# Patient Record
Sex: Female | Born: 1935
Health system: Southern US, Community
[De-identification: ages and names within clinical notes are randomized; demographics above are authoritative.]

## PROBLEM LIST (undated history)

## (undated) DIAGNOSIS — Z87442 Personal history of urinary calculi: Secondary | ICD-10-CM

## (undated) DIAGNOSIS — E785 Hyperlipidemia, unspecified: Secondary | ICD-10-CM

## (undated) DIAGNOSIS — J40 Bronchitis, not specified as acute or chronic: Secondary | ICD-10-CM

## (undated) DIAGNOSIS — I10 Essential (primary) hypertension: Secondary | ICD-10-CM

## (undated) DIAGNOSIS — H9190 Unspecified hearing loss, unspecified ear: Secondary | ICD-10-CM

## (undated) DIAGNOSIS — Z8673 Personal history of transient ischemic attack (TIA), and cerebral infarction without residual deficits: Secondary | ICD-10-CM

## (undated) DIAGNOSIS — T8859XA Other complications of anesthesia, initial encounter: Secondary | ICD-10-CM

## (undated) DIAGNOSIS — M1990 Unspecified osteoarthritis, unspecified site: Secondary | ICD-10-CM

## (undated) DIAGNOSIS — G4730 Sleep apnea, unspecified: Secondary | ICD-10-CM

## (undated) DIAGNOSIS — I82409 Acute embolism and thrombosis of unspecified deep veins of unspecified lower extremity: Secondary | ICD-10-CM

## (undated) DIAGNOSIS — E119 Type 2 diabetes mellitus without complications: Secondary | ICD-10-CM

## (undated) DIAGNOSIS — K219 Gastro-esophageal reflux disease without esophagitis: Secondary | ICD-10-CM

## (undated) HISTORY — PX: COLONOSCOPY: SHX174

## (undated) HISTORY — PX: CHOLECYSTECTOMY: SHX55

## (undated) HISTORY — DX: Bronchitis, not specified as acute or chronic: J40

## (undated) HISTORY — PX: APPENDECTOMY: SHX54

## (undated) HISTORY — PX: ABDOMINAL HYSTERECTOMY: SHX81

## (undated) HISTORY — PX: CATARACT EXTRACTION, BILATERAL: SHX1313

---

## 2018-12-07 ENCOUNTER — Encounter (HOSPITAL_COMMUNITY): Payer: Self-pay

## 2018-12-07 ENCOUNTER — Other Ambulatory Visit: Payer: Self-pay

## 2018-12-07 ENCOUNTER — Emergency Department (HOSPITAL_COMMUNITY): Payer: Medicare HMO

## 2018-12-07 ENCOUNTER — Inpatient Hospital Stay (HOSPITAL_COMMUNITY)
Admission: EM | Admit: 2018-12-07 | Discharge: 2018-12-10 | DRG: 683 | Disposition: A | Discharge status: Home / Self Care | Payer: Medicare HMO | Attending: Internal Medicine | Admitting: Internal Medicine

## 2018-12-07 DIAGNOSIS — E1169 Type 2 diabetes mellitus with other specified complication: Secondary | ICD-10-CM | POA: Diagnosis present

## 2018-12-07 DIAGNOSIS — T383X5A Adverse effect of insulin and oral hypoglycemic [antidiabetic] drugs, initial encounter: Secondary | ICD-10-CM | POA: Diagnosis present

## 2018-12-07 DIAGNOSIS — R112 Nausea with vomiting, unspecified: Secondary | ICD-10-CM

## 2018-12-07 DIAGNOSIS — I10 Essential (primary) hypertension: Secondary | ICD-10-CM | POA: Diagnosis not present

## 2018-12-07 DIAGNOSIS — K6389 Other specified diseases of intestine: Secondary | ICD-10-CM | POA: Diagnosis not present

## 2018-12-07 DIAGNOSIS — Z7982 Long term (current) use of aspirin: Secondary | ICD-10-CM

## 2018-12-07 DIAGNOSIS — K5730 Diverticulosis of large intestine without perforation or abscess without bleeding: Secondary | ICD-10-CM | POA: Diagnosis not present

## 2018-12-07 DIAGNOSIS — K5289 Other specified noninfective gastroenteritis and colitis: Secondary | ICD-10-CM | POA: Diagnosis not present

## 2018-12-07 DIAGNOSIS — K228 Other specified diseases of esophagus: Secondary | ICD-10-CM | POA: Diagnosis not present

## 2018-12-07 DIAGNOSIS — N179 Acute kidney failure, unspecified: Secondary | ICD-10-CM | POA: Diagnosis present

## 2018-12-07 DIAGNOSIS — R011 Cardiac murmur, unspecified: Secondary | ICD-10-CM | POA: Diagnosis not present

## 2018-12-07 DIAGNOSIS — Z7951 Long term (current) use of inhaled steroids: Secondary | ICD-10-CM | POA: Diagnosis not present

## 2018-12-07 DIAGNOSIS — K5641 Fecal impaction: Secondary | ICD-10-CM | POA: Diagnosis present

## 2018-12-07 DIAGNOSIS — I358 Other nonrheumatic aortic valve disorders: Secondary | ICD-10-CM | POA: Diagnosis present

## 2018-12-07 DIAGNOSIS — Z8673 Personal history of transient ischemic attack (TIA), and cerebral infarction without residual deficits: Secondary | ICD-10-CM | POA: Diagnosis not present

## 2018-12-07 DIAGNOSIS — R197 Diarrhea, unspecified: Secondary | ICD-10-CM | POA: Diagnosis present

## 2018-12-07 DIAGNOSIS — Z7984 Long term (current) use of oral hypoglycemic drugs: Secondary | ICD-10-CM | POA: Diagnosis not present

## 2018-12-07 DIAGNOSIS — Z8249 Family history of ischemic heart disease and other diseases of the circulatory system: Secondary | ICD-10-CM

## 2018-12-07 DIAGNOSIS — E872 Acidosis: Secondary | ICD-10-CM | POA: Diagnosis not present

## 2018-12-07 DIAGNOSIS — K529 Noninfective gastroenteritis and colitis, unspecified: Secondary | ICD-10-CM

## 2018-12-07 DIAGNOSIS — D649 Anemia, unspecified: Secondary | ICD-10-CM | POA: Diagnosis present

## 2018-12-07 DIAGNOSIS — K210 Gastro-esophageal reflux disease with esophagitis: Secondary | ICD-10-CM | POA: Diagnosis not present

## 2018-12-07 DIAGNOSIS — Z79899 Other long term (current) drug therapy: Secondary | ICD-10-CM | POA: Diagnosis not present

## 2018-12-07 DIAGNOSIS — Z87891 Personal history of nicotine dependence: Secondary | ICD-10-CM | POA: Diagnosis not present

## 2018-12-07 DIAGNOSIS — Z20828 Contact with and (suspected) exposure to other viral communicable diseases: Secondary | ICD-10-CM | POA: Diagnosis not present

## 2018-12-07 DIAGNOSIS — R1084 Generalized abdominal pain: Secondary | ICD-10-CM

## 2018-12-07 DIAGNOSIS — K449 Diaphragmatic hernia without obstruction or gangrene: Secondary | ICD-10-CM | POA: Diagnosis not present

## 2018-12-07 DIAGNOSIS — N200 Calculus of kidney: Secondary | ICD-10-CM | POA: Diagnosis not present

## 2018-12-07 DIAGNOSIS — E785 Hyperlipidemia, unspecified: Secondary | ICD-10-CM

## 2018-12-07 HISTORY — DX: Type 2 diabetes mellitus without complications: E11.9

## 2018-12-07 HISTORY — DX: Hyperlipidemia, unspecified: E78.5

## 2018-12-07 HISTORY — DX: Essential (primary) hypertension: I10

## 2018-12-07 LAB — COMPREHENSIVE METABOLIC PANEL
ALT: 18 U/L (ref 0–44)
AST: 26 U/L (ref 15–41)
Albumin: 3.5 g/dL (ref 3.5–5.0)
Alkaline Phosphatase: 60 U/L (ref 38–126)
Anion gap: 21 — ABNORMAL HIGH (ref 5–15)
BUN: 44 mg/dL — ABNORMAL HIGH (ref 8–23)
CO2: 21 mmol/L — ABNORMAL LOW (ref 22–32)
Calcium: 9.3 mg/dL (ref 8.9–10.3)
Chloride: 97 mmol/L — ABNORMAL LOW (ref 98–111)
Creatinine, Ser: 1.54 mg/dL — ABNORMAL HIGH (ref 0.44–1.00)
GFR calc Af Amer: 35 mL/min — ABNORMAL LOW (ref >60)
GFR calc non Af Amer: 30 mL/min — ABNORMAL LOW (ref >60)
Glucose, Bld: 102 mg/dL — ABNORMAL HIGH (ref 70–99)
Potassium: 4 mmol/L (ref 3.5–5.1)
Sodium: 139 mmol/L (ref 135–145)
Total Bilirubin: 0.9 mg/dL (ref 0.3–1.2)
Total Protein: 6.7 g/dL (ref 6.5–8.1)

## 2018-12-07 LAB — URINALYSIS, ROUTINE W REFLEX MICROSCOPIC
Bilirubin Urine: NEGATIVE
Glucose, UA: NEGATIVE mg/dL
Hgb urine dipstick: NEGATIVE
Ketones, ur: 20 mg/dL — AB
Leukocytes,Ua: NEGATIVE
Nitrite: NEGATIVE
Protein, ur: NEGATIVE mg/dL
Specific Gravity, Urine: 1.016 (ref 1.005–1.030)
pH: 5 (ref 5.0–8.0)

## 2018-12-07 LAB — GLUCOSE, CAPILLARY
Glucose-Capillary: 61 mg/dL — ABNORMAL LOW (ref 70–99)
Glucose-Capillary: 88 mg/dL (ref 70–99)

## 2018-12-07 LAB — CBC WITH DIFFERENTIAL/PLATELET
Abs Immature Granulocytes: 0.12 10*3/uL — ABNORMAL HIGH (ref 0.00–0.07)
Basophils Absolute: 0 10*3/uL (ref 0.0–0.1)
Basophils Relative: 0 %
Eosinophils Absolute: 0 10*3/uL (ref 0.0–0.5)
Eosinophils Relative: 0 %
HCT: 37.6 % (ref 36.0–46.0)
Hemoglobin: 11.8 g/dL — ABNORMAL LOW (ref 12.0–15.0)
Immature Granulocytes: 1 %
Lymphocytes Relative: 6 %
Lymphs Abs: 0.9 10*3/uL (ref 0.7–4.0)
MCH: 28.3 pg (ref 26.0–34.0)
MCHC: 31.4 g/dL (ref 30.0–36.0)
MCV: 90.2 fL (ref 80.0–100.0)
Monocytes Absolute: 0.8 10*3/uL (ref 0.1–1.0)
Monocytes Relative: 6 %
Neutro Abs: 11.9 10*3/uL — ABNORMAL HIGH (ref 1.7–7.7)
Neutrophils Relative %: 87 %
Platelets: 407 10*3/uL — ABNORMAL HIGH (ref 150–400)
RBC: 4.17 MIL/uL (ref 3.87–5.11)
RDW: 14.7 % (ref 11.5–15.5)
WBC: 13.7 10*3/uL — ABNORMAL HIGH (ref 4.0–10.5)
nRBC: 0 % (ref 0.0–0.2)

## 2018-12-07 LAB — SARS CORONAVIRUS 2 BY RT PCR (HOSPITAL ORDER, PERFORMED IN ~~LOC~~ HOSPITAL LAB): SARS Coronavirus 2: NEGATIVE

## 2018-12-07 LAB — CBG MONITORING, ED: Glucose-Capillary: 57 mg/dL — ABNORMAL LOW (ref 70–99)

## 2018-12-07 LAB — LACTIC ACID, PLASMA
Lactic Acid, Venous: 2.7 mmol/L (ref 0.5–1.9)
Lactic Acid, Venous: 1.4 mmol/L (ref 0.5–1.9)

## 2018-12-07 LAB — LIPASE, BLOOD: Lipase: 19 U/L (ref 11–51)

## 2018-12-07 LAB — TROPONIN I (HIGH SENSITIVITY): Troponin I (High Sensitivity): 10 ng/L (ref <18)

## 2018-12-07 MED ORDER — SODIUM CHLORIDE 0.9 % IV SOLN
INTRAVENOUS | Status: AC
  Administered 2018-12-07 (×2): via INTRAVENOUS

## 2018-12-07 MED ORDER — ONDANSETRON HCL 4 MG/2ML IJ SOLN
4.0000 mg | Freq: Once | INTRAMUSCULAR | Status: AC
  Administered 2018-12-07: 4 mg via INTRAVENOUS
  Filled 2018-12-07: qty 2

## 2018-12-07 MED ORDER — METRONIDAZOLE IN NACL 5-0.79 MG/ML-% IV SOLN
500.0000 mg | Freq: Once | INTRAVENOUS | Status: AC
  Administered 2018-12-07: 500 mg via INTRAVENOUS
  Filled 2018-12-07: qty 100

## 2018-12-07 MED ORDER — FAMOTIDINE 20 MG PO TABS
10.0000 mg | ORAL_TABLET | Freq: Two times a day (BID) | ORAL | Status: DC
  Administered 2018-12-07 – 2018-12-10 (×6): 10 mg via ORAL
  Filled 2018-12-07 (×5): qty 1

## 2018-12-07 MED ORDER — SODIUM CHLORIDE 0.9 % IV SOLN
2.0000 g | Freq: Once | INTRAVENOUS | Status: AC
  Administered 2018-12-07: 2 g via INTRAVENOUS
  Filled 2018-12-07: qty 20

## 2018-12-07 MED ORDER — SODIUM CHLORIDE 0.9 % IV BOLUS
1000.0000 mL | Freq: Once | INTRAVENOUS | Status: AC
  Administered 2018-12-07: 1000 mL via INTRAVENOUS

## 2018-12-07 MED ORDER — ENOXAPARIN SODIUM 40 MG/0.4ML ~~LOC~~ SOLN
40.0000 mg | Freq: Every day | SUBCUTANEOUS | Status: DC
  Administered 2018-12-07 – 2018-12-09 (×3): 40 mg via SUBCUTANEOUS
  Filled 2018-12-07 (×3): qty 0.4

## 2018-12-07 MED ORDER — IOHEXOL 300 MG/ML  SOLN
30.0000 mL | Freq: Once | INTRAMUSCULAR | Status: AC | PRN
  Administered 2018-12-07: 30 mL via ORAL

## 2018-12-07 MED ORDER — MINERAL OIL RE ENEM
1.0000 | ENEMA | Freq: Once | RECTAL | Status: AC
  Administered 2018-12-07: 23:00:00 1 via RECTAL
  Filled 2018-12-07: qty 1

## 2018-12-07 MED ORDER — ACETAMINOPHEN 325 MG PO TABS: 650.0000 mg | ORAL_TABLET | Freq: Four times a day (QID) | ORAL | Status: DC | PRN

## 2018-12-07 MED ORDER — IOHEXOL 300 MG/ML  SOLN: 30.0000 mL | Freq: Once | INTRAMUSCULAR | Status: DC | PRN

## 2018-12-07 MED ORDER — ONDANSETRON HCL 4 MG/2ML IJ SOLN
4.0000 mg | Freq: Four times a day (QID) | INTRAMUSCULAR | Status: DC | PRN
  Administered 2018-12-09: 4 mg via INTRAVENOUS
  Filled 2018-12-07: qty 2

## 2018-12-07 MED ORDER — ACETAMINOPHEN 650 MG RE SUPP: 650.0000 mg | Freq: Four times a day (QID) | RECTAL | Status: DC | PRN

## 2018-12-07 MED ORDER — INSULIN ASPART 100 UNIT/ML ~~LOC~~ SOLN
0.0000 [IU] | SUBCUTANEOUS | Status: DC
  Administered 2018-12-08: 2 [IU] via SUBCUTANEOUS
  Administered 2018-12-08: 5 [IU] via SUBCUTANEOUS
  Administered 2018-12-09 (×2): 1 [IU] via SUBCUTANEOUS
  Administered 2018-12-09: 2 [IU] via SUBCUTANEOUS
  Administered 2018-12-09 – 2018-12-10 (×2): 1 [IU] via SUBCUTANEOUS

## 2018-12-07 NOTE — ED Notes (Signed)
Report given to Shaw, [place]

## 2018-12-07 NOTE — ED Notes (Signed)
Pt daughter informed RN that pt had bowel blockage in Feb 2020, pt has hx of C. Diff x 2 years ago. Pt moved to the area 2 weeks ago from [place].

## 2018-12-07 NOTE — Progress Notes (Signed)
Patient arrived to unit @ 2126, blood glucose checked and glucose was 61. Patient asymptomatic, hypoglycemic protocol order placed and patient was given orange juice. Glucose rechecked and patient currently has CBG of 88. Patient is stable, scheduled CBG monitoring Q4. Patient will continue to be monitored. Dawson Bills, RN

## 2018-12-07 NOTE — ED Triage Notes (Signed)
Pt states she has had diarrhea since Saturday, 7/11. Pt states she has been vomiting since yesterday.

## 2018-12-07 NOTE — ED Notes (Signed)
Patient transported to CT 

## 2018-12-07 NOTE — ED Provider Notes (Signed)
[facility] DEPT Provider Note   CSN: [id number] Arrival date & time: 12/07/18  1144     History   Chief Complaint Chief Complaint  Patient presents with  . Emesis  . Diarrhea    HPI Stacey Armstrong is a 85 y.o. female.     The history is provided by the patient and medical records. No language interpreter was used.  Abdominal Pain Pain location:  Generalized Pain quality: aching, cramping and pressure   Pain radiates to:  Does not radiate Pain severity:  Severe Onset quality:  Gradual Duration:  4 days Timing:  Constant Progression:  Waxing and waning Chronicity:  New Context: previous surgery (appy years ago)   Relieved by:  Nothing Worsened by:  Nothing Ineffective treatments:  None tried Associated symptoms: chills, cough, diarrhea, fatigue, nausea and vomiting   Associated symptoms: no chest pain, no constipation, no dysuria, no fever and no shortness of breath   Risk factors: being elderly     Past Medical History:  Diagnosis Date  . Diabetes ([place])   . Hypertension     There are no active problems to display for this patient.   History reviewed. No pertinent surgical history.   OB History   No obstetric history on file.      Home Medications    Prior to Admission medications   Not on File    Family History History reviewed. No pertinent family history.  Social History Social History   Tobacco Use  . Smoking status: Not on file  Substance Use Topics  . Alcohol use: Not on file  . Drug use: Not on file     Allergies   Penicillins   Review of Systems Review of Systems  Constitutional: Positive for chills and fatigue. Negative for diaphoresis and fever.  HENT: Negative for congestion and rhinorrhea.   Respiratory: Positive for cough. Negative for chest tightness, shortness of breath, wheezing and stridor.   Cardiovascular: Negative for chest pain, palpitations and leg swelling.  Gastrointestinal:  Positive for abdominal pain, diarrhea, nausea and vomiting. Negative for constipation.  Genitourinary: Negative for dysuria, flank pain and frequency.  Musculoskeletal: Negative for back pain, neck pain and neck stiffness.  Skin: Negative for rash and wound.  Neurological: Negative for light-headedness and headaches.  Psychiatric/Behavioral: Negative for agitation.  All other systems reviewed and are negative.    Physical Exam Updated Vital Signs BP (!) 154/63 (BP Location: Right Arm)   Pulse 95   Temp 98.6 F (37 C) (Oral)   Resp 18   SpO2 98%   Physical Exam Vitals signs and nursing note reviewed.  Constitutional:      General: She is not in acute distress.    Appearance: She is well-developed. She is not ill-appearing, toxic-appearing or diaphoretic.  HENT:     Head: Normocephalic and atraumatic.     Nose: No congestion or rhinorrhea.  Eyes:     Conjunctiva/sclera: Conjunctivae normal.     Pupils: Pupils are equal, round, and reactive to light.  Neck:     Musculoskeletal: Neck supple. No muscular tenderness.  Cardiovascular:     Rate and Rhythm: Normal rate and regular rhythm.     Pulses: Normal pulses.     Heart sounds: No murmur.  Pulmonary:     Effort: Pulmonary effort is normal. No respiratory distress.     Breath sounds: Normal breath sounds. No wheezing, rhonchi or rales.  Chest:     Chest wall: No tenderness.  Abdominal:     General: Abdomen is flat.     Palpations: Abdomen is soft.     Tenderness: There is abdominal tenderness. There is no right CVA tenderness, left CVA tenderness, guarding or rebound.  Musculoskeletal:        General: No tenderness.     Right lower leg: No edema.     Left lower leg: No edema.  Skin:    General: Skin is warm and dry.     Capillary Refill: Capillary refill takes less than 2 seconds.  Neurological:     General: No focal deficit present.     Mental Status: She is alert.  Psychiatric:        Mood and Affect: Mood normal.       ED Treatments / Results  Labs (all labs ordered are listed, but only abnormal results are displayed) Labs Reviewed  LACTIC ACID, PLASMA - Abnormal; Notable for the following components:      Result Value   Lactic Acid, Venous 2.7 (*)    All other components within normal limits  CBC WITH DIFFERENTIAL/PLATELET - Abnormal; Notable for the following components:   WBC 13.7 (*)    Hemoglobin 11.8 (*)    Platelets 407 (*)    Neutro Abs 11.9 (*)    Abs Immature Granulocytes 0.12 (*)    All other components within normal limits  COMPREHENSIVE METABOLIC PANEL - Abnormal; Notable for the following components:   Chloride 97 (*)    CO2 21 (*)    Glucose, Bld 102 (*)    BUN 44 (*)    Creatinine, Ser 1.54 (*)    GFR calc non Af Amer 30 (*)    GFR calc Af Amer 35 (*)    Anion gap 21 (*)    All other components within normal limits  URINALYSIS, ROUTINE W REFLEX MICROSCOPIC - Abnormal; Notable for the following components:   Ketones, ur 20 (*)    All other components within normal limits  GLUCOSE, CAPILLARY - Abnormal; Notable for the following components:   Glucose-Capillary 61 (*)    All other components within normal limits  CBG MONITORING, ED - Abnormal; Notable for the following components:   Glucose-Capillary 57 (*)    All other components within normal limits  SARS CORONAVIRUS 2 (HOSPITAL ORDER, [facility])  URINE CULTURE  C DIFFICILE QUICK SCREEN W PCR REFLEX  GASTROINTESTINAL PANEL BY PCR, STOOL (REPLACES STOOL CULTURE)  LACTIC ACID, PLASMA  LIPASE, BLOOD  COMPREHENSIVE METABOLIC PANEL  CBC  LACTIC ACID, PLASMA  TROPONIN I (HIGH SENSITIVITY)    EKG None  Radiology Ct Abdomen Pelvis Wo Contrast  Result Date: 12/07/2018 CLINICAL DATA:  Nausea, vomiting, diarrhea for 4 days. History Clostridium difficile, prior intestinal blockage. EXAM: CT ABDOMEN AND PELVIS WITHOUT CONTRAST TECHNIQUE: Multidetector CT imaging of the abdomen and pelvis  was performed following the standard protocol without IV contrast. COMPARISON:  None. FINDINGS: Lower chest: Bandlike areas of basilar atelectasis and/or scarring. Cardiac size is enlarged. Extensive coronary calcifications are present. Mitral annular calcifications are present as well. Hepatobiliary: No concerning liver lesions. Post cholecystectomy. Prominence of biliary tree, likely related to senescent change and post cholecystectomy reservoir effect. Pancreas: Diffuse fatty replacement of the pancreas. No pancreatic ductal dilatation. No concerning pancreatic lesions. Spleen: Normal in size without focal abnormality. Adrenals/Urinary Tract: Bilateral cortical thinning, incompletely assessed on enhanced CT. Coarse 9 mm calcification in the upper pole right kidney. No obstructive urolithiasis. No hydronephrosis. Mild  symmetric bilateral perinephric stranding is nonspecific and may be age related. Stomach/Bowel: Mild distal esophageal thickening, nonspecific. Moderate hiatal hernia. Small bowel is unremarkable. Enteric contrast passes to the level of the proximal ileum. Pancolonic diverticulosis. Marked distension of the distal sigmoid and rectal vault with inspissated fecal material, this tool ball measures up to 15 cm in maximum transaxial dimension. Associated mural thickening of the distal sigmoid and rectum with faint adjacent fat stranding. Vascular/Lymphatic: Aortic atherosclerosis. No enlarged abdominal or pelvic lymph nodes. Reproductive: Uterus appears surgically absent. No concerning adnexal lesions. Other: No free fluid or free gas. No abdominal wall hernia. Minimal posterior body wall edema. Musculoskeletal: Multilevel discogenic and facet degenerative changes of the imaged thoracolumbar spine. No suspicious osseous lesions. IMPRESSION: Marked distension of the distal sigmoid and rectal vault with inspissated fecal material, consistent with fecal impaction. Associated mural thickening of the distal  sigmoid and rectum with faint adjacent fat stranding, suggestive of stercoral colitis. Colonic diverticulosis without evidence of active diverticulitis. Bilateral cortical thinning of the kidneys. Nonobstructing right nephrolithiasis. Moderate hiatal hernia. Mild nonspecific thickening of the distal esophagus, correlate with features of esophagitis or sequela from recent emesis. These results were called by telephone at the time of interpretation on 12/07/2018 at 7:08 pm to Dr. Marda Stalker , who verbally acknowledged these results. Electronically Signed   By: Lovena Le M.D.   On: 12/07/2018 19:21    Procedures Fecal disimpaction  Date/Time: 12/07/2018 10:48 PM Performed by: Courtney Paris, MD Authorized by: Courtney Paris, MD  Consent: Verbal consent obtained. Risks and benefits: risks, benefits and alternatives were discussed Consent given by: patient Imaging studies: imaging studies available Patient identity confirmed: verbally with patient and arm band Time out: Immediately prior to procedure a "time out" was called to verify the correct patient, procedure, equipment, support staff and site/side marked as required. Local anesthesia used: no  Anesthesia: Local anesthesia used: no  Sedation: Patient sedated: no  Patient tolerance: patient tolerated the procedure well with no immediate complications Comments: Unsuccessful disimpaction attempt.    (including critical care time)  Medications Ordered in ED Medications  iohexol (OMNIPAQUE) 300 MG/ML solution 30 mL ( Intravenous Canceled Entry 12/07/18 1810)  enoxaparin (LOVENOX) injection 40 mg (40 mg Subcutaneous Given 12/07/18 2235)  0.9 %  sodium chloride infusion ( Intravenous New Bag/Given 12/07/18 2144)  acetaminophen (TYLENOL) tablet 650 mg (has no administration in time range)    Or  acetaminophen (TYLENOL) suppository 650 mg (has no administration in time range)  insulin aspart (novoLOG) injection 0-9  Units (0 Units Subcutaneous Not Given 12/07/18 2234)  ondansetron (ZOFRAN) injection 4 mg (has no administration in time range)  famotidine (PEPCID) tablet 10 mg (10 mg Oral Given 12/07/18 2235)  sodium chloride 0.9 % bolus 1,000 mL (0 mLs Intravenous Stopped 12/07/18 1501)  ondansetron (ZOFRAN) injection 4 mg (4 mg Intravenous Given 12/07/18 1314)  iohexol (OMNIPAQUE) 300 MG/ML solution 30 mL (30 mLs Oral Contrast Given 12/07/18 1600)  cefTRIAXone (ROCEPHIN) 2 g in sodium chloride 0.9 % 100 mL IVPB (2 g Intravenous New Bag/Given 12/07/18 2007)    And  metroNIDAZOLE (FLAGYL) IVPB 500 mg (500 mg Intravenous New Bag/Given 12/07/18 2102)  mineral oil enema 1 enema (1 enema Rectal Given 12/07/18 2235)    Stacey Armstrong was evaluated in Emergency Department on 12/07/2018 for the symptoms described in the history of present illness. She was evaluated in the context of the global COVID-19 pandemic, which necessitated consideration that the patient might be  at risk for infection with the SARS-CoV-2 virus that causes COVID-19. Institutional protocols and algorithms that pertain to the evaluation of patients at risk for COVID-19 are in a state of rapid change based on information released by regulatory bodies including the CDC and federal and state organizations. These policies and algorithms were followed during the patient's care in the ED.  CRITICAL CARE Performed by: Gwenyth Allegra Tegeler Total critical care time: 35 minutes Critical care time was exclusive of separately billable procedures and treating other patients. Critical care was necessary to treat or prevent imminent or life-threatening deterioration. Critical care was time spent personally by me on the following activities: development of treatment plan with patient and/or surrogate as well as nursing, discussions with consultants, evaluation of patient's response to treatment, examination of patient, obtaining history from patient or surrogate, ordering  and performing treatments and interventions, ordering and review of laboratory studies, ordering and review of radiographic studies, pulse oximetry and re-evaluation of patient's condition.   Initial Impression / Assessment and Plan / ED Course  I have reviewed the triage vital signs and the nursing notes.  Pertinent labs & imaging results that were available during my care of the patient were reviewed by me and considered in my medical decision making (see chart for details).        Stacey Armstrong is a 85 y.o. female with a past medical history significant for hypertension diabetes who presents with nausea, vomiting, diarrhea, abdominal pain, and cough.  Patient reports that for the last several days she is been having symptoms.  She reports that 4 days of diarrhea is been numerous.  It is nonbloody.  She reports since yesterday she did have vomiting has not kept any fluids down.  She reports she is having abdominal pain is been worsening in the interim that she describes as severe.  She has never had this pain before.  She reports she has had fatigue and feeling drained but has had no documented fevers at home.  She does report she is had some chills.  She reports has had some nonproductive cough.  She denies chest pain or shortness of breath.  She lives alone and denies any known coronavirus contacts.  She denies other complaints at this time.  On exam, abdomen is tender to palpation.  Bowel sounds were appreciated.  No CVA or flank tenderness.  Lungs clear and chest was nontender.  Patient was nauseous as I was examining her.  Suspect viral gastroenteritis given the nausea vomiting, diarrhea, bowel discomfort however with her tenderness on exam, will obtain CT imaging to rule out other pathology at this time.  Will get urinalysis as well as chest x-ray and will check her for coronavirus given the ongoing current pandemic and a possible that she may need admission if she is intolerant of p.o.   Initially, her blood pressure was around 789 systolic which appears to be less than her baseline.  She will be given fluids and remain n.p.o. initially.  Anticipate reassessment.  7:43 PM CT scan shows concern for development of likely stercoral colitis in the setting of large stool impaction.  No evidence of perforation at this time.  Labs showed no UTI, lactic acid improving after fluids.  Coronavirus test negative.  Patient does have a leukocytosis and mild anemia.  Lipase normal.  Creatinine elevated 1.5.  Manual disimpaction was attempted and unfortunately could not get a large amount of stool out.  The large stool ball is farther proximally then could  easily be disimpacted.  Due to her continued impaction, the development of stercoral colitis, her leukocytosis, lactic acidosis, elevated creatinine, and continued abdominal discomfort, patient will be given antibiotics and admitted to the hospital.  Spoke with daughter who is the healthcare power attorney who agreed with this plan of care.  Patient reports she has taken Keflex in the past so after speaking with pharmacy composition made to give a cephalosporin and Flagyl for antibiotic treatment for the stercoral colitis.  Patient will be admitted for further management.   Final Clinical Impressions(s) / ED Diagnoses   Final diagnoses:  Generalized abdominal pain  Colitis  Diarrhea, unspecified type  Nausea and vomiting, intractability of vomiting not specified, unspecified vomiting type    ED Discharge Orders    None      Clinical Impression: 1. Generalized abdominal pain   2. Colitis   3. Diarrhea, unspecified type   4. Nausea and vomiting, intractability of vomiting not specified, unspecified vomiting type     Disposition: Admit  This note was prepared with assistance of Dragon voice recognition software. Occasional wrong-word or sound-a-like substitutions may have occurred due to the inherent limitations of voice  recognition software.     Tegeler, Gwenyth Allegra, MD 12/07/18 2249

## 2018-12-07 NOTE — H&P (Addendum)
TRH H&P    Patient Demographics:    Stacey Armstrong, is a 85 y.o. female  MRN: [id number]  DOB - 14-Jul-1927  Admit Date - 12/07/2018  Referring MD/NP/PA: Gerald Stabs Tegeler  Outpatient Primary MD for the patient is Patient, No Pcp Per No PCP, just moved from [facility]  Patient coming from:  home  Chief complaint-  diarrhea   HPI:    Stacey Armstrong  is a 85 y.o. female, w hypertension, hyperlipidemia, dm2, h/o TIA?,  C. Diff circa 2018/2019, apparently presents with diarrhea, loose stool since Saturday.  Pt having multiple episodes 3-4 per day.  Pt noted n/v, for the past 1-2 days.  No hematemesis. Slight lower abdominal cramping per pt.    Pt denies fever, chills, cough, cp, palp, sob, brbpr, dysuria, hematuria.  Pt states had colonoscopy in the remote past and was negative.    Pt presented due to diarrhea.   In ED T 98.6, P 102,  curerntly 86,  Bp 154/63  Pox 97% on RA  CT abd/ pelvis IMPRESSION: Marked distension of the distal sigmoid and rectal vault with inspissated fecal material, consistent with fecal impaction. Associated mural thickening of the distal sigmoid and rectum with faint adjacent fat stranding, suggestive of stercoral colitis.  Colonic diverticulosis without evidence of active diverticulitis.  Bilateral cortical thinning of the kidneys. Nonobstructing right nephrolithiasis.  Moderate hiatal hernia.  Mild nonspecific thickening of the distal esophagus, correlate with features of esophagitis or sequela from recent emesis.  Lactic acid 2.7   Wbc 13.7, hgb 11.8, Plt 407 Na 139, K 4.0  Bun 44, Creatinine 1.54 Hco3 21, AG 21 Ast 26, Alt 18 Lipase 19 Urinalysis negative  covid 19 negative  Pt received rocephin 2gm iv and flagyl $RemoveBe'500mg'lvxbUbKgv$  iv while in ED.   Pt will be admitted for n/v, diarrhea, and fecal impaction and mild ARF, and lactic acidosis secondary to metformin.    Review  of systems:    In addition to the HPI above,  No Fever-chills, No Headache, No changes with Vision or hearing, No problems swallowing food or Liquids, No Chest pain, Cough or Shortness of Breath,   No Blood in stool or Urine, No dysuria, No new skin rashes or bruises, No new joints pains-aches,  No new weakness, tingling, numbness in any extremity, No recent weight gain or loss, No polyuria, polydypsia or polyphagia, No significant Mental Stressors.  All other systems reviewed and are negative.    Past History of the following :    Past Medical History:  Diagnosis Date   Diabetes ([place])    Hyperlipidemia    Hypertension       Past Surgical History:  Procedure Laterality Date   APPENDECTOMY     CHOLECYSTECTOMY        Social History:      Social History   Tobacco Use   Smoking status: Former Smoker   Smokeless tobacco: Never Used  Substance Use Topics   Alcohol use: Not Currently       Family History :  Family History  Problem Relation Age of Onset   CAD Maternal Grandmother    Pneumonia Maternal Grandfather        Home Medications:   Prior to Admission medications   Medication Sig Start Date End Date Taking? Authorizing Provider  amitriptyline (ELAVIL) 25 MG tablet Take 25 mg by mouth at bedtime.   Yes [provider]  amLODipine (NORVASC) 5 MG tablet Take 5 mg by mouth daily.   Yes [provider]  aspirin EC 81 MG tablet Take 81 mg by mouth daily.   Yes [provider]  atorvastatin (LIPITOR) 20 MG tablet Take 20 mg by mouth daily.   Yes [provider]  bisoprolol-hydrochlorothiazide (ZIAC) 2.5-6.25 MG tablet Take 1 tablet by mouth daily.   Yes [provider]  fluticasone (FLONASE) 50 MCG/ACT nasal spray Place 1 spray into both nostrils 2 (two) times a day.   Yes [provider]  glipiZIDE (GLUCOTROL XL) 10 MG 24 hr tablet Take 10 mg by mouth daily with breakfast.   Yes  [provider]  ibuprofen (ADVIL) 200 MG tablet Take 200 mg by mouth every 6 (six) hours as needed for moderate pain.   Yes [provider]  losartan (COZAAR) 100 MG tablet Take 100 mg by mouth daily.   Yes [provider]  metFORMIN (GLUCOPHAGE) 1000 MG tablet Take 1,000 mg by mouth 2 (two) times daily with a meal.   Yes [provider]  Multiple Vitamins-Minerals (CENTRUM SILVER 50+WOMEN) TABS Take 1 tablet by mouth daily.   Yes [provider]  Omega-3 Fatty Acids (FISH OIL) 1200 MG CAPS Take 1,000 mg by mouth 2 (two) times a day.   Yes [provider]  OVER THE COUNTER MEDICATION Take 1 tablet by mouth daily. **Probiotic**   Yes [provider]  Polyethyl Glycol-Propyl Glycol (SYSTANE ULTRA OP) Place 1 drop into both eyes as needed (dry eyes).   Yes [provider]  polyethylene glycol (MIRALAX / GLYCOLAX) 17 g packet Take 17 g by mouth as needed for mild constipation.   Yes [provider]  VITAMIN D, CHOLECALCIFEROL, PO Take 2,000 Units by mouth daily.   Yes [provider]     Allergies:     Allergies  Allergen Reactions   Penicillins Other (See Comments)    Unknown to pt daughter      Physical Exam:   Vitals  Blood pressure (!) 142/43, pulse 86, temperature 98.6 F (37 C), temperature source Oral, resp. rate 17, SpO2 97 %.  1.  General: axoxo3  2. Psychiatric: euthymic  3. Neurologic: cn2-12 intact, reflexes 2+ symmetric, diffuse with no clonus, motor 5/5 in all 4 ext  4. HEENMT:  Anicteric, pupils 1.36mm symmetric, direct consensual, intact  5. Respiratory : CTAB  6. Cardiovascular : rrr s1, s2, 2/6 sem rusb with radiation to next, also slight murmer at apex  7. Gastrointestinal:  Abd: soft, obese, nt, nd, +bs  8. Skin:  Ext: no c/c/e,  No rash  9.Musculoskeletal:  Good ROM  No adenopathy    Data Review:    CBC Recent Labs  Lab 12/07/18 1232  WBC 13.7*    HGB 11.8*  HCT 37.6  PLT 407*  MCV 90.2  MCH 28.3  MCHC 31.4  RDW 14.7  LYMPHSABS 0.9  MONOABS 0.8  EOSABS 0.0  BASOSABS 0.0   ------------------------------------------------------------------------------------------------------------------  Results for orders placed or performed during the hospital encounter of 12/07/18 (from the past 48 hour(s))  Lactic acid, plasma     Status: Abnormal   Collection Time: 12/07/18 12:32 PM  Result Value Ref Range   Lactic Acid, Venous 2.7 (HH) 0.5 - 1.9 mmol/L    Comment: CRITICAL RESULT CALLED TO, READ BACK BY AND VERIFIED WITHVernon Prey RN AT 4098 12/07/18 MULLINS,T Performed at [facility], [place] [address], [place], [place] [address]   CBC with Differential     Status: Abnormal   Collection Time: 12/07/18 12:32 PM  Result Value Ref Range   WBC 13.7 (H) 4.0 - 10.5 K/uL   RBC 4.17 3.87 - 5.11 MIL/uL   Hemoglobin 11.8 (L) 12.0 - 15.0 g/dL   HCT 37.6 36.0 - 46.0 %   MCV 90.2 80.0 - 100.0 fL   MCH 28.3 26.0 - 34.0 pg   MCHC 31.4 30.0 - 36.0 g/dL   RDW 14.7 11.5 - 15.5 %   Platelets 407 (H) 150 - 400 K/uL   nRBC 0.0 0.0 - 0.2 %   Neutrophils Relative % 87 %   Neutro Abs 11.9 (H) 1.7 - 7.7 K/uL   Lymphocytes Relative 6 %   Lymphs Abs 0.9 0.7 - 4.0 K/uL   Monocytes Relative 6 %   Monocytes Absolute 0.8 0.1 - 1.0 K/uL   Eosinophils Relative 0 %   Eosinophils Absolute 0.0 0.0 - 0.5 K/uL   Basophils Relative 0 %   Basophils Absolute 0.0 0.0 - 0.1 K/uL   Immature Granulocytes 1 %   Abs Immature Granulocytes 0.12 (H) 0.00 - 0.07 K/uL    Comment: Performed at [facility], [place] [address] [place], [place] [address]  Comprehensive metabolic panel     Status: Abnormal   Collection Time: 12/07/18 12:32 PM  Result Value Ref Range   Sodium 139 135 - 145 mmol/L   Potassium 4.0 3.5 - 5.1 mmol/L   Chloride 97 (L) 98 - 111 mmol/L   CO2 21 (L) 22 - 32 mmol/L   Glucose, Bld 102 (H) 70 - 99 mg/dL   BUN 44  (H) 8 - 23 mg/dL   Creatinine, Ser 1.54 (H) 0.44 - 1.00 mg/dL   Calcium 9.3 8.9 - 10.3 mg/dL   Total Protein 6.7 6.5 - 8.1 g/dL   Albumin 3.5 3.5 - 5.0 g/dL   AST 26 15 - 41 U/L   ALT 18 0 - 44 U/L   Alkaline Phosphatase 60 38 - 126 U/L   Total Bilirubin 0.9 0.3 - 1.2 mg/dL   GFR calc non Af Amer 30 (L) >60 mL/min   GFR calc Af Amer 35 (L) >60 mL/min   Anion gap 21 (H) 5 - 15    Comment: REPEATED TO VERIFY Performed at [place] [address] [place], [place] [address]   Lipase, blood     Status: None   Collection Time: 12/07/18 12:32 PM  Result Value Ref Range   Lipase 19 11 - 51 U/L    Comment: Performed at [facility], [place] [address] [place], [place] [address]  SARS Coronavirus 2 (CEPHEID - Performed in [place] hospital lab), Hosp Order     Status: None   Collection Time: 12/07/18  1:11 PM   Specimen: Nasopharyngeal Swab  Result Value Ref Range   SARS Coronavirus 2 NEGATIVE NEGATIVE    Comment: (NOTE) If result is NEGATIVE SARS-CoV-2 target nucleic acids are NOT DETECTED. The SARS-CoV-2 RNA is generally detectable in upper and lower  respiratory specimens during the  acute phase of infection. The lowest  concentration of SARS-CoV-2 viral copies this assay can detect is 250  copies / mL. A negative result does not preclude SARS-CoV-2 infection  and should not be used as the sole basis for treatment or other  patient management decisions.  A negative result may occur with  improper specimen collection / handling, submission of specimen other  than nasopharyngeal swab, presence of viral mutation(s) within the  areas targeted by this assay, and inadequate number of viral copies  (<250 copies / mL). A negative result must be combined with clinical  observations, patient history, and epidemiological information. If result is POSITIVE SARS-CoV-2 target nucleic acids are DETECTED. The SARS-CoV-2 RNA is generally detectable in upper  and lower  respiratory specimens dur ing the acute phase of infection.  Positive  results are indicative of active infection with SARS-CoV-2.  Clinical  correlation with patient history and other diagnostic information is  necessary to determine patient infection status.  Positive results do  not rule out bacterial infection or co-infection with other viruses. If result is PRESUMPTIVE POSTIVE SARS-CoV-2 nucleic acids MAY BE PRESENT.   A presumptive positive result was obtained on the submitted specimen  and confirmed on repeat testing.  While 2019 novel coronavirus  (SARS-CoV-2) nucleic acids may be present in the submitted sample  additional confirmatory testing may be necessary for epidemiological  and / or clinical management purposes  to differentiate between  SARS-CoV-2 and other Sarbecovirus currently known to infect humans.  If clinically indicated additional testing with an alternate test  methodology [phone]) is advised. The SARS-CoV-2 RNA is generally  detectable in upper and lower respiratory sp ecimens during the acute  phase of infection. The expected result is Negative. Fact Sheet for Patients:  BoilerBrush.com.cy Fact Sheet for Healthcare Providers: https://pope.com/ This test is not yet approved or cleared by the [place] FDA and has been authorized for detection and/or diagnosis of SARS-CoV-2 by FDA under an Emergency Use Authorization (EUA).  This EUA will remain in effect (meaning this test can be used) for the duration of the COVID-19 declaration under Section 564(b)(1) of the Act, 21 U.S.C. section 360bbb-3(b)(1), unless the authorization is terminated or revoked sooner. Performed at [facility], [address], [place], [place] [address]   Lactic acid, plasma     Status: None   Collection Time: 12/07/18  2:55 PM  Result Value Ref Range   Lactic Acid, Venous 1.4 0.5 - 1.9 mmol/L     Comment: Performed at [facility], [address] [place], [place] [address]  Urinalysis, Routine w reflex microscopic     Status: Abnormal   Collection Time: 12/07/18  5:54 PM  Result Value Ref Range   Color, Urine YELLOW YELLOW   APPearance CLEAR CLEAR   Specific Gravity, Urine 1.016 1.005 - 1.030   pH 5.0 5.0 - 8.0   Glucose, UA NEGATIVE NEGATIVE mg/dL   Hgb urine dipstick NEGATIVE NEGATIVE   Bilirubin Urine NEGATIVE NEGATIVE   Ketones, ur 20 (A) NEGATIVE mg/dL   Protein, ur NEGATIVE NEGATIVE mg/dL   Nitrite NEGATIVE NEGATIVE   Leukocytes,Ua NEGATIVE NEGATIVE    Comment: Performed at [facility], [address], [place], [place] [address]  CBG monitoring, ED     Status: Abnormal   Collection Time: 12/07/18  8:44 PM  Result Value Ref Range   Glucose-Capillary 57 (L) 70 - 99 mg/dL    Chemistries  Recent Labs  Lab  12/07/18 1232  NA 139  K 4.0  CL 97*  CO2 21*  GLUCOSE 102*  BUN 44*  CREATININE 1.54*  CALCIUM 9.3  AST 26  ALT 18  ALKPHOS 60  BILITOT 0.9   ------------------------------------------------------------------------------------------------------------------  ------------------------------------------------------------------------------------------------------------------ GFR: CrCl cannot be calculated (Unknown ideal weight.). Liver Function Tests: Recent Labs  Lab 12/07/18 1232  AST 26  ALT 18  ALKPHOS 60  BILITOT 0.9  PROT 6.7  ALBUMIN 3.5   Recent Labs  Lab 12/07/18 1232  LIPASE 19   No results for input(s): AMMONIA in the last 168 hours. Coagulation Profile: No results for input(s): INR, PROTIME in the last 168 hours. Cardiac Enzymes: No results for input(s): CKTOTAL, CKMB, CKMBINDEX, TROPONINI in the last 168 hours. BNP (last 3 results) No results for input(s): PROBNP in the last 8760 hours. HbA1C: No results for input(s): HGBA1C in the last 72 hours. CBG: Recent Labs  Lab 12/07/18 2044    GLUCAP 57*   Lipid Profile: No results for input(s): CHOL, HDL, LDLCALC, TRIG, CHOLHDL, LDLDIRECT in the last 72 hours. Thyroid Function Tests: No results for input(s): TSH, T4TOTAL, FREET4, T3FREE, THYROIDAB in the last 72 hours. Anemia Panel: No results for input(s): VITAMINB12, FOLATE, FERRITIN, TIBC, IRON, RETICCTPCT in the last 72 hours.  --------------------------------------------------------------------------------------------------------------- Urine analysis:    Component Value Date/Time   COLORURINE YELLOW 12/07/2018 1754   APPEARANCEUR CLEAR 12/07/2018 1754   LABSPEC 1.016 12/07/2018 1754   PHURINE 5.0 12/07/2018 1754   GLUCOSEU NEGATIVE 12/07/2018 1754   HGBUR NEGATIVE 12/07/2018 1754   BILIRUBINUR NEGATIVE 12/07/2018 1754   KETONESUR 20 (A) 12/07/2018 1754   PROTEINUR NEGATIVE 12/07/2018 1754   NITRITE NEGATIVE 12/07/2018 1754   LEUKOCYTESUR NEGATIVE 12/07/2018 1754      Imaging Results:    Ct Abdomen Pelvis Wo Contrast  Result Date: 12/07/2018 CLINICAL DATA:  Nausea, vomiting, diarrhea for 4 days. History Clostridium difficile, prior intestinal blockage. EXAM: CT ABDOMEN AND PELVIS WITHOUT CONTRAST TECHNIQUE: Multidetector CT imaging of the abdomen and pelvis was performed following the standard protocol without IV contrast. COMPARISON:  None. FINDINGS: Lower chest: Bandlike areas of basilar atelectasis and/or scarring. Cardiac size is enlarged. Extensive coronary calcifications are present. Mitral annular calcifications are present as well. Hepatobiliary: No concerning liver lesions. Post cholecystectomy. Prominence of biliary tree, likely related to senescent change and post cholecystectomy reservoir effect. Pancreas: Diffuse fatty replacement of the pancreas. No pancreatic ductal dilatation. No concerning pancreatic lesions. Spleen: Normal in size without focal abnormality. Adrenals/Urinary Tract: Bilateral cortical thinning, incompletely assessed on enhanced CT.  Coarse 9 mm calcification in the upper pole right kidney. No obstructive urolithiasis. No hydronephrosis. Mild symmetric bilateral perinephric stranding is nonspecific and may be age related. Stomach/Bowel: Mild distal esophageal thickening, nonspecific. Moderate hiatal hernia. Small bowel is unremarkable. Enteric contrast passes to the level of the proximal ileum. Pancolonic diverticulosis. Marked distension of the distal sigmoid and rectal vault with inspissated fecal material, this tool ball measures up to 15 cm in maximum transaxial dimension. Associated mural thickening of the distal sigmoid and rectum with faint adjacent fat stranding. Vascular/Lymphatic: Aortic atherosclerosis. No enlarged abdominal or pelvic lymph nodes. Reproductive: Uterus appears surgically absent. No concerning adnexal lesions. Other: No free fluid or free gas. No abdominal wall hernia. Minimal posterior body wall edema. Musculoskeletal: Multilevel discogenic and facet degenerative changes of the imaged thoracolumbar spine. No suspicious osseous lesions. IMPRESSION: Marked distension of the distal sigmoid and rectal vault with inspissated fecal material, consistent with fecal impaction. Associated mural  thickening of the distal sigmoid and rectum with faint adjacent fat stranding, suggestive of stercoral colitis. Colonic diverticulosis without evidence of active diverticulitis. Bilateral cortical thinning of the kidneys. Nonobstructing right nephrolithiasis. Moderate hiatal hernia. Mild nonspecific thickening of the distal esophagus, correlate with features of esophagitis or sequela from recent emesis. These results were called by telephone at the time of interpretation on 12/07/2018 at 7:08 pm to Dr. Marda Stalker , who verbally acknowledged these results. Electronically Signed   By: Lovena Le M.D.   On: 12/07/2018 19:21        Assessment & Plan:    Active Problems:   Fecal impaction (HCC)   Type 2 diabetes mellitus  with hyperlipidemia (HCC)   Essential hypertension   Diarrhea   Nausea and vomiting   ARF (acute renal failure) (HCC)  Diarrhea,  Likely secondary to fecal impaction.   ddx C. Diff (pt has history of C. Diff) Check stool for C. Diff, GI pathogen panel Hold Metformin-> could contribute to diarrhea  Fecal impaction w stercoral colitis Mineral oil enema Cont Miralax  RN to try to disempact  N/v Check CXR r/o aspiration Check trop I, 12 lead ekg zofran $RemoveBe'4mg'JCrAzOGND$  iv q6h prn  ARF Hold Losartan STOP Ibuprofen Hydrate with ns at 26mL per hour x 12  Hours Check cmp in am  Dm2 STOP Metformin due to ARF, and lactic acidosis Hold Glipizide fsbs q4h, ISS  Hypertension Hold Losartan Cont Bisoprolol-hydrochlorothiazide Cont Amlodipine $RemoveBeforeD'5mg'XnZHFuCnMhRRkp$  po qday  Hyperlipidemia, h/o TIA Cont Lipitor $RemoveBefo'20mg'ihjOzDkScAa$  po qhs Cont Aspirin $RemoveBefo'81mg'ZhgmSiQcGHy$  po qday  ? Gerd/ eosphagitis on CT scan Start Pepcid $RemoveBefo'10mg'uvWmckyYVxy$  po bid  Constipation Cont Miralax 17gm po qday  Cardiac murmer Check cardiac echo   DVT Prophylaxis-   Lovenox - SCDs   AM Labs Ordered, also please review Full Orders  Family Communication: Admission, patients condition and plan of care including tests being ordered have been discussed with the patient  who indicate understanding and agree with the plan and Code Status.  Code Status:  FULL CODE,  Notified daughter Ms. Justice regarding patient admission to [facility]  Admission status: Observation : Based on patients clinical presentation and evaluation of above clinical data, I have made determination that patient meets observation criteria at this time.  Depending upon results of stool studies as well as how fast she improves clnically, may require change to inpatient admission.   Time spent in minutes : 70 minutes   Jani Gravel M.D on 12/07/2018 at 8:56 PM

## 2018-12-08 ENCOUNTER — Inpatient Hospital Stay (HOSPITAL_COMMUNITY): Payer: Medicare HMO

## 2018-12-08 DIAGNOSIS — K210 Gastro-esophageal reflux disease with esophagitis: Secondary | ICD-10-CM | POA: Diagnosis not present

## 2018-12-08 DIAGNOSIS — K5641 Fecal impaction: Secondary | ICD-10-CM | POA: Diagnosis not present

## 2018-12-08 DIAGNOSIS — D649 Anemia, unspecified: Secondary | ICD-10-CM | POA: Diagnosis not present

## 2018-12-08 DIAGNOSIS — Z7951 Long term (current) use of inhaled steroids: Secondary | ICD-10-CM | POA: Diagnosis not present

## 2018-12-08 DIAGNOSIS — E785 Hyperlipidemia, unspecified: Secondary | ICD-10-CM | POA: Diagnosis not present

## 2018-12-08 DIAGNOSIS — K5289 Other specified noninfective gastroenteritis and colitis: Secondary | ICD-10-CM | POA: Diagnosis not present

## 2018-12-08 DIAGNOSIS — N179 Acute kidney failure, unspecified: Secondary | ICD-10-CM | POA: Diagnosis not present

## 2018-12-08 DIAGNOSIS — T383X5A Adverse effect of insulin and oral hypoglycemic [antidiabetic] drugs, initial encounter: Secondary | ICD-10-CM | POA: Diagnosis present

## 2018-12-08 DIAGNOSIS — K529 Noninfective gastroenteritis and colitis, unspecified: Secondary | ICD-10-CM

## 2018-12-08 DIAGNOSIS — Z79899 Other long term (current) drug therapy: Secondary | ICD-10-CM | POA: Diagnosis not present

## 2018-12-08 DIAGNOSIS — I10 Essential (primary) hypertension: Secondary | ICD-10-CM | POA: Diagnosis not present

## 2018-12-08 DIAGNOSIS — Z7984 Long term (current) use of oral hypoglycemic drugs: Secondary | ICD-10-CM | POA: Diagnosis not present

## 2018-12-08 DIAGNOSIS — R1084 Generalized abdominal pain: Secondary | ICD-10-CM | POA: Diagnosis not present

## 2018-12-08 DIAGNOSIS — R011 Cardiac murmur, unspecified: Secondary | ICD-10-CM | POA: Diagnosis not present

## 2018-12-08 DIAGNOSIS — E1169 Type 2 diabetes mellitus with other specified complication: Secondary | ICD-10-CM | POA: Diagnosis not present

## 2018-12-08 DIAGNOSIS — Z8249 Family history of ischemic heart disease and other diseases of the circulatory system: Secondary | ICD-10-CM | POA: Diagnosis not present

## 2018-12-08 DIAGNOSIS — Z87891 Personal history of nicotine dependence: Secondary | ICD-10-CM | POA: Diagnosis not present

## 2018-12-08 DIAGNOSIS — Z20828 Contact with and (suspected) exposure to other viral communicable diseases: Secondary | ICD-10-CM | POA: Diagnosis not present

## 2018-12-08 DIAGNOSIS — Z8673 Personal history of transient ischemic attack (TIA), and cerebral infarction without residual deficits: Secondary | ICD-10-CM | POA: Diagnosis not present

## 2018-12-08 DIAGNOSIS — R197 Diarrhea, unspecified: Secondary | ICD-10-CM | POA: Diagnosis not present

## 2018-12-08 DIAGNOSIS — I358 Other nonrheumatic aortic valve disorders: Secondary | ICD-10-CM | POA: Diagnosis not present

## 2018-12-08 DIAGNOSIS — E872 Acidosis: Secondary | ICD-10-CM | POA: Diagnosis not present

## 2018-12-08 DIAGNOSIS — Z7982 Long term (current) use of aspirin: Secondary | ICD-10-CM | POA: Diagnosis not present

## 2018-12-08 LAB — COMPREHENSIVE METABOLIC PANEL
ALT: 16 U/L (ref 0–44)
AST: 20 U/L (ref 15–41)
Albumin: 2.7 g/dL — ABNORMAL LOW (ref 3.5–5.0)
Alkaline Phosphatase: 42 U/L (ref 38–126)
Anion gap: 13 (ref 5–15)
BUN: 32 mg/dL — ABNORMAL HIGH (ref 8–23)
CO2: 23 mmol/L (ref 22–32)
Calcium: 7.9 mg/dL — ABNORMAL LOW (ref 8.9–10.3)
Chloride: 103 mmol/L (ref 98–111)
Creatinine, Ser: 0.92 mg/dL (ref 0.44–1.00)
GFR calc Af Amer: >60 mL/min (ref >60)
GFR calc non Af Amer: 56 mL/min — ABNORMAL LOW (ref >60)
Glucose, Bld: 75 mg/dL (ref 70–99)
Potassium: 3 mmol/L — ABNORMAL LOW (ref 3.5–5.1)
Sodium: 139 mmol/L (ref 135–145)
Total Bilirubin: 0.7 mg/dL (ref 0.3–1.2)
Total Protein: 5.3 g/dL — ABNORMAL LOW (ref 6.5–8.1)

## 2018-12-08 LAB — CBC
HCT: 31.7 % — ABNORMAL LOW (ref 36.0–46.0)
Hemoglobin: 10.2 g/dL — ABNORMAL LOW (ref 12.0–15.0)
MCH: 28.8 pg (ref 26.0–34.0)
MCHC: 32.2 g/dL (ref 30.0–36.0)
MCV: 89.5 fL (ref 80.0–100.0)
Platelets: 314 10*3/uL (ref 150–400)
RBC: 3.54 MIL/uL — ABNORMAL LOW (ref 3.87–5.11)
RDW: 14.7 % (ref 11.5–15.5)
WBC: 12.1 10*3/uL — ABNORMAL HIGH (ref 4.0–10.5)
nRBC: 0 % (ref 0.0–0.2)

## 2018-12-08 LAB — GLUCOSE, CAPILLARY
Glucose-Capillary: 67 mg/dL — ABNORMAL LOW (ref 70–99)
Glucose-Capillary: 104 mg/dL — ABNORMAL HIGH (ref 70–99)
Glucose-Capillary: 116 mg/dL — ABNORMAL HIGH (ref 70–99)
Glucose-Capillary: 100 mg/dL — ABNORMAL HIGH (ref 70–99)
Glucose-Capillary: 152 mg/dL — ABNORMAL HIGH (ref 70–99)
Glucose-Capillary: 287 mg/dL — ABNORMAL HIGH (ref 70–99)

## 2018-12-08 LAB — URINE CULTURE: Culture: NO GROWTH

## 2018-12-08 LAB — ECHOCARDIOGRAM COMPLETE: Height: 64 in

## 2018-12-08 LAB — LACTIC ACID, PLASMA: Lactic Acid, Venous: 0.7 mmol/L (ref 0.5–1.9)

## 2018-12-08 MED ORDER — SODIUM CHLORIDE 0.9 % IV SOLN
INTRAVENOUS | Status: AC
  Administered 2018-12-08: 12:00:00 via INTRAVENOUS

## 2018-12-08 MED ORDER — WITCH HAZEL-GLYCERIN EX PADS
MEDICATED_PAD | CUTANEOUS | Status: DC | PRN
  Filled 2018-12-08: qty 100

## 2018-12-08 MED ORDER — SORBITOL 70 % SOLN
960.0000 mL | TOPICAL_OIL | Freq: Once | ORAL | Status: AC
  Administered 2018-12-08: 960 mL via RECTAL
  Filled 2018-12-08: qty 473

## 2018-12-08 MED ORDER — PEG 3350-KCL-NA BICARB-NACL 420 G PO SOLR
4000.0000 mL | Freq: Once | ORAL | Status: AC
  Administered 2018-12-08: 4000 mL via ORAL

## 2018-12-08 NOTE — Progress Notes (Signed)
  Echocardiogram 2D Echocardiogram has been performed.  [name] 12/08/2018, 3:16 PM

## 2018-12-08 NOTE — Progress Notes (Signed)
PROGRESS NOTE    Stacey Armstrong  WUJ:[id number] DOB: 1927-10-04 DOA: 12/07/2018 PCP: Patient, No Pcp Per    Brief Narrative:  85 y.o. female, w hypertension, hyperlipidemia, dm2, h/o TIA?,  C. Diff circa 2018/2019, apparently presents with diarrhea, loose stool since Saturday.  Pt having multiple episodes 3-4 per day.  Pt noted n/v, for the past 1-2 days.  No hematemesis. Slight lower abdominal cramping per pt.    Pt denies fever, chills, cough, cp, palp, sob, brbpr, dysuria, hematuria.  Pt states had colonoscopy in the remote past and was negative.    Pt presented due to diarrhea.   Assessment & Plan:   Active Problems:   Fecal impaction (HCC)   Type 2 diabetes mellitus with hyperlipidemia (HCC)   Essential hypertension   Diarrhea   Nausea and vomiting   ARF (acute renal failure) (HCC)  Diarrhea,  Likely secondary to fecal impaction.   ddx C. Diff (pt has history of C. Diff) Cfiff was ordered at time of presenation given prior hx of Cdiff Metformin on hold per below CT abd personally reviewed. Marked amount of stool distally filling the majority of the pelvic region Given trial of manual disimpaction, SMOG enema, and golytely without significant improvement If still no result by AM, would consider consulting GI at that time  Fecal impaction w stercoral colitis Per above, no improvement despite multiple cathartics Likely consult GI in AM if still no results by then  N/v Likely secondary to marked constipation Plan per above  ARF Hold Losartan STOPPED Ibuprofen Given IVF hydration Repeat bmet in AM  Dm2 STOP Metformin due to ARF, and lactic acidosis Holding Glipizide while in hospital fsbs q4h, ISS  Hypertension Hold Losartan Cont Bisoprolol-hydrochlorothiazide Cont Amlodipine $RemoveBeforeD'5mg'BHapxEqihIqDdc$  po qday as tolerated  Hyperlipidemia, h/o TIA Cont Lipitor $RemoveBefo'20mg'RNzhSOddUNc$  po qhs Cont Aspirin $RemoveBefo'81mg'VBcCnBPPfDX$  po qday as tolerated  ? Gerd/ eosphagitis on CT scan Start Pepcid $RemoveBefo'10mg'OXwXdfaySsW$  po bid   Cardiac murmer 2d echo performed and reviewed personally. Findings of mild thickening of the aortic valve and mild calcifications  DVT prophylaxis: lovenox subq Code Status: Full Family Communication: Pt in room, family not in room Disposition Plan: Uncertain at this time  Consultants:     Procedures:     Antimicrobials: Anti-infectives (From admission, onward)   Start     Dose/Rate Route Frequency Ordered Stop   12/07/18 1945  cefTRIAXone (ROCEPHIN) 2 g in sodium chloride 0.9 % 100 mL IVPB     2 g 200 mL/hr over 30 Minutes Intravenous  Once 12/07/18 1943 12/07/18 2037   12/07/18 1945  metroNIDAZOLE (FLAGYL) IVPB 500 mg     500 mg 100 mL/hr over 60 Minutes Intravenous  Once 12/07/18 1943 12/07/18 2202       Subjective: Denies acute abd discomfort  Objective: Vitals:   12/08/18 0546 12/08/18 1100 12/08/18 1355 12/08/18 1500  BP: (!) 150/61  (!) 147/66   Pulse: 81  76   Resp: 16  16   Temp: (!) 97.4 F (36.3 C)  98 F (36.7 C)   TempSrc: Oral  Oral   SpO2: 98%  99%   Weight:    69.8 kg  Height:  $Remove'5\' 4"'fWySujy$  (1.626 m)      Intake/Output Summary (Last 24 hours) at 12/08/2018 1845 Last data filed at 12/08/2018 1400 Gross per 24 hour  Intake 1291.94 ml  Output 0 ml  Net 1291.94 ml   Filed Weights   12/08/18 1500  Weight: 69.8 kg    Examination:  General exam: Appears calm and comfortable  Respiratory system: Clear to auscultation. Respiratory effort normal. Cardiovascular system: S1 & S2 heard, RRR.  Gastrointestinal system: Distended, decreased BS Central nervous system: Alert and oriented. No focal neurological deficits. Extremities: Symmetric 5 x 5 power. Skin: No rashes, lesions  Psychiatry: Judgement and insight appear normal. Mood & affect appropriate.   Data Reviewed: I have personally reviewed following labs and imaging studies  CBC: Recent Labs  Lab 12/07/18 1232 12/08/18 0346  WBC 13.7* 12.1*  NEUTROABS 11.9*  --   HGB 11.8* 10.2*  HCT  37.6 31.7*  MCV 90.2 89.5  PLT 407* 322   Basic Metabolic Panel: Recent Labs  Lab 12/07/18 1232 12/08/18 0346  NA 139 139  K 4.0 3.0*  CL 97* 103  CO2 21* 23  GLUCOSE 102* 75  BUN 44* 32*  CREATININE 1.54* 0.92  CALCIUM 9.3 7.9*   GFR: Estimated Creatinine Clearance: 40.5 mL/min (by C-G formula based on SCr of 0.92 mg/dL). Liver Function Tests: Recent Labs  Lab 12/07/18 1232 12/08/18 0346  AST 26 20  ALT 18 16  ALKPHOS 60 42  BILITOT 0.9 0.7  PROT 6.7 5.3*  ALBUMIN 3.5 2.7*   Recent Labs  Lab 12/07/18 1232  LIPASE 19   No results for input(s): AMMONIA in the last 168 hours. Coagulation Profile: No results for input(s): INR, PROTIME in the last 168 hours. Cardiac Enzymes: No results for input(s): CKTOTAL, CKMB, CKMBINDEX, TROPONINI in the last 168 hours. BNP (last 3 results) No results for input(s): PROBNP in the last 8760 hours. HbA1C: No results for input(s): HGBA1C in the last 72 hours. CBG: Recent Labs  Lab 12/08/18 0544 12/08/18 0650 12/08/18 0726 12/08/18 1132 12/08/18 1632  GLUCAP 67* 104* 116* 100* 152*   Lipid Profile: No results for input(s): CHOL, HDL, LDLCALC, TRIG, CHOLHDL, LDLDIRECT in the last 72 hours. Thyroid Function Tests: No results for input(s): TSH, T4TOTAL, FREET4, T3FREE, THYROIDAB in the last 72 hours. Anemia Panel: No results for input(s): VITAMINB12, FOLATE, FERRITIN, TIBC, IRON, RETICCTPCT in the last 72 hours. Sepsis Labs: Recent Labs  Lab 12/07/18 1232 12/07/18 1455 12/08/18 0346  LATICACIDVEN 2.7* 1.4 0.7    Recent Results (from the past 240 hour(s))  SARS Coronavirus 2 (CEPHEID - Performed in [facility] hospital lab), Hosp Order     Status: None   Collection Time: 12/07/18  1:11 PM   Specimen: Nasopharyngeal Swab  Result Value Ref Range Status   SARS Coronavirus 2 NEGATIVE NEGATIVE Final    Comment: (NOTE) If result is NEGATIVE SARS-CoV-2 target nucleic acids are NOT DETECTED. The SARS-CoV-2 RNA is  generally detectable in upper and lower  respiratory specimens during the acute phase of infection. The lowest  concentration of SARS-CoV-2 viral copies this assay can detect is 250  copies / mL. A negative result does not preclude SARS-CoV-2 infection  and should not be used as the sole basis for treatment or other  patient management decisions.  A negative result may occur with  improper specimen collection / handling, submission of specimen other  than nasopharyngeal swab, presence of viral mutation(s) within the  areas targeted by this assay, and inadequate number of viral copies  (<250 copies / mL). A negative result must be combined with clinical  observations, patient history, and epidemiological information. If result is POSITIVE SARS-CoV-2 target nucleic acids are DETECTED. The SARS-CoV-2 RNA is generally detectable in upper and lower  respiratory specimens dur ing the acute phase of infection.  Positive  results are indicative of active infection with SARS-CoV-2.  Clinical  correlation with patient history and other diagnostic information is  necessary to determine patient infection status.  Positive results do  not rule out bacterial infection or co-infection with other viruses. If result is PRESUMPTIVE POSTIVE SARS-CoV-2 nucleic acids MAY BE PRESENT.   A presumptive positive result was obtained on the submitted specimen  and confirmed on repeat testing.  While 2019 novel coronavirus  (SARS-CoV-2) nucleic acids may be present in the submitted sample  additional confirmatory testing may be necessary for epidemiological  and / or clinical management purposes  to differentiate between  SARS-CoV-2 and other Sarbecovirus currently known to infect humans.  If clinically indicated additional testing with an alternate test  methodology [phone]) is advised. The SARS-CoV-2 RNA is generally  detectable in upper and lower respiratory sp ecimens during the acute  phase of infection.  The expected result is Negative. Fact Sheet for Patients:  StrictlyIdeas.no Fact Sheet for Healthcare Providers: BankingDealers.co.za This test is not yet approved or cleared by the [place] FDA and has been authorized for detection and/or diagnosis of SARS-CoV-2 by FDA under an Emergency Use Authorization (EUA).  This EUA will remain in effect (meaning this test can be used) for the duration of the COVID-19 declaration under Section 564(b)(1) of the Act, 21 U.S.C. section 360bbb-3(b)(1), unless the authorization is terminated or revoked sooner. Performed at [facility], [place] [address] [place], [place] [address]      Radiology Studies: Ct Abdomen Pelvis Wo Contrast  Result Date: 12/07/2018 CLINICAL DATA:  Nausea, vomiting, diarrhea for 4 days. History Clostridium difficile, prior intestinal blockage. EXAM: CT ABDOMEN AND PELVIS WITHOUT CONTRAST TECHNIQUE: Multidetector CT imaging of the abdomen and pelvis was performed following the standard protocol without IV contrast. COMPARISON:  None. FINDINGS: Lower chest: Bandlike areas of basilar atelectasis and/or scarring. Cardiac size is enlarged. Extensive coronary calcifications are present. Mitral annular calcifications are present as well. Hepatobiliary: No concerning liver lesions. Post cholecystectomy. Prominence of biliary tree, likely related to senescent change and post cholecystectomy reservoir effect. Pancreas: Diffuse fatty replacement of the pancreas. No pancreatic ductal dilatation. No concerning pancreatic lesions. Spleen: Normal in size without focal abnormality. Adrenals/Urinary Tract: Bilateral cortical thinning, incompletely assessed on enhanced CT. Coarse 9 mm calcification in the upper pole right kidney. No obstructive urolithiasis. No hydronephrosis. Mild symmetric bilateral perinephric stranding is nonspecific and may be age related. Stomach/Bowel: Mild  distal esophageal thickening, nonspecific. Moderate hiatal hernia. Small bowel is unremarkable. Enteric contrast passes to the level of the proximal ileum. Pancolonic diverticulosis. Marked distension of the distal sigmoid and rectal vault with inspissated fecal material, this tool ball measures up to 15 cm in maximum transaxial dimension. Associated mural thickening of the distal sigmoid and rectum with faint adjacent fat stranding. Vascular/Lymphatic: Aortic atherosclerosis. No enlarged abdominal or pelvic lymph nodes. Reproductive: Uterus appears surgically absent. No concerning adnexal lesions. Other: No free fluid or free gas. No abdominal wall hernia. Minimal posterior body wall edema. Musculoskeletal: Multilevel discogenic and facet degenerative changes of the imaged thoracolumbar spine. No suspicious osseous lesions. IMPRESSION: Marked distension of the distal sigmoid and rectal vault with inspissated fecal material, consistent with fecal impaction. Associated mural thickening of the distal sigmoid and rectum with faint adjacent fat stranding, suggestive of stercoral colitis. Colonic diverticulosis without evidence of active diverticulitis. Bilateral cortical thinning of the kidneys. Nonobstructing right nephrolithiasis. Moderate hiatal hernia. Mild nonspecific thickening of the distal esophagus, correlate with  features of esophagitis or sequela from recent emesis. These results were called by telephone at the time of interpretation on 12/07/2018 at 7:08 pm to Dr. Marda Stalker , who verbally acknowledged these results. Electronically Signed   By: Lovena Le M.D.   On: 12/07/2018 19:21    Scheduled Meds: . enoxaparin (LOVENOX) injection  40 mg Subcutaneous QHS  . famotidine  10 mg Oral BID  . insulin aspart  0-9 Units Subcutaneous Q4H   Continuous Infusions: . sodium chloride 75 mL/hr at 12/08/18 1400     LOS: 0 days   Marylu Lund, MD [facility] Pager On Amion  If 7PM-7AM,  please contact night-coverage 12/08/2018, 6:45 PM

## 2018-12-09 LAB — GLUCOSE, CAPILLARY
Glucose-Capillary: 150 mg/dL — ABNORMAL HIGH (ref 70–99)
Glucose-Capillary: 116 mg/dL — ABNORMAL HIGH (ref 70–99)
Glucose-Capillary: 127 mg/dL — ABNORMAL HIGH (ref 70–99)
Glucose-Capillary: 171 mg/dL — ABNORMAL HIGH (ref 70–99)
Glucose-Capillary: 114 mg/dL — ABNORMAL HIGH (ref 70–99)
Glucose-Capillary: 130 mg/dL — ABNORMAL HIGH (ref 70–99)

## 2018-12-09 MED ORDER — POTASSIUM CHLORIDE CRYS ER 20 MEQ PO TBCR
40.0000 meq | EXTENDED_RELEASE_TABLET | Freq: Two times a day (BID) | ORAL | Status: AC
  Administered 2018-12-09 (×2): 40 meq via ORAL
  Filled 2018-12-09: qty 2

## 2018-12-09 NOTE — Progress Notes (Signed)
PROGRESS NOTE    Stacey Armstrong  PPI:[id number] DOB: 04-28-1928 DOA: 12/07/2018 PCP: Patient, No Pcp Per    Brief Narrative:  85 y.o. female, w hypertension, hyperlipidemia, dm2, h/o TIA?,  C. Diff circa 2018/2019, apparently presents with diarrhea, loose stool since Saturday.  Pt having multiple episodes 3-4 per day.  Pt noted n/v, for the past 1-2 days.  No hematemesis. Slight lower abdominal cramping per pt.    Pt denies fever, chills, cough, cp, palp, sob, brbpr, dysuria, hematuria.  Pt states had colonoscopy in the remote past and was negative.    Pt presented due to diarrhea.   Assessment & Plan:   Active Problems:   Fecal impaction (HCC)   Type 2 diabetes mellitus with hyperlipidemia (HCC)   Essential hypertension   Diarrhea   Nausea and vomiting   ARF (acute renal failure) (HCC)  Diarrhea,  Likely secondary to fecal impaction.   ddx C. Diff (pt has history of C. Diff) Cfiff was ordered at time of presenation given prior hx of Cdiff Metformin on hold per below CT abd personally reviewed. Marked amount of stool distally filling the majority of the pelvic region Given trial of manual disimpaction, SMOG enema, and golytely, ultimately with over 14 bowel movements reported overnight   Fecal impaction w stercoral colitis Multiple BM noted overnight Recommend scheduled cathartics and/or Linzess as outpatient  N/v Likely secondary to marked constipation Improved. Advanced to carb mod diet  ARF Hold Losartan STOPPED Ibuprofen Given IVF hydration Recheck bmet in AM  Dm2 STOP Metformin due to ARF, and lactic acidosis Holding Glipizide while in hospital Cont SSI coverage  Hypertension Hold Losartan Cont Bisoprolol-hydrochlorothiazide Cont Amlodipine $RemoveBeforeD'5mg'xtachiwTCmBatw$  po qday as tolerated  Hyperlipidemia, h/o TIA Cont Lipitor $RemoveBefo'20mg'CtVibNdKHup$  po qhs Cont Aspirin $RemoveBefo'81mg'mOTtOITkWJe$  po qday as tolerated Stable at present  ? Gerd/ eosphagitis on CT scan Continued on Pepcid $RemoveB'10mg'ndoqEDmY$  po bid  Cardiac  murmer 2d echo performed and reviewed personally. Findings of mild thickening of the aortic valve and mild calcifications  DVT prophylaxis: lovenox subq Code Status: Full Family Communication: Pt in room, family not in room Disposition Plan: Uncertain at this time  Consultants:     Procedures:     Antimicrobials: Anti-infectives (From admission, onward)   Start     Dose/Rate Route Frequency Ordered Stop   12/07/18 1945  cefTRIAXone (ROCEPHIN) 2 g in sodium chloride 0.9 % 100 mL IVPB     2 g 200 mL/hr over 30 Minutes Intravenous  Once 12/07/18 1943 12/07/18 2037   12/07/18 1945  metroNIDAZOLE (FLAGYL) IVPB 500 mg     500 mg 100 mL/hr over 60 Minutes Intravenous  Once 12/07/18 1943 12/07/18 2202      Subjective: Reports feeling better after multiple bowel movements overnight  Objective: Vitals:   12/08/18 2115 12/09/18 0501 12/09/18 0652 12/09/18 1448  BP: (!) 173/71 (!) 130/43 (!) 148/55 (!) 144/67  Pulse: 97 89 77 74  Resp: (!) $RemoveB'24 20  18  'hIComsOT$ Temp: 97.8 F (36.6 C) 98.2 F (36.8 C)  98 F (36.7 C)  TempSrc: Oral Oral  Oral  SpO2: 100% 95%  98%  Weight:      Height:        Intake/Output Summary (Last 24 hours) at 12/09/2018 1823 Last data filed at 12/09/2018 1449 Gross per 24 hour  Intake 360 ml  Output 0 ml  Net 360 ml   Filed Weights   12/08/18 1500  Weight: 69.8 kg    Examination: General exam: Awake,  laying in bed, in nad Respiratory system: Normal respiratory effort, no wheezing Cardiovascular system: regular rate, s1, s2 Gastrointestinal system: soft, pos BS Central nervous system: CN2-12 grossly intact, strength intact Extremities: Perfused, no clubbing Skin: Normal skin turgor, no notable skin lesions seen Psychiatry: Mood normal // no visual hallucinations   Data Reviewed: I have personally reviewed following labs and imaging studies  CBC: Recent Labs  Lab 12/07/18 1232 12/08/18 0346  WBC 13.7* 12.1*  NEUTROABS 11.9*  --   HGB 11.8*  10.2*  HCT 37.6 31.7*  MCV 90.2 89.5  PLT 407* 767   Basic Metabolic Panel: Recent Labs  Lab 12/07/18 1232 12/08/18 0346  NA 139 139  K 4.0 3.0*  CL 97* 103  CO2 21* 23  GLUCOSE 102* 75  BUN 44* 32*  CREATININE 1.54* 0.92  CALCIUM 9.3 7.9*   GFR: Estimated Creatinine Clearance: 40.5 mL/min (by C-G formula based on SCr of 0.92 mg/dL). Liver Function Tests: Recent Labs  Lab 12/07/18 1232 12/08/18 0346  AST 26 20  ALT 18 16  ALKPHOS 60 42  BILITOT 0.9 0.7  PROT 6.7 5.3*  ALBUMIN 3.5 2.7*   Recent Labs  Lab 12/07/18 1232  LIPASE 19   No results for input(s): AMMONIA in the last 168 hours. Coagulation Profile: No results for input(s): INR, PROTIME in the last 168 hours. Cardiac Enzymes: No results for input(s): CKTOTAL, CKMB, CKMBINDEX, TROPONINI in the last 168 hours. BNP (last 3 results) No results for input(s): PROBNP in the last 8760 hours. HbA1C: No results for input(s): HGBA1C in the last 72 hours. CBG: Recent Labs  Lab 12/09/18 0013 12/09/18 0456 12/09/18 0731 12/09/18 1201 12/09/18 1615  GLUCAP 150* 171* 116* 127* 114*   Lipid Profile: No results for input(s): CHOL, HDL, LDLCALC, TRIG, CHOLHDL, LDLDIRECT in the last 72 hours. Thyroid Function Tests: No results for input(s): TSH, T4TOTAL, FREET4, T3FREE, THYROIDAB in the last 72 hours. Anemia Panel: No results for input(s): VITAMINB12, FOLATE, FERRITIN, TIBC, IRON, RETICCTPCT in the last 72 hours. Sepsis Labs: Recent Labs  Lab 12/07/18 1232 12/07/18 1455 12/08/18 0346  LATICACIDVEN 2.7* 1.4 0.7    Recent Results (from the past 240 hour(s))  SARS Coronavirus 2 (CEPHEID - Performed in [facility] hospital lab), Hosp Order     Status: None   Collection Time: 12/07/18  1:11 PM   Specimen: Nasopharyngeal Swab  Result Value Ref Range Status   SARS Coronavirus 2 NEGATIVE NEGATIVE Final    Comment: (NOTE) If result is NEGATIVE SARS-CoV-2 target nucleic acids are NOT DETECTED. The SARS-CoV-2  RNA is generally detectable in upper and lower  respiratory specimens during the acute phase of infection. The lowest  concentration of SARS-CoV-2 viral copies this assay can detect is 250  copies / mL. A negative result does not preclude SARS-CoV-2 infection  and should not be used as the sole basis for treatment or other  patient management decisions.  A negative result may occur with  improper specimen collection / handling, submission of specimen other  than nasopharyngeal swab, presence of viral mutation(s) within the  areas targeted by this assay, and inadequate number of viral copies  (<250 copies / mL). A negative result must be combined with clinical  observations, patient history, and epidemiological information. If result is POSITIVE SARS-CoV-2 target nucleic acids are DETECTED. The SARS-CoV-2 RNA is generally detectable in upper and lower  respiratory specimens dur ing the acute phase of infection.  Positive  results are indicative of active  infection with SARS-CoV-2.  Clinical  correlation with patient history and other diagnostic information is  necessary to determine patient infection status.  Positive results do  not rule out bacterial infection or co-infection with other viruses. If result is PRESUMPTIVE POSTIVE SARS-CoV-2 nucleic acids MAY BE PRESENT.   A presumptive positive result was obtained on the submitted specimen  and confirmed on repeat testing.  While 2019 novel coronavirus  (SARS-CoV-2) nucleic acids may be present in the submitted sample  additional confirmatory testing may be necessary for epidemiological  and / or clinical management purposes  to differentiate between  SARS-CoV-2 and other Sarbecovirus currently known to infect humans.  If clinically indicated additional testing with an alternate test  methodology [phone]) is advised. The SARS-CoV-2 RNA is generally  detectable in upper and lower respiratory sp ecimens during the acute  phase of  infection. The expected result is Negative. Fact Sheet for Patients:  StrictlyIdeas.no Fact Sheet for Healthcare Providers: BankingDealers.co.za This test is not yet approved or cleared by the [place] FDA and has been authorized for detection and/or diagnosis of SARS-CoV-2 by FDA under an Emergency Use Authorization (EUA).  This EUA will remain in effect (meaning this test can be used) for the duration of the COVID-19 declaration under Section 564(b)(1) of the Act, 21 U.S.C. section 360bbb-3(b)(1), unless the authorization is terminated or revoked sooner. Performed at [facility], [place] [address] [place], [place] [address]   Urine culture     Status: None   Collection Time: 12/07/18  5:54 PM   Specimen: Urine, Random  Result Value Ref Range Status   Specimen Description   Final    URINE, RANDOM Performed at [place] [address], [place], [place] [address]    Special Requests   Final    NONE Performed at [facility], [place] [address] [place], [place] [address]    Culture   Final    NO GROWTH Performed at [facility], [place] [address], [place], [place] [address]    Report Status 12/08/2018 FINAL  Final     Radiology Studies: Ct Abdomen Pelvis Wo Contrast  Result Date: 12/07/2018 CLINICAL DATA:  Nausea, vomiting, diarrhea for 4 days. History Clostridium difficile, prior intestinal blockage. EXAM: CT ABDOMEN AND PELVIS WITHOUT CONTRAST TECHNIQUE: Multidetector CT imaging of the abdomen and pelvis was performed following the standard protocol without IV contrast. COMPARISON:  None. FINDINGS: Lower chest: Bandlike areas of basilar atelectasis and/or scarring. Cardiac size is enlarged. Extensive coronary calcifications are present. Mitral annular calcifications are present as well. Hepatobiliary: No concerning liver lesions. Post cholecystectomy. Prominence of  biliary tree, likely related to senescent change and post cholecystectomy reservoir effect. Pancreas: Diffuse fatty replacement of the pancreas. No pancreatic ductal dilatation. No concerning pancreatic lesions. Spleen: Normal in size without focal abnormality. Adrenals/Urinary Tract: Bilateral cortical thinning, incompletely assessed on enhanced CT. Coarse 9 mm calcification in the upper pole right kidney. No obstructive urolithiasis. No hydronephrosis. Mild symmetric bilateral perinephric stranding is nonspecific and may be age related. Stomach/Bowel: Mild distal esophageal thickening, nonspecific. Moderate hiatal hernia. Small bowel is unremarkable. Enteric contrast passes to the level of the proximal ileum. Pancolonic diverticulosis. Marked distension of the distal sigmoid and rectal vault with inspissated fecal material, this tool ball measures up to 15 cm in maximum transaxial dimension. Associated mural thickening of the distal sigmoid and rectum with faint adjacent fat stranding. Vascular/Lymphatic: Aortic atherosclerosis. No enlarged abdominal or pelvic lymph nodes.  Reproductive: Uterus appears surgically absent. No concerning adnexal lesions. Other: No free fluid or free gas. No abdominal wall hernia. Minimal posterior body wall edema. Musculoskeletal: Multilevel discogenic and facet degenerative changes of the imaged thoracolumbar spine. No suspicious osseous lesions. IMPRESSION: Marked distension of the distal sigmoid and rectal vault with inspissated fecal material, consistent with fecal impaction. Associated mural thickening of the distal sigmoid and rectum with faint adjacent fat stranding, suggestive of stercoral colitis. Colonic diverticulosis without evidence of active diverticulitis. Bilateral cortical thinning of the kidneys. Nonobstructing right nephrolithiasis. Moderate hiatal hernia. Mild nonspecific thickening of the distal esophagus, correlate with features of esophagitis or sequela from  recent emesis. These results were called by telephone at the time of interpretation on 12/07/2018 at 7:08 pm to Dr. Marda Stalker , who verbally acknowledged these results. Electronically Signed   By: Lovena Le M.D.   On: 12/07/2018 19:21    Scheduled Meds: . enoxaparin (LOVENOX) injection  40 mg Subcutaneous QHS  . famotidine  10 mg Oral BID  . insulin aspart  0-9 Units Subcutaneous Q4H  . potassium chloride  40 mEq Oral BID   Continuous Infusions:    LOS: 1 day   Marylu Lund, MD [facility] Pager On Amion  If 7PM-7AM, please contact night-coverage 12/09/2018, 6:23 PM

## 2018-12-10 DIAGNOSIS — R1084 Generalized abdominal pain: Secondary | ICD-10-CM

## 2018-12-10 LAB — GLUCOSE, CAPILLARY
Glucose-Capillary: 95 mg/dL (ref 70–99)
Glucose-Capillary: 82 mg/dL (ref 70–99)
Glucose-Capillary: 98 mg/dL (ref 70–99)
Glucose-Capillary: 123 mg/dL — ABNORMAL HIGH (ref 70–99)

## 2018-12-10 LAB — BASIC METABOLIC PANEL
Anion gap: 5 (ref 5–15)
BUN: 13 mg/dL (ref 8–23)
CO2: 29 mmol/L (ref 22–32)
Calcium: 7.9 mg/dL — ABNORMAL LOW (ref 8.9–10.3)
Chloride: 103 mmol/L (ref 98–111)
Creatinine, Ser: 0.72 mg/dL (ref 0.44–1.00)
GFR calc Af Amer: >60 mL/min (ref >60)
GFR calc non Af Amer: >60 mL/min (ref >60)
Glucose, Bld: 102 mg/dL — ABNORMAL HIGH (ref 70–99)
Potassium: 2.7 mmol/L — CL (ref 3.5–5.1)
Sodium: 137 mmol/L (ref 135–145)

## 2018-12-10 LAB — MAGNESIUM: Magnesium: 1.9 mg/dL (ref 1.7–2.4)

## 2018-12-10 MED ORDER — POTASSIUM CHLORIDE CRYS ER 20 MEQ PO TBCR
40.0000 meq | EXTENDED_RELEASE_TABLET | Freq: Once | ORAL | Status: AC
  Administered 2018-12-10: 40 meq via ORAL
  Filled 2018-12-10: qty 2

## 2018-12-10 MED ORDER — GLUCERNA SHAKE PO LIQD
237.0000 mL | Freq: Two times a day (BID) | ORAL | Status: DC
  Filled 2018-12-10: qty 237

## 2018-12-10 MED ORDER — POTASSIUM CHLORIDE CRYS ER 20 MEQ PO TBCR
40.0000 meq | EXTENDED_RELEASE_TABLET | Freq: Two times a day (BID) | ORAL | Status: DC
  Administered 2018-12-10: 40 meq via ORAL
  Filled 2018-12-10: qty 2

## 2018-12-10 MED ORDER — POTASSIUM CHLORIDE 10 MEQ/100ML IV SOLN
10.0000 meq | INTRAVENOUS | Status: AC
  Administered 2018-12-10 (×2): 10 meq via INTRAVENOUS
  Filled 2018-12-10 (×2): qty 100

## 2018-12-10 NOTE — Progress Notes (Signed)
Initial Nutrition Assessment  INTERVENTION:   -Provide Glucerna Shake po BID, each supplement provides 220 kcal and 10 grams of protein -Provided "Carbohydrate Counting" handout from [employer] in discharge instructions.  NUTRITION DIAGNOSIS:   Inadequate oral intake related to diarrhea, constipation, nausea, vomiting as evidenced by per patient/family report.  GOAL:   Patient will meet greater than or equal to 90% of their needs  MONITOR:   PO intake, Supplement acceptance, Labs, Weight trends, I & O's  REASON FOR ASSESSMENT:   Consult Diet education  ASSESSMENT:   85 y.o. female with a past medical history significant for hypertension, diabetes who presents with nausea, vomiting, diarrhea, abdominal pain, and cough.  Patient reports that for the last several days she is been having symptoms.  She reports that 4 days of diarrhea. It is nonbloody.  She reports since 7/13 she did have vomiting has not kept any fluids down.Admitted for diarrhea and fecal impaction.  **RD working [employer] advanced to CHO modified diet now. Was on clears liquids initially since admission given fecal impaction. Pt has now had multiple BMs. No longer having nausea or vomiting like PTA.  Pt currently consuming 25% of meals at this time. Given poor PO will order Glucerna shakes. Attempted to reach patient by phone to discuss diabetes diet, no answer. Will place handout in discharge instructions for patient to review at home.   No weight records prior to admission available in chart.   Medications: K-DUR tablet BID Labs reviewed: CBGs: 82-95 Low K Mg WNL   NUTRITION - FOCUSED PHYSICAL EXAM:  Unable to perform -working remotely.  Diet Order:   Diet Order            Diet Carb Modified Fluid consistency: Thin; Room service appropriate? Yes  Diet effective now              EDUCATION NEEDS:   Education needs have been addressed  Skin:  Skin  Assessment: Reviewed RN Assessment  Last BM:  7/16  Height:   Ht Readings from Last 1 Encounters:  12/08/18 $RemoveB'5\' 4"'ueZfYqCi$  (1.626 m)    Weight:   Wt Readings from Last 1 Encounters:  12/10/18 68.8 kg    Ideal Body Weight:  54.5 kg  BMI:  Body mass index is 26.04 kg/m.  Estimated Nutritional Needs:   Kcal:  1600-1800  Protein:  70-80g  Fluid:  1.8L/day  Clayton Bibles, MS, RD, LDN [place] Dietitian Pager: [phone] After Hours Pager: [phone]

## 2018-12-10 NOTE — Care Management Important Message (Signed)
Important Message  Patient Details IM Letter given to given to Rhea Pink SW to present to the Patient Name: Stacey Armstrong MRN: [id number] Date of Birth: 03-10-1928   Medicare Important Message Given:  Yes     Kerin Salen 12/10/2018, 10:14 AM

## 2018-12-10 NOTE — Discharge Summary (Signed)
Physician Discharge Summary  Stacey Armstrong ONG:[id number] DOB: 1927/07/09 DOA: 12/07/2018  PCP: Patient, No Pcp Per  Admit date: 12/07/2018 Discharge date: 12/10/2018  Admitted From: Home Disposition:  Home  Recommendations for Outpatient Follow-up:  1. Follow up with PCP in 1-2 weeks 2. Please obtain BMP/CBC in one week 3. Please follow up on the following pending results:   Discharge Condition:Improved CODE STATUS:Full Diet recommendation: Diabetic   Brief/Interim Summary: 85 y.o.female,w hypertension, hyperlipidemia, dm2, h/o TIA?, C. Diff circa 2018/2019, apparently presents with diarrhea, loose stool since Saturday. Pt having multiple episodes 3-4 per day. Pt noted n/v, for the past 1-2 days. No hematemesis. Slight lower abdominal cramping per pt. Pt denies fever, chills, cough, cp, palp, sob, brbpr, dysuria, hematuria. Pt states had colonoscopy in the remote past and was negative. Pt presented due to diarrhea.   Discharge Diagnoses:  Active Problems:   Fecal impaction (HCC)   Type 2 diabetes mellitus with hyperlipidemia (HCC)   Essential hypertension   Diarrhea   Nausea and vomiting   ARF (acute renal failure) (HCC)  Diarrhea, Likely secondary to fecal impaction.  cdiff ruled out Metformin on hold initially CT abd personally reviewed. Marked amount of stool distally filling the majority of the pelvic region Given trial of manual disimpaction, SMOG enema, and golytely, ultimately with over 18 bowel movements reported overnight   Fecal impaction w stercoral colitis Multiple BM noted overnight Recommend scheduled cathartics as tolerated  N/v Likely secondary to marked constipation Improved. Advanced to carb mod diet  ARF Hold Losartan Held Ibuprofen Given IVF hydration Recheck bmet in AM  Dm2 Held Metformin due to ARF, and lactic acidosis Holding Glipizide while in hospital Resume metformin  Hypertension Hold Losartan Cont  Bisoprolol-hydrochlorothiazide Cont Amlodipine $RemoveBeforeD'5mg'PsdWeRYCptrPsz$  po qday as tolerated  Hyperlipidemia, h/o TIA Cont Lipitor $RemoveBefo'20mg'EdTniVZnTeR$  po qhs Cont Aspirin $RemoveBefo'81mg'UrisgODyTKm$  po qday as tolerated  ? Gerd/ eosphagitis on CT scan Continued on Pepcid $RemoveB'10mg'ecswgsrZ$  po bid  Cardiac murmer 2d echo performed and reviewed personally. Findings of mild thickening of the aortic valve and mild calcifications  Discharge Instructions   Allergies as of 12/10/2018      Reactions   Penicillins Other (See Comments)   Unknown to pt daughter       Medication List    STOP taking these medications   amLODipine 5 MG tablet Commonly known as: NORVASC   bisoprolol-hydrochlorothiazide 2.5-6.25 MG tablet Commonly known as: ZIAC   glipiZIDE 10 MG 24 hr tablet Commonly known as: GLUCOTROL XL   losartan 100 MG tablet Commonly known as: COZAAR   polyethylene glycol 17 g packet Commonly known as: MIRALAX / GLYCOLAX     TAKE these medications   amitriptyline 25 MG tablet Commonly known as: ELAVIL Take 25 mg by mouth at bedtime.   aspirin EC 81 MG tablet Take 81 mg by mouth daily.   atorvastatin 20 MG tablet Commonly known as: LIPITOR Take 20 mg by mouth daily.   Centrum Silver 50+Women Tabs Take 1 tablet by mouth daily.   Fish Oil 1200 MG Caps Take 1,000 mg by mouth 2 (two) times a day.   fluticasone 50 MCG/ACT nasal spray Commonly known as: FLONASE Place 1 spray into both nostrils 2 (two) times a day.   ibuprofen 200 MG tablet Commonly known as: ADVIL Take 200 mg by mouth every 6 (six) hours as needed for moderate pain.   metFORMIN 1000 MG tablet Commonly known as: GLUCOPHAGE Take 1,000 mg by mouth 2 (two) times daily with a  meal.   OVER THE COUNTER MEDICATION Take 1 tablet by mouth daily. **Probiotic**   SYSTANE ULTRA OP Place 1 drop into both eyes as needed (dry eyes).   VITAMIN D (CHOLECALCIFEROL) PO Take 2,000 Units by mouth daily.       Allergies  Allergen Reactions  . Penicillins Other (See  Comments)    Unknown to pt daughter    Procedures/Studies: Ct Abdomen Pelvis Wo Contrast  Result Date: 12/07/2018 CLINICAL DATA:  Nausea, vomiting, diarrhea for 4 days. History Clostridium difficile, prior intestinal blockage. EXAM: CT ABDOMEN AND PELVIS WITHOUT CONTRAST TECHNIQUE: Multidetector CT imaging of the abdomen and pelvis was performed following the standard protocol without IV contrast. COMPARISON:  None. FINDINGS: Lower chest: Bandlike areas of basilar atelectasis and/or scarring. Cardiac size is enlarged. Extensive coronary calcifications are present. Mitral annular calcifications are present as well. Hepatobiliary: No concerning liver lesions. Post cholecystectomy. Prominence of biliary tree, likely related to senescent change and post cholecystectomy reservoir effect. Pancreas: Diffuse fatty replacement of the pancreas. No pancreatic ductal dilatation. No concerning pancreatic lesions. Spleen: Normal in size without focal abnormality. Adrenals/Urinary Tract: Bilateral cortical thinning, incompletely assessed on enhanced CT. Coarse 9 mm calcification in the upper pole right kidney. No obstructive urolithiasis. No hydronephrosis. Mild symmetric bilateral perinephric stranding is nonspecific and may be age related. Stomach/Bowel: Mild distal esophageal thickening, nonspecific. Moderate hiatal hernia. Small bowel is unremarkable. Enteric contrast passes to the level of the proximal ileum. Pancolonic diverticulosis. Marked distension of the distal sigmoid and rectal vault with inspissated fecal material, this tool ball measures up to 15 cm in maximum transaxial dimension. Associated mural thickening of the distal sigmoid and rectum with faint adjacent fat stranding. Vascular/Lymphatic: Aortic atherosclerosis. No enlarged abdominal or pelvic lymph nodes. Reproductive: Uterus appears surgically absent. No concerning adnexal lesions. Other: No free fluid or free gas. No abdominal wall hernia. Minimal  posterior body wall edema. Musculoskeletal: Multilevel discogenic and facet degenerative changes of the imaged thoracolumbar spine. No suspicious osseous lesions. IMPRESSION: Marked distension of the distal sigmoid and rectal vault with inspissated fecal material, consistent with fecal impaction. Associated mural thickening of the distal sigmoid and rectum with faint adjacent fat stranding, suggestive of stercoral colitis. Colonic diverticulosis without evidence of active diverticulitis. Bilateral cortical thinning of the kidneys. Nonobstructing right nephrolithiasis. Moderate hiatal hernia. Mild nonspecific thickening of the distal esophagus, correlate with features of esophagitis or sequela from recent emesis. These results were called by telephone at the time of interpretation on 12/07/2018 at 7:08 pm to Dr. Marda Stalker , who verbally acknowledged these results. Electronically Signed   By: Lovena Le M.D.   On: 12/07/2018 19:21     Subjective: Eager to go home  Discharge Exam: Vitals:   12/10/18 0655 12/10/18 1418  BP: 116/64 120/68  Pulse: 77 74  Resp:  18  Temp:  98.3 F (36.8 C)  SpO2: 96% 99%   Vitals:   12/09/18 2045 12/10/18 0629 12/10/18 0655 12/10/18 1418  BP: (!) 157/58  116/64 120/68  Pulse: 71 77 77 74  Resp: $Remo'18 18  18  'lsHro$ Temp: 98.5 F (36.9 C) 97.7 F (36.5 C)  98.3 F (36.8 C)  TempSrc: Oral Oral  Oral  SpO2: 98% 97% 96% 99%  Weight:  68.8 kg    Height:        General: Pt is alert, awake, not in acute distress Cardiovascular: RRR, S1/S2 +, no rubs, no gallops Respiratory: CTA bilaterally, no wheezing, no rhonchi Abdominal: Soft, NT, ND,  bowel sounds + Extremities: no edema, no cyanosis   The results of significant diagnostics from this hospitalization (including imaging, microbiology, ancillary and laboratory) are listed below for reference.     Microbiology: Recent Results (from the past 240 hour(s))  SARS Coronavirus 2 (CEPHEID - Performed in [facility] hospital lab), Hosp Order     Status: None   Collection Time: 12/07/18  1:11 PM   Specimen: Nasopharyngeal Swab  Result Value Ref Range Status   SARS Coronavirus 2 NEGATIVE NEGATIVE Final    Comment: (NOTE) If result is NEGATIVE SARS-CoV-2 target nucleic acids are NOT DETECTED. The SARS-CoV-2 RNA is generally detectable in upper and lower  respiratory specimens during the acute phase of infection. The lowest  concentration of SARS-CoV-2 viral copies this assay can detect is 250  copies / mL. A negative result does not preclude SARS-CoV-2 infection  and should not be used as the sole basis for treatment or other  patient management decisions.  A negative result may occur with  improper specimen collection / handling, submission of specimen other  than nasopharyngeal swab, presence of viral mutation(s) within the  areas targeted by this assay, and inadequate number of viral copies  (<250 copies / mL). A negative result must be combined with clinical  observations, patient history, and epidemiological information. If result is POSITIVE SARS-CoV-2 target nucleic acids are DETECTED. The SARS-CoV-2 RNA is generally detectable in upper and lower  respiratory specimens dur ing the acute phase of infection.  Positive  results are indicative of active infection with SARS-CoV-2.  Clinical  correlation with patient history and other diagnostic information is  necessary to determine patient infection status.  Positive results do  not rule out bacterial infection or co-infection with other viruses. If result is PRESUMPTIVE POSTIVE SARS-CoV-2 nucleic acids MAY BE PRESENT.   A presumptive positive result was obtained on the submitted specimen  and confirmed on repeat testing.  While 2019 novel coronavirus  (SARS-CoV-2) nucleic acids may be present in the submitted sample  additional confirmatory testing may be necessary for epidemiological  and / or clinical management purposes  to  differentiate between  SARS-CoV-2 and other Sarbecovirus currently known to infect humans.  If clinically indicated additional testing with an alternate test  methodology [phone]) is advised. The SARS-CoV-2 RNA is generally  detectable in upper and lower respiratory sp ecimens during the acute  phase of infection. The expected result is Negative. Fact Sheet for Patients:  StrictlyIdeas.no Fact Sheet for Healthcare Providers: BankingDealers.co.za This test is not yet approved or cleared by the [place] FDA and has been authorized for detection and/or diagnosis of SARS-CoV-2 by FDA under an Emergency Use Authorization (EUA).  This EUA will remain in effect (meaning this test can be used) for the duration of the COVID-19 declaration under Section 564(b)(1) of the Act, 21 U.S.C. section 360bbb-3(b)(1), unless the authorization is terminated or revoked sooner. Performed at [facility], [place] [address] [place], [place] [address]   Urine culture     Status: None   Collection Time: 12/07/18  5:54 PM   Specimen: Urine, Random  Result Value Ref Range Status   Specimen Description   Final    URINE, RANDOM Performed at [facility] [address] [place], [place] [address]    Special Requests   Final    NONE Performed at [facility], [place] [address], [place], [place] [address]    Culture   Final  NO GROWTH Performed at [facility], [address] [place], [place] [address]    Report Status 12/08/2018 FINAL  Final     Labs: BNP (last 3 results) No results for input(s): BNP in the last 8760 hours. Basic Metabolic Panel: Recent Labs  Lab 12/07/18 1232 12/08/18 0346 12/10/18 0319  NA 139 139 137  K 4.0 3.0* 2.7*  CL 97* 103 103  CO2 21* 23 29  GLUCOSE 102* 75 102*  BUN 44* 32* 13  CREATININE 1.54* 0.92 0.72  CALCIUM 9.3 7.9* 7.9*  MG  --   --  1.9    Liver Function Tests: Recent Labs  Lab 12/07/18 1232 12/08/18 0346  AST 26 20  ALT 18 16  ALKPHOS 60 42  BILITOT 0.9 0.7  PROT 6.7 5.3*  ALBUMIN 3.5 2.7*   Recent Labs  Lab 12/07/18 1232  LIPASE 19   No results for input(s): AMMONIA in the last 168 hours. CBC: Recent Labs  Lab 12/07/18 1232 12/08/18 0346  WBC 13.7* 12.1*  NEUTROABS 11.9*  --   HGB 11.8* 10.2*  HCT 37.6 31.7*  MCV 90.2 89.5  PLT 407* 314   Cardiac Enzymes: No results for input(s): CKTOTAL, CKMB, CKMBINDEX, TROPONINI in the last 168 hours. BNP: Invalid input(s): POCBNP CBG: Recent Labs  Lab 12/09/18 2039 12/09/18 2349 12/10/18 0303 12/10/18 0728 12/10/18 1121  GLUCAP 130* 98 95 82 123*   D-Dimer No results for input(s): DDIMER in the last 72 hours. Hgb A1c No results for input(s): HGBA1C in the last 72 hours. Lipid Profile No results for input(s): CHOL, HDL, LDLCALC, TRIG, CHOLHDL, LDLDIRECT in the last 72 hours. Thyroid function studies No results for input(s): TSH, T4TOTAL, T3FREE, THYROIDAB in the last 72 hours.  Invalid input(s): FREET3 Anemia work up No results for input(s): VITAMINB12, FOLATE, FERRITIN, TIBC, IRON, RETICCTPCT in the last 72 hours. Urinalysis    Component Value Date/Time   COLORURINE YELLOW 12/07/2018 1754   APPEARANCEUR CLEAR 12/07/2018 1754   LABSPEC 1.016 12/07/2018 1754   PHURINE 5.0 12/07/2018 1754   GLUCOSEU NEGATIVE 12/07/2018 1754   HGBUR NEGATIVE 12/07/2018 1754   BILIRUBINUR NEGATIVE 12/07/2018 1754   KETONESUR 20 (A) 12/07/2018 1754   PROTEINUR NEGATIVE 12/07/2018 1754   NITRITE NEGATIVE 12/07/2018 1754   LEUKOCYTESUR NEGATIVE 12/07/2018 1754   Sepsis Labs Invalid input(s): PROCALCITONIN,  WBC,  LACTICIDVEN Microbiology Recent Results (from the past 240 hour(s))  SARS Coronavirus 2 (CEPHEID - Performed in [facility] hospital lab), Hosp Order     Status: None   Collection Time: 12/07/18  1:11 PM   Specimen: Nasopharyngeal Swab  Result  Value Ref Range Status   SARS Coronavirus 2 NEGATIVE NEGATIVE Final    Comment: (NOTE) If result is NEGATIVE SARS-CoV-2 target nucleic acids are NOT DETECTED. The SARS-CoV-2 RNA is generally detectable in upper and lower  respiratory specimens during the acute phase of infection. The lowest  concentration of SARS-CoV-2 viral copies this assay can detect is 250  copies / mL. A negative result does not preclude SARS-CoV-2 infection  and should not be used as the sole basis for treatment or other  patient management decisions.  A negative result may occur with  improper specimen collection / handling, submission of specimen other  than nasopharyngeal swab, presence of viral mutation(s) within the  areas targeted by this assay, and inadequate number of viral copies  (<250 copies / mL). A negative result must be combined with clinical  observations, patient history,  and epidemiological information. If result is POSITIVE SARS-CoV-2 target nucleic acids are DETECTED. The SARS-CoV-2 RNA is generally detectable in upper and lower  respiratory specimens dur ing the acute phase of infection.  Positive  results are indicative of active infection with SARS-CoV-2.  Clinical  correlation with patient history and other diagnostic information is  necessary to determine patient infection status.  Positive results do  not rule out bacterial infection or co-infection with other viruses. If result is PRESUMPTIVE POSTIVE SARS-CoV-2 nucleic acids MAY BE PRESENT.   A presumptive positive result was obtained on the submitted specimen  and confirmed on repeat testing.  While 2019 novel coronavirus  (SARS-CoV-2) nucleic acids may be present in the submitted sample  additional confirmatory testing may be necessary for epidemiological  and / or clinical management purposes  to differentiate between  SARS-CoV-2 and other Sarbecovirus currently known to infect humans.  If clinically indicated additional testing  with an alternate test  methodology [phone]) is advised. The SARS-CoV-2 RNA is generally  detectable in upper and lower respiratory sp ecimens during the acute  phase of infection. The expected result is Negative. Fact Sheet for Patients:  StrictlyIdeas.no Fact Sheet for Healthcare Providers: BankingDealers.co.za This test is not yet approved or cleared by the [place] FDA and has been authorized for detection and/or diagnosis of SARS-CoV-2 by FDA under an Emergency Use Authorization (EUA).  This EUA will remain in effect (meaning this test can be used) for the duration of the COVID-19 declaration under Section 564(b)(1) of the Act, 21 U.S.C. section 360bbb-3(b)(1), unless the authorization is terminated or revoked sooner. Performed at [facility], [place] [address] [place], [place] [address]   Urine culture     Status: None   Collection Time: 12/07/18  5:54 PM   Specimen: Urine, Random  Result Value Ref Range Status   Specimen Description   Final    URINE, RANDOM Performed at [facility] [address] [place], [place] [address]    Special Requests   Final    NONE Performed at [facility], [place] [address], [place], [place] [address]    Culture   Final    NO GROWTH Performed at [facility], [place] [address], [place], [place] [address]    Report Status 12/08/2018 FINAL  Final   Time spent: 70min  SIGNED:   Marylu Lund, MD  [facility] 12/10/2018, 2:57 PM  If 7PM-7AM, please contact night-coverage

## 2018-12-10 NOTE — Discharge Instructions (Signed)
Carbohydrate Counting for People with Diabetes Why Is Carbohydrate Counting Important? Counting carbohydrate servings may help you control your blood glucose level so that you feel better. The balance between the carbohydrates you eat and insulin determines what your blood glucose level will be after eating. Carbohydrate counting can also help you plan your meals.  Which Foods Have Carbohydrates? Foods with carbohydrates include: Breads, crackers, and cereals Pasta, rice, and grains Starchy vegetables, such as potatoes, corn, and peas Beans and legumes Milk, soy milk, and yogurt Fruits and fruit juices Sweets, such as cakes, cookies, ice cream, jam, and jelly  Carbohydrate Servings In diabetes meal planning, 1 serving of a food with carbohydrate has about 15 grams of carbohydrate: Check serving sizes with measuring cups and spoons or a food scale. Read the Nutrition Facts on food labels to find out how many grams of carbohydrate are in foods you eat. The food lists in this handout show portions that have about 15 grams of carbohydrate.  Meal Planning Tips An Eating Plan tells you how many carbohydrate servings to eat at your meals and snacks. For many adults, eating 3 to 5 servings of carbohydrate foods at each meal and 1 or 2 carbohydrate servings for each snack works well. In a healthy daily Eating Plan, most carbohydrates come from: At least 6 servings of fruits and nonstarchy vegetables At least 6 servings of grains, beans, and starchy vegetables, with at least 3 servings from whole grains At least 2 servings of milk or milk products Check your blood glucose level regularly. It can tell you if you need to adjust when you eat carbohydrates. Eating foods that have fiber, such as whole grains, and having very few salty foods is good for your health. Eat 4 to 6 ounces of meat or other protein foods (such as soybean burgers) each day.  Choose low-fat sources of protein, such as lean  beef, lean pork, chicken, fish, low-fat cheese, or vegetarian foods such as soy. Eat some healthy fats, such as olive oil, canola oil, and nuts. Eat very little saturated fats. These unhealthy fats are found in butter, cream, and high-fat meats, such as bacon and sausage. Eat very little or no trans fats. These unhealthy fats are found in all foods that list partially hydrogenated oil as an Ingredient.  Label Reading Tips The Nutrition Facts panel on a label lists the grams of total carbohydrate in 1 standard serving.  The labels standard serving may be larger or smaller than 1 carbohydrate serving. To figure out how many carbohydrate servings are in the food: First, look at the labels standard serving size. Check the grams of total carbohydrate. This is the amount of carbohydrate in 1 standard serving. Divide the grams of total carbohydrate by 15. This number equals the number of carbohydrate servings in 1 standard serving.  Remember: 1 carbohydrate serving is 15 grams of carbohydrate. Note: You may ignore the grams of sugars on the Nutrition Facts panel because they are included in the grams of total carbohydrate.  Foods Recommended 1 serving = about 15 grams of carbohydrate  Starches 1 slice bread (1 ounce) 1 tortilla (6-inch size)  large bagel (1 ounce) 2 taco shells (5-inch size)  hamburger or hot dog bun ( ounce)  cup ready-to-eat unsweetened cereal  cup cooked cereal 1 cup broth-based soup 4 to 6 small crackers 1/3 cup pasta or rice (cooked)  cup beans, peas, corn, sweet potatoes, winter squash, or mashed or boiled potatoes (cooked)  large baked  potato (3 ounces)  ounce pretzels, potato chips, or tortilla chips 3 cups popcorn (popped)  Fruit 1 small fresh fruit ( to 1 cup)  cup canned or frozen fruit 2 tablespoons dried fruit (blueberries, cherries, cranberries, mixed fruit, raisins) 17 small grapes (3 ounces) 1 cup melon or berries  cup unsweetened  fruit juice  Milk 1 cup fat-free or reduced-fat milk 1 cup soy milk 2/3 cup (6 ounces) nonfat yogurt sweetened with sugar-free sweetener  Sweets and Desserts 2-inch square cake (unfrosted) 2 small cookies (2/3 ounce)  cup ice cream or frozen yogurt  cup sherbet or sorbet 1 tablespoon syrup, jam, jelly, table sugar, or honey 2 tablespoons light syrup  Other Foods Count 1 cup raw vegetables or  cup cooked nonstarchy vegetables as zero (0) carbohydrate servings or free foods. If you eat 3 or more servings at one meal, count them as 1 carbohydrate serving. Foods that have less than 20 calories in each serving also may be counted as zero carbohydrate servings or free foods. Count 1 cup of casserole or other mixed foods as 2 carbohydrate servings.  Carbohydrate Counting for People with Diabetes Sample 1-Day Menu Breakfast 1 extra-small banana (1 carbohydrate serving) 3/4 cup corn flakes (1 carbohydrate serving) 1 cup low-fat or fat-free milk (1 carbohydrate serving) 1 slice whole wheat bread (1 carbohydrate serving) 1 teaspoon margarine Lunch 2 ounces [place] slices 2 slices whole wheat bread (2 carbohydrate servings) 2 lettuce leaves 4 celery sticks 4 carrot sticks 1 medium apple (1 carbohydrate serving) 1 cup low-fat or fat-free milk (1 carbohydrate serving) Afternoon Snack 2 tablespoons raisins (1 carbohydrate serving) 3/4 ounce unsalted mini pretzels (1 carbohydrate serving) Evening Meal 3 ounces lean roast beef 1/2 large baked potato (2 carbohydrate servings) 1 tablespoon reduced-fat sour cream 1/2 cup green beans 1 cup vegetable salad 1 tablespoon light salad dressing 1 whole wheat dinner roll (1 carbohydrate serving) 1 teaspoon margarine 1 cup melon balls (1 carbohydrate serving) Evening Snack 6 ounces low-fat sugar-free fruit yogurt (1 carbohydrate serving) 2 tablespoons unsalted nuts  Diabetes Label Reading Tips Check the Nutrition Facts on food labels  for nutrient information for the food. The Nutrition Facts information is based on a standard serving size.  However, that serving size may not be the same as the serving size used in carbohydrate counting. Always start by checking the serving size on the label. Is this the serving size you will be eating?  How many servings are in the package? Next, look at the total carbohydrate. It is measured in grams (g).  To find the number of carbohydrate servings in 1 standard serving of a food, divide the total grams of carbohydrate by 15.  One (1) carbohydrate serving is the amount of food with 15 g carbohydrate. You dont need to count grams of sugars. They are included in the total carbohydrate. The label shows how many calories are in the standard serving. It also lists the amount of fat, cholesterol, sodium, protein, and some vitamins and minerals.  Look below the line listing total fat to find out how much of that fat is saturated fat or trans fat.  Choose foods that are low in these kinds of fats because they are not healthy for your heart.  In the foods that are healthiest for your heart, grams of saturated fat and trans fat are less than one-third of the total fat grams. If foods are very low in calories (less than 20 calories per serving) or carbohydrates (5 g  carbohydrate or less per serving), you may not need to count them when you count carbohydrates. Ask your registered dietitian nutritionist or diabetes educator about these free foods

## 2018-12-10 NOTE — Progress Notes (Signed)
Discharge instructions discussed with patient as well as daughter over the phone, verbalized agreement and understanding

## 2018-12-10 NOTE — Progress Notes (Signed)
Lab called with critical potassium level of 2.7 for patient,  MD on call paged and orders placed for three runs of IV potassium and 40 MEQ of PO potasium. Patient is stable and currently being monitored. Potassium will be administered. Dawson Bills, RN

## 2018-12-23 DIAGNOSIS — E119 Type 2 diabetes mellitus without complications: Secondary | ICD-10-CM | POA: Diagnosis not present

## 2018-12-23 DIAGNOSIS — K5289 Other specified noninfective gastroenteritis and colitis: Secondary | ICD-10-CM | POA: Diagnosis not present

## 2018-12-23 DIAGNOSIS — E782 Mixed hyperlipidemia: Secondary | ICD-10-CM | POA: Diagnosis not present

## 2018-12-23 DIAGNOSIS — I10 Essential (primary) hypertension: Secondary | ICD-10-CM | POA: Diagnosis not present

## 2018-12-23 DIAGNOSIS — I2510 Atherosclerotic heart disease of native coronary artery without angina pectoris: Secondary | ICD-10-CM | POA: Diagnosis not present

## 2018-12-23 DIAGNOSIS — Z8673 Personal history of transient ischemic attack (TIA), and cerebral infarction without residual deficits: Secondary | ICD-10-CM | POA: Diagnosis not present

## 2018-12-23 DIAGNOSIS — N3281 Overactive bladder: Secondary | ICD-10-CM | POA: Diagnosis not present

## 2018-12-28 DIAGNOSIS — R278 Other lack of coordination: Secondary | ICD-10-CM | POA: Diagnosis not present

## 2018-12-30 ENCOUNTER — Encounter: Payer: Self-pay | Admitting: Internal Medicine

## 2018-12-30 ENCOUNTER — Other Ambulatory Visit: Payer: Self-pay

## 2018-12-30 ENCOUNTER — Ambulatory Visit (INDEPENDENT_AMBULATORY_CARE_PROVIDER_SITE_OTHER): Payer: Medicare HMO | Admitting: Internal Medicine

## 2018-12-30 VITALS — BP 110/64 | HR 91 | Temp 98.0°F | Ht 64.0 in | Wt 146.9 lb

## 2018-12-30 DIAGNOSIS — K5641 Fecal impaction: Secondary | ICD-10-CM | POA: Diagnosis not present

## 2018-12-30 DIAGNOSIS — H6121 Impacted cerumen, right ear: Secondary | ICD-10-CM

## 2018-12-30 DIAGNOSIS — E1169 Type 2 diabetes mellitus with other specified complication: Secondary | ICD-10-CM

## 2018-12-30 DIAGNOSIS — N179 Acute kidney failure, unspecified: Secondary | ICD-10-CM

## 2018-12-30 DIAGNOSIS — I10 Essential (primary) hypertension: Secondary | ICD-10-CM | POA: Diagnosis not present

## 2018-12-30 DIAGNOSIS — E785 Hyperlipidemia, unspecified: Secondary | ICD-10-CM | POA: Diagnosis not present

## 2018-12-30 LAB — POCT GLYCOSYLATED HEMOGLOBIN (HGB A1C): Hemoglobin A1C: 5.8 % — AB (ref 4.0–5.6)

## 2018-12-30 MED ORDER — DOCUSATE SODIUM 100 MG PO CAPS: 100.0000 mg | ORAL_CAPSULE | Freq: Two times a day (BID) | ORAL | 11 refills | Status: DC

## 2018-12-30 MED ORDER — POLYETHYLENE GLYCOL 3350 17 GM/SCOOP PO POWD: 17.0000 g | Freq: Two times a day (BID) | ORAL | 1 refills | Status: DC | PRN

## 2018-12-30 MED ORDER — MAGNESIUM CITRATE PO SOLN: 1.0000 | Freq: Once | ORAL | 3 refills | Status: AC

## 2018-12-30 NOTE — Progress Notes (Signed)
New Patient Office Visit     CC/Reason for Visit: Establish care, hospital follow-up, discuss chronic conditions Previous PCP: In [place] Last Visit: Unknown  HPI: Stacey Armstrong is a 85 y.o. female who is coming in today for the above mentioned reasons.  She has moved from [place] to a retirement community called [place] to be closer to her daughter.  She was hospitalized from July 14 through July 17 due to fecal impaction and acute renal failure.  Fecal impaction ultimately resolved with colonoscopy prep.  She has a history of hypertension but was taken off all blood pressure medication while in the hospital and her blood pressure in office today is 110/64.  Since discharge from the hospital close to 3 weeks ago she has only had 3 bowel movements, she is taking MiraLAX daily.  She is extremely hard of hearing but has noticed a muffled sound in her right ear.  She also has a history of uncontrolled type 2 diabetes and hyperlipidemia on a statin.  I do not see any follow-up lab work since hospitalization in regards to her renal dysfunction.   Past Medical/Surgical History: Past Medical History:  Diagnosis Date  . Diabetes ([place])   . Hyperlipidemia   . Hypertension     Past Surgical History:  Procedure Laterality Date  . APPENDECTOMY    . CHOLECYSTECTOMY      Social History:  reports that she has quit smoking. She has never used smokeless tobacco. She reports previous alcohol use. No history on file for drug.  Allergies: Allergies  Allergen Reactions  . Penicillins Other (See Comments)    Unknown to pt daughter     Family History:  Family History  Problem Relation Age of Onset  . CAD Maternal Grandmother   . Pneumonia Maternal Grandfather      Current Outpatient Medications:  .  amitriptyline (ELAVIL) 25 MG tablet, Take 25 mg by mouth every other day., Disp: , Rfl:  .  aspirin EC 81 MG tablet, Take 81 mg by mouth daily., Disp: , Rfl:  .  atorvastatin  (LIPITOR) 20 MG tablet, Take 20 mg by mouth daily., Disp: , Rfl:  .  fluticasone (FLONASE) 50 MCG/ACT nasal spray, Place 1 spray into both nostrils 2 (two) times a day., Disp: , Rfl:  .  metFORMIN (GLUCOPHAGE) 1000 MG tablet, Take 1,000 mg by mouth 2 (two) times daily with a meal., Disp: , Rfl:  .  Multiple Vitamins-Minerals (CENTRUM SILVER 50+WOMEN) TABS, Take 1 tablet by mouth daily., Disp: , Rfl:  .  Omega-3 Fatty Acids (FISH OIL) 1200 MG CAPS, Take 1,000 mg by mouth 2 (two) times a day., Disp: , Rfl:  .  OVER THE COUNTER MEDICATION, Take 1 tablet by mouth daily. **Probiotic**, Disp: , Rfl:  .  Polyethyl Glycol-Propyl Glycol (SYSTANE ULTRA OP), Place 1 drop into both eyes as needed (dry eyes)., Disp: , Rfl:  .  VITAMIN D, CHOLECALCIFEROL, PO, Take 2,000 Units by mouth daily., Disp: , Rfl:  .  docusate sodium (COLACE) 100 MG capsule, Take 1 capsule (100 mg total) by mouth 2 (two) times daily., Disp: 60 capsule, Rfl: 11 .  magnesium citrate SOLN, Take 296 mLs (1 Bottle total) by mouth once for 1 dose. Take 1 bottle if no bowel movement in 3 days, Disp: 195 mL, Rfl: 3 .  polyethylene glycol powder (GLYCOLAX/MIRALAX) 17 GM/SCOOP powder, Take 17 g by mouth 2 (two) times daily as needed., Disp: 3350 g, Rfl: 1  Review of Systems:  Constitutional: Denies fever, chills, diaphoresis, appetite change and fatigue.  HEENT: Denies photophobia, eye pain, redness, hearing loss, ear pain, congestion, sore throat, rhinorrhea, sneezing, mouth sores, trouble swallowing, neck pain, neck stiffness and tinnitus.   Respiratory: Denies SOB, DOE, cough, chest tightness,  and wheezing.   Cardiovascular: Denies chest pain, palpitations and leg swelling.  Gastrointestinal: Denies nausea, vomiting, abdominal pain, diarrhea,  blood in stool and abdominal distention.  Genitourinary: Denies dysuria, urgency, frequency, hematuria, flank pain and difficulty urinating.  Endocrine: Denies: hot or cold intolerance, sweats, changes  in hair or nails, polyuria, polydipsia. Musculoskeletal: Denies myalgias, back pain, joint swelling, arthralgias and gait problem.  Skin: Denies pallor, rash and wound.  Neurological: Denies dizziness, seizures, syncope, weakness, light-headedness, numbness and headaches.  Hematological: Denies adenopathy. Easy bruising, personal or family bleeding history  Psychiatric/Behavioral: Denies suicidal ideation, mood changes, confusion, nervousness, sleep disturbance and agitation    Physical Exam: Vitals:   12/30/18 1429  BP: 110/64  Pulse: 91  Temp: 98 F (36.7 C)  TempSrc: Temporal  SpO2: 95%  Weight: 146 lb 14.4 oz (66.6 kg)  Height: $Remove'5\' 4"'rUowKMG$  (1.626 m)   Body mass index is 25.22 kg/m.  Constitutional: NAD, calm, comfortable Eyes: PERRL, lids and conjunctivae normal ENMT: Mucous membranes are moist.Tympanic membrane is pearly white, no erythema or bulging on the left, right is obstructed by cerumen. Neck: normal, supple, no masses, no thyromegaly Respiratory: clear to auscultation bilaterally, no wheezing, no crackles. Normal respiratory effort. No accessory muscle use.  Cardiovascular: Regular rate and rhythm, no murmurs / rubs / gallops. No extremity edema. 2+ pedal pulses. No carotid bruits.  Abdomen: no tenderness, no masses palpated. No hepatosplenomegaly. Bowel sounds positive.  Musculoskeletal: no clubbing / cyanosis. No joint deformity upper and lower extremities. Good ROM, no contractures. Normal muscle tone.  Skin: no rashes, lesions, ulcers. No induration Neurologic: CN 2-12 grossly intact. Sensation intact, DTR normal. Strength 5/5 in all 4.  Psychiatric: Normal judgment and insight. Alert and oriented x 3. Normal mood.    Impression and Plan:  Type 2 diabetes mellitus with hyperlipidemia (HCC) -A1c today is 5.8.  Continue metformin, she was taken off glipizide in the hospital, will keep off for now.  Essential hypertension -Well-controlled despite holding all blood  pressure medication.  Fecal impaction ([place])  -She was hospitalized for this for 3 days. -She has been taking MiraLAX daily, despite that has only had 3 bowel movements in about 20 days. -MiraLAX twice daily, Colace twice daily, will prescribe mag citrate for her to take a bottle if she has not had a bowel movement in 3 days.  Acute renal failure, unspecified acute renal failure type ([place])  -Check basic metabolic profile today to follow. -Creatinine on discharge was 0.72.  Right ear impacted cerumen -Cerumen Desimpaction  Warm water was applied and gentle ear lavage performed on right ear. There were no complications and following the desimpaction the tympanic membranes were visible. Tympanic membranes are intact following the procedure. Auditory canals are normal. The patient reported relief of symptoms after removal of cerumen.     Patient Instructions  -Nice meeting you today!!  -Lab work today; will notify you once results are available.  -Schedule follow up in 3 months.     Lelon Frohlich, MD [facility]

## 2018-12-30 NOTE — Patient Instructions (Addendum)
-  Nice meeting you today!!  -Lab work today; will notify you once results are available.  -Schedule follow up in 3 months.

## 2018-12-31 LAB — BASIC METABOLIC PANEL
BUN: 18 mg/dL (ref 6–23)
CO2: 28 mEq/L (ref 19–32)
Calcium: 9.6 mg/dL (ref 8.4–10.5)
Chloride: 98 mEq/L (ref 96–112)
Creatinine, Ser: 0.96 mg/dL (ref 0.40–1.20)
GFR: 54.74 mL/min — ABNORMAL LOW (ref >60.00)
Glucose, Bld: 89 mg/dL (ref 70–99)
Potassium: 4.1 mEq/L (ref 3.5–5.1)
Sodium: 137 mEq/L (ref 135–145)

## 2019-01-04 DIAGNOSIS — R278 Other lack of coordination: Secondary | ICD-10-CM | POA: Diagnosis not present

## 2019-01-06 DIAGNOSIS — R278 Other lack of coordination: Secondary | ICD-10-CM | POA: Diagnosis not present

## 2019-01-11 DIAGNOSIS — R278 Other lack of coordination: Secondary | ICD-10-CM | POA: Diagnosis not present

## 2019-01-13 DIAGNOSIS — R278 Other lack of coordination: Secondary | ICD-10-CM | POA: Diagnosis not present

## 2019-01-18 DIAGNOSIS — R278 Other lack of coordination: Secondary | ICD-10-CM | POA: Diagnosis not present

## 2019-01-20 DIAGNOSIS — R278 Other lack of coordination: Secondary | ICD-10-CM | POA: Diagnosis not present

## 2019-01-24 ENCOUNTER — Ambulatory Visit: Payer: Self-pay | Admitting: *Deleted

## 2019-01-24 ENCOUNTER — Emergency Department (HOSPITAL_COMMUNITY)
Admission: EM | Admit: 2019-01-24 | Discharge: 2019-01-24 | Disposition: A | Discharge status: Home / Self Care | Payer: Medicare HMO | Attending: Emergency Medicine | Admitting: Emergency Medicine

## 2019-01-24 ENCOUNTER — Emergency Department (HOSPITAL_COMMUNITY): Payer: Medicare HMO

## 2019-01-24 ENCOUNTER — Encounter (HOSPITAL_COMMUNITY): Payer: Self-pay

## 2019-01-24 ENCOUNTER — Other Ambulatory Visit: Payer: Self-pay

## 2019-01-24 DIAGNOSIS — Z87891 Personal history of nicotine dependence: Secondary | ICD-10-CM | POA: Diagnosis not present

## 2019-01-24 DIAGNOSIS — R51 Headache: Secondary | ICD-10-CM | POA: Insufficient documentation

## 2019-01-24 DIAGNOSIS — Z7984 Long term (current) use of oral hypoglycemic drugs: Secondary | ICD-10-CM | POA: Insufficient documentation

## 2019-01-24 DIAGNOSIS — I10 Essential (primary) hypertension: Secondary | ICD-10-CM | POA: Diagnosis not present

## 2019-01-24 DIAGNOSIS — R112 Nausea with vomiting, unspecified: Secondary | ICD-10-CM | POA: Insufficient documentation

## 2019-01-24 DIAGNOSIS — Z79899 Other long term (current) drug therapy: Secondary | ICD-10-CM | POA: Insufficient documentation

## 2019-01-24 DIAGNOSIS — Z7982 Long term (current) use of aspirin: Secondary | ICD-10-CM | POA: Diagnosis not present

## 2019-01-24 DIAGNOSIS — E119 Type 2 diabetes mellitus without complications: Secondary | ICD-10-CM | POA: Diagnosis not present

## 2019-01-24 DIAGNOSIS — R519 Headache, unspecified: Secondary | ICD-10-CM

## 2019-01-24 HISTORY — DX: Unspecified hearing loss, unspecified ear: H91.90

## 2019-01-24 LAB — CBC
HCT: 39 % (ref 36.0–46.0)
Hemoglobin: 12.2 g/dL (ref 12.0–15.0)
MCH: 28.2 pg (ref 26.0–34.0)
MCHC: 31.3 g/dL (ref 30.0–36.0)
MCV: 90.1 fL (ref 80.0–100.0)
Platelets: 311 10*3/uL (ref 150–400)
RBC: 4.33 MIL/uL (ref 3.87–5.11)
RDW: 14.8 % (ref 11.5–15.5)
WBC: 8.4 10*3/uL (ref 4.0–10.5)
nRBC: 0 % (ref 0.0–0.2)

## 2019-01-24 LAB — BASIC METABOLIC PANEL
Anion gap: 11 (ref 5–15)
BUN: 17 mg/dL (ref 8–23)
CO2: 28 mmol/L (ref 22–32)
Calcium: 9.4 mg/dL (ref 8.9–10.3)
Chloride: 101 mmol/L (ref 98–111)
Creatinine, Ser: 0.83 mg/dL (ref 0.44–1.00)
GFR calc Af Amer: >60 mL/min (ref >60)
GFR calc non Af Amer: >60 mL/min (ref >60)
Glucose, Bld: 103 mg/dL — ABNORMAL HIGH (ref 70–99)
Potassium: 3.8 mmol/L (ref 3.5–5.1)
Sodium: 140 mmol/L (ref 135–145)

## 2019-01-24 MED ORDER — BISOPROLOL-HYDROCHLOROTHIAZIDE 2.5-6.25 MG PO TABS
1.0000 | ORAL_TABLET | Freq: Every day | ORAL | Status: DC
  Administered 2019-01-24: 1 via ORAL
  Filled 2019-01-24: qty 1

## 2019-01-24 MED ORDER — AMLODIPINE BESYLATE 5 MG PO TABS
5.0000 mg | ORAL_TABLET | Freq: Once | ORAL | Status: AC
  Administered 2019-01-24: 5 mg via ORAL
  Filled 2019-01-24: qty 1

## 2019-01-24 MED ORDER — AMLODIPINE BESYLATE 5 MG PO TABS: 5.0000 mg | ORAL_TABLET | Freq: Every day | ORAL | 0 refills | Status: DC

## 2019-01-24 MED ORDER — ACETAMINOPHEN 325 MG PO TABS: 650.0000 mg | ORAL_TABLET | Freq: Once | ORAL | Status: DC

## 2019-01-24 MED ORDER — MORPHINE SULFATE (PF) 4 MG/ML IV SOLN
4.0000 mg | Freq: Once | INTRAVENOUS | Status: AC
  Administered 2019-01-24: 4 mg via INTRAVENOUS
  Filled 2019-01-24: qty 1

## 2019-01-24 MED ORDER — BISOPROLOL-HYDROCHLOROTHIAZIDE 2.5-6.25 MG PO TABS: 1.0000 | ORAL_TABLET | Freq: Every day | ORAL | 0 refills | Status: DC

## 2019-01-24 MED ORDER — HYDROCODONE-ACETAMINOPHEN 5-325 MG PO TABS
1.0000 | ORAL_TABLET | ORAL | Status: AC
  Administered 2019-01-24: 1 via ORAL
  Filled 2019-01-24: qty 1

## 2019-01-24 NOTE — ED Notes (Signed)
Patient transported to CT 

## 2019-01-24 NOTE — ED Provider Notes (Signed)
[facility] DEPT Provider Note   CSN: [id number] Arrival date & time: 01/24/19  1650     History   Chief Complaint Chief Complaint  Patient presents with  . Hypertension    HPI Stacey Armstrong is a 85 y.o. female.     HPI Patient presents to the ED for evaluation of high blood pressure and headache.  Patient had been living in [place].  She moved to [place] to be closer to her family.  Patient was admitted to the hospital back in July when she had an episode of nausea vomiting.  He was noted to be dehydrated and had acute kidney injury associated with colitis.  Patient was taken off all of her antihypertensive agents.  Patient had follow-up with her primary care doctor on August 6.  At that time the patient was still noted to have normal blood pressure despite not being on her medications.  She was not restarted on any other antihypertensives.  Patient's family has been taking her blood pressure at home.  They had noted that it had been increasing recently.  Today she had blood pressures up into at least the 190s  And even  above 628 systolic. They called the nurse hotline for her primary care doctor and she was instructed to come to the ED.  Patient denies any trouble with chest pain or shortness of breath but she has had a persistent headache.  She has had these in the past and usually she takes Excedrin Migraine.  She did not take 1 today.  She continues to have a bad headache.  She denies any trouble with numbness or weakness.  No difficulty with her speech or strength. Past Medical History:  Diagnosis Date  . Diabetes ([place])   . HOH (hard of hearing)   . Hyperlipidemia   . Hypertension     Patient Active Problem List   Diagnosis Date Noted  . Fecal impaction ([place]) 12/07/2018  . Type 2 diabetes mellitus with hyperlipidemia ([place]) 12/07/2018  . Essential hypertension 12/07/2018  . Diarrhea 12/07/2018  . Nausea and vomiting 12/07/2018  . ARF  (acute renal failure) ([place]) 12/07/2018    Past Surgical History:  Procedure Laterality Date  . APPENDECTOMY    . CHOLECYSTECTOMY       OB History   No obstetric history on file.      Home Medications    Prior to Admission medications   Medication Sig Start Date End Date Taking? Authorizing Provider  amitriptyline (ELAVIL) 25 MG tablet Take 25 mg by mouth every other day.   Yes [provider]  aspirin EC 81 MG tablet Take 81 mg by mouth daily.   Yes [provider]  atorvastatin (LIPITOR) 20 MG tablet Take 20 mg by mouth daily.   Yes [provider]  docusate sodium (COLACE) 100 MG capsule Take 1 capsule (100 mg total) by mouth 2 (two) times daily. 12/30/18  Yes [name], Rayford Halsted, MD  fluticasone [facility]) 50 MCG/ACT nasal spray Place 1 spray into both nostrils 2 (two) times a day.   Yes [provider]  metFORMIN (GLUCOPHAGE) 1000 MG tablet Take 1,000 mg by mouth 2 (two) times daily with a meal.   Yes [provider]  Multiple Vitamins-Minerals (CENTRUM SILVER 50+WOMEN) TABS Take 1 tablet by mouth daily.   Yes [provider]  Omega-3 Fatty Acids (FISH OIL) 1200 MG CAPS Take 1,000 mg by mouth 2 (two) times a day.   Yes  [provider]  OVER THE COUNTER MEDICATION Take 1 tablet by mouth daily. **Probiotic**   Yes [provider]  Polyethyl Glycol-Propyl Glycol (SYSTANE ULTRA OP) Place 1 drop into both eyes as needed (dry eyes).   Yes [provider]  polyethylene glycol powder (GLYCOLAX/MIRALAX) 17 GM/SCOOP powder Take 17 g by mouth 2 (two) times daily as needed. Patient taking differently: Take 17 g by mouth 2 (two) times daily as needed for mild constipation.  12/30/18  Yes [name], Rayford Halsted, MD  VITAMIN D, CHOLECALCIFEROL, PO Take 2,000 Units by mouth daily.   Yes [provider]  amLODipine (NORVASC) 5 MG tablet Take 1 tablet (5 mg total) by mouth daily. 01/24/19   Dorie Rank, MD   bisoprolol-hydrochlorothiazide [facility]) 2.5-6.25 MG tablet Take 1 tablet by mouth daily. 01/24/19   Dorie Rank, MD    Family History Family History  Problem Relation Age of Onset  . CAD Maternal Grandmother   . Pneumonia Maternal Grandfather     Social History Social History   Tobacco Use  . Smoking status: Former Research scientist (life sciences)  . Smokeless tobacco: Never Used  Substance Use Topics  . Alcohol use: Not Currently  . Drug use: Never     Allergies   Penicillins   Review of Systems Review of Systems  All other systems reviewed and are negative.    Physical Exam Updated Vital Signs BP (!) 187/90 (BP Location: Left Arm)   Pulse 88   Temp 98.1 F (36.7 C) (Oral)   Resp 20   Ht 1.626 m ($Remove'5\' 4"'ydykuQn$ )   Wt 69.9 kg   SpO2 97%   BMI 26.43 kg/m   Physical Exam Vitals signs and nursing note reviewed.  Constitutional:      General: She is not in acute distress.    Appearance: She is well-developed.  HENT:     Head: Normocephalic and atraumatic.     Right Ear: External ear normal.     Left Ear: External ear normal.  Eyes:     General: No scleral icterus.       Right eye: No discharge.        Left eye: No discharge.     Conjunctiva/sclera: Conjunctivae normal.  Neck:     Musculoskeletal: Neck supple.     Trachea: No tracheal deviation.  Cardiovascular:     Rate and Rhythm: Normal rate and regular rhythm.  Pulmonary:     Effort: Pulmonary effort is normal. No respiratory distress.     Breath sounds: Normal breath sounds. No stridor. No wheezing or rales.  Abdominal:     General: Bowel sounds are normal. There is no distension.     Palpations: Abdomen is soft.     Tenderness: There is no abdominal tenderness. There is no guarding or rebound.  Musculoskeletal:        General: No tenderness.  Skin:    General: Skin is warm and dry.     Findings: No rash.  Neurological:     Mental Status: She is alert and oriented to person, place, and time.     Cranial Nerves: No cranial  nerve deficit (No facial droop, extraocular movements intact, tongue midline ).     Sensory: No sensory deficit.     Motor: No abnormal muscle tone or seizure activity.     Coordination: Coordination normal.     Comments: Normal grip strength bilateral upper extremities, able to hold both legs off bed for 5 seconds, sensation intact in  all extremities, no visual field cuts, no left or right sided neglect,       ED Treatments / Results  Labs (all labs ordered are listed, but only abnormal results are displayed) Labs Reviewed  BASIC METABOLIC PANEL - Abnormal; Notable for the following components:      Result Value   Glucose, Bld 103 (*)    All other components within normal limits  CBC    EKG None  Radiology Ct Head Wo Contrast  Result Date: 01/24/2019 CLINICAL DATA:  Initial evaluation for acute severe headache. EXAM: CT HEAD WITHOUT CONTRAST TECHNIQUE: Contiguous axial images were obtained from the base of the skull through the vertex without intravenous contrast. COMPARISON:  None available. FINDINGS: Brain: Examination somewhat technically limited by lack of sagittal recon. Diffuse prominence of the CSF containing spaces compatible with generalized age-related cerebral atrophy. Patchy and confluent hypodensity within the periventricular and deep white matter both cerebral hemispheres most consistent with chronic small vessel ischemic disease, mild for age. Several scattered superimposed remote lacunar infarcts present within the right caudate and bilateral thalami. Punctate parenchymal calcification noted within the parasagittal right frontal lobe, of doubtful significance. No acute intracranial hemorrhage. No acute large vessel territory infarct. No mass lesion, midline shift or mass effect. No hydrocephalus. No extra-axial fluid collection. Vascular: No hyperdense vessel. Scattered vascular calcifications noted within the carotid siphons. Skull: Scalp soft tissues and calvarium within  normal limits. Sinuses/Orbits: Globes and orbital soft tissues within normal limits. Subcentimeter left ethmoidal sinus osteoma noted. Paranasal sinuses are largely clear. No mastoid effusion. Other: None. IMPRESSION: 1. No acute intracranial abnormality. 2. Age-related cerebral atrophy with chronic microvascular ischemic disease, with several scattered remote lacunar infarcts involving the right caudate and bilateral thalami. Electronically Signed   By: Jeannine Boga M.D.   On: 01/24/2019 23:20    Procedures Procedures (including critical care time)  Medications Ordered in ED Medications  bisoprolol-hydrochlorothiazide (ZIAC) 2.5-6.25 MG per tablet 1 tablet (has no administration in time range)  HYDROcodone-acetaminophen (NORCO/VICODIN) 5-325 MG per tablet 1 tablet (1 tablet Oral Given 01/24/19 2030)  morphine 4 MG/ML injection 4 mg (4 mg Intravenous Given 01/24/19 2224)  amLODipine (NORVASC) tablet 5 mg (5 mg Oral Given 01/24/19 2224)     Initial Impression / Assessment and Plan / ED Course  I have reviewed the triage vital signs and the nursing notes.  Pertinent labs & imaging results that were available during my care of the patient were reviewed by me and considered in my medical decision making (see chart for details).  Clinical Course as of Jan 24 2344  Mon Jan 24, 2019  2142 Still having a headache after the hydrocodone.   [JK]    Clinical Course User Index [JK] Dorie Rank, MD     ED work-up is reassuring.  Laboratory tests are unremarkable.  CT scan does not show any signs of acute hemorrhage.  Neurologic exam is normal without focal deficits.  Patient does have a history of some chronic headaches.  Possible that could be the culprit versus a component of her hypertension.  Patient has not been on any medication since she left the hospital.  I will start her back on her amlodipine and bisoprolol hydrochlorothiazide.  She may need to start back on her losartan if that does  not work but I will have her start those 2 medications and follow-up with her primary care doctor to review her blood pressure regimen.  Final Clinical Impressions(s) / ED Diagnoses  Final diagnoses:  Hypertension, unspecified type  Acute nonintractable headache, unspecified headache type    ED Discharge Orders         Ordered    amLODipine (NORVASC) 5 MG tablet  Daily     01/24/19 2329    bisoprolol-hydrochlorothiazide (ZIAC) 2.5-6.25 MG tablet  Daily     01/24/19 2329           Dorie Rank, MD 01/24/19 [phone]

## 2019-01-24 NOTE — Discharge Instructions (Signed)
Start taking the amlodipine and bisoprolol hydrochlorothiazide.  Follow-up with Dr. Jerilee Hoh to check on your blood pressure and whether or not to restart the losartan

## 2019-01-24 NOTE — Telephone Encounter (Signed)
Will monitor for ED arrival.  

## 2019-01-24 NOTE — ED Triage Notes (Signed)
Patient was in the hospital recently and was taken off of her BP meds. Patient has not been seen by a Home health nurse which was ordered. Last BP in triage 165/101. Patient's daughter called her PCP and was told to come to the ED. Patient does c/o headache.

## 2019-01-24 NOTE — ED Notes (Signed)
Pain decreased from 9 to 8. Patient requesting additional pain medication. EDP made aware. Will continue to monitor patient.

## 2019-01-24 NOTE — ED Notes (Signed)
Report given to [facility], RN. Care transferred at this time.

## 2019-01-24 NOTE — Telephone Encounter (Signed)
Pt's daughter called with her mom's b/p being elevated for the last few days. Since 08/21 she has had readings of 189/93, 230/103, 190/87 and today 199/98 and 193/109. The patient has c/o having a headache constantly. She has taken extra strength Excedrin for it and it may help a little but does not last.  Her daughter has tried several different b/p monitors to make sure these readings are correct. She has had readings as low as  100/85 but not today. She was taking off her b/p meds in the hospital per her daughter. So she is not on any meds for b/p now. Per protocol, she needs to be seen in the ED. Advised daughter and she will take her. Will route to the [facility] at [facility] for review.  Reason for Disposition . [6] Systolic BP  >= 962 OR Diastolic >= 952 AND [8] cardiac or neurologic symptoms (e.g., chest pain, difficulty breathing, unsteady gait, blurred vision)  Answer Assessment - Initial Assessment Questions 1. BLOOD PRESSURE: "What is the blood pressure?" "Did you take at least two measurements 5 minutes apart?"     199/98 and now 193/109 2. ONSET: "When did you take your blood pressure?"    This afternoon 3. HOW: "How did you obtain the blood pressure?" (e.g., visiting nurse, automatic home BP monitor)     Automatic home BP monitoir 4. HISTORY: "Do you have a history of high blood pressure?"     yes 5. MEDICATIONS: "Are you taking any medications for blood pressure?" "Have you missed any doses recently?"     Not on any medications now 6. OTHER SYMPTOMS: "Do you have any symptoms?" (e.g., headache, chest pain, blurred vision, difficulty breathing, weakness)     Headache,  7. PREGNANCY: "Is there any chance you are pregnant?" "When was your last menstrual period?"     n/a  Protocols used: HIGH BLOOD PRESSURE-A-AH

## 2019-01-25 DIAGNOSIS — R278 Other lack of coordination: Secondary | ICD-10-CM | POA: Diagnosis not present

## 2019-01-27 DIAGNOSIS — R278 Other lack of coordination: Secondary | ICD-10-CM | POA: Diagnosis not present

## 2019-01-28 ENCOUNTER — Telehealth: Payer: Self-pay | Admitting: Internal Medicine

## 2019-01-28 NOTE — Telephone Encounter (Signed)
Daughter states she has spoken to [facility], and Alvis Lemmings is willing to see the pt with dr orders  Please fax to: Fax  [phone]  2 x wk for bp checks and bowel checks  PT and OT evaluation  Pt needs orders asap, please.

## 2019-01-28 NOTE — Telephone Encounter (Signed)
Spoke with Jinny Blossom at [facility] and they do not have the staff to do the orders.

## 2019-01-28 NOTE — Telephone Encounter (Signed)
Okay to order?

## 2019-01-28 NOTE — Telephone Encounter (Signed)
Yes

## 2019-02-01 ENCOUNTER — Telehealth (INDEPENDENT_AMBULATORY_CARE_PROVIDER_SITE_OTHER): Payer: Medicare HMO | Admitting: Internal Medicine

## 2019-02-01 ENCOUNTER — Other Ambulatory Visit: Payer: Self-pay

## 2019-02-01 DIAGNOSIS — R278 Other lack of coordination: Secondary | ICD-10-CM | POA: Diagnosis not present

## 2019-02-01 DIAGNOSIS — R197 Diarrhea, unspecified: Secondary | ICD-10-CM

## 2019-02-01 DIAGNOSIS — I10 Essential (primary) hypertension: Secondary | ICD-10-CM

## 2019-02-01 MED ORDER — BISOPROLOL-HYDROCHLOROTHIAZIDE 2.5-6.25 MG PO TABS: 1.0000 | ORAL_TABLET | Freq: Every day | ORAL | 1 refills | Status: DC

## 2019-02-01 MED ORDER — AMLODIPINE BESYLATE 5 MG PO TABS: 5.0000 mg | ORAL_TABLET | Freq: Every day | ORAL | 1 refills | Status: DC

## 2019-02-01 NOTE — Progress Notes (Signed)
Virtual Visit via Video Note  I connected with Stacey Armstrong on 02/01/19 at  1:00 PM EDT by a video enabled telemedicine application and verified that I am speaking with the correct person using two identifiers.  Location patient: home Location provider: work office Persons participating in the virtual visit: patient, provider, daughter who assists with technology  I discussed the limitations of evaluation and management by telemedicine and the availability of in person appointments. The patient expressed understanding and agreed to proceed.   HPI: She has scheduled this visit as a follow-up to an ED visit.  She started to have headaches and elevated blood pressures above 200 prompting the daughter to bring her into the hospital.  She had recently been taken off of all blood pressure medications due to hypotension and acute renal failure.  When she followed up in the office following the initial hospitalization in July her blood pressure was 110/70 so we elected to observe her off of medication.  In the ED she was started on Norvasc 5 mg and Ziac 2.5/6.25 mg.  This was exactly 1 week ago.  Blood pressures have been hanging around the 875I to 433 systolic.  With a blood pressure today of 163/77.  She denies headache, shortness of breath, chest pains, blurry or double vision.  Of note she had also been having fecal impaction and I had elected to increase her MiraLAX and Colace to twice daily.  Daughter states she is now having 2-3 soft bowel movements a day and wonders what to do with stool softeners.  ROS: Constitutional: Denies fever, chills, diaphoresis, appetite change and fatigue.  HEENT: Denies photophobia, eye pain, redness, hearing loss, ear pain, congestion, sore throat, rhinorrhea, sneezing, mouth sores, trouble swallowing, neck pain, neck stiffness and tinnitus.   Respiratory: Denies SOB, DOE, cough, chest tightness,  and wheezing.   Cardiovascular: Denies chest pain, palpitations  and leg swelling.  Gastrointestinal: Denies nausea, vomiting, abdominal pain, constipation, blood in stool and abdominal distention.  Genitourinary: Denies dysuria, urgency, frequency, hematuria, flank pain and difficulty urinating.  Endocrine: Denies: hot or cold intolerance, sweats, changes in hair or nails, polyuria, polydipsia. Musculoskeletal: Denies myalgias, back pain, joint swelling, arthralgias and gait problem.  Skin: Denies pallor, rash and wound.  Neurological: Denies dizziness, seizures, syncope, weakness, light-headedness, numbness and headaches.  Hematological: Denies adenopathy. Easy bruising, personal or family bleeding history  Psychiatric/Behavioral: Denies suicidal ideation, mood changes, confusion, nervousness, sleep disturbance and agitation   Past Medical History:  Diagnosis Date  . Diabetes ([place])   . HOH (hard of hearing)   . Hyperlipidemia   . Hypertension     Past Surgical History:  Procedure Laterality Date  . APPENDECTOMY    . CHOLECYSTECTOMY      Family History  Problem Relation Age of Onset  . CAD Maternal Grandmother   . Pneumonia Maternal Grandfather     SOCIAL HX:   reports that she has quit smoking. She has never used smokeless tobacco. She reports previous alcohol use. She reports that she does not use drugs.   Current Outpatient Medications:  .  amitriptyline (ELAVIL) 25 MG tablet, Take 25 mg by mouth every other day., Disp: , Rfl:  .  amLODipine (NORVASC) 5 MG tablet, Take 1 tablet (5 mg total) by mouth daily., Disp: 90 tablet, Rfl: 1 .  aspirin EC 81 MG tablet, Take 81 mg by mouth daily., Disp: , Rfl:  .  atorvastatin (LIPITOR) 20 MG tablet, Take 20 mg by mouth  daily., Disp: , Rfl:  .  bisoprolol-hydrochlorothiazide (ZIAC) 2.5-6.25 MG tablet, Take 1 tablet by mouth daily., Disp: 90 tablet, Rfl: 1 .  docusate sodium (COLACE) 100 MG capsule, Take 1 capsule (100 mg total) by mouth 2 (two) times daily., Disp: 60 capsule, Rfl: 11 .   fluticasone (FLONASE) 50 MCG/ACT nasal spray, Place 1 spray into both nostrils 2 (two) times a day., Disp: , Rfl:  .  metFORMIN (GLUCOPHAGE) 1000 MG tablet, Take 1,000 mg by mouth 2 (two) times daily with a meal., Disp: , Rfl:  .  Multiple Vitamins-Minerals (CENTRUM SILVER 50+WOMEN) TABS, Take 1 tablet by mouth daily., Disp: , Rfl:  .  Omega-3 Fatty Acids (FISH OIL) 1200 MG CAPS, Take 1,000 mg by mouth 2 (two) times a day., Disp: , Rfl:  .  OVER THE COUNTER MEDICATION, Take 1 tablet by mouth daily. **Probiotic**, Disp: , Rfl:  .  Polyethyl Glycol-Propyl Glycol (SYSTANE ULTRA OP), Place 1 drop into both eyes as needed (dry eyes)., Disp: , Rfl:  .  polyethylene glycol powder (GLYCOLAX/MIRALAX) 17 GM/SCOOP powder, Take 17 g by mouth 2 (two) times daily as needed. (Patient taking differently: Take 17 g by mouth 2 (two) times daily as needed for mild constipation. ), Disp: 3350 g, Rfl: 1 .  VITAMIN D, CHOLECALCIFEROL, PO, Take 2,000 Units by mouth daily., Disp: , Rfl:   EXAM:   VITALS per patient if applicable: Blood pressure 163/77 today  GENERAL: alert, oriented, appears well and in no acute distress  HEENT: atraumatic, conjunttiva clear, no obvious abnormalities on inspection of external nose and ears, hard of hearing  NECK: normal movements of the head and neck  LUNGS: on inspection no signs of respiratory distress, breathing rate appears normal, no obvious gross increased work of breathing, gasping or wheezing  CV: no obvious cyanosis  MS: moves all visible extremities without noticeable abnormality  PSYCH/NEURO: pleasant and cooperative, no obvious depression or anxiety, speech and thought processing grossly intact  ASSESSMENT AND PLAN:   Essential hypertension  -No further medication changes today as these 3 medications were just added 1 week ago especially in light of her recent hospitalization for hypotension and acute renal failure. -Will arrange for a 4 to 6-week follow-up for  blood pressure. -Medication refills have been provided today.  Diarrhea, unspecified type -Back off to once daily MiraLAX and Colace to aim for 1 soft bowel movement at least every other day.     I discussed the assessment and treatment plan with the patient. The patient was provided an opportunity to ask questions and all were answered. The patient agreed with the plan and demonstrated an understanding of the instructions.   The patient was advised to call back or seek an in-person evaluation if the symptoms worsen or if the condition fails to improve as anticipated.    Lelon Frohlich, MD  [facility] at [facility]

## 2019-02-03 DIAGNOSIS — R278 Other lack of coordination: Secondary | ICD-10-CM | POA: Diagnosis not present

## 2019-02-08 DIAGNOSIS — R278 Other lack of coordination: Secondary | ICD-10-CM | POA: Diagnosis not present

## 2019-02-10 DIAGNOSIS — R278 Other lack of coordination: Secondary | ICD-10-CM | POA: Diagnosis not present

## 2019-02-14 ENCOUNTER — Telehealth: Payer: Self-pay | Admitting: Internal Medicine

## 2019-02-14 DIAGNOSIS — N179 Acute kidney failure, unspecified: Secondary | ICD-10-CM

## 2019-02-14 NOTE — Telephone Encounter (Signed)
Caller name: Museum/gallery conservator  Relation to pt: Transport planner with [employer] back number: [phone]    Reason for call:  Requesting orders for nursing, PT and OT fax # [phone] or [phone]  please note  Alternate fax # was given just in case [phone] did not go thru. Please fax orders, patient demographic, insurance and last OV,

## 2019-02-15 DIAGNOSIS — R69 Illness, unspecified: Secondary | ICD-10-CM | POA: Diagnosis not present

## 2019-02-15 DIAGNOSIS — R278 Other lack of coordination: Secondary | ICD-10-CM | POA: Diagnosis not present

## 2019-02-15 NOTE — Telephone Encounter (Signed)
Yes

## 2019-02-15 NOTE — Telephone Encounter (Signed)
Okay to give verbal orders?  ?

## 2019-02-15 NOTE — Telephone Encounter (Signed)
Spoke with [employer] they need a rx for nursing, OT and PT, along with demographics, insurance and last OV notes.

## 2019-02-16 NOTE — Telephone Encounter (Signed)
Orders placed in EPIC

## 2019-02-17 DIAGNOSIS — R278 Other lack of coordination: Secondary | ICD-10-CM | POA: Diagnosis not present

## 2019-02-17 NOTE — Telephone Encounter (Signed)
[facility] called in stating they do not use Epic for [place]  and are needing it faxed to [phone]. Please advise.

## 2019-02-22 DIAGNOSIS — R278 Other lack of coordination: Secondary | ICD-10-CM | POA: Diagnosis not present

## 2019-02-23 NOTE — Telephone Encounter (Signed)
Faxed and confirmed

## 2019-02-24 DIAGNOSIS — R278 Other lack of coordination: Secondary | ICD-10-CM | POA: Diagnosis not present

## 2019-02-28 ENCOUNTER — Other Ambulatory Visit: Payer: Self-pay

## 2019-03-01 ENCOUNTER — Ambulatory Visit: Payer: Medicare HMO | Admitting: Internal Medicine

## 2019-03-03 DIAGNOSIS — M25562 Pain in left knee: Secondary | ICD-10-CM | POA: Diagnosis not present

## 2019-03-03 DIAGNOSIS — M6281 Muscle weakness (generalized): Secondary | ICD-10-CM | POA: Diagnosis not present

## 2019-03-03 DIAGNOSIS — R278 Other lack of coordination: Secondary | ICD-10-CM | POA: Diagnosis not present

## 2019-03-04 ENCOUNTER — Encounter: Payer: Self-pay | Admitting: Internal Medicine

## 2019-03-04 ENCOUNTER — Ambulatory Visit (INDEPENDENT_AMBULATORY_CARE_PROVIDER_SITE_OTHER): Payer: Medicare HMO | Admitting: Internal Medicine

## 2019-03-04 ENCOUNTER — Other Ambulatory Visit: Payer: Self-pay

## 2019-03-04 VITALS — BP 130/60 | HR 70 | Temp 97.7°F | Wt 151.8 lb

## 2019-03-04 DIAGNOSIS — E1169 Type 2 diabetes mellitus with other specified complication: Secondary | ICD-10-CM | POA: Diagnosis not present

## 2019-03-04 DIAGNOSIS — I10 Essential (primary) hypertension: Secondary | ICD-10-CM

## 2019-03-04 DIAGNOSIS — H6121 Impacted cerumen, right ear: Secondary | ICD-10-CM | POA: Diagnosis not present

## 2019-03-04 DIAGNOSIS — E785 Hyperlipidemia, unspecified: Secondary | ICD-10-CM | POA: Diagnosis not present

## 2019-03-04 DIAGNOSIS — N179 Acute kidney failure, unspecified: Secondary | ICD-10-CM

## 2019-03-04 NOTE — Progress Notes (Signed)
Established Patient Office Visit     CC/Reason for Visit: "I need my ears cleaned" f/u chronic conditions  HPI: Stacey Armstrong is a 85 y.o. female who is coming in today for the above mentioned reasons. Past Medical History is significant for: Hypertension that is not well controlled for which we have been adjusting her medications.  Well-controlled type 2 diabetes, hyperlipidemia on a statin.  She went to see her audiologist because her hearing aids were not working well and was told she had significant cerumen impaction was sent here for evaluation.  Daughter continues to have concerns about blood pressure last few measurements have been: 166/83, 135/83, 154/117, 214/87, 195/87, 181/78, 144/81.  Have been having issues with her current home health agency and they would like to switch to a different one.   Past Medical/Surgical History: Past Medical History:  Diagnosis Date  . Diabetes ([place])   . HOH (hard of hearing)   . Hyperlipidemia   . Hypertension     Past Surgical History:  Procedure Laterality Date  . APPENDECTOMY    . CHOLECYSTECTOMY      Social History:  reports that she has quit smoking. She has never used smokeless tobacco. She reports previous alcohol use. She reports that she does not use drugs.  Allergies: Allergies  Allergen Reactions  . Penicillins Other (See Comments)    Unknown to pt daughter     Family History:  Family History  Problem Relation Age of Onset  . CAD Maternal Grandmother   . Pneumonia Maternal Grandfather      Current Outpatient Medications:  .  amitriptyline (ELAVIL) 25 MG tablet, Take 25 mg by mouth every other day., Disp: , Rfl:  .  amLODipine (NORVASC) 5 MG tablet, Take 1 tablet (5 mg total) by mouth daily., Disp: 90 tablet, Rfl: 1 .  aspirin EC 81 MG tablet, Take 81 mg by mouth daily., Disp: , Rfl:  .  atorvastatin (LIPITOR) 20 MG tablet, Take 20 mg by mouth daily., Disp: , Rfl:  .  bisoprolol-hydrochlorothiazide (ZIAC)  2.5-6.25 MG tablet, Take 1 tablet by mouth daily., Disp: 90 tablet, Rfl: 1 .  docusate sodium (COLACE) 100 MG capsule, Take 1 capsule (100 mg total) by mouth 2 (two) times daily., Disp: 60 capsule, Rfl: 11 .  fluticasone (FLONASE) 50 MCG/ACT nasal spray, Place 1 spray into both nostrils 2 (two) times a day., Disp: , Rfl:  .  metFORMIN (GLUCOPHAGE) 1000 MG tablet, Take 1,000 mg by mouth 2 (two) times daily with a meal., Disp: , Rfl:  .  Multiple Vitamins-Minerals (CENTRUM SILVER 50+WOMEN) TABS, Take 1 tablet by mouth daily., Disp: , Rfl:  .  Omega-3 Fatty Acids (FISH OIL) 1200 MG CAPS, Take 1,000 mg by mouth 2 (two) times a day., Disp: , Rfl:  .  OVER THE COUNTER MEDICATION, Take 1 tablet by mouth daily. **Probiotic**, Disp: , Rfl:  .  Polyethyl Glycol-Propyl Glycol (SYSTANE ULTRA OP), Place 1 drop into both eyes as needed (dry eyes)., Disp: , Rfl:  .  polyethylene glycol powder (GLYCOLAX/MIRALAX) 17 GM/SCOOP powder, Take 17 g by mouth 2 (two) times daily as needed. (Patient taking differently: Take 17 g by mouth 2 (two) times daily as needed for mild constipation. ), Disp: 3350 g, Rfl: 1 .  VITAMIN D, CHOLECALCIFEROL, PO, Take 2,000 Units by mouth daily., Disp: , Rfl:   Review of Systems:  Constitutional: Denies fever, chills, diaphoresis, appetite change and fatigue.  HEENT: Denies photophobia, eye pain,  redness,ear pain, congestion, sore throat, rhinorrhea, sneezing, mouth sores, trouble swallowing, neck pain, neck stiffness and tinnitus.   Respiratory: Denies SOB, DOE, cough, chest tightness,  and wheezing.   Cardiovascular: Denies chest pain, palpitations and leg swelling.  Gastrointestinal: Denies nausea, vomiting, abdominal pain, diarrhea, constipation, blood in stool and abdominal distention.  Genitourinary: Denies dysuria, urgency, frequency, hematuria, flank pain and difficulty urinating.  Endocrine: Denies: hot or cold intolerance, sweats, changes in hair or nails, polyuria, polydipsia.  Musculoskeletal: Denies myalgias, back pain, joint swelling, arthralgias and gait problem.  Skin: Denies pallor, rash and wound.  Neurological: Denies dizziness, seizures, syncope, weakness, light-headedness, numbness and headaches.  Hematological: Denies adenopathy. Easy bruising, personal or family bleeding history  Psychiatric/Behavioral: Denies suicidal ideation, mood changes, confusion, nervousness, sleep disturbance and agitation    Physical Exam: Vitals:   03/04/19 1406 03/04/19 1527 03/04/19 1528  BP: 128/70 120/62 130/60  Pulse: 70    Temp: 97.7 F (36.5 C)    TempSrc: Temporal    SpO2: 97%    Weight: 151 lb 12.8 oz (68.9 kg)      Body mass index is 26.06 kg/m.   Constitutional: NAD, calm, comfortable, very hard of hearing Eyes: PERRL, lids and conjunctivae normal, wears corrective lenses ENMT: Mucous membranes are moist.  Tympanic membrane is pearly white, no erythema or bulging on the left, right is obstructed by cerumen Neck: normal, supple, no masses, no thyromegaly Respiratory: clear to auscultation bilaterally, no wheezing, no crackles. Normal respiratory effort. No accessory muscle use.  Cardiovascular: Regular rate and rhythm, no murmurs / rubs / gallops. No extremity edema. 2+ pedal pulses. No carotid bruits.  Abdomen: no tenderness, no masses palpated. No hepatosplenomegaly. Bowel sounds positive.  Musculoskeletal: no clubbing / cyanosis. No joint deformity upper and lower extremities. Good ROM, no contractures. Normal muscle tone.  Skin: no rashes, lesions, ulcers. No induration Neurologic: CN 2-12 grossly intact. Sensation intact, DTR normal. Strength 5/5 in all 4.  Psychiatric: Normal judgment and insight. Alert and oriented x 3. Normal mood.    Impression and Plan:  Hearing loss of right ear due to cerumen impaction -Cerumen Desimpaction  Warm water was applied and gentle ear lavage performed on the right ear. There were no complications and following  the desimpaction the tympanic membranes were visible. Tympanic membranes are intact following the procedure. Auditory canals are normal. The patient reported relief of symptoms after removal of cerumen.   Type 2 diabetes mellitus with hyperlipidemia ([place]) -Well-controlled with a recent A1c of 5.8 in August.  Essential hypertension -Despite ambulatory measurements being elevated, 3 separate measurements in office have been 120/62, 128/70, 130/60. -Suspect malfunction of home blood pressure cuff. -Continue current regimen, follow-up in 3 months.    Patient Instructions  -Nice seeing you today!!  -See you back in 3 months.     Lelon Frohlich, MD [facility] at [facility]

## 2019-03-04 NOTE — Patient Instructions (Signed)
-  Nice seeing you today!!  -See you back in 3 months. 

## 2019-03-07 ENCOUNTER — Encounter: Payer: Self-pay | Admitting: Internal Medicine

## 2019-03-07 DIAGNOSIS — E785 Hyperlipidemia, unspecified: Secondary | ICD-10-CM | POA: Diagnosis not present

## 2019-03-07 DIAGNOSIS — E119 Type 2 diabetes mellitus without complications: Secondary | ICD-10-CM | POA: Diagnosis not present

## 2019-03-07 DIAGNOSIS — Z87891 Personal history of nicotine dependence: Secondary | ICD-10-CM | POA: Diagnosis not present

## 2019-03-07 DIAGNOSIS — Z7951 Long term (current) use of inhaled steroids: Secondary | ICD-10-CM | POA: Diagnosis not present

## 2019-03-07 DIAGNOSIS — Z7984 Long term (current) use of oral hypoglycemic drugs: Secondary | ICD-10-CM | POA: Diagnosis not present

## 2019-03-07 DIAGNOSIS — I10 Essential (primary) hypertension: Secondary | ICD-10-CM | POA: Diagnosis not present

## 2019-03-07 DIAGNOSIS — Z7982 Long term (current) use of aspirin: Secondary | ICD-10-CM | POA: Diagnosis not present

## 2019-03-07 DIAGNOSIS — Z9181 History of falling: Secondary | ICD-10-CM | POA: Diagnosis not present

## 2019-03-07 DIAGNOSIS — H6121 Impacted cerumen, right ear: Secondary | ICD-10-CM | POA: Diagnosis not present

## 2019-03-08 DIAGNOSIS — E785 Hyperlipidemia, unspecified: Secondary | ICD-10-CM | POA: Diagnosis not present

## 2019-03-08 DIAGNOSIS — I10 Essential (primary) hypertension: Secondary | ICD-10-CM | POA: Diagnosis not present

## 2019-03-08 DIAGNOSIS — Z7951 Long term (current) use of inhaled steroids: Secondary | ICD-10-CM | POA: Diagnosis not present

## 2019-03-08 DIAGNOSIS — E119 Type 2 diabetes mellitus without complications: Secondary | ICD-10-CM | POA: Diagnosis not present

## 2019-03-08 DIAGNOSIS — Z7984 Long term (current) use of oral hypoglycemic drugs: Secondary | ICD-10-CM | POA: Diagnosis not present

## 2019-03-08 DIAGNOSIS — Z87891 Personal history of nicotine dependence: Secondary | ICD-10-CM | POA: Diagnosis not present

## 2019-03-08 DIAGNOSIS — H6121 Impacted cerumen, right ear: Secondary | ICD-10-CM | POA: Diagnosis not present

## 2019-03-08 DIAGNOSIS — Z9181 History of falling: Secondary | ICD-10-CM | POA: Diagnosis not present

## 2019-03-08 DIAGNOSIS — Z7982 Long term (current) use of aspirin: Secondary | ICD-10-CM | POA: Diagnosis not present

## 2019-03-10 DIAGNOSIS — H6121 Impacted cerumen, right ear: Secondary | ICD-10-CM | POA: Diagnosis not present

## 2019-03-10 DIAGNOSIS — E785 Hyperlipidemia, unspecified: Secondary | ICD-10-CM | POA: Diagnosis not present

## 2019-03-10 DIAGNOSIS — Z87891 Personal history of nicotine dependence: Secondary | ICD-10-CM | POA: Diagnosis not present

## 2019-03-10 DIAGNOSIS — Z9181 History of falling: Secondary | ICD-10-CM | POA: Diagnosis not present

## 2019-03-10 DIAGNOSIS — I10 Essential (primary) hypertension: Secondary | ICD-10-CM | POA: Diagnosis not present

## 2019-03-10 DIAGNOSIS — E119 Type 2 diabetes mellitus without complications: Secondary | ICD-10-CM | POA: Diagnosis not present

## 2019-03-10 DIAGNOSIS — Z7984 Long term (current) use of oral hypoglycemic drugs: Secondary | ICD-10-CM | POA: Diagnosis not present

## 2019-03-10 DIAGNOSIS — Z7982 Long term (current) use of aspirin: Secondary | ICD-10-CM | POA: Diagnosis not present

## 2019-03-10 DIAGNOSIS — Z7951 Long term (current) use of inhaled steroids: Secondary | ICD-10-CM | POA: Diagnosis not present

## 2019-03-14 DIAGNOSIS — Z7982 Long term (current) use of aspirin: Secondary | ICD-10-CM | POA: Diagnosis not present

## 2019-03-14 DIAGNOSIS — H6121 Impacted cerumen, right ear: Secondary | ICD-10-CM | POA: Diagnosis not present

## 2019-03-14 DIAGNOSIS — E785 Hyperlipidemia, unspecified: Secondary | ICD-10-CM | POA: Diagnosis not present

## 2019-03-14 DIAGNOSIS — E119 Type 2 diabetes mellitus without complications: Secondary | ICD-10-CM | POA: Diagnosis not present

## 2019-03-14 DIAGNOSIS — Z7951 Long term (current) use of inhaled steroids: Secondary | ICD-10-CM | POA: Diagnosis not present

## 2019-03-14 DIAGNOSIS — I10 Essential (primary) hypertension: Secondary | ICD-10-CM | POA: Diagnosis not present

## 2019-03-14 DIAGNOSIS — Z7984 Long term (current) use of oral hypoglycemic drugs: Secondary | ICD-10-CM | POA: Diagnosis not present

## 2019-03-14 DIAGNOSIS — Z87891 Personal history of nicotine dependence: Secondary | ICD-10-CM | POA: Diagnosis not present

## 2019-03-14 DIAGNOSIS — Z9181 History of falling: Secondary | ICD-10-CM | POA: Diagnosis not present

## 2019-03-15 DIAGNOSIS — Z7982 Long term (current) use of aspirin: Secondary | ICD-10-CM | POA: Diagnosis not present

## 2019-03-15 DIAGNOSIS — Z9181 History of falling: Secondary | ICD-10-CM | POA: Diagnosis not present

## 2019-03-15 DIAGNOSIS — E785 Hyperlipidemia, unspecified: Secondary | ICD-10-CM | POA: Diagnosis not present

## 2019-03-15 DIAGNOSIS — Z87891 Personal history of nicotine dependence: Secondary | ICD-10-CM | POA: Diagnosis not present

## 2019-03-15 DIAGNOSIS — H6121 Impacted cerumen, right ear: Secondary | ICD-10-CM | POA: Diagnosis not present

## 2019-03-15 DIAGNOSIS — Z7984 Long term (current) use of oral hypoglycemic drugs: Secondary | ICD-10-CM | POA: Diagnosis not present

## 2019-03-15 DIAGNOSIS — Z7951 Long term (current) use of inhaled steroids: Secondary | ICD-10-CM | POA: Diagnosis not present

## 2019-03-15 DIAGNOSIS — I10 Essential (primary) hypertension: Secondary | ICD-10-CM | POA: Diagnosis not present

## 2019-03-15 DIAGNOSIS — E119 Type 2 diabetes mellitus without complications: Secondary | ICD-10-CM | POA: Diagnosis not present

## 2019-03-17 DIAGNOSIS — Z7982 Long term (current) use of aspirin: Secondary | ICD-10-CM | POA: Diagnosis not present

## 2019-03-17 DIAGNOSIS — E785 Hyperlipidemia, unspecified: Secondary | ICD-10-CM | POA: Diagnosis not present

## 2019-03-17 DIAGNOSIS — Z7951 Long term (current) use of inhaled steroids: Secondary | ICD-10-CM | POA: Diagnosis not present

## 2019-03-17 DIAGNOSIS — E119 Type 2 diabetes mellitus without complications: Secondary | ICD-10-CM | POA: Diagnosis not present

## 2019-03-17 DIAGNOSIS — Z7984 Long term (current) use of oral hypoglycemic drugs: Secondary | ICD-10-CM | POA: Diagnosis not present

## 2019-03-17 DIAGNOSIS — H6121 Impacted cerumen, right ear: Secondary | ICD-10-CM | POA: Diagnosis not present

## 2019-03-17 DIAGNOSIS — I10 Essential (primary) hypertension: Secondary | ICD-10-CM | POA: Diagnosis not present

## 2019-03-17 DIAGNOSIS — Z9181 History of falling: Secondary | ICD-10-CM | POA: Diagnosis not present

## 2019-03-17 DIAGNOSIS — Z87891 Personal history of nicotine dependence: Secondary | ICD-10-CM | POA: Diagnosis not present

## 2019-03-29 DIAGNOSIS — Z7982 Long term (current) use of aspirin: Secondary | ICD-10-CM | POA: Diagnosis not present

## 2019-03-29 DIAGNOSIS — I10 Essential (primary) hypertension: Secondary | ICD-10-CM | POA: Diagnosis not present

## 2019-03-29 DIAGNOSIS — Z87891 Personal history of nicotine dependence: Secondary | ICD-10-CM | POA: Diagnosis not present

## 2019-03-29 DIAGNOSIS — E785 Hyperlipidemia, unspecified: Secondary | ICD-10-CM | POA: Diagnosis not present

## 2019-03-29 DIAGNOSIS — Z7984 Long term (current) use of oral hypoglycemic drugs: Secondary | ICD-10-CM | POA: Diagnosis not present

## 2019-03-29 DIAGNOSIS — H6121 Impacted cerumen, right ear: Secondary | ICD-10-CM | POA: Diagnosis not present

## 2019-03-29 DIAGNOSIS — E119 Type 2 diabetes mellitus without complications: Secondary | ICD-10-CM | POA: Diagnosis not present

## 2019-03-29 DIAGNOSIS — Z7951 Long term (current) use of inhaled steroids: Secondary | ICD-10-CM | POA: Diagnosis not present

## 2019-03-29 DIAGNOSIS — Z9181 History of falling: Secondary | ICD-10-CM | POA: Diagnosis not present

## 2019-04-04 DIAGNOSIS — R69 Illness, unspecified: Secondary | ICD-10-CM | POA: Diagnosis not present

## 2019-04-05 DIAGNOSIS — Z7984 Long term (current) use of oral hypoglycemic drugs: Secondary | ICD-10-CM | POA: Diagnosis not present

## 2019-04-05 DIAGNOSIS — H6121 Impacted cerumen, right ear: Secondary | ICD-10-CM | POA: Diagnosis not present

## 2019-04-05 DIAGNOSIS — E119 Type 2 diabetes mellitus without complications: Secondary | ICD-10-CM | POA: Diagnosis not present

## 2019-04-05 DIAGNOSIS — Z87891 Personal history of nicotine dependence: Secondary | ICD-10-CM | POA: Diagnosis not present

## 2019-04-05 DIAGNOSIS — I10 Essential (primary) hypertension: Secondary | ICD-10-CM | POA: Diagnosis not present

## 2019-04-05 DIAGNOSIS — Z7951 Long term (current) use of inhaled steroids: Secondary | ICD-10-CM | POA: Diagnosis not present

## 2019-04-05 DIAGNOSIS — Z7982 Long term (current) use of aspirin: Secondary | ICD-10-CM | POA: Diagnosis not present

## 2019-04-05 DIAGNOSIS — Z9181 History of falling: Secondary | ICD-10-CM | POA: Diagnosis not present

## 2019-04-05 DIAGNOSIS — E785 Hyperlipidemia, unspecified: Secondary | ICD-10-CM | POA: Diagnosis not present

## 2019-04-12 DIAGNOSIS — H6121 Impacted cerumen, right ear: Secondary | ICD-10-CM | POA: Diagnosis not present

## 2019-04-12 DIAGNOSIS — Z7984 Long term (current) use of oral hypoglycemic drugs: Secondary | ICD-10-CM | POA: Diagnosis not present

## 2019-04-12 DIAGNOSIS — Z7951 Long term (current) use of inhaled steroids: Secondary | ICD-10-CM | POA: Diagnosis not present

## 2019-04-12 DIAGNOSIS — Z7982 Long term (current) use of aspirin: Secondary | ICD-10-CM | POA: Diagnosis not present

## 2019-04-12 DIAGNOSIS — E119 Type 2 diabetes mellitus without complications: Secondary | ICD-10-CM | POA: Diagnosis not present

## 2019-04-12 DIAGNOSIS — E785 Hyperlipidemia, unspecified: Secondary | ICD-10-CM | POA: Diagnosis not present

## 2019-04-12 DIAGNOSIS — Z9181 History of falling: Secondary | ICD-10-CM | POA: Diagnosis not present

## 2019-04-12 DIAGNOSIS — I10 Essential (primary) hypertension: Secondary | ICD-10-CM | POA: Diagnosis not present

## 2019-04-12 DIAGNOSIS — Z87891 Personal history of nicotine dependence: Secondary | ICD-10-CM | POA: Diagnosis not present

## 2019-04-19 DIAGNOSIS — Z9181 History of falling: Secondary | ICD-10-CM | POA: Diagnosis not present

## 2019-04-19 DIAGNOSIS — E119 Type 2 diabetes mellitus without complications: Secondary | ICD-10-CM | POA: Diagnosis not present

## 2019-04-19 DIAGNOSIS — Z7982 Long term (current) use of aspirin: Secondary | ICD-10-CM | POA: Diagnosis not present

## 2019-04-19 DIAGNOSIS — I10 Essential (primary) hypertension: Secondary | ICD-10-CM | POA: Diagnosis not present

## 2019-04-19 DIAGNOSIS — E785 Hyperlipidemia, unspecified: Secondary | ICD-10-CM | POA: Diagnosis not present

## 2019-04-19 DIAGNOSIS — Z7984 Long term (current) use of oral hypoglycemic drugs: Secondary | ICD-10-CM | POA: Diagnosis not present

## 2019-04-19 DIAGNOSIS — H6121 Impacted cerumen, right ear: Secondary | ICD-10-CM | POA: Diagnosis not present

## 2019-04-19 DIAGNOSIS — Z7951 Long term (current) use of inhaled steroids: Secondary | ICD-10-CM | POA: Diagnosis not present

## 2019-04-19 DIAGNOSIS — Z87891 Personal history of nicotine dependence: Secondary | ICD-10-CM | POA: Diagnosis not present

## 2019-05-18 DIAGNOSIS — R69 Illness, unspecified: Secondary | ICD-10-CM | POA: Diagnosis not present

## 2019-06-02 ENCOUNTER — Telehealth: Payer: Self-pay | Admitting: Internal Medicine

## 2019-06-02 DIAGNOSIS — I10 Essential (primary) hypertension: Secondary | ICD-10-CM

## 2019-06-02 DIAGNOSIS — Z20828 Contact with and (suspected) exposure to other viral communicable diseases: Secondary | ICD-10-CM | POA: Diagnosis not present

## 2019-06-02 DIAGNOSIS — Z1383 Encounter for screening for respiratory disorder NEC: Secondary | ICD-10-CM | POA: Diagnosis not present

## 2019-06-02 NOTE — Telephone Encounter (Signed)
Pt needs a new glucose meter and supplies as well as a refill on all her medications from Dr. Jerilee Hoh. Pt has new insurance with [employer] /   Send refills to  [facility] [place] Phone:  [phone]  Fax:  [phone]

## 2019-06-02 NOTE — Telephone Encounter (Signed)
Message Routed to PCP CMA 

## 2019-06-03 MED ORDER — AMLODIPINE BESYLATE 5 MG PO TABS: 5.0000 mg | ORAL_TABLET | Freq: Every day | ORAL | 1 refills | Status: DC

## 2019-06-03 MED ORDER — METFORMIN HCL 1000 MG PO TABS: 1000.0000 mg | ORAL_TABLET | Freq: Two times a day (BID) | ORAL | 1 refills | Status: DC

## 2019-06-03 MED ORDER — BISOPROLOL-HYDROCHLOROTHIAZIDE 2.5-6.25 MG PO TABS: 1.0000 | ORAL_TABLET | Freq: Every day | ORAL | 1 refills | Status: DC

## 2019-06-03 MED ORDER — ATORVASTATIN CALCIUM 20 MG PO TABS: 20.0000 mg | ORAL_TABLET | Freq: Every day | ORAL | 1 refills | Status: DC

## 2019-06-03 MED ORDER — AMITRIPTYLINE HCL 25 MG PO TABS: 25.0000 mg | ORAL_TABLET | ORAL | 1 refills | Status: DC

## 2019-06-03 NOTE — Telephone Encounter (Signed)
Left message on machine for patient to return our call.  Which glucometer would she like?  Refills sent  CRM

## 2019-06-06 ENCOUNTER — Telehealth: Payer: Self-pay | Admitting: *Deleted

## 2019-06-06 NOTE — Telephone Encounter (Signed)
Copied from [place] [phone]. Topic: Quick Communication - See Telephone Encounter >> Jun 03, 2019 11:40 AM Lamarr Lulas, CMA wrote: CRM for notification. See Telephone encounter for: 06/03/19. Okay for triage nurse to speak with patient  Which glucometer? >> Jun 06, 2019  3:32 PM Alanda Slim E wrote: The glucose meter is Accu-check guide Pt aslo needs lancits and test strips / please advise

## 2019-06-07 MED ORDER — ACCU-CHEK GUIDE ME W/DEVICE KIT: 1.0000 | PACK | Freq: Every day | 0 refills | Status: DC

## 2019-06-07 MED ORDER — ACCU-CHEK GUIDE VI STRP: 1.0000 | ORAL_STRIP | Freq: Every day | 12 refills | Status: DC

## 2019-06-07 MED ORDER — ACCU-CHEK FASTCLIX LANCETS MISC: 1.0000 | Freq: Every day | 3 refills | Status: DC

## 2019-06-07 NOTE — Telephone Encounter (Signed)
Rx sent 

## 2019-06-07 NOTE — Addendum Note (Signed)
Addended by: Westley Hummer B on: 06/07/2019 01:36 PM   Modules accepted: Orders

## 2019-06-22 DIAGNOSIS — Z23 Encounter for immunization: Secondary | ICD-10-CM | POA: Diagnosis not present

## 2019-06-29 DIAGNOSIS — H25813 Combined forms of age-related cataract, bilateral: Secondary | ICD-10-CM | POA: Diagnosis not present

## 2019-07-08 ENCOUNTER — Telehealth: Payer: Self-pay | Admitting: Internal Medicine

## 2019-07-08 ENCOUNTER — Encounter: Payer: Self-pay | Admitting: Internal Medicine

## 2019-07-08 ENCOUNTER — Ambulatory Visit (INDEPENDENT_AMBULATORY_CARE_PROVIDER_SITE_OTHER): Payer: Medicare HMO | Admitting: Internal Medicine

## 2019-07-08 ENCOUNTER — Other Ambulatory Visit: Payer: Self-pay

## 2019-07-08 VITALS — BP 128/60 | HR 66 | Temp 97.7°F | Wt 151.3 lb

## 2019-07-08 DIAGNOSIS — I10 Essential (primary) hypertension: Secondary | ICD-10-CM

## 2019-07-08 DIAGNOSIS — E1169 Type 2 diabetes mellitus with other specified complication: Secondary | ICD-10-CM | POA: Diagnosis not present

## 2019-07-08 DIAGNOSIS — E785 Hyperlipidemia, unspecified: Secondary | ICD-10-CM | POA: Diagnosis not present

## 2019-07-08 LAB — POCT GLYCOSYLATED HEMOGLOBIN (HGB A1C): Hemoglobin A1C: 6.9 % — AB (ref 4.0–5.6)

## 2019-07-08 NOTE — Telephone Encounter (Signed)
The patients daughter Nevin Bloodgood call because they were told that her A1C was 6.1 that is good and the AVS has 6.9 A1C is not good. She wants to speak to the doctor.  Please advise

## 2019-07-08 NOTE — Progress Notes (Signed)
Established Patient Office Visit     This visit occurred during the SARS-CoV-2 public health emergency.  Safety protocols were in place, including screening questions prior to the visit, additional usage of staff PPE, and extensive cleaning of exam room while observing appropriate contact time as indicated for disinfecting solutions.    CC/Reason for Visit: 70-monthfollow-up chronic medical conditions  HPI: Stacey Pinnockis a 86y.o. female who is coming in today for the above mentioned reasons. Past Medical History is significant for: Well-controlled hypertension, well-controlled type 2 diabetes, hyperlipidemia.  She is here with her daughter today.  Mainly for A1c check.  She has received both Covid vaccines since I last saw her.  She would like a handicap form filled out.  She would also like me to check her ears, not because she has any symptoms, but because her hearing aids usually cause wax buildup.  She has no acute complaints today.   Past Medical/Surgical History: Past Medical History:  Diagnosis [place]  . [place]   . HOH (hard of hearing)   . Hyperlipidemia   . Hypertension     Past Surgical History:  Procedure Laterality Date  . APPENDECTOMY    . CHOLECYSTECTOMY      Social History:  reports that she has quit smoking. She has never used smokeless tobacco. She reports previous alcohol use. She reports that she does not use drugs.  Allergies: Allergies  Allergen Reactions  . Penicillins Other (See Comments)    Unknown to pt daughter   . Other Hives    Berries     Family History:  Family History  Problem Relation Age of Onset  . CAD Maternal Grandmother   . Pneumonia Maternal Grandfather      Current Outpatient Medications:  .  Accu-Chek FastClix Lancets MISC, 1 each by Does not apply route daily. Dx E11.9, Disp: 100 each, Rfl: 3 .  amitriptyline (ELAVIL) 25 MG tablet, Take 1 tablet (25 mg total) by mouth every other day., Disp: 90 tablet, Rfl:  1 .  amLODipine (NORVASC) 5 MG tablet, Take 1 tablet (5 mg total) by mouth daily., Disp: 90 tablet, Rfl: 1 .  aspirin EC 81 MG tablet, Take 81 mg by mouth daily., Disp: , Rfl:  .  atorvastatin (LIPITOR) 20 MG tablet, Take 1 tablet (20 mg total) by mouth daily., Disp: 90 tablet, Rfl: 1 .  bisoprolol-hydrochlorothiazide (ZIAC) 2.5-6.25 MG tablet, Take 1 tablet by mouth daily., Disp: 90 tablet, Rfl: 1 .  Blood Glucose Monitoring Suppl (ACCU-CHEK GUIDE ME) w/Device KIT, 1 each by Does not apply route daily., Disp: 1 kit, Rfl: 0 .  docusate sodium (COLACE) 100 MG capsule, Take 1 capsule (100 mg total) by mouth 2 (two) times daily., Disp: 60 capsule, Rfl: 11 .  fluticasone (FLONASE) 50 MCG/ACT nasal spray, Place 1 spray into both nostrils 2 (two) times a day., Disp: , Rfl:  .  glucose blood (ACCU-CHEK GUIDE) test strip, 1 each by Other route daily. Dx E11.9, Disp: 100 each, Rfl: 12 .  metFORMIN (GLUCOPHAGE) 1000 MG tablet, Take 1 tablet (1,000 mg total) by mouth 2 (two) times daily with a meal., Disp: 180 tablet, Rfl: 1 .  Multiple Vitamins-Minerals (CENTRUM SILVER 50+WOMEN) TABS, Take 1 tablet by mouth daily., Disp: , Rfl:  .  Omega-3 Fatty Acids (FISH OIL) 1200 MG CAPS, Take 1,000 mg by mouth 2 (two) times a day., Disp: , Rfl:  .  OVER THE COUNTER MEDICATION, Take 1  tablet by mouth daily. **Probiotic**, Disp: , Rfl:  .  Polyethyl Glycol-Propyl Glycol (SYSTANE ULTRA OP), Place 1 drop into both eyes as needed (dry eyes)., Disp: , Rfl:  .  polyethylene glycol powder (GLYCOLAX/MIRALAX) 17 GM/SCOOP powder, Take 17 g by mouth 2 (two) times daily as needed. (Patient taking differently: Take 17 g by mouth 2 (two) times daily as needed for mild constipation. ), Disp: 3350 g, Rfl: 1 .  VITAMIN D, CHOLECALCIFEROL, PO, Take 2,000 Units by mouth daily., Disp: , Rfl:   Review of Systems:  Constitutional: Denies fever, chills, diaphoresis, appetite change and fatigue.  HEENT: Denies photophobia, eye pain, redness,  hearing loss, ear pain, congestion, sore throat, rhinorrhea, sneezing, mouth sores, trouble swallowing, neck pain, neck stiffness and tinnitus.   Respiratory: Denies SOB, DOE, cough, chest tightness,  and wheezing.   Cardiovascular: Denies chest pain, palpitations and leg swelling.  Gastrointestinal: Denies nausea, vomiting, abdominal pain, diarrhea, constipation, blood in stool and abdominal distention.  Genitourinary: Denies dysuria, urgency, frequency, hematuria, flank pain and difficulty urinating.  Endocrine: Denies: hot or cold intolerance, sweats, changes in hair or nails, polyuria, polydipsia. Musculoskeletal: Denies myalgias, back pain, joint swelling, arthralgias and gait problem.  Skin: Denies pallor, rash and wound.  Neurological: Denies dizziness, seizures, syncope, weakness, light-headedness, numbness and headaches.  Hematological: Denies adenopathy. Easy bruising, personal or family bleeding history  Psychiatric/Behavioral: Denies suicidal ideation, mood changes, confusion, nervousness, sleep disturbance and agitation    Physical Exam: Vitals:   07/08/19 1404  BP: 128/60  Pulse: 66  Temp: 97.7 F (36.5 C)  TempSrc: Temporal  SpO2: 98%  Weight: 151 lb 4.8 oz (68.6 kg)    Body mass index is 25.97 kg/m.   Constitutional: NAD, calm, comfortable, very hard of hearing Eyes: PERRL, lids and conjunctivae normal, wears corrective lenses ENMT: Mucous membranes are moist.  Tympanic membrane is pearly white, no erythema or bulging. Neck: normal, supple, no masses, no thyromegaly Respiratory: clear to auscultation bilaterally, no wheezing, no crackles. Normal respiratory effort. No accessory muscle use.  Cardiovascular: Regular rate and rhythm, no murmurs / rubs / gallops.  1+ pitting edema bilaterally Neurologic: Grossly intact and nonfocal Psychiatric: Normal judgment and insight. Alert and oriented x 3. Normal mood.    Impression and Plan:  Type 2 diabetes mellitus with  hyperlipidemia ([place])  -Well-controlled with an A1c of 6.1 today.  Essential hypertension -Well-controlled regimen.    Patient Instructions  -Nice seeing you today!!  -Schedule 3 month follow up.     Lelon Frohlich, MD [facility]

## 2019-07-08 NOTE — Telephone Encounter (Signed)
Pt daughter Nevin Bloodgood returned phone call and would like a call back.

## 2019-07-08 NOTE — Telephone Encounter (Signed)
Attempted to call the daughter, but had a bad connection. Left message on machine for daughter to return our call.

## 2019-07-08 NOTE — Patient Instructions (Signed)
-  Nice seeing you today!!  -Schedule 3 month follow up.

## 2019-07-09 ENCOUNTER — Encounter: Payer: Self-pay | Admitting: Internal Medicine

## 2019-07-29 DIAGNOSIS — Z23 Encounter for immunization: Secondary | ICD-10-CM | POA: Diagnosis not present

## 2019-08-08 ENCOUNTER — Other Ambulatory Visit: Payer: Self-pay | Admitting: Internal Medicine

## 2019-08-08 DIAGNOSIS — I10 Essential (primary) hypertension: Secondary | ICD-10-CM

## 2019-10-05 ENCOUNTER — Ambulatory Visit: Payer: Medicare HMO | Admitting: Internal Medicine

## 2019-10-06 ENCOUNTER — Other Ambulatory Visit: Payer: Self-pay

## 2019-10-07 ENCOUNTER — Encounter: Payer: Self-pay | Admitting: Internal Medicine

## 2019-10-07 ENCOUNTER — Ambulatory Visit (INDEPENDENT_AMBULATORY_CARE_PROVIDER_SITE_OTHER): Payer: Medicare HMO | Admitting: Internal Medicine

## 2019-10-07 VITALS — BP 130/80 | HR 67 | Temp 97.9°F | Ht 64.0 in | Wt 153.7 lb

## 2019-10-07 DIAGNOSIS — E785 Hyperlipidemia, unspecified: Secondary | ICD-10-CM

## 2019-10-07 DIAGNOSIS — I10 Essential (primary) hypertension: Secondary | ICD-10-CM

## 2019-10-07 DIAGNOSIS — E1169 Type 2 diabetes mellitus with other specified complication: Secondary | ICD-10-CM | POA: Diagnosis not present

## 2019-10-07 LAB — POCT GLYCOSYLATED HEMOGLOBIN (HGB A1C): Hemoglobin A1C: 6.5 % — AB (ref 4.0–5.6)

## 2019-10-07 MED ORDER — ATORVASTATIN CALCIUM 20 MG PO TABS: 20.0000 mg | ORAL_TABLET | Freq: Every day | ORAL | 1 refills | Status: DC

## 2019-10-07 MED ORDER — AMLODIPINE BESYLATE 5 MG PO TABS: 5.0000 mg | ORAL_TABLET | Freq: Every day | ORAL | 1 refills | Status: DC

## 2019-10-07 MED ORDER — BISOPROLOL-HYDROCHLOROTHIAZIDE 2.5-6.25 MG PO TABS: 1.0000 | ORAL_TABLET | Freq: Every day | ORAL | 1 refills | Status: DC

## 2019-10-07 NOTE — Patient Instructions (Signed)
-  Nice seeing you today!!  -Schedule follow up in 3 months for your physical. Please come in fasting that day. 

## 2019-10-07 NOTE — Progress Notes (Signed)
Established Patient Office Visit     This visit occurred during the SARS-CoV-2 public health emergency.  Safety protocols were in place, including screening questions prior to the visit, additional usage of staff PPE, and extensive cleaning of exam room while observing appropriate contact time as indicated for disinfecting solutions.    CC/Reason for Visit: 72-monthfollow-up chronic medical conditions  HPI: Stacey Armstrong a 86y.o. female who is coming in today for the above mentioned reasons. Past Medical History is significant for: Well-controlled hypertension, diabetes and hyperlipidemia.  She has been doing well since I last saw her and has no new complaints.  Her bowel movements have not been regular and she is everyday.   Past Medical/Surgical History: Past Medical History:  Diagnosis Date  . [place]   . HOH (hard of hearing)   . Hyperlipidemia   . Hypertension     Past Surgical History:  Procedure Laterality Date  . APPENDECTOMY    . CHOLECYSTECTOMY      Social History:  reports that she has quit smoking. She has never used smokeless tobacco. She reports previous alcohol use. She reports that she does not use drugs.  Allergies: Allergies  Allergen Reactions  . Penicillins Other (See Comments)    Unknown to pt daughter   . Other Hives    Berries     Family History:  Family History  Problem Relation Age of Onset  . CAD Maternal Grandmother   . Pneumonia Maternal Grandfather      Current Outpatient Medications:  .  Accu-Chek FastClix Lancets MISC, 1 each by Does not apply route daily. Dx E11.9, Disp: 100 each, Rfl: 3 .  amitriptyline (ELAVIL) 25 MG tablet, Take 1 tablet (25 mg total) by mouth every other day., Disp: 90 tablet, Rfl: 1 .  amLODipine (NORVASC) 5 MG tablet, Take 1 tablet (5 mg total) by mouth daily., Disp: 90 tablet, Rfl: 1 .  aspirin EC 81 MG tablet, Take 81 mg by mouth daily., Disp: , Rfl:  .  atorvastatin (LIPITOR) 20 MG  tablet, Take 1 tablet (20 mg total) by mouth daily., Disp: 90 tablet, Rfl: 1 .  bisoprolol-hydrochlorothiazide (ZIAC) 2.5-6.25 MG tablet, Take 1 tablet by mouth daily., Disp: 90 tablet, Rfl: 1 .  Blood Glucose Monitoring Suppl (ACCU-CHEK GUIDE ME) w/Device KIT, 1 each by Does not apply route daily., Disp: 1 kit, Rfl: 0 .  docusate sodium (COLACE) 100 MG capsule, Take 1 capsule (100 mg total) by mouth 2 (two) times daily., Disp: 60 capsule, Rfl: 11 .  fluticasone (FLONASE) 50 MCG/ACT nasal spray, Place 1 spray into both nostrils 2 (two) times a day., Disp: , Rfl:  .  glucose blood (ACCU-CHEK GUIDE) test strip, 1 each by Other route daily. Dx E11.9, Disp: 100 each, Rfl: 12 .  metFORMIN (GLUCOPHAGE) 1000 MG tablet, Take 1 tablet (1,000 mg total) by mouth 2 (two) times daily with a meal., Disp: 180 tablet, Rfl: 1 .  Multiple Vitamins-Minerals (CENTRUM SILVER 50+WOMEN) TABS, Take 1 tablet by mouth daily., Disp: , Rfl:  .  Omega-3 Fatty Acids (FISH OIL) 1200 MG CAPS, Take 1,000 mg by mouth 2 (two) times a day., Disp: , Rfl:  .  OVER THE COUNTER MEDICATION, Take 1 tablet by mouth daily. **Probiotic**, Disp: , Rfl:  .  Polyethyl Glycol-Propyl Glycol (SYSTANE ULTRA OP), Place 1 drop into both eyes as needed (dry eyes)., Disp: , Rfl:  .  polyethylene glycol powder (GLYCOLAX/MIRALAX) 17 GM/SCOOP powder,  Take 17 g by mouth 2 (two) times daily as needed. (Patient taking differently: Take 17 g by mouth 2 (two) times daily as needed for mild constipation. ), Disp: 3350 g, Rfl: 1 .  VITAMIN D, CHOLECALCIFEROL, PO, Take 2,000 Units by mouth daily., Disp: , Rfl:   Review of Systems:  Constitutional: Denies fever, chills, diaphoresis, appetite change and fatigue.  HEENT: Denies photophobia, eye pain, redness, hearing loss, ear pain, congestion, sore throat, rhinorrhea, sneezing, mouth sores, trouble swallowing, neck pain, neck stiffness and tinnitus.   Respiratory: Denies SOB, DOE, cough, chest tightness,  and  wheezing.   Cardiovascular: Denies chest pain, palpitations and leg swelling.  Gastrointestinal: Denies nausea, vomiting, abdominal pain, diarrhea, constipation, blood in stool and abdominal distention.  Genitourinary: Denies dysuria, urgency, frequency, hematuria, flank pain and difficulty urinating.  Endocrine: Denies: hot or cold intolerance, sweats, changes in hair or nails, polyuria, polydipsia. Musculoskeletal: Denies myalgias, back pain, joint swelling, arthralgias and gait problem.  Skin: Denies pallor, rash and wound.  Neurological: Denies dizziness, seizures, syncope, weakness, light-headedness, numbness and headaches.  Hematological: Denies adenopathy. Easy bruising, personal or family bleeding history  Psychiatric/Behavioral: Denies suicidal ideation, mood changes, confusion, nervousness, sleep disturbance and agitation    Physical Exam: Vitals:   10/07/19 1404  BP: 130/80  Pulse: 67  Temp: 97.9 F (36.6 C)  TempSrc: Temporal  SpO2: 97%  Weight: 153 lb 11.2 oz (69.7 kg)  Height: '5\' 4"'  (1.626 m)    Body mass index is 26.38 kg/m.   Constitutional: NAD, calm, comfortable, ambulates with a walker Eyes: PERRL, lids and conjunctivae normal, wears corrective lenses ENMT: Mucous membranes are moist. Respiratory: clear to auscultation bilaterally, no wheezing, no crackles. Normal respiratory effort. No accessory muscle use.  Cardiovascular: Regular rate and rhythm, no murmurs / rubs / gallops.  2+ pitting extremity edema bilaterally. Neurologic: Grossly intact and nonfocal Psychiatric: Normal judgment and insight. Alert and oriented x 3. Normal mood.    Impression and Plan:  Type 2 diabetes mellitus with hyperlipidemia ([facility])  -A1c 6.5 today, well-controlled, continue current regimen.  Essential hypertension  -Well-controlled, continue current regimen.  Hyperlipidemia associated with type 2 diabetes mellitus ([place])  -Plan: atorvastatin (LIPITOR) 20 MG tablet -Check  lipids when she returns for CPE.    Patient Instructions  -Nice seeing you today!!  -Schedule follow up in 3 months for your physical. Please come in fasting that day.     Lelon Frohlich, MD [facility] at [facility]

## 2019-10-19 ENCOUNTER — Other Ambulatory Visit: Payer: Self-pay | Admitting: *Deleted

## 2019-10-19 MED ORDER — METFORMIN HCL 1000 MG PO TABS: 1000.0000 mg | ORAL_TABLET | Freq: Two times a day (BID) | ORAL | 1 refills | Status: DC

## 2020-01-04 ENCOUNTER — Other Ambulatory Visit: Payer: Self-pay | Admitting: *Deleted

## 2020-01-04 MED ORDER — BD SWAB SINGLE USE REGULAR PADS: 1.0000 | MEDICATED_PAD | Freq: Every day | 0 refills | Status: DC

## 2020-01-04 MED ORDER — TRUE METRIX METER W/DEVICE KIT: 1.0000 | PACK | Freq: Every day | 1 refills | Status: DC

## 2020-01-04 MED ORDER — TRUEPLUS LANCETS 33G MISC: 1.0000 | Freq: Every day | 3 refills | Status: DC

## 2020-01-11 ENCOUNTER — Other Ambulatory Visit: Payer: Self-pay

## 2020-01-11 ENCOUNTER — Ambulatory Visit (INDEPENDENT_AMBULATORY_CARE_PROVIDER_SITE_OTHER): Payer: Medicare HMO | Admitting: Internal Medicine

## 2020-01-11 ENCOUNTER — Encounter: Payer: Self-pay | Admitting: Internal Medicine

## 2020-01-11 VITALS — BP 120/80 | HR 72 | Temp 98.4°F | Ht 63.0 in | Wt 149.0 lb

## 2020-01-11 DIAGNOSIS — Z0000 Encounter for general adult medical examination without abnormal findings: Secondary | ICD-10-CM | POA: Diagnosis not present

## 2020-01-11 DIAGNOSIS — I10 Essential (primary) hypertension: Secondary | ICD-10-CM | POA: Diagnosis not present

## 2020-01-11 DIAGNOSIS — E1169 Type 2 diabetes mellitus with other specified complication: Secondary | ICD-10-CM | POA: Diagnosis not present

## 2020-01-11 DIAGNOSIS — E785 Hyperlipidemia, unspecified: Secondary | ICD-10-CM

## 2020-01-11 DIAGNOSIS — E1140 Type 2 diabetes mellitus with diabetic neuropathy, unspecified: Secondary | ICD-10-CM

## 2020-01-11 DIAGNOSIS — Z23 Encounter for immunization: Secondary | ICD-10-CM | POA: Diagnosis not present

## 2020-01-11 DIAGNOSIS — E559 Vitamin D deficiency, unspecified: Secondary | ICD-10-CM | POA: Diagnosis not present

## 2020-01-11 NOTE — Progress Notes (Addendum)
Established Patient Office Visit     This visit occurred during the SARS-CoV-2 public health emergency.  Safety protocols were in place, including screening questions prior to the visit, additional usage of staff PPE, and extensive cleaning of exam room while observing appropriate contact time as indicated for disinfecting solutions.    CC/Reason for Visit: Annual preventive exam and subsequent Medicare wellness visit  HPI: Stacey Armstrong is a 86 y.o. female who is coming in today for the above mentioned reasons. Past Medical History is significant for:  Well-controlled hypertension, diabetes and hyperlipidemia.  She has been complaining of some tingling in her feet.  She has been seeing an ophthalmologist for left cataract that they are hesitant to operate on given her age.  She has significant hearing issues despite her hearing aids.  She has routine eye and dental care.  She has had both of her Covid vaccines.  She is due for Pneumovax and Tdap.  She elects to defer further cancer screening due to age.   Past Medical/Surgical History: Past Medical History:  Diagnosis Date  . Diabetes ([place])   . HOH (hard of hearing)   . Hyperlipidemia   . Hypertension     Past Surgical History:  Procedure Laterality Date  . APPENDECTOMY    . CHOLECYSTECTOMY      Social History:  reports that she has quit smoking. She has never used smokeless tobacco. She reports previous alcohol use. She reports that she does not use drugs.  Allergies: Allergies  Allergen Reactions  . Penicillins Other (See Comments)    Unknown to pt daughter   . Other Hives    Berries     Family History:  Family History  Problem Relation Age of Onset  . CAD Maternal Grandmother   . Pneumonia Maternal Grandfather      Current Outpatient Medications:  .  Alcohol Swabs (B-D SINGLE USE SWABS REGULAR) PADS, 1 each by Other route daily., Disp: 100 each, Rfl: 0 .  amitriptyline (ELAVIL) 25 MG tablet, Take 1 tablet  (25 mg total) by mouth every other day., Disp: 90 tablet, Rfl: 1 .  amLODipine (NORVASC) 5 MG tablet, Take 1 tablet (5 mg total) by mouth daily., Disp: 90 tablet, Rfl: 1 .  aspirin EC 81 MG tablet, Take 81 mg by mouth daily., Disp: , Rfl:  .  atorvastatin (LIPITOR) 20 MG tablet, Take 1 tablet (20 mg total) by mouth daily., Disp: 90 tablet, Rfl: 1 .  bisoprolol-hydrochlorothiazide (ZIAC) 2.5-6.25 MG tablet, Take 1 tablet by mouth daily., Disp: 90 tablet, Rfl: 1 .  Blood Glucose Monitoring Suppl (TRUE METRIX METER) w/Device KIT, 1 each by Does not apply route daily., Disp: 1 kit, Rfl: 1 .  docusate sodium (COLACE) 100 MG capsule, Take 1 capsule (100 mg total) by mouth 2 (two) times daily., Disp: 60 capsule, Rfl: 11 .  fluticasone (FLONASE) 50 MCG/ACT nasal spray, Place 1 spray into both nostrils 2 (two) times a day., Disp: , Rfl:  .  metFORMIN (GLUCOPHAGE) 1000 MG tablet, Take 1 tablet (1,000 mg total) by mouth 2 (two) times daily with a meal., Disp: 180 tablet, Rfl: 1 .  Multiple Vitamins-Minerals (CENTRUM SILVER 50+WOMEN) TABS, Take 1 tablet by mouth daily., Disp: , Rfl:  .  Omega-3 Fatty Acids (FISH OIL) 1200 MG CAPS, Take 1,000 mg by mouth 2 (two) times a day., Disp: , Rfl:  .  OVER THE COUNTER MEDICATION, Take 1 tablet by mouth daily. **Probiotic**, Disp: , Rfl:  .  Polyethyl Glycol-Propyl Glycol (SYSTANE ULTRA OP), Place 1 drop into both eyes as needed (dry eyes)., Disp: , Rfl:  .  polyethylene glycol powder (GLYCOLAX/MIRALAX) 17 GM/SCOOP powder, Take 17 g by mouth 2 (two) times daily as needed. (Patient taking differently: Take 17 g by mouth 2 (two) times daily as needed for mild constipation. ), Disp: 3350 g, Rfl: 1 .  TRUEplus Lancets 33G MISC, 1 each by Does not apply route daily., Disp: 100 each, Rfl: 3 .  VITAMIN D, CHOLECALCIFEROL, PO, Take 2,000 Units by mouth daily., Disp: , Rfl:   Review of Systems:  Constitutional: Denies fever, chills, diaphoresis, appetite change and fatigue.   HEENT: Denies photophobia, eye pain, redness, hearing loss, ear pain, congestion, sore throat, rhinorrhea, sneezing, mouth sores, trouble swallowing, neck pain, neck stiffness and tinnitus.   Respiratory: Denies SOB, DOE, cough, chest tightness,  and wheezing.   Cardiovascular: Denies chest pain, palpitations and leg swelling.  Gastrointestinal: Denies nausea, vomiting, abdominal pain, diarrhea, constipation, blood in stool and abdominal distention.  Genitourinary: Denies dysuria, urgency, frequency, hematuria, flank pain and difficulty urinating.  Endocrine: Denies: hot or cold intolerance, sweats, changes in hair or nails, polyuria, polydipsia. Musculoskeletal: Denies myalgias, back pain, joint swelling, arthralgias and gait problem.  Skin: Denies pallor, rash and wound.  Neurological: Denies dizziness, seizures, syncope, weakness, light-headedness and headaches.  Hematological: Denies adenopathy. Easy bruising, personal or family bleeding history  Psychiatric/Behavioral: Denies suicidal ideation, mood changes, confusion, nervousness, sleep disturbance and agitation    Physical Exam: Vitals:   01/11/20 0948  BP: 120/80  Pulse: 72  Temp: 98.4 F (36.9 C)  TempSrc: Oral  SpO2: 98%  Weight: 149 lb (67.6 kg)  Height: $Remove'5\' 3"'AwcHIwg$  (1.6 m)    Body mass index is 26.39 kg/m.   Constitutional: NAD, calm, comfortable Eyes: PERRL, lids and conjunctivae normal, wears corrective lenses ENMT: Mucous membranes are moist.  Tympanic membrane is pearly white, no erythema or bulging. Neck: normal, supple, no masses, no thyromegaly Respiratory: clear to auscultation bilaterally, no wheezing, no crackles. Normal respiratory effort. No accessory muscle use.  Cardiovascular: Regular rate and rhythm, no murmurs / rubs / gallops. No extremity edema. 2+ pedal pulses.  Abdomen: no tenderness, no masses palpated. No hepatosplenomegaly. Bowel sounds positive.  Musculoskeletal: no clubbing / cyanosis. No joint  deformity upper and lower extremities. Good ROM, no contractures. Normal muscle tone.  Skin: no rashes, lesions, ulcers. No induration Neurologic: CN 2-12 grossly intact. Sensation intact, DTR normal. Strength 5/5 in all 4.  Psychiatric: Normal judgment and insight. Alert and oriented x 3. Normal mood.    Subsequent Medicare wellness visit   1. Risk factors, based on past  M,S,F -cardiovascular disease risk factors include age, history of diabetes, history of hypertension, history of hyperlipidemia   2.  Physical activities: Very sedentary   3.  Depression/mood:  Stable, not depressed   4.  Hearing:  Significant issues despite hearing aids  5.  ADL's: Independent in most ADLs, she lives at a senior living facility   6.  Fall risk:  Low fall risk   7.  Home safety: No problems identified   8.  Height weight, and visual acuity: Height and weight as above, visual acuity is 20/40 on the right, 20/50 on the left and 20/40 with eyes together   9.  Counseling:  Advised Tdap update at pharmacy   10. Lab orders based on risk factors: Laboratory update will be reviewed   11. Referral :  None today  12. Care plan:  Follow-up with me in 3 to 4 months   13. Cognitive assessment:  No cognitive impairment   14. Screening: Patient provided with a written and personalized 5-10 year screening schedule in the AVS.   yes   15. Provider List Update:   PCP, ophthalmologist  16. Advance Directives: Full code     Office Visit from 01/11/2020 in [facility]  PHQ-9 Total Score 4      Fall Risk  01/11/2020 12/30/2018  Falls in the past year? 0 0  Number falls in past yr: 0 0  Injury with Fall? 0 0     Impression and Plan:  Encounter for preventive health examination -She has routine eye and dental care. -Due for Pneumovax and Tdap, she will get Pneumovax in office and Tdap at pharmacy. -Screening labs today. -Healthy lifestyle discussed in detail. -She has elected  to defer further cancer screening due to age, I agree.  Type 2 diabetes mellitus with diabetic neuropathy, without long-term current use of insulin (HCC)  Type 2 diabetes mellitus with hyperlipidemia (HCC) --Recheck A1c today was last 6.5 in May, check lipid panel. -Although she does have diabetic neuropathy, it is not significant enough to start treatment, observe for now.  Essential hypertension  -Well-controlled on current regimen.   Patient Instructions  -Nice seeing you today!!  -Lab work today; will notify you once results are available.  -Pneumonia vaccine today.  -Tetanus booster at your pharmacy.  -See you back in 3-4 months.   Preventive Care 9 Years and Older, Female Preventive care refers to lifestyle choices and visits with your health care provider that can promote health and wellness. This includes:  A yearly physical exam. This is also called an annual well check.  Regular dental and eye exams.  Immunizations.  Screening for certain conditions.  Healthy lifestyle choices, such as diet and exercise. What can I expect for my preventive care visit? Physical exam Your health care provider will check:  Height and weight. These may be used to calculate body mass index (BMI), which is a measurement that tells if you are at a healthy weight.  Heart rate and blood pressure.  Your skin for abnormal spots. Counseling Your health care provider may ask you questions about:  Alcohol, tobacco, and drug use.  Emotional well-being.  Home and relationship well-being.  Sexual activity.  Eating habits.  History of falls.  Memory and ability to understand (cognition).  Work and work Statistician.  Pregnancy and menstrual history. What immunizations do I need?  Influenza (flu) vaccine  This is recommended every year. Tetanus, diphtheria, and pertussis (Tdap) vaccine  You may need a Td booster every 10 years. Varicella (chickenpox) vaccine  You may  need this vaccine if you have not already been vaccinated. Zoster (shingles) vaccine  You may need this after age 11. Pneumococcal conjugate (PCV13) vaccine  One dose is recommended after age 69. Pneumococcal polysaccharide (PPSV23) vaccine  One dose is recommended after age 82. Measles, mumps, and rubella (MMR) vaccine  You may need at least one dose of MMR if you were born in 1957 or later. You may also need a second dose. Meningococcal conjugate (MenACWY) vaccine  You may need this if you have certain conditions. Hepatitis A vaccine  You may need this if you have certain conditions or if you travel or work in places where you may be exposed to hepatitis A. Hepatitis B vaccine  You may need this if you  have certain conditions or if you travel or work in places where you may be exposed to hepatitis B. Haemophilus influenzae type b (Hib) vaccine  You may need this if you have certain conditions. You may receive vaccines as individual doses or as more than one vaccine together in one shot (combination vaccines). Talk with your health care provider about the risks and benefits of combination vaccines. What tests do I need? Blood tests  Lipid and cholesterol levels. These may be checked every 5 years, or more frequently depending on your overall health.  Hepatitis C test.  Hepatitis B test. Screening  Lung cancer screening. You may have this screening every year starting at age 58 if you have a 30-pack-year history of smoking and currently smoke or have quit within the past 15 years.  Colorectal cancer screening. All adults should have this screening starting at age 54 and continuing until age 73. Your health care provider may recommend screening at age 56 if you are at increased risk. You will have tests every 1-10 years, depending on your results and the type of screening test.  Diabetes screening. This is done by checking your blood sugar (glucose) after you have not eaten for  a while (fasting). You may have this done every 1-3 years.  Mammogram. This may be done every 1-2 years. Talk with your health care provider about how often you should have regular mammograms.  BRCA-related cancer screening. This may be done if you have a family history of breast, ovarian, tubal, or peritoneal cancers. Other tests  Sexually transmitted disease (STD) testing.  Bone density scan. This is done to screen for osteoporosis. You may have this done starting at age 84. Follow these instructions at home: Eating and drinking  Eat a diet that includes fresh fruits and vegetables, whole grains, lean protein, and low-fat dairy products. Limit your intake of foods with high amounts of sugar, saturated fats, and salt.  Take vitamin and mineral supplements as recommended by your health care provider.  Do not drink alcohol if your health care provider tells you not to drink.  If you drink alcohol: ? Limit how much you have to 0-1 drink a day. ? Be aware of how much alcohol is in your drink. In the [place] one drink equals one 12 oz bottle of beer (355 mL), one 5 oz glass of wine (148 mL), or one 1 oz glass of hard liquor (44 mL). Lifestyle  Take daily care of your teeth and gums.  Stay active. Exercise for at least 30 minutes on 5 or more days each week.  Do not use any products that contain nicotine or tobacco, such as cigarettes, e-cigarettes, and chewing tobacco. If you need help quitting, ask your health care provider.  If you are sexually active, practice safe sex. Use a condom or other form of protection in order to prevent STIs (sexually transmitted infections).  Talk with your health care provider about taking a low-dose aspirin or statin. What's next?  Go to your health care provider once a year for a well check visit.  Ask your health care provider how often you should have your eyes and teeth checked.  Stay up to date on all vaccines. This information is not intended  to replace advice given to you by your health care provider. Make sure you discuss any questions you have with your health care provider. Document Revised: 05/06/2018 Document Reviewed: 05/06/2018 Elsevier Patient Education  2020 [employer].  Keimora Swartout Hernandez Acosta, MD [facility]   

## 2020-01-11 NOTE — Addendum Note (Signed)
Addended by: Westley Hummer B on: 01/11/2020 11:26 AM   Modules accepted: Orders

## 2020-01-11 NOTE — Patient Instructions (Signed)
-Nice seeing you today!!  -Lab work today; will notify you once results are available.  -Pneumonia vaccine today.  -Tetanus booster at your pharmacy.  -See you back in 3-4 months.   Preventive Care 84 Years and Older, Female Preventive care refers to lifestyle choices and visits with your health care provider that can promote health and wellness. This includes:  A yearly physical exam. This is also called an annual well check.  Regular dental and eye exams.  Immunizations.  Screening for certain conditions.  Healthy lifestyle choices, such as diet and exercise. What can I expect for my preventive care visit? Physical exam Your health care provider will check:  Height and weight. These may be used to calculate body mass index (BMI), which is a measurement that tells if you are at a healthy weight.  Heart rate and blood pressure.  Your skin for abnormal spots. Counseling Your health care provider may ask you questions about:  Alcohol, tobacco, and drug use.  Emotional well-being.  Home and relationship well-being.  Sexual activity.  Eating habits.  History of falls.  Memory and ability to understand (cognition).  Work and work Statistician.  Pregnancy and menstrual history. What immunizations do I need?  Influenza (flu) vaccine  This is recommended every year. Tetanus, diphtheria, and pertussis (Tdap) vaccine  You may need a Td booster every 10 years. Varicella (chickenpox) vaccine  You may need this vaccine if you have not already been vaccinated. Zoster (shingles) vaccine  You may need this after age 84. Pneumococcal conjugate (PCV13) vaccine  One dose is recommended after age 84. Pneumococcal polysaccharide (PPSV23) vaccine  One dose is recommended after age 84. Measles, mumps, and rubella (MMR) vaccine  You may need at least one dose of MMR if you were born in 1957 or later. You may also need a second dose. Meningococcal conjugate (MenACWY)  vaccine  You may need this if you have certain conditions. Hepatitis A vaccine  You may need this if you have certain conditions or if you travel or work in places where you may be exposed to hepatitis A. Hepatitis B vaccine  You may need this if you have certain conditions or if you travel or work in places where you may be exposed to hepatitis B. Haemophilus influenzae type b (Hib) vaccine  You may need this if you have certain conditions. You may receive vaccines as individual doses or as more than one vaccine together in one shot (combination vaccines). Talk with your health care provider about the risks and benefits of combination vaccines. What tests do I need? Blood tests  Lipid and cholesterol levels. These may be checked every 5 years, or more frequently depending on your overall health.  Hepatitis C test.  Hepatitis B test. Screening  Lung cancer screening. You may have this screening every year starting at age 84 if you have a 30-pack-year history of smoking and currently smoke or have quit within the past 15 years.  Colorectal cancer screening. All adults should have this screening starting at age 84 and continuing until age 84. Your health care provider may recommend screening at age 84 if you are at increased risk. You will have tests every 1-10 years, depending on your results and the type of screening test.  Diabetes screening. This is done by checking your blood sugar (glucose) after you have not eaten for a while (fasting). You may have this done every 1-3 years.  Mammogram. This may be done every 1-2 years. Talk  with your health care provider about how often you should have regular mammograms.  BRCA-related cancer screening. This may be done if you have a family history of breast, ovarian, tubal, or peritoneal cancers. Other tests  Sexually transmitted disease (STD) testing.  Bone density scan. This is done to screen for osteoporosis. You may have this done  starting at age 84. Follow these instructions at home: Eating and drinking  Eat a diet that includes fresh fruits and vegetables, whole grains, lean protein, and low-fat dairy products. Limit your intake of foods with high amounts of sugar, saturated fats, and salt.  Take vitamin and mineral supplements as recommended by your health care provider.  Do not drink alcohol if your health care provider tells you not to drink.  If you drink alcohol: ? Limit how much you have to 0-1 drink a day. ? Be aware of how much alcohol is in your drink. In the [place] one drink equals one 12 oz bottle of beer (355 mL), one 5 oz glass of wine (148 mL), or one 1 oz glass of hard liquor (44 mL). Lifestyle  Take daily care of your teeth and gums.  Stay active. Exercise for at least 30 minutes on 5 or more days each week.  Do not use any products that contain nicotine or tobacco, such as cigarettes, e-cigarettes, and chewing tobacco. If you need help quitting, ask your health care provider.  If you are sexually active, practice safe sex. Use a condom or other form of protection in order to prevent STIs (sexually transmitted infections).  Talk with your health care provider about taking a low-dose aspirin or statin. What's next?  Go to your health care provider once a year for a well check visit.  Ask your health care provider how often you should have your eyes and teeth checked.  Stay up to date on all vaccines. This information is not intended to replace advice given to you by your health care provider. Make sure you discuss any questions you have with your health care provider. Document Revised: 05/06/2018 Document Reviewed: 05/06/2018 Elsevier Patient Education  2020 [employer].

## 2020-01-12 LAB — CBC WITH DIFFERENTIAL/PLATELET
Absolute Monocytes: 800 cells/uL (ref 200–950)
Basophils Absolute: 38 cells/uL (ref 0–200)
Basophils Relative: 0.6 %
Eosinophils Absolute: 109 cells/uL (ref 15–500)
Eosinophils Relative: 1.7 %
HCT: 38.1 % (ref 35.0–45.0)
Hemoglobin: 12.4 g/dL (ref 11.7–15.5)
Lymphs Abs: 1389 cells/uL (ref 850–3900)
MCH: 29 pg (ref 27.0–33.0)
MCHC: 32.5 g/dL (ref 32.0–36.0)
MCV: 89.2 fL (ref 80.0–100.0)
MPV: 9.7 fL (ref 7.5–12.5)
Monocytes Relative: 12.5 %
Neutro Abs: 4064 cells/uL (ref 1500–7800)
Neutrophils Relative %: 63.5 %
Platelets: 279 10*3/uL (ref 140–400)
RBC: 4.27 10*6/uL (ref 3.80–5.10)
RDW: 13.3 % (ref 11.0–15.0)
Total Lymphocyte: 21.7 %
WBC: 6.4 10*3/uL (ref 3.8–10.8)

## 2020-01-12 LAB — LIPID PANEL
Cholesterol: 132 mg/dL (ref <200)
HDL: 54 mg/dL (ref >=50)
LDL Cholesterol (Calc): 55 mg/dL (calc)
Non-HDL Cholesterol (Calc): 78 mg/dL (calc) (ref <130)
Total CHOL/HDL Ratio: 2.4 (calc) (ref <5.0)
Triglycerides: 151 mg/dL — ABNORMAL HIGH (ref <150)

## 2020-01-12 LAB — TSH: TSH: 2.41 mIU/L (ref 0.40–4.50)

## 2020-01-12 LAB — VITAMIN D 25 HYDROXY (VIT D DEFICIENCY, FRACTURES): Vit D, 25-Hydroxy: 50 ng/mL (ref 30–100)

## 2020-01-12 LAB — COMPREHENSIVE METABOLIC PANEL
AG Ratio: 1.6 (calc) (ref 1.0–2.5)
ALT: 12 U/L (ref 6–29)
AST: 15 U/L (ref 10–35)
Albumin: 3.9 g/dL (ref 3.6–5.1)
Alkaline phosphatase (APISO): 73 U/L (ref 37–153)
BUN/Creatinine Ratio: 21 (calc) (ref 6–22)
BUN: 21 mg/dL (ref 7–25)
CO2: 30 mmol/L (ref 20–32)
Calcium: 9.9 mg/dL (ref 8.6–10.4)
Chloride: 99 mmol/L (ref 98–110)
Creat: 1.01 mg/dL — ABNORMAL HIGH (ref 0.60–0.88)
Globulin: 2.5 g/dL (calc) (ref 1.9–3.7)
Glucose, Bld: 136 mg/dL — ABNORMAL HIGH (ref 65–99)
Potassium: 3.9 mmol/L (ref 3.5–5.3)
Sodium: 139 mmol/L (ref 135–146)
Total Bilirubin: 0.7 mg/dL (ref 0.2–1.2)
Total Protein: 6.4 g/dL (ref 6.1–8.1)

## 2020-01-12 LAB — HEMOGLOBIN A1C
Hgb A1c MFr Bld: 6.3 % of total Hgb — ABNORMAL HIGH (ref <5.7)
Mean Plasma Glucose: 134 (calc)
eAG (mmol/L): 7.4 (calc)

## 2020-01-12 LAB — VITAMIN B12: Vitamin B-12: 273 pg/mL (ref 200–1100)

## 2020-02-02 ENCOUNTER — Other Ambulatory Visit: Payer: Self-pay | Admitting: Internal Medicine

## 2020-02-02 DIAGNOSIS — E1140 Type 2 diabetes mellitus with diabetic neuropathy, unspecified: Secondary | ICD-10-CM

## 2020-02-02 DIAGNOSIS — I10 Essential (primary) hypertension: Secondary | ICD-10-CM

## 2020-02-22 ENCOUNTER — Telehealth: Payer: Self-pay | Admitting: Internal Medicine

## 2020-02-22 ENCOUNTER — Other Ambulatory Visit: Payer: Self-pay

## 2020-02-22 ENCOUNTER — Other Ambulatory Visit: Payer: Medicare HMO

## 2020-02-22 DIAGNOSIS — E1140 Type 2 diabetes mellitus with diabetic neuropathy, unspecified: Secondary | ICD-10-CM | POA: Diagnosis not present

## 2020-02-22 DIAGNOSIS — I10 Essential (primary) hypertension: Secondary | ICD-10-CM | POA: Diagnosis not present

## 2020-02-22 NOTE — Telephone Encounter (Signed)
Pt and her daughter stopped by the front and asked if they could get COVID booster,tetanus and flu shot or should they do them separate or which combo would be good for the pt?

## 2020-02-22 NOTE — Telephone Encounter (Signed)
Stacey Armstrong that it is okay to have vaccines requested on the same day.

## 2020-02-23 LAB — BASIC METABOLIC PANEL
BUN/Creatinine Ratio: 20 (calc) (ref 6–22)
BUN: 19 mg/dL (ref 7–25)
CO2: 34 mmol/L — ABNORMAL HIGH (ref 20–32)
Calcium: 9.7 mg/dL (ref 8.6–10.4)
Chloride: 100 mmol/L (ref 98–110)
Creat: 0.96 mg/dL — ABNORMAL HIGH (ref 0.60–0.88)
Glucose, Bld: 134 mg/dL — ABNORMAL HIGH (ref 65–99)
Potassium: 4.1 mmol/L (ref 3.5–5.3)
Sodium: 140 mmol/L (ref 135–146)

## 2020-03-15 ENCOUNTER — Other Ambulatory Visit: Payer: Self-pay | Admitting: Internal Medicine

## 2020-03-15 DIAGNOSIS — E1169 Type 2 diabetes mellitus with other specified complication: Secondary | ICD-10-CM

## 2020-03-15 DIAGNOSIS — I10 Essential (primary) hypertension: Secondary | ICD-10-CM

## 2020-03-17 ENCOUNTER — Other Ambulatory Visit: Payer: Self-pay | Admitting: Internal Medicine

## 2020-04-10 ENCOUNTER — Ambulatory Visit (INDEPENDENT_AMBULATORY_CARE_PROVIDER_SITE_OTHER): Payer: Medicare HMO | Admitting: Internal Medicine

## 2020-04-10 ENCOUNTER — Encounter: Payer: Self-pay | Admitting: Internal Medicine

## 2020-04-10 ENCOUNTER — Other Ambulatory Visit: Payer: Self-pay

## 2020-04-10 VITALS — BP 120/70 | HR 63 | Temp 98.2°F | Wt 153.6 lb

## 2020-04-10 DIAGNOSIS — E1140 Type 2 diabetes mellitus with diabetic neuropathy, unspecified: Secondary | ICD-10-CM

## 2020-04-10 DIAGNOSIS — I10 Essential (primary) hypertension: Secondary | ICD-10-CM

## 2020-04-10 DIAGNOSIS — Z23 Encounter for immunization: Secondary | ICD-10-CM | POA: Diagnosis not present

## 2020-04-10 DIAGNOSIS — E1169 Type 2 diabetes mellitus with other specified complication: Secondary | ICD-10-CM | POA: Diagnosis not present

## 2020-04-10 DIAGNOSIS — E785 Hyperlipidemia, unspecified: Secondary | ICD-10-CM | POA: Diagnosis not present

## 2020-04-10 LAB — POCT GLYCOSYLATED HEMOGLOBIN (HGB A1C): Hemoglobin A1C: 6.3 % — AB (ref 4.0–5.6)

## 2020-04-10 MED ORDER — ACCU-CHEK FASTCLIX LANCETS MISC: 3 refills | Status: DC

## 2020-04-10 NOTE — Patient Instructions (Signed)
-  Nice seeing you today!!  -Flu vaccine today.  -Schedule follow up in 3 months.

## 2020-04-10 NOTE — Progress Notes (Signed)
Established Patient Office Visit     This visit occurred during the SARS-CoV-2 public health emergency.  Safety protocols were in place, including screening questions prior to the visit, additional usage of staff PPE, and extensive cleaning of exam room while observing appropriate contact time as indicated for disinfecting solutions.    CC/Reason for Visit: 102-monthfollow-up chronic medical conditions  HPI: LMyrah Strawdermanis a 86y.o. female who is coming in today for the above mentioned reasons. Past Medical History is significant for: Hypertension, hyperlipidemia, type 2 diabetes. She has been doing well since we last spoke. She is requesting her flu vaccine today. She is compliant with medications.   Past Medical/Surgical History: Past Medical History:  Diagnosis Date  . [place]   . HOH (hard of hearing)   . Hyperlipidemia   . Hypertension     Past Surgical History:  Procedure Laterality Date  . APPENDECTOMY    . CHOLECYSTECTOMY      Social History:  reports that she has quit smoking. She has never used smokeless tobacco. She reports previous alcohol use. She reports that she does not use drugs.  Allergies: Allergies  Allergen Reactions  . Penicillins Other (See Comments)    Unknown to pt daughter   . Other Hives    Berries     Family History:  Family History  Problem Relation Age of Onset  . CAD Maternal Grandmother   . Pneumonia Maternal Grandfather      Current Outpatient Medications:  .  Accu-Chek FastClix Lancets MISC, Test glucose once daily. DxE11.9, Disp: 100 each, Rfl: 3 .  Alcohol Swabs (B-D SINGLE USE SWABS REGULAR) PADS, 1 each by Other route daily., Disp: 100 each, Rfl: 0 .  amitriptyline (ELAVIL) 25 MG tablet, Take 1 tablet (25 mg total) by mouth every other day., Disp: 90 tablet, Rfl: 1 .  amLODipine (NORVASC) 5 MG tablet, TAKE 1 TABLET EVERY DAY, Disp: 90 tablet, Rfl: 1 .  aspirin EC 81 MG tablet, Take 81 mg by mouth daily., Disp:  , Rfl:  .  atorvastatin (LIPITOR) 20 MG tablet, TAKE 1 TABLET EVERY DAY, Disp: 90 tablet, Rfl: 1 .  bisoprolol-hydrochlorothiazide (ZIAC) 2.5-6.25 MG tablet, TAKE 1 TABLET EVERY DAY, Disp: 90 tablet, Rfl: 1 .  Blood Glucose Monitoring Suppl (TRUE METRIX METER) w/Device KIT, USE AS DIRECTED, Disp: 1 kit, Rfl: 1 .  docusate sodium (COLACE) 100 MG capsule, Take 1 capsule (100 mg total) by mouth 2 (two) times daily., Disp: 60 capsule, Rfl: 11 .  fluticasone (FLONASE) 50 MCG/ACT nasal spray, Place 1 spray into both nostrils 2 (two) times a day., Disp: , Rfl:  .  metFORMIN (GLUCOPHAGE) 1000 MG tablet, TAKE 1 TABLET TWICE DAILY WITH MEALS, Disp: 180 tablet, Rfl: 1 .  Multiple Vitamins-Minerals (CENTRUM SILVER 50+WOMEN) TABS, Take 1 tablet by mouth daily., Disp: , Rfl:  .  Omega-3 Fatty Acids (FISH OIL) 1200 MG CAPS, Take 1,000 mg by mouth 2 (two) times a day., Disp: , Rfl:  .  OVER THE COUNTER MEDICATION, Take 1 tablet by mouth daily. **Probiotic**, Disp: , Rfl:  .  Polyethyl Glycol-Propyl Glycol (SYSTANE ULTRA OP), Place 1 drop into both eyes as needed (dry eyes)., Disp: , Rfl:  .  polyethylene glycol powder (GLYCOLAX/MIRALAX) 17 GM/SCOOP powder, Take 17 g by mouth 2 (two) times daily as needed. (Patient taking differently: Take 17 g by mouth 2 (two) times daily as needed for mild constipation. ), Disp: 3350 g, Rfl: 1 .  VITAMIN D, CHOLECALCIFEROL, PO, Take 2,000 Units by mouth daily., Disp: , Rfl:   Review of Systems:  Constitutional: Denies fever, chills, diaphoresis, appetite change and fatigue.  HEENT: Denies photophobia, eye pain, redness, hearing loss, ear pain, congestion, sore throat, rhinorrhea, sneezing, mouth sores, trouble swallowing, neck pain, neck stiffness and tinnitus.   Respiratory: Denies SOB, DOE, cough, chest tightness,  and wheezing.   Cardiovascular: Denies chest pain, palpitations and leg swelling.  Gastrointestinal: Denies nausea, vomiting, abdominal pain, diarrhea,  constipation, blood in stool and abdominal distention.  Genitourinary: Denies dysuria, urgency, frequency, hematuria, flank pain and difficulty urinating.  Endocrine: Denies: hot or cold intolerance, sweats, changes in hair or nails, polyuria, polydipsia. Musculoskeletal: Denies myalgias, back pain, joint swelling, arthralgias and gait problem.  Skin: Denies pallor, rash and wound.  Neurological: Denies dizziness, seizures, syncope, weakness, light-headedness, numbness and headaches.  Hematological: Denies adenopathy. Easy bruising, personal or family bleeding history  Psychiatric/Behavioral: Denies suicidal ideation, mood changes, confusion, nervousness, sleep disturbance and agitation    Physical Exam: Vitals:   04/10/20 0858  BP: 120/70  Pulse: 63  Temp: 98.2 F (36.8 C)  TempSrc: Oral  SpO2: 93%  Weight: 153 lb 9.6 oz (69.7 kg)    Body mass index is 27.21 kg/m.   Constitutional: NAD, calm, comfortable, ambulates with a walker Eyes: PERRL, lids and conjunctivae normal, wears corrective lenses ENMT: Mucous membranes are moist.  Respiratory: clear to auscultation bilaterally, no wheezing, no crackles. Normal respiratory effort. No accessory muscle use.  Cardiovascular: Regular rate and rhythm, no murmurs / rubs / gallops. No extremity edema. Neurologic: Grossly intact and nonfocal Psychiatric: Normal judgment and insight. Alert and oriented x 3. Normal mood.    Impression and Plan:  Type 2 diabetes mellitus with diabetic neuropathy, without long-term current use of insulin ([place])  -Well-controlled with an A1c of 6.3 today.  Essential hypertension -Well-controlled on amlodipine, bisoprolol, hydrochlorothiazide.  Hyperlipidemia associated with type 2 diabetes mellitus ([place]) -LDL at goal at 70 in August 2021, she remains on atorvastatin.  Need for influenza vaccination -Flu vaccine administered today.   Patient Instructions  -Nice seeing you today!!  -Flu vaccine  today.  -Schedule follow up in 3 months.     Lelon Frohlich, MD [facility] at [facility]

## 2020-04-10 NOTE — Addendum Note (Signed)
Addended by: Westley Hummer B on: 04/10/2020 04:12 PM   Modules accepted: Orders

## 2020-04-12 ENCOUNTER — Ambulatory Visit: Payer: Medicare HMO | Admitting: Internal Medicine

## 2020-05-31 ENCOUNTER — Other Ambulatory Visit: Payer: Self-pay | Admitting: Internal Medicine

## 2020-06-14 ENCOUNTER — Other Ambulatory Visit: Payer: Self-pay | Admitting: Internal Medicine

## 2020-06-19 ENCOUNTER — Telehealth: Payer: Self-pay | Admitting: Internal Medicine

## 2020-06-19 MED ORDER — GLUCOSE BLOOD VI STRP: ORAL_STRIP | 12 refills | Status: DC

## 2020-06-19 NOTE — Telephone Encounter (Signed)
Rx sent 

## 2020-06-19 NOTE — Telephone Encounter (Signed)
Pt states the wrong lancets and test strips were called into her pharmacy. The patient needs Accu-chek Drum Lancets and test strips.  [facility] - [place], [address]  [place], [place] [address]  Phone:  [phone] Fax:  [phone]

## 2020-06-24 ENCOUNTER — Other Ambulatory Visit: Payer: Self-pay | Admitting: Internal Medicine

## 2020-06-25 ENCOUNTER — Telehealth: Payer: Self-pay | Admitting: Internal Medicine

## 2020-06-25 DIAGNOSIS — J329 Chronic sinusitis, unspecified: Secondary | ICD-10-CM

## 2020-06-25 DIAGNOSIS — J302 Other seasonal allergic rhinitis: Secondary | ICD-10-CM

## 2020-06-25 NOTE — Telephone Encounter (Signed)
Pt daughter Olin Hauser call and want a call back about a referral for pt to see a ENT.

## 2020-06-26 ENCOUNTER — Other Ambulatory Visit: Payer: Self-pay

## 2020-06-26 NOTE — Telephone Encounter (Signed)
Ok with me 

## 2020-06-26 NOTE — Telephone Encounter (Signed)
Referral placed.

## 2020-06-26 NOTE — Telephone Encounter (Signed)
Spoke with daughter and the patient would like to see and ENT due to allergies/sinus.  Okay to refer?

## 2020-06-26 NOTE — Addendum Note (Signed)
Addended by: Westley Hummer B on: 06/26/2020 03:22 PM   Modules accepted: Orders

## 2020-07-04 ENCOUNTER — Other Ambulatory Visit: Payer: Self-pay | Admitting: Internal Medicine

## 2020-07-09 ENCOUNTER — Telehealth: Payer: Self-pay | Admitting: Internal Medicine

## 2020-07-09 NOTE — Telephone Encounter (Signed)
Patient's daughter Nevin Bloodgood says that the wrong Accu Check is being sent to the pharmacy.  States insurance will cover it but she had to go purchase the correct one.  Please advise.

## 2020-07-10 DIAGNOSIS — J300 Vasomotor rhinitis: Secondary | ICD-10-CM | POA: Diagnosis not present

## 2020-07-10 DIAGNOSIS — H9113 Presbycusis, bilateral: Secondary | ICD-10-CM | POA: Insufficient documentation

## 2020-07-10 DIAGNOSIS — L259 Unspecified contact dermatitis, unspecified cause: Secondary | ICD-10-CM | POA: Insufficient documentation

## 2020-07-10 NOTE — Telephone Encounter (Signed)
Spoke with daughter and [place].

## 2020-08-08 IMAGING — CT CT ABDOMEN AND PELVIS WITHOUT CONTRAST
2 of 4 series · 16 of 46 positions shown, 18 images · non-contrast
Comparison: None.

CLINICAL DATA: Nausea, vomiting, diarrhea for 4 days. History
Clostridium difficile, prior intestinal blockage.

EXAM:
CT ABDOMEN AND PELVIS WITHOUT CONTRAST
TECHNIQUE: Multidetector CT imaging of the abdomen and pelvis was performed
following the standard protocol without IV contrast.

[Series 2: axial st · axial · 0.87mm/px · z∈[+634,+1029]mm · 13 of 89 slices shown, 15 images]
[im 5/89  soft-tissue]
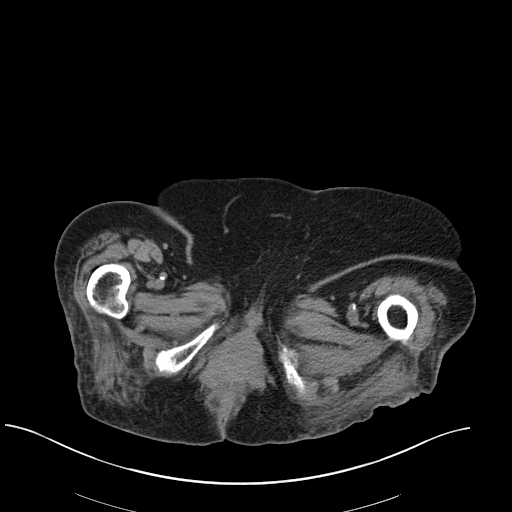
[im 5/89  bone]
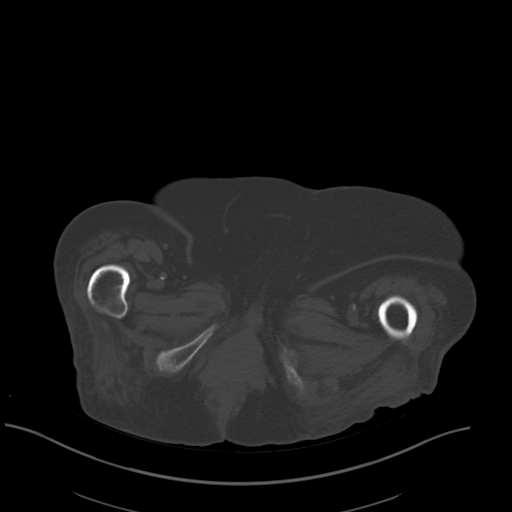
[im 13/89  soft-tissue]
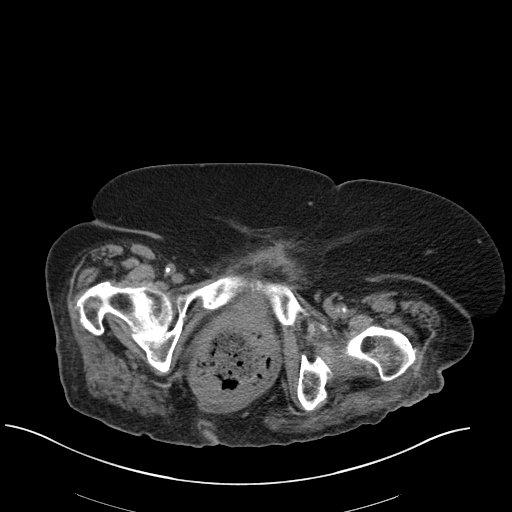
[im 17/89  soft-tissue]
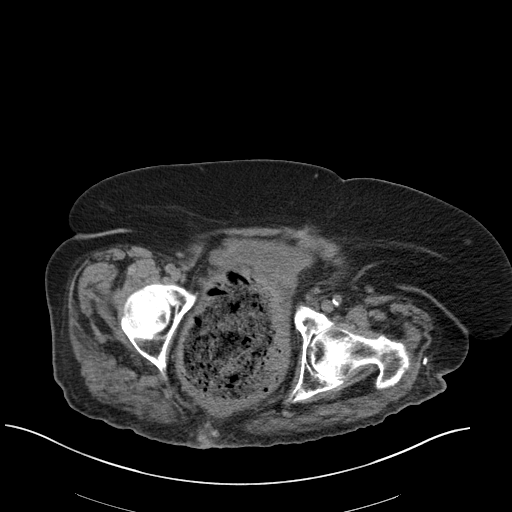
[im 26/89  soft-tissue]
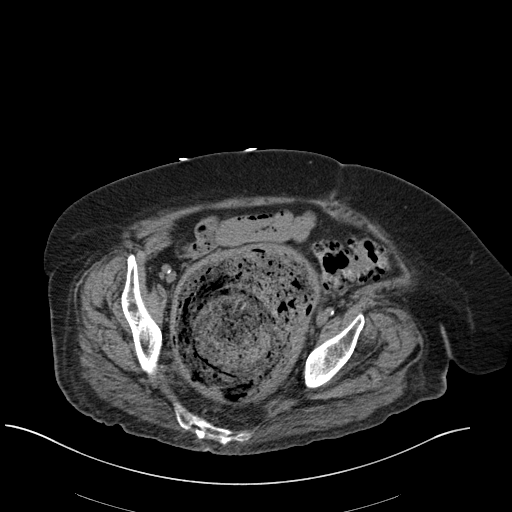
[im 30/89  soft-tissue]
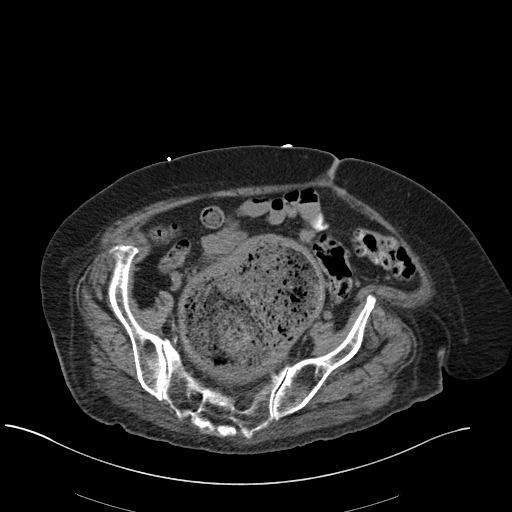
[im 38/89  soft-tissue]
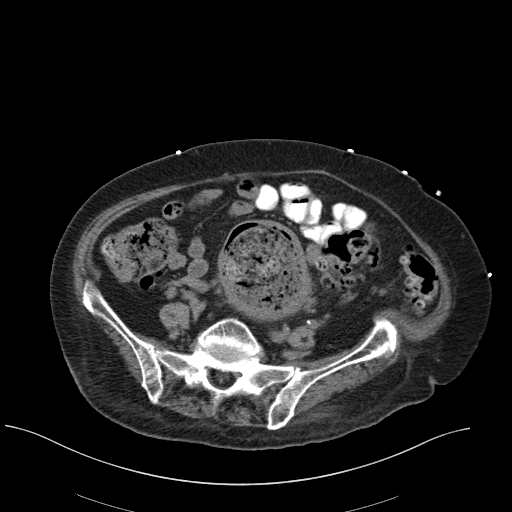
[im 47/89  soft-tissue]
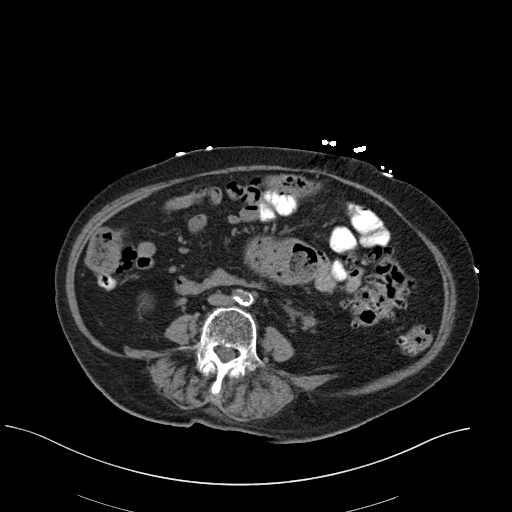
[im 51/89  soft-tissue]
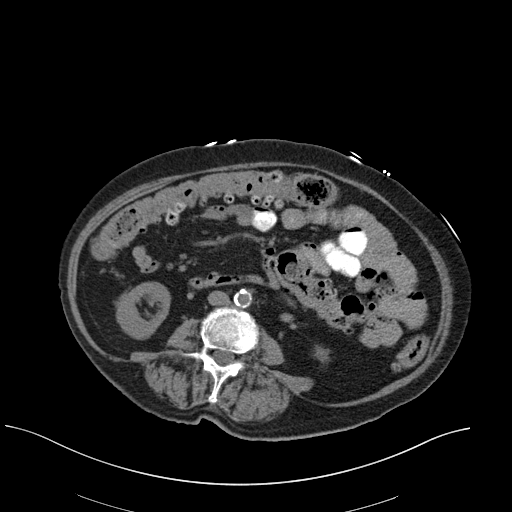
[im 59/89  soft-tissue]
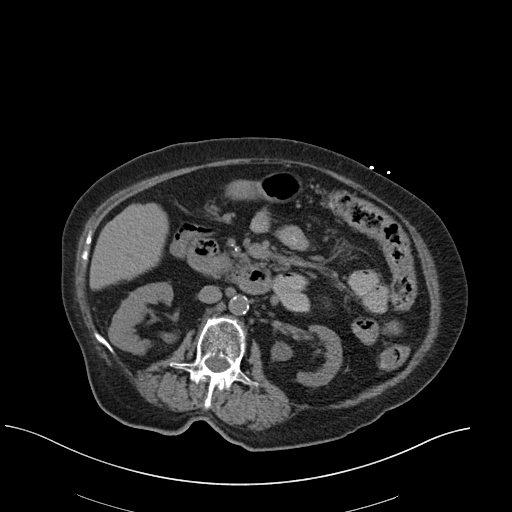
[im 59/89  bone]
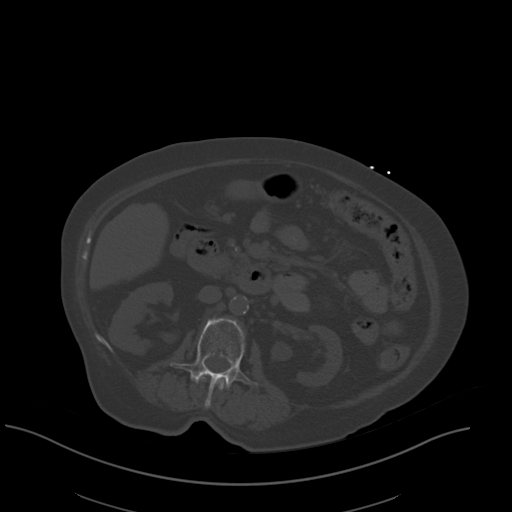
[im 63/89  soft-tissue]
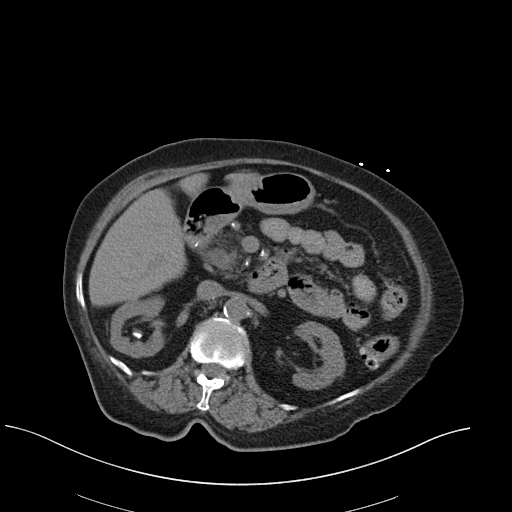
[im 72/89  soft-tissue]
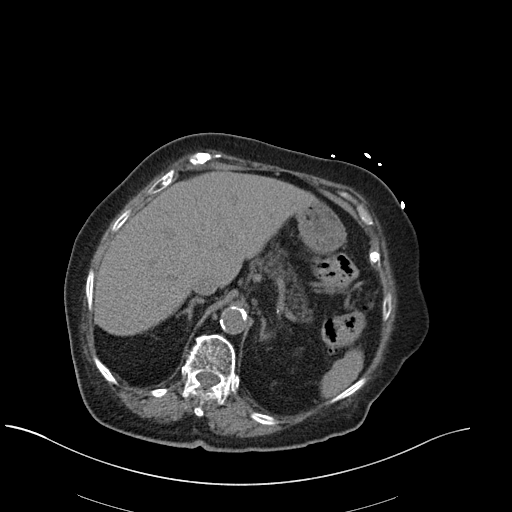
[im 76/89  soft-tissue]
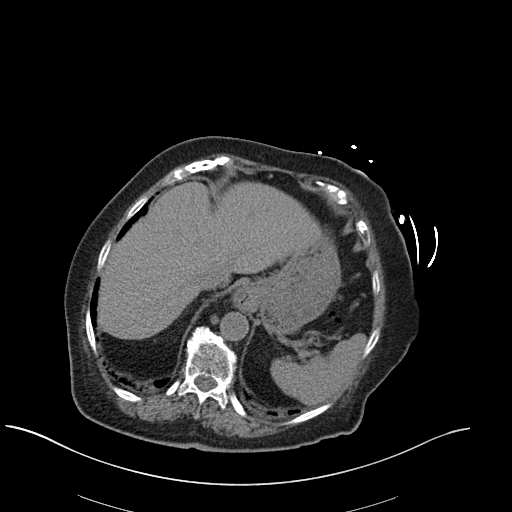
[im 84/89  soft-tissue]
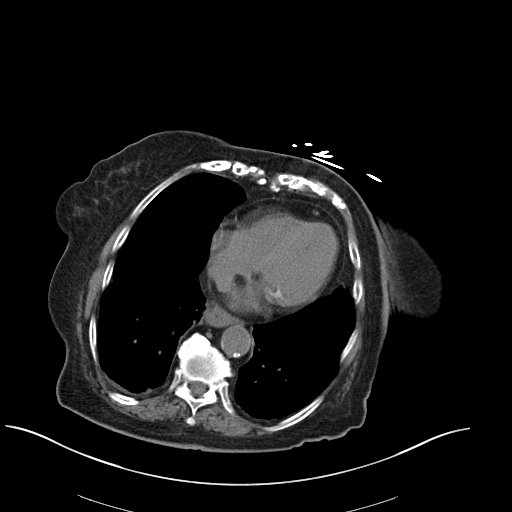

[Series 5: coronal st · coronal · 0.82mm/px · 3 of 142 slices shown]
[im 48/142  soft-tissue]
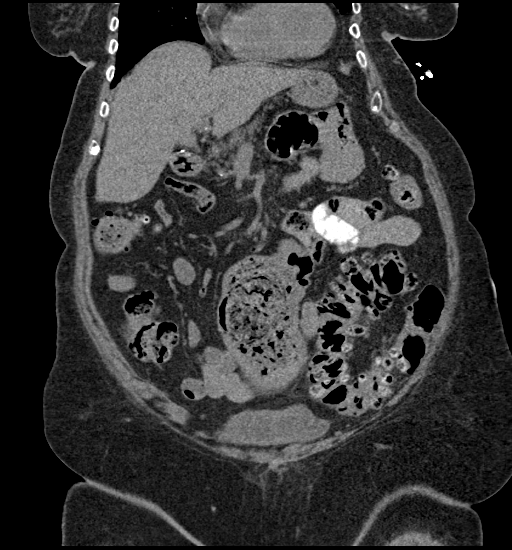
[im 63/142  soft-tissue]
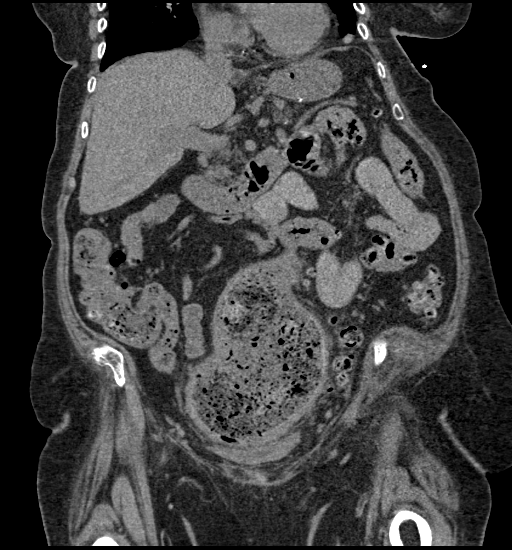
[im 79/142  soft-tissue]
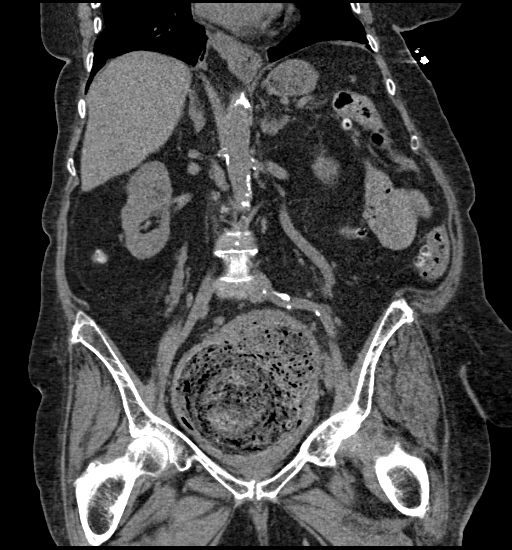

[16 of 46 positions shown; findings below may reference images not displayed]

FINDINGS: Lower chest: Bandlike areas of basilar atelectasis and/or scarring.
Cardiac size is enlarged. Extensive coronary calcifications are
present. Mitral annular calcifications are present as well.

Hepatobiliary: No concerning liver lesions. Post cholecystectomy.
Prominence of biliary tree, likely related to senescent change and
post cholecystectomy reservoir effect.

Pancreas: Diffuse fatty replacement of the pancreas. No pancreatic
ductal dilatation. No concerning pancreatic lesions.

Spleen: Normal in size without focal abnormality.

Adrenals/Urinary Tract: Bilateral cortical thinning, incompletely
assessed on enhanced CT. Coarse 9 mm calcification in the upper pole
right kidney. No obstructive urolithiasis. No hydronephrosis. Mild
symmetric bilateral perinephric stranding is nonspecific and may be
age related.

Stomach/Bowel: Mild distal esophageal thickening, nonspecific.
Moderate hiatal hernia. Small bowel is unremarkable. Enteric
contrast passes to the level of the proximal ileum. Pancolonic
diverticulosis. Marked distension of the distal sigmoid and rectal
vault with inspissated fecal material, this tool ball measures up to
15 cm in maximum transaxial dimension. Associated mural thickening
of the distal sigmoid and rectum with faint adjacent fat stranding.

Vascular/Lymphatic: Aortic atherosclerosis. No enlarged abdominal or
pelvic lymph nodes.

Reproductive: Uterus appears surgically absent. No concerning
adnexal lesions.

Other: No free fluid or free gas. No abdominal wall hernia. Minimal
posterior body wall edema.

Musculoskeletal: Multilevel discogenic and facet degenerative
changes of the imaged thoracolumbar spine. No suspicious osseous
lesions.
IMPRESSION: Marked distension of the distal sigmoid and rectal vault with
inspissated fecal material, consistent with fecal impaction.
Associated mural thickening of the distal sigmoid and rectum with
faint adjacent fat stranding, suggestive of stercoral colitis.

Colonic diverticulosis without evidence of active diverticulitis.

Bilateral cortical thinning of the kidneys. Nonobstructing right
nephrolithiasis.

Moderate hiatal hernia.

Mild nonspecific thickening of the distal esophagus, correlate with
features of esophagitis or sequela from recent emesis.

These results were called by telephone at the time of interpretation
on 12/07/2018 at [DATE] to Dr. AGNIESKA DOLOTOVA , who verbally
acknowledged these results.

## 2020-08-10 DIAGNOSIS — H903 Sensorineural hearing loss, bilateral: Secondary | ICD-10-CM | POA: Diagnosis not present

## 2020-08-27 DIAGNOSIS — H25811 Combined forms of age-related cataract, right eye: Secondary | ICD-10-CM | POA: Diagnosis not present

## 2020-08-30 ENCOUNTER — Other Ambulatory Visit: Payer: Self-pay | Admitting: Internal Medicine

## 2020-08-30 DIAGNOSIS — E785 Hyperlipidemia, unspecified: Secondary | ICD-10-CM

## 2020-08-30 DIAGNOSIS — E1169 Type 2 diabetes mellitus with other specified complication: Secondary | ICD-10-CM

## 2020-08-30 DIAGNOSIS — I10 Essential (primary) hypertension: Secondary | ICD-10-CM

## 2020-09-10 ENCOUNTER — Other Ambulatory Visit: Payer: Self-pay | Admitting: Internal Medicine

## 2020-09-11 ENCOUNTER — Other Ambulatory Visit: Payer: Self-pay | Admitting: Internal Medicine

## 2020-09-21 DIAGNOSIS — Z01818 Encounter for other preprocedural examination: Secondary | ICD-10-CM | POA: Diagnosis not present

## 2020-09-21 DIAGNOSIS — H2511 Age-related nuclear cataract, right eye: Secondary | ICD-10-CM | POA: Diagnosis not present

## 2020-09-21 DIAGNOSIS — H25811 Combined forms of age-related cataract, right eye: Secondary | ICD-10-CM | POA: Diagnosis not present

## 2020-09-27 ENCOUNTER — Telehealth: Payer: Self-pay | Admitting: Internal Medicine

## 2020-09-27 NOTE — Telephone Encounter (Signed)
Patient daughter is calling and stated that patient needs a new prescription for Tru Metrix Meter to be sent to   [facility]  [address] [place] [address]  Phone:  [phone] Fax:  [phone]  CB is [phone]

## 2020-09-28 MED ORDER — TRUE METRIX METER DEVI: 1.0000 | Freq: Every day | 1 refills | Status: DC

## 2020-09-28 NOTE — Telephone Encounter (Signed)
Rx sent 

## 2020-10-02 ENCOUNTER — Telehealth: Payer: Self-pay | Admitting: Internal Medicine

## 2020-10-02 MED ORDER — ACCU-CHEK GUIDE VI STRP: 1.0000 | ORAL_STRIP | Freq: Every day | 3 refills | Status: DC

## 2020-10-02 NOTE — Telephone Encounter (Signed)
rx sent

## 2020-10-02 NOTE — Telephone Encounter (Signed)
Pharmacy is calling in to get a prescription for Rx true Metrix test strips.  Pharm:  [facility].

## 2020-10-03 MED ORDER — TRUE METRIX BLOOD GLUCOSE TEST VI STRP: 1.0000 | ORAL_STRIP | Freq: Every day | 12 refills | Status: DC

## 2020-10-03 NOTE — Telephone Encounter (Signed)
Rx sent 

## 2020-10-03 NOTE — Telephone Encounter (Signed)
The patient stated that the wrong test strips were sent to [facility].   She no longer uses the Accu Chek meter she needs the test strips for TRUE METRIX.   [place], [place] Phone:  [phone]  Fax:  [phone]

## 2020-10-03 NOTE — Addendum Note (Signed)
Addended by: Westley Hummer B on: 10/03/2020 04:07 PM   Modules accepted: Orders

## 2020-10-09 NOTE — Telephone Encounter (Signed)
Pt is calling in to let us know that she will not receive her test strips for her True Metrix meter until November 08, 2020 and she does still her her old meter and supplies that go with it and wanted to know if this is okay for her to continue to use it until she gets the other supplies in.  Pt would like to have a call back.

## 2020-10-10 NOTE — Telephone Encounter (Signed)
Daughter will call back if anything further is needed.

## 2020-10-19 DIAGNOSIS — H2512 Age-related nuclear cataract, left eye: Secondary | ICD-10-CM | POA: Diagnosis not present

## 2020-10-19 DIAGNOSIS — H25811 Combined forms of age-related cataract, right eye: Secondary | ICD-10-CM | POA: Diagnosis not present

## 2020-12-28 DIAGNOSIS — H40013 Open angle with borderline findings, low risk, bilateral: Secondary | ICD-10-CM | POA: Diagnosis not present

## 2021-01-29 ENCOUNTER — Other Ambulatory Visit: Payer: Self-pay

## 2021-01-30 ENCOUNTER — Encounter: Payer: Self-pay | Admitting: Internal Medicine

## 2021-01-30 ENCOUNTER — Ambulatory Visit (INDEPENDENT_AMBULATORY_CARE_PROVIDER_SITE_OTHER)
Admission: RE | Admit: 2021-01-30 | Discharge: 2021-01-30 | Disposition: A | Discharge status: Home / Self Care | Payer: Medicare HMO | Source: Ambulatory Visit | Attending: Internal Medicine | Admitting: Internal Medicine

## 2021-01-30 ENCOUNTER — Ambulatory Visit (INDEPENDENT_AMBULATORY_CARE_PROVIDER_SITE_OTHER): Payer: Medicare HMO | Admitting: Internal Medicine

## 2021-01-30 ENCOUNTER — Other Ambulatory Visit: Payer: Self-pay | Admitting: Internal Medicine

## 2021-01-30 VITALS — BP 120/80 | HR 64 | Temp 98.1°F | Wt 154.0 lb

## 2021-01-30 DIAGNOSIS — K5641 Fecal impaction: Secondary | ICD-10-CM

## 2021-01-30 DIAGNOSIS — M19012 Primary osteoarthritis, left shoulder: Secondary | ICD-10-CM | POA: Diagnosis not present

## 2021-01-30 DIAGNOSIS — Z23 Encounter for immunization: Secondary | ICD-10-CM

## 2021-01-30 DIAGNOSIS — E1169 Type 2 diabetes mellitus with other specified complication: Secondary | ICD-10-CM | POA: Diagnosis not present

## 2021-01-30 DIAGNOSIS — M25512 Pain in left shoulder: Secondary | ICD-10-CM

## 2021-01-30 DIAGNOSIS — E785 Hyperlipidemia, unspecified: Secondary | ICD-10-CM | POA: Diagnosis not present

## 2021-01-30 DIAGNOSIS — I10 Essential (primary) hypertension: Secondary | ICD-10-CM

## 2021-01-30 LAB — POCT GLYCOSYLATED HEMOGLOBIN (HGB A1C): Hemoglobin A1C: 6.5 % — AB (ref 4.0–5.6)

## 2021-01-30 MED ORDER — MAGNESIUM CITRATE PO SOLN: 1.0000 | Freq: Once | ORAL | 1 refills | Status: DC

## 2021-01-30 NOTE — Progress Notes (Signed)
Established Patient Office Visit     This visit occurred during the SARS-CoV-2 public health emergency.  Safety protocols were in place, including screening questions prior to the visit, additional usage of staff PPE, and extensive cleaning of exam room while observing appropriate contact time as indicated for disinfecting solutions.    CC/Reason for Visit: Follow-up diabetes and discuss some acute concerns  HPI: Stacey Armstrong is a 87 y.o. female who is coming in today for the above mentioned reasons. Past Medical History is significant for: Hypertension, hyperlipidemia and type 2 diabetes.  She was hospitalized 2 years ago for fecal impaction.  She is concerned that she might be getting impacted again.  She has been having liquid brown stool without solid fecal material for the past few weeks.  She is consistent with her Colace and MiraLAX regimen.  She is requesting a flu vaccine today.  She has been having some left shoulder pain for about 3 weeks.  She sometimes feels a numbness around her biceps area.  Rarely does it extend down into her fingertips.  She does not have slurred speech, focal neurologic deficit, chest pain or dyspnea on exertion.   Past Medical/Surgical History: Past Medical History:  Diagnosis Date   Diabetes ([place])    HOH (hard of hearing)    Hyperlipidemia    Hypertension     Past Surgical History:  Procedure Laterality Date   APPENDECTOMY     CHOLECYSTECTOMY      Social History:  reports that she has quit smoking. She has never used smokeless tobacco. She reports that she does not currently use alcohol. She reports that she does not use drugs.  Allergies: Allergies  Allergen Reactions   Penicillins Other (See Comments)    Unknown to pt daughter    Other Hives    Berries     Family History:  Family History  Problem Relation Age of Onset   CAD Maternal Grandmother    Pneumonia Maternal Grandfather      Current Outpatient Medications:     Alcohol Swabs (B-D SINGLE USE SWABS REGULAR) PADS, USE AS DIRECTED EVERY DAY, Disp: 100 each, Rfl: 0   amitriptyline (ELAVIL) 25 MG tablet, TAKE 1 TABLET EVERY OTHER DAY, Disp: 45 tablet, Rfl: 0   amLODipine (NORVASC) 5 MG tablet, TAKE 1 TABLET EVERY DAY, Disp: 90 tablet, Rfl: 1   aspirin EC 81 MG tablet, Take 81 mg by mouth daily., Disp: , Rfl:    atorvastatin (LIPITOR) 20 MG tablet, TAKE 1 TABLET EVERY DAY, Disp: 90 tablet, Rfl: 1   bisoprolol-hydrochlorothiazide (ZIAC) 2.5-6.25 MG tablet, TAKE 1 TABLET EVERY DAY, Disp: 90 tablet, Rfl: 1   Blood Glucose Monitoring Suppl (TRUE METRIX METER) DEVI, 1 each by Does not apply route daily., Disp: 1 each, Rfl: 1   docusate sodium (COLACE) 100 MG capsule, Take 1 capsule (100 mg total) by mouth 2 (two) times daily., Disp: 60 capsule, Rfl: 11   fluticasone (FLONASE) 50 MCG/ACT nasal spray, Place 1 spray into both nostrils 2 (two) times a day., Disp: , Rfl:    glucose blood (TRUE METRIX BLOOD GLUCOSE TEST) test strip, 1 each by Other route daily. Dx E11.9, Disp: 100 each, Rfl: 12   magnesium citrate SOLN, Take 296 mLs (1 Bottle total) by mouth once for 1 dose., Disp: 195 mL, Rfl: 1   metFORMIN (GLUCOPHAGE) 1000 MG tablet, TAKE 1 TABLET TWICE DAILY WITH MEALS, Disp: 180 tablet, Rfl: 1   Multiple Vitamins-Minerals (CENTRUM SILVER  50+WOMEN) TABS, Take 1 tablet by mouth daily., Disp: , Rfl:    Omega-3 Fatty Acids (FISH OIL) 1200 MG CAPS, Take 1,000 mg by mouth 2 (two) times a day., Disp: , Rfl:    OVER THE COUNTER MEDICATION, Take 1 tablet by mouth daily. **Probiotic**, Disp: , Rfl:    Polyethyl Glycol-Propyl Glycol (SYSTANE ULTRA OP), Place 1 drop into both eyes as needed (dry eyes)., Disp: , Rfl:    polyethylene glycol powder (GLYCOLAX/MIRALAX) 17 GM/SCOOP powder, Take 17 g by mouth 2 (two) times daily as needed. (Patient taking differently: Take 17 g by mouth 2 (two) times daily as needed for mild constipation.), Disp: 3350 g, Rfl: 1   VITAMIN D,  CHOLECALCIFEROL, PO, Take 2,000 Units by mouth daily., Disp: , Rfl:   Review of Systems:  Constitutional: Denies fever, chills, diaphoresis, appetite change and fatigue.  HEENT: Denies photophobia, eye pain, redness, hearing loss, ear pain, congestion, sore throat, rhinorrhea, sneezing, mouth sores, trouble swallowing, neck pain, neck stiffness and tinnitus.   Respiratory: Denies SOB, DOE, cough, chest tightness,  and wheezing.   Cardiovascular: Denies chest pain, palpitations and leg swelling.  Gastrointestinal: Denies nausea, vomiting, abdominal pain, blood in stool and abdominal distention.  Genitourinary: Denies dysuria, urgency, frequency, hematuria, flank pain and difficulty urinating.  Endocrine: Denies: hot or cold intolerance, sweats, changes in hair or nails, polyuria, polydipsia. Musculoskeletal: Denies myalgias, back pain, joint swelling,  and gait problem.  Skin: Denies pallor, rash and wound.  Neurological: Denies dizziness, seizures, syncope, weakness, light-headedness,  and headaches.  Hematological: Denies adenopathy. Easy bruising, personal or family bleeding history  Psychiatric/Behavioral: Denies suicidal ideation, mood changes, confusion, nervousness, sleep disturbance and agitation    Physical Exam: Vitals:   01/30/21 1127  BP: 120/80  Pulse: 64  Temp: 98.1 F (36.7 C)  TempSrc: Oral  SpO2: 94%  Weight: 154 lb (69.9 kg)    Body mass index is 27.28 kg/m.   Constitutional: NAD, calm, comfortable Eyes: PERRL, lids and conjunctivae normal, wears corrective lenses ENMT: Mucous membranes are moist.  Respiratory: clear to auscultation bilaterally, no wheezing, no crackles. Normal respiratory effort. No accessory muscle use.  Cardiovascular: Regular rate and rhythm, no murmurs / rubs / gallops. No extremity edema.   Musculoskeletal: Decreased range of motion of her shoulder especially in extreme abduction and abduction.  No issues with external/internal rotation.   I was able to replicate the numbness in her biceps area with extreme abduction. Psychiatric: Normal judgment and insight. Alert and oriented x 3. Normal mood.    Impression and Plan:  Type 2 diabetes mellitus with hyperlipidemia ([place])  - Plan: POCT glycosylated hemoglobin (Hb A1C) -Well-controlled with an A1c of 6.5 today.  Fecal impaction ([place])  - Plan: magnesium citrate SOLN -She has a history of fecal impaction and suspect she is having escape of liquid stools surrounding her impaction. -I will prescribe mag citrate in addition to MiraLAX and Colace.  Essential hypertension -Well-controlled.  Acute pain of left shoulder  - Plan: DG Shoulder Left -By description, sounds like an impingement issue of the shoulder. -X-ray today, physical therapy requested, if persist can consider sports medicine/orthopedic referral.  Need for influenza vaccination -Flu vaccine administered today.    Patient Instructions  -Nice seeing you today!!  -Take 1 bottle of mag citrate today. May take second bottle in 2 days if no significant improvement.  -Shoulder xray today.  -Flu vaccine today.    Lelon Frohlich, MD [facility] at [facility]

## 2021-01-30 NOTE — Patient Instructions (Signed)
-  Nice seeing you today!!  -Take 1 bottle of mag citrate today. May take second bottle in 2 days if no significant improvement.  -Shoulder xray today.  -Flu vaccine today.

## 2021-01-30 NOTE — Addendum Note (Signed)
Addended by: Westley Hummer B on: 01/30/2021 05:00 PM   Modules accepted: Orders

## 2021-01-31 ENCOUNTER — Encounter: Payer: Self-pay | Admitting: Internal Medicine

## 2021-01-31 DIAGNOSIS — M25512 Pain in left shoulder: Secondary | ICD-10-CM

## 2021-02-06 ENCOUNTER — Other Ambulatory Visit: Payer: Self-pay | Admitting: Internal Medicine

## 2021-02-06 DIAGNOSIS — I10 Essential (primary) hypertension: Secondary | ICD-10-CM

## 2021-02-06 DIAGNOSIS — E1169 Type 2 diabetes mellitus with other specified complication: Secondary | ICD-10-CM

## 2021-02-10 ENCOUNTER — Other Ambulatory Visit: Payer: Self-pay | Admitting: Internal Medicine

## 2021-02-21 DIAGNOSIS — R2689 Other abnormalities of gait and mobility: Secondary | ICD-10-CM | POA: Diagnosis not present

## 2021-02-21 DIAGNOSIS — M19012 Primary osteoarthritis, left shoulder: Secondary | ICD-10-CM | POA: Diagnosis not present

## 2021-02-26 DIAGNOSIS — R2689 Other abnormalities of gait and mobility: Secondary | ICD-10-CM | POA: Diagnosis not present

## 2021-02-26 DIAGNOSIS — M19012 Primary osteoarthritis, left shoulder: Secondary | ICD-10-CM | POA: Diagnosis not present

## 2021-03-05 DIAGNOSIS — R2689 Other abnormalities of gait and mobility: Secondary | ICD-10-CM | POA: Diagnosis not present

## 2021-03-05 DIAGNOSIS — M19012 Primary osteoarthritis, left shoulder: Secondary | ICD-10-CM | POA: Diagnosis not present

## 2021-03-07 DIAGNOSIS — M19012 Primary osteoarthritis, left shoulder: Secondary | ICD-10-CM | POA: Diagnosis not present

## 2021-03-07 DIAGNOSIS — R2689 Other abnormalities of gait and mobility: Secondary | ICD-10-CM | POA: Diagnosis not present

## 2021-03-12 DIAGNOSIS — M19012 Primary osteoarthritis, left shoulder: Secondary | ICD-10-CM | POA: Diagnosis not present

## 2021-03-12 DIAGNOSIS — R2689 Other abnormalities of gait and mobility: Secondary | ICD-10-CM | POA: Diagnosis not present

## 2021-03-19 DIAGNOSIS — R2689 Other abnormalities of gait and mobility: Secondary | ICD-10-CM | POA: Diagnosis not present

## 2021-03-19 DIAGNOSIS — M19012 Primary osteoarthritis, left shoulder: Secondary | ICD-10-CM | POA: Diagnosis not present

## 2021-04-09 ENCOUNTER — Ambulatory Visit (INDEPENDENT_AMBULATORY_CARE_PROVIDER_SITE_OTHER): Payer: Medicare HMO | Admitting: Internal Medicine

## 2021-04-09 VITALS — BP 120/84 | Temp 97.8°F | Wt 154.2 lb

## 2021-04-09 DIAGNOSIS — R319 Hematuria, unspecified: Secondary | ICD-10-CM

## 2021-04-09 DIAGNOSIS — N3001 Acute cystitis with hematuria: Secondary | ICD-10-CM | POA: Diagnosis not present

## 2021-04-09 LAB — POCT URINALYSIS DIPSTICK
Bilirubin, UA: NEGATIVE
Blood, UA: POSITIVE
Glucose, UA: NEGATIVE
Ketones, UA: NEGATIVE
Nitrite, UA: NEGATIVE
Protein, UA: POSITIVE — AB
Spec Grav, UA: 1.025 (ref 1.010–1.025)
Urobilinogen, UA: 0.2 E.U./dL
pH, UA: 6 (ref 5.0–8.0)

## 2021-04-09 LAB — URINALYSIS, ROUTINE W REFLEX MICROSCOPIC
Ketones, ur: NEGATIVE
Nitrite: POSITIVE — AB
Specific Gravity, Urine: 1.025 (ref 1.000–1.030)
Total Protein, Urine: 100 — AB
Urine Glucose: NEGATIVE
Urobilinogen, UA: 1 (ref 0.0–1.0)
pH: 5.5 (ref 5.0–8.0)

## 2021-04-09 MED ORDER — ACCU-CHEK FASTCLIX LANCETS MISC: 1.0000 | Freq: Every day | 3 refills | Status: DC

## 2021-04-09 MED ORDER — SULFAMETHOXAZOLE-TRIMETHOPRIM 800-160 MG PO TABS
1.0000 | ORAL_TABLET | Freq: Two times a day (BID) | ORAL | 0 refills | Status: AC
Start: 2021-04-09 — End: 2021-04-16

## 2021-04-09 NOTE — Patient Instructions (Signed)
-  Nice seeing you today!!  -Start Bactrim DS 1 tablet twice daily for 7 days.

## 2021-04-09 NOTE — Progress Notes (Signed)
Established Patient Office Visit     This visit occurred during the SARS-CoV-2 public health emergency.  Safety protocols were in place, including screening questions prior to the visit, additional usage of staff PPE, and extensive cleaning of exam room while observing appropriate contact time as indicated for disinfecting solutions.    CC/Reason for Visit: Blood in urine  HPI: Stacey Armstrong is a 87 y.o. female who is coming in today for the above mentioned reasons.  For the past 4 days she has noticed hematuria.  No dysuria, there has been some increased urinary frequency.  No fever.  Past Medical/Surgical History: Past Medical History:  Diagnosis Date   Diabetes ([place])    HOH (hard of hearing)    Hyperlipidemia    Hypertension     Past Surgical History:  Procedure Laterality Date   APPENDECTOMY     CHOLECYSTECTOMY      Social History:  reports that she has quit smoking. She has never used smokeless tobacco. She reports that she does not currently use alcohol. She reports that she does not use drugs.  Allergies: Allergies  Allergen Reactions   Penicillins Other (See Comments)    Unknown to pt daughter    Other Hives    Berries     Family History:  Family History  Problem Relation Age of Onset   CAD Maternal Grandmother    Pneumonia Maternal Grandfather      Current Outpatient Medications:    Accu-Chek FastClix Lancets MISC, 1 each by Does not apply route daily., Disp: 102 each, Rfl: 3   Alcohol Swabs (B-D SINGLE USE SWABS REGULAR) PADS, USE AS DIRECTED EVERY DAY, Disp: 100 each, Rfl: 0   amitriptyline (ELAVIL) 25 MG tablet, TAKE 1 TABLET EVERY OTHER DAY, Disp: 45 tablet, Rfl: 0   amLODipine (NORVASC) 5 MG tablet, TAKE 1 TABLET EVERY DAY, Disp: 90 tablet, Rfl: 1   aspirin EC 81 MG tablet, Take 81 mg by mouth daily., Disp: , Rfl:    atorvastatin (LIPITOR) 20 MG tablet, TAKE 1 TABLET EVERY DAY, Disp: 90 tablet, Rfl: 1   bisoprolol-hydrochlorothiazide (ZIAC)  2.5-6.25 MG tablet, TAKE 1 TABLET EVERY DAY, Disp: 90 tablet, Rfl: 1   Blood Glucose Monitoring Suppl (TRUE METRIX METER) DEVI, 1 each by Does not apply route daily., Disp: 1 each, Rfl: 1   CVS MAGNESIUM CITRATE 1.745 GM/30ML SOLN, TAKE 296 MLS (1 BOTTLE TOTAL) BY MOUTH ONCE FOR 1 DOSE., Disp: 195 mL, Rfl: 1   docusate sodium (COLACE) 100 MG capsule, Take 1 capsule (100 mg total) by mouth 2 (two) times daily., Disp: 60 capsule, Rfl: 11   glucose blood (TRUE METRIX BLOOD GLUCOSE TEST) test strip, 1 each by Other route daily. Dx E11.9, Disp: 100 each, Rfl: 12   metFORMIN (GLUCOPHAGE) 1000 MG tablet, TAKE 1 TABLET TWICE DAILY WITH MEALS, Disp: 180 tablet, Rfl: 1   Multiple Vitamins-Minerals (CENTRUM SILVER 50+WOMEN) TABS, Take 1 tablet by mouth daily., Disp: , Rfl:    Omega-3 Fatty Acids (FISH OIL) 1200 MG CAPS, Take 1,000 mg by mouth 2 (two) times a day., Disp: , Rfl:    OVER THE COUNTER MEDICATION, Take 1 tablet by mouth daily. **Probiotic**, Disp: , Rfl:    Polyethyl Glycol-Propyl Glycol (SYSTANE ULTRA OP), Place 1 drop into both eyes as needed (dry eyes)., Disp: , Rfl:    polyethylene glycol powder (GLYCOLAX/MIRALAX) 17 GM/SCOOP powder, Take 17 g by mouth 2 (two) times daily as needed. (Patient taking differently: Take  17 g by mouth 2 (two) times daily as needed for mild constipation.), Disp: 3350 g, Rfl: 1   sulfamethoxazole-trimethoprim (BACTRIM DS) 800-160 MG tablet, Take 1 tablet by mouth 2 (two) times daily for 7 days., Disp: 14 tablet, Rfl: 0   VITAMIN D, CHOLECALCIFEROL, PO, Take 2,000 Units by mouth daily., Disp: , Rfl:   Review of Systems:  Constitutional: Denies fever, chills, diaphoresis, appetite change and fatigue.  HEENT: Denies photophobia, eye pain, redness, hearing loss, ear pain, congestion, sore throat, rhinorrhea, sneezing, mouth sores, trouble swallowing, neck pain, neck stiffness and tinnitus.   Respiratory: Denies SOB, DOE, cough, chest tightness,  and wheezing.    Cardiovascular: Denies chest pain, palpitations and leg swelling.  Gastrointestinal: Denies nausea, vomiting, abdominal pain, diarrhea, constipation, blood in stool and abdominal distention.  Genitourinary: Denies dysuria,  flank pain and difficulty urinating.  Positive for frequency, urgency, hematuria Endocrine: Denies: hot or cold intolerance, sweats, changes in hair or nails, polyuria, polydipsia. Musculoskeletal: Denies myalgias, back pain, joint swelling, arthralgias and gait problem.  Skin: Denies pallor, rash and wound.  Neurological: Denies dizziness, seizures, syncope, weakness, light-headedness, numbness and headaches.  Hematological: Denies adenopathy. Easy bruising, personal or family bleeding history  Psychiatric/Behavioral: Denies suicidal ideation, mood changes, confusion, nervousness, sleep disturbance and agitation    Physical Exam: Vitals:   04/09/21 1103  BP: 120/84  Temp: 97.8 F (36.6 C)  TempSrc: Oral  Weight: 154 lb 3.2 oz (69.9 kg)    Body mass index is 27.32 kg/m.   Constitutional: NAD, calm, comfortable Eyes: PERRL, lids and conjunctivae normal ENMT: Mucous membranes are moist.   Psychiatric: Normal judgment and insight. Alert and oriented x 3. Normal mood.    Impression and Plan:  Hematuria, unspecified type - Plan: POCT urinalysis dipstick, Urinalysis, Urine Culture, Urine Culture, Urinalysis  Acute cystitis with hematuria - Plan: sulfamethoxazole-trimethoprim (BACTRIM DS) 800-160 MG tablet  -In office urine dipstick positive for blood, leukocytes and protein. -She will be started on Bactrim DS twice daily for 7 days.  Sent for UA and culture.    Patient Instructions  -Nice seeing you today!!  -Start Bactrim DS 1 tablet twice daily for 7 days.     Lelon Frohlich, MD [facility] at [facility]

## 2021-04-10 LAB — URINE CULTURE
MICRO NUMBER:: 12639467
Result:: NO GROWTH
SPECIMEN QUALITY:: ADEQUATE

## 2021-04-19 ENCOUNTER — Encounter: Payer: Self-pay | Admitting: Internal Medicine

## 2021-04-22 ENCOUNTER — Telehealth: Payer: Self-pay | Admitting: Internal Medicine

## 2021-04-22 NOTE — Telephone Encounter (Signed)
Pt daughter call and stated she want Apolonio Schneiders to call her back about a appt dr.Hernandez didn't have one tomorrow .

## 2021-04-23 ENCOUNTER — Other Ambulatory Visit (INDEPENDENT_AMBULATORY_CARE_PROVIDER_SITE_OTHER): Payer: Medicare HMO | Admitting: Internal Medicine

## 2021-04-23 ENCOUNTER — Other Ambulatory Visit: Payer: Self-pay | Admitting: Internal Medicine

## 2021-04-23 DIAGNOSIS — R319 Hematuria, unspecified: Secondary | ICD-10-CM

## 2021-04-23 NOTE — Telephone Encounter (Signed)
Spoke with daughter.  She will come by and pick up a cup for an urine sample.  Dr Jerilee Hoh has also ordered imaging of the kidneys.

## 2021-04-23 NOTE — Telephone Encounter (Signed)
Pt daughter Nevin Bloodgood is calling and her mother is still having blood in her urine

## 2021-04-24 ENCOUNTER — Other Ambulatory Visit: Payer: Medicare HMO

## 2021-04-24 ENCOUNTER — Ambulatory Visit
Admission: RE | Admit: 2021-04-24 | Discharge: 2021-04-24 | Disposition: A | Discharge status: Home / Self Care | Payer: Medicare HMO | Source: Ambulatory Visit | Attending: Internal Medicine | Admitting: Internal Medicine

## 2021-04-24 DIAGNOSIS — S32502A Unspecified fracture of left pubis, initial encounter for closed fracture: Secondary | ICD-10-CM | POA: Diagnosis not present

## 2021-04-24 DIAGNOSIS — R319 Hematuria, unspecified: Secondary | ICD-10-CM | POA: Diagnosis not present

## 2021-04-24 DIAGNOSIS — K5730 Diverticulosis of large intestine without perforation or abscess without bleeding: Secondary | ICD-10-CM | POA: Diagnosis not present

## 2021-04-24 DIAGNOSIS — N200 Calculus of kidney: Secondary | ICD-10-CM | POA: Diagnosis not present

## 2021-04-24 LAB — POCT URINALYSIS DIPSTICK
Bilirubin, UA: NEGATIVE
Glucose, UA: NEGATIVE
Ketones, UA: NEGATIVE
Nitrite, UA: NEGATIVE
Protein, UA: POSITIVE — AB
Spec Grav, UA: 1.02 (ref 1.010–1.025)
Urobilinogen, UA: 0.2 E.U./dL
pH, UA: 6 (ref 5.0–8.0)

## 2021-04-24 NOTE — Addendum Note (Signed)
Addended by: Westley Hummer B on: 04/24/2021 11:20 AM   Modules accepted: Orders

## 2021-04-25 ENCOUNTER — Other Ambulatory Visit: Payer: Self-pay | Admitting: Internal Medicine

## 2021-04-25 DIAGNOSIS — N3001 Acute cystitis with hematuria: Secondary | ICD-10-CM

## 2021-04-25 DIAGNOSIS — N200 Calculus of kidney: Secondary | ICD-10-CM

## 2021-04-25 DIAGNOSIS — R319 Hematuria, unspecified: Secondary | ICD-10-CM

## 2021-05-07 DIAGNOSIS — N3941 Urge incontinence: Secondary | ICD-10-CM | POA: Diagnosis not present

## 2021-05-07 DIAGNOSIS — R310 Gross hematuria: Secondary | ICD-10-CM | POA: Diagnosis not present

## 2021-05-07 DIAGNOSIS — R350 Frequency of micturition: Secondary | ICD-10-CM | POA: Diagnosis not present

## 2021-05-26 DIAGNOSIS — Z8616 Personal history of COVID-19: Secondary | ICD-10-CM

## 2021-05-26 DIAGNOSIS — N200 Calculus of kidney: Secondary | ICD-10-CM

## 2021-05-26 HISTORY — DX: Calculus of kidney: N20.0

## 2021-05-26 HISTORY — DX: Personal history of COVID-19: Z86.16

## 2021-06-07 DIAGNOSIS — R310 Gross hematuria: Secondary | ICD-10-CM | POA: Diagnosis not present

## 2021-06-17 ENCOUNTER — Telehealth (INDEPENDENT_AMBULATORY_CARE_PROVIDER_SITE_OTHER): Payer: Medicare HMO | Admitting: Internal Medicine

## 2021-06-17 VITALS — BP 154/78

## 2021-06-17 DIAGNOSIS — U071 COVID-19: Secondary | ICD-10-CM

## 2021-06-17 MED ORDER — MOLNUPIRAVIR EUA 200MG CAPSULE: 4.0000 | ORAL_CAPSULE | Freq: Two times a day (BID) | ORAL | 0 refills | Status: AC

## 2021-06-17 NOTE — Progress Notes (Signed)
Virtual Visit via Telephone Note  I connected with Stacey Armstrong on 06/17/21 at  2:30 PM EST by telephone and verified that I am speaking with the correct person using two identifiers.   I discussed the limitations, risks, security and privacy concerns of performing an evaluation and management service by telephone and the availability of in person appointments. I also discussed with the patient that there may be a patient responsible charge related to this service. The patient expressed understanding and agreed to proceed.  Location patient: home Location provider: work office Participants present for the call: patient, provider Patient did not have a visit in the prior 7 days to address this/these issue(s).   History of Present Illness:  She has scheduled this visit to inform me that she has tested positive for COVID.  Her symptoms started 3 days ago with cough, sneezing, nasal congestion, rhinorrhea, headache, chills, body aches.  She finally took a COVID test yesterday and was positive.   Observations/Objective: Patient sounds congested and well on the phone. I do not appreciate any increased work of breathing. Speech and thought processing are grossly intact. Patient reported vitals: None reported   Current Outpatient Medications:    Accu-Chek FastClix Lancets MISC, 1 each by Does not apply route daily., Disp: 102 each, Rfl: 3   Alcohol Swabs (B-D SINGLE USE SWABS REGULAR) PADS, USE AS DIRECTED EVERY DAY, Disp: 100 each, Rfl: 0   amitriptyline (ELAVIL) 25 MG tablet, TAKE 1 TABLET EVERY OTHER DAY, Disp: 45 tablet, Rfl: 0   amLODipine (NORVASC) 5 MG tablet, TAKE 1 TABLET EVERY DAY, Disp: 90 tablet, Rfl: 1   aspirin EC 81 MG tablet, Take 81 mg by mouth daily., Disp: , Rfl:    atorvastatin (LIPITOR) 20 MG tablet, TAKE 1 TABLET EVERY DAY, Disp: 90 tablet, Rfl: 1   bisoprolol-hydrochlorothiazide (ZIAC) 2.5-6.25 MG tablet, TAKE 1 TABLET EVERY DAY, Disp: 90 tablet, Rfl: 1   Blood  Glucose Monitoring Suppl (TRUE METRIX METER) DEVI, 1 each by Does not apply route daily., Disp: 1 each, Rfl: 1   CVS MAGNESIUM CITRATE 1.745 GM/30ML SOLN, TAKE 296 MLS (1 BOTTLE TOTAL) BY MOUTH ONCE FOR 1 DOSE., Disp: 195 mL, Rfl: 1   docusate sodium (COLACE) 100 MG capsule, Take 1 capsule (100 mg total) by mouth 2 (two) times daily., Disp: 60 capsule, Rfl: 11   glucose blood (TRUE METRIX BLOOD GLUCOSE TEST) test strip, 1 each by Other route daily. Dx E11.9, Disp: 100 each, Rfl: 12   metFORMIN (GLUCOPHAGE) 1000 MG tablet, TAKE 1 TABLET TWICE DAILY WITH MEALS, Disp: 180 tablet, Rfl: 1   molnupiravir EUA (LAGEVRIO) 200 mg CAPS capsule, Take 4 capsules (800 mg total) by mouth 2 (two) times daily for 5 days., Disp: 40 capsule, Rfl: 0   Multiple Vitamins-Minerals (CENTRUM SILVER 50+WOMEN) TABS, Take 1 tablet by mouth daily., Disp: , Rfl:    Omega-3 Fatty Acids (FISH OIL) 1200 MG CAPS, Take 1,000 mg by mouth 2 (two) times a day., Disp: , Rfl:    OVER THE COUNTER MEDICATION, Take 1 tablet by mouth daily. **Probiotic**, Disp: , Rfl:    Polyethyl Glycol-Propyl Glycol (SYSTANE ULTRA OP), Place 1 drop into both eyes as needed (dry eyes)., Disp: , Rfl:    polyethylene glycol powder (GLYCOLAX/MIRALAX) 17 GM/SCOOP powder, Take 17 g by mouth 2 (two) times daily as needed. (Patient taking differently: Take 17 g by mouth 2 (two) times daily as needed for mild constipation.), Disp: 3350 g, Rfl: 1  VITAMIN D, CHOLECALCIFEROL, PO, Take 2,000 Units by mouth daily., Disp: , Rfl:    ipratropium (ATROVENT) 0.03 % nasal spray, Place into both nostrils., Disp: , Rfl:   Review of Systems:  Constitutional: Denies fever, chills, diaphoresis, appetite change. HEENT: Denies photophobia, eye pain, redness, , mouth sores, trouble swallowing, neck pain, neck stiffness and tinnitus.   Respiratory: Denies SOB, DOE,  chest tightness,  and wheezing.   Cardiovascular: Denies chest pain, palpitations and leg swelling.   Gastrointestinal: Denies nausea, vomiting, abdominal pain, diarrhea, constipation, blood in stool and abdominal distention.  Genitourinary: Denies dysuria, urgency, frequency, hematuria, flank pain and difficulty urinating.  Endocrine: Denies: hot or cold intolerance, sweats, changes in hair or nails, polyuria, polydipsia. Musculoskeletal: Denies  back pain, joint swelling, arthralgias and gait problem.  Skin: Denies pallor, rash and wound.  Neurological: Denies dizziness, seizures, syncope,  light-headedness, numbness and headaches.  Hematological: Denies adenopathy. Easy bruising, personal or family bleeding history  Psychiatric/Behavioral: Denies suicidal ideation, mood changes, confusion, nervousness, sleep disturbance and agitation   Assessment and Plan:  COVID-19  - Plan: molnupiravir EUA (LAGEVRIO) 200 mg CAPS capsule -She may also use OTC medications such as antihistamines,  pain relievers, guaifenesin. -We have reviewed quarantine period of 5 days. -We have discussed symptoms that would promote ED evaluation. -She knows to follow with [place] if symptoms fail to resolve.    I discussed the assessment and treatment plan with the patient. The patient was provided an opportunity to ask questions and all were answered. The patient agreed with the plan and demonstrated an understanding of the instructions.   The patient was advised to call back or seek an in-person evaluation if the symptoms worsen or if the condition fails to improve as anticipated.  I provided 14 minutes of non-face-to-face time during this encounter.   Lelon Frohlich, MD [facility] at [facility]

## 2021-06-18 ENCOUNTER — Encounter: Payer: Self-pay | Admitting: Internal Medicine

## 2021-06-28 DIAGNOSIS — N3289 Other specified disorders of bladder: Secondary | ICD-10-CM | POA: Diagnosis not present

## 2021-06-28 DIAGNOSIS — R310 Gross hematuria: Secondary | ICD-10-CM | POA: Diagnosis not present

## 2021-06-28 DIAGNOSIS — N2889 Other specified disorders of kidney and ureter: Secondary | ICD-10-CM | POA: Diagnosis not present

## 2021-06-28 DIAGNOSIS — K449 Diaphragmatic hernia without obstruction or gangrene: Secondary | ICD-10-CM | POA: Diagnosis not present

## 2021-06-28 DIAGNOSIS — K5730 Diverticulosis of large intestine without perforation or abscess without bleeding: Secondary | ICD-10-CM | POA: Diagnosis not present

## 2021-06-28 DIAGNOSIS — N200 Calculus of kidney: Secondary | ICD-10-CM | POA: Diagnosis not present

## 2021-07-03 ENCOUNTER — Other Ambulatory Visit: Payer: Self-pay | Admitting: Internal Medicine

## 2021-07-23 ENCOUNTER — Telehealth: Payer: Self-pay | Admitting: Internal Medicine

## 2021-07-23 DIAGNOSIS — N3941 Urge incontinence: Secondary | ICD-10-CM | POA: Diagnosis not present

## 2021-07-23 DIAGNOSIS — N200 Calculus of kidney: Secondary | ICD-10-CM | POA: Diagnosis not present

## 2021-07-23 DIAGNOSIS — R310 Gross hematuria: Secondary | ICD-10-CM | POA: Diagnosis not present

## 2021-07-23 DIAGNOSIS — K5900 Constipation, unspecified: Secondary | ICD-10-CM

## 2021-07-23 NOTE — Telephone Encounter (Signed)
Daughter call back and stated she want a [facility] referral.

## 2021-07-23 NOTE — Telephone Encounter (Signed)
Called daughter to see what type of referral is needed, no answer. Left message on vm informing we need to know what type of referral.

## 2021-07-23 NOTE — Telephone Encounter (Signed)
Pt daughter call and stated she want her mother to have a referral

## 2021-07-24 NOTE — Telephone Encounter (Signed)
Left detailed message on machine for daughter that the referral was placed and the patient should be taking miralax  twice daily. ?

## 2021-08-05 ENCOUNTER — Other Ambulatory Visit: Payer: Self-pay | Admitting: Internal Medicine

## 2021-08-13 ENCOUNTER — Other Ambulatory Visit: Payer: Self-pay | Admitting: Urology

## 2021-08-13 ENCOUNTER — Other Ambulatory Visit: Payer: Self-pay

## 2021-08-13 ENCOUNTER — Encounter (HOSPITAL_COMMUNITY): Payer: Self-pay | Admitting: Urology

## 2021-08-13 DIAGNOSIS — N202 Calculus of kidney with calculus of ureter: Secondary | ICD-10-CM | POA: Diagnosis not present

## 2021-08-13 NOTE — Progress Notes (Signed)
For Short Stay: ?[place] appointment date: N/A ?Date of COVID positive in last 28 days:N/A ? ?Bowel Prep reminder: N/A ? ? ?For Anesthesia: ?PCP - Erline Hau, MD last office visit 06/17/21 in epic ?Cardiologist - N/A ? ?Chest x-ray - N/A ?EKG - greater than 1 year in epic ?Stress Test - N/A ?ECHO - greater than 2 years in epic ?Cardiac Cath - N/A ?Pacemaker/ICD device last checked: N/A ?Pacemaker orders received: N/A ?Device Rep notified: N/A ? ?Spinal Cord Stimulator:N/A ? ?Sleep Study - Yes ?CPAP - N/A ? ?Fasting Blood Sugar - 140-160's ?Checks Blood Sugar __1___ times a day ?Date and result of last Hgb A1c- ? ?Blood Thinner Instructions: N/A ?Aspirin Instructions: Aspirin ?Last Dose: 08/12/21 ? ?Activity level:  activities of daily living without stopping and without chest pain and/or shortness of breath ?   ?Anesthesia review: Anesthesia complications, HTN, DM ? ?Patient denies shortness of breath, fever, cough and chest pain at PAT appointment ? ? ?Patient verbalized understanding of instructions that were given to them at the PAT appointment. Patient was also instructed that they will need to review over the PAT instructions again at home before surgery.  ?

## 2021-08-14 ENCOUNTER — Encounter (HOSPITAL_COMMUNITY)
Admission: RE | Disposition: A | Discharge status: Home / Self Care | Payer: Self-pay | Source: Home / Self Care | Attending: Urology

## 2021-08-14 ENCOUNTER — Ambulatory Visit (HOSPITAL_COMMUNITY): Payer: Medicare HMO

## 2021-08-14 ENCOUNTER — Ambulatory Visit (HOSPITAL_COMMUNITY)
Admission: RE | Admit: 2021-08-14 | Discharge: 2021-08-14 | Disposition: A | Discharge status: Home / Self Care | Payer: Medicare HMO | Attending: Urology | Admitting: Urology

## 2021-08-14 ENCOUNTER — Ambulatory Visit (HOSPITAL_COMMUNITY): Payer: Medicare HMO | Admitting: Physician Assistant

## 2021-08-14 ENCOUNTER — Ambulatory Visit (HOSPITAL_BASED_OUTPATIENT_CLINIC_OR_DEPARTMENT_OTHER): Payer: Medicare HMO | Admitting: Physician Assistant

## 2021-08-14 ENCOUNTER — Encounter (HOSPITAL_COMMUNITY): Payer: Self-pay | Admitting: Urology

## 2021-08-14 DIAGNOSIS — Z7984 Long term (current) use of oral hypoglycemic drugs: Secondary | ICD-10-CM | POA: Diagnosis not present

## 2021-08-14 DIAGNOSIS — I10 Essential (primary) hypertension: Secondary | ICD-10-CM | POA: Insufficient documentation

## 2021-08-14 DIAGNOSIS — Z87891 Personal history of nicotine dependence: Secondary | ICD-10-CM | POA: Diagnosis not present

## 2021-08-14 DIAGNOSIS — Z79899 Other long term (current) drug therapy: Secondary | ICD-10-CM | POA: Insufficient documentation

## 2021-08-14 DIAGNOSIS — E119 Type 2 diabetes mellitus without complications: Secondary | ICD-10-CM | POA: Insufficient documentation

## 2021-08-14 DIAGNOSIS — G4730 Sleep apnea, unspecified: Secondary | ICD-10-CM | POA: Diagnosis not present

## 2021-08-14 DIAGNOSIS — E1169 Type 2 diabetes mellitus with other specified complication: Secondary | ICD-10-CM

## 2021-08-14 DIAGNOSIS — N201 Calculus of ureter: Secondary | ICD-10-CM | POA: Diagnosis not present

## 2021-08-14 DIAGNOSIS — N132 Hydronephrosis with renal and ureteral calculous obstruction: Secondary | ICD-10-CM | POA: Diagnosis not present

## 2021-08-14 DIAGNOSIS — N135 Crossing vessel and stricture of ureter without hydronephrosis: Secondary | ICD-10-CM

## 2021-08-14 HISTORY — DX: Acute embolism and thrombosis of unspecified deep veins of unspecified lower extremity: I82.409

## 2021-08-14 HISTORY — DX: Gastro-esophageal reflux disease without esophagitis: K21.9

## 2021-08-14 HISTORY — DX: Personal history of urinary calculi: Z87.442

## 2021-08-14 HISTORY — PX: CYSTOSCOPY WITH STENT PLACEMENT: SHX5790

## 2021-08-14 HISTORY — DX: Unspecified osteoarthritis, unspecified site: M19.90

## 2021-08-14 HISTORY — DX: Sleep apnea, unspecified: G47.30

## 2021-08-14 HISTORY — DX: Personal history of transient ischemic attack (TIA), and cerebral infarction without residual deficits: Z86.73

## 2021-08-14 HISTORY — DX: Other complications of anesthesia, initial encounter: T88.59XA

## 2021-08-14 LAB — HEMOGLOBIN A1C
Hgb A1c MFr Bld: 6 % — ABNORMAL HIGH (ref 4.8–5.6)
Mean Plasma Glucose: 125.5 mg/dL

## 2021-08-14 LAB — BASIC METABOLIC PANEL
Anion gap: 10 (ref 5–15)
BUN: 29 mg/dL — ABNORMAL HIGH (ref 8–23)
CO2: 26 mmol/L (ref 22–32)
Calcium: 9.2 mg/dL (ref 8.9–10.3)
Chloride: 98 mmol/L (ref 98–111)
Creatinine, Ser: 1.91 mg/dL — ABNORMAL HIGH (ref 0.44–1.00)
GFR, Estimated: 24 mL/min — ABNORMAL LOW (ref >60)
Glucose, Bld: 157 mg/dL — ABNORMAL HIGH (ref 70–99)
Potassium: 3.6 mmol/L (ref 3.5–5.1)
Sodium: 134 mmol/L — ABNORMAL LOW (ref 135–145)

## 2021-08-14 LAB — CBC
HCT: 34.7 % — ABNORMAL LOW (ref 36.0–46.0)
Hemoglobin: 11.3 g/dL — ABNORMAL LOW (ref 12.0–15.0)
MCH: 29 pg (ref 26.0–34.0)
MCHC: 32.6 g/dL (ref 30.0–36.0)
MCV: 89.2 fL (ref 80.0–100.0)
Platelets: 254 10*3/uL (ref 150–400)
RBC: 3.89 MIL/uL (ref 3.87–5.11)
RDW: 14.5 % (ref 11.5–15.5)
WBC: 9.1 10*3/uL (ref 4.0–10.5)
nRBC: 0 % (ref 0.0–0.2)

## 2021-08-14 LAB — GLUCOSE, CAPILLARY: Glucose-Capillary: 151 mg/dL — ABNORMAL HIGH (ref 70–99)

## 2021-08-14 SURGERY — CYSTOSCOPY, WITH STENT INSERTION
Anesthesia: Monitor Anesthesia Care | Site: Renal | Laterality: Right

## 2021-08-14 MED ORDER — LIDOCAINE HCL (PF) 2 % IJ SOLN
INTRAMUSCULAR | Status: AC
  Filled 2021-08-14: qty 5

## 2021-08-14 MED ORDER — ONDANSETRON HCL 4 MG/2ML IJ SOLN
INTRAMUSCULAR | Status: DC | PRN
  Administered 2021-08-14: 4 mg via INTRAVENOUS

## 2021-08-14 MED ORDER — ONDANSETRON HCL 4 MG/2ML IJ SOLN
INTRAMUSCULAR | Status: AC
  Filled 2021-08-14: qty 2

## 2021-08-14 MED ORDER — PROPOFOL 10 MG/ML IV BOLUS
INTRAVENOUS | Status: DC | PRN
  Administered 2021-08-14 (×2): 20 mg via INTRAVENOUS

## 2021-08-14 MED ORDER — FENTANYL CITRATE (PF) 100 MCG/2ML IJ SOLN
INTRAMUSCULAR | Status: AC
  Filled 2021-08-14: qty 2

## 2021-08-14 MED ORDER — DEXAMETHASONE SODIUM PHOSPHATE 4 MG/ML IJ SOLN
INTRAMUSCULAR | Status: DC | PRN
  Administered 2021-08-14: 4 mg via INTRAVENOUS

## 2021-08-14 MED ORDER — FENTANYL CITRATE PF 50 MCG/ML IJ SOSY: 25.0000 ug | PREFILLED_SYRINGE | INTRAMUSCULAR | Status: DC | PRN

## 2021-08-14 MED ORDER — CHLORHEXIDINE GLUCONATE 0.12 % MT SOLN: 15.0000 mL | Freq: Once | OROMUCOSAL | Status: AC

## 2021-08-14 MED ORDER — PROPOFOL 500 MG/50ML IV EMUL
INTRAVENOUS | Status: AC
  Filled 2021-08-14: qty 50

## 2021-08-14 MED ORDER — FENTANYL CITRATE (PF) 100 MCG/2ML IJ SOLN
INTRAMUSCULAR | Status: DC | PRN
  Administered 2021-08-14 (×2): 25 ug via INTRAVENOUS

## 2021-08-14 MED ORDER — ACETAMINOPHEN 10 MG/ML IV SOLN: 1000.0000 mg | Freq: Once | INTRAVENOUS | Status: DC | PRN

## 2021-08-14 MED ORDER — PROPOFOL 500 MG/50ML IV EMUL
INTRAVENOUS | Status: DC | PRN
  Administered 2021-08-14: 100 ug/kg/min via INTRAVENOUS

## 2021-08-14 MED ORDER — CIPROFLOXACIN IN D5W 400 MG/200ML IV SOLN
400.0000 mg | INTRAVENOUS | Status: AC
  Administered 2021-08-14: 400 mg via INTRAVENOUS
  Filled 2021-08-14: qty 200

## 2021-08-14 MED ORDER — PROPOFOL 1000 MG/100ML IV EMUL
INTRAVENOUS | Status: AC
  Filled 2021-08-14: qty 100

## 2021-08-14 MED ORDER — ONDANSETRON HCL 4 MG/2ML IJ SOLN: 4.0000 mg | Freq: Once | INTRAMUSCULAR | Status: DC | PRN

## 2021-08-14 MED ORDER — AMISULPRIDE (ANTIEMETIC) 5 MG/2ML IV SOLN: 10.0000 mg | Freq: Once | INTRAVENOUS | Status: DC | PRN

## 2021-08-14 MED ORDER — DEXAMETHASONE SODIUM PHOSPHATE 10 MG/ML IJ SOLN
INTRAMUSCULAR | Status: AC
  Filled 2021-08-14: qty 1

## 2021-08-14 MED ORDER — LIDOCAINE 2% (20 MG/ML) 5 ML SYRINGE
INTRAMUSCULAR | Status: DC | PRN
Start: 2021-08-14 — End: 2021-08-14
  Administered 2021-08-14: 50 mg via INTRAVENOUS

## 2021-08-14 MED ORDER — LACTATED RINGERS IV SOLN: INTRAVENOUS | Status: DC

## 2021-08-14 MED ORDER — IOHEXOL 300 MG/ML  SOLN
INTRAMUSCULAR | Status: DC | PRN
  Administered 2021-08-14: 10 mL via URETHRAL

## 2021-08-14 MED ORDER — SODIUM CHLORIDE 0.9 % IR SOLN
Status: DC | PRN
  Administered 2021-08-14: 3000 mL via INTRAVESICAL

## 2021-08-14 MED ORDER — ORAL CARE MOUTH RINSE
15.0000 mL | Freq: Once | OROMUCOSAL | Status: AC
  Administered 2021-08-14: 15 mL via OROMUCOSAL

## 2021-08-14 SURGICAL SUPPLY — 14 items
BAG URO CATCHER STRL LF (MISCELLANEOUS) ×2 IMPLANT
CATH URETL OPEN 5X70 (CATHETERS) ×2 IMPLANT
CLOTH BEACON ORANGE TIMEOUT ST (SAFETY) ×2 IMPLANT
GLOVE SURG ENC TEXT LTX SZ7.5 (GLOVE) ×2 IMPLANT
GLOVE SURG POLYISO LF SZ7 (GLOVE) ×1 IMPLANT
GLOVE SURG UNDER POLY LF SZ7 (GLOVE) ×2 IMPLANT
GOWN STRL REUS W/ TWL XL LVL3 (GOWN DISPOSABLE) ×1 IMPLANT
GOWN STRL REUS W/TWL XL LVL3 (GOWN DISPOSABLE) ×2
GUIDEWIRE STR DUAL SENSOR (WIRE) ×2 IMPLANT
IV NS IRRIG 3000ML ARTHROMATIC (IV SOLUTION) ×1 IMPLANT
MANIFOLD NEPTUNE II (INSTRUMENTS) ×2 IMPLANT
PACK CYSTO (CUSTOM PROCEDURE TRAY) ×2 IMPLANT
STENT URET 6FRX24 CONTOUR (STENTS) ×1 IMPLANT
TUBING CONNECTING 10 (TUBING) ×1 IMPLANT

## 2021-08-14 NOTE — Transfer of Care (Signed)
Immediate Anesthesia Transfer of Care Note ? ?Patient: Stacey Armstrong ? ?Procedure(s) Performed: CYSTOSCOPY WITH STENT PLACEMENT (Right: Renal) ? ?Patient Location: PACU ? ?Anesthesia Type:MAC ? ?Level of Consciousness: awake and patient cooperative ? ?Airway & Oxygen Therapy: Patient Spontanous Breathing and Patient connected to face mask ? ?Post-op Assessment: Report given to RN and Post -op Vital signs reviewed and stable ? ?Post vital signs: Reviewed and stable ? ?Last Vitals:  ?Vitals Value Taken Time  ?BP    ?Temp    ?Pulse 59 08/14/21 0731  ?Resp 21 08/14/21 0731  ?SpO2 98 % 08/14/21 0731  ?Vitals shown include unvalidated device data. ? ?Last Pain:  ?Vitals:  ? 08/14/21 0546  ?TempSrc: Oral  ?PainSc:   ?   ? ?Patients Stated Pain Goal: 3 (08/13/21 1533) ? ?Complications: No notable events documented. ?

## 2021-08-14 NOTE — Anesthesia Postprocedure Evaluation (Signed)
Anesthesia Post Note ? ?Patient: Stacey Armstrong ? ?Procedure(s) Performed: CYSTOSCOPY WITH STENT PLACEMENT (Right: Renal) ? ?  ? ?Patient location during evaluation: PACU ?Anesthesia Type: MAC ?Level of consciousness: awake ?Pain management: pain level controlled ?Vital Signs Assessment: post-procedure vital signs reviewed and stable ?Respiratory status: spontaneous breathing, nonlabored ventilation, respiratory function stable and patient connected to nasal cannula oxygen ?Cardiovascular status: stable and blood pressure returned to baseline ?Postop Assessment: no apparent nausea or vomiting ?Anesthetic complications: no ? ? ?No notable events documented. ? ?Last Vitals:  ?Vitals:  ? 08/14/21 0745 08/14/21 0800  ?BP: 122/87 (!) 170/62  ?Pulse: (!) 57 (!) 57  ?Resp: 18 15  ?Temp:    ?SpO2: 100% 94%  ?  ?Last Pain:  ?Vitals:  ? 08/14/21 0800  ?TempSrc:   ?PainSc: 0-No pain  ? ? ?  ?  ?  ?  ?  ?  ? ?Kennis Buell P Nathanie Ottley ? ? ? ? ?

## 2021-08-14 NOTE — H&P (Signed)
88 year old female seen today on call as an urgent work in. The patient is complaining of right-sided flank pain. She describes her pain as 5 out of 5. She is having associated nausea and vomiting. She threw up once this morning. She was unable to sleep overnight because of the pain. She has not had as much gross hematuria. She denies any dysuria. She denies any fevers or chills.  ? ?The patient had a CT scan 6 weeks prior that demonstrated a large UPJ stone in the right kidney. There was some stranding associated renal pelvic caliectasis. The patient saw Dr. Bess Harvest for this and opted for conservative management. However, today the patient's symptoms have progressed and at this point she is no longer able to tolerate the pain.  ? ? ? ?  ?ALLERGIES: berry family ?Coconut ?penicillin ?  ? ?MEDICATIONS: Aspirin  ?Metformin Hcl 1,000 mg tablet  ?Amitriptyline Hcl 25 mg tablet  ?Amlodipine Besylate 5 mg tablet  ?Atorvastatin Calcium 20 mg tablet  ?Bisoprolol-Hydrochlorothiazide 2.5 mg-6.25 mg tablet  ?Centrum Silver Women  ?Colace  ?Ipratropium Bromide  ?Miralax  ?Mupirocin 2 % ointment  ?Omega-3 Fish Oil 1,000 mg (120 mg-180 mg) capsule  ?Probiotic  ?Vitamin D3  ?  ? ?GU PSH: Cystoscopy - 06/07/2021 ?Locm 300-399Mg /Ml Iodine,1Ml - 06/28/2021 ? ?  ? ?NON-GU PSH: Remove Gallbladder ? ?  ? ?GU PMH: Gross hematuria - 07/23/2021, - 06/28/2021, - 06/07/2021, - 05/07/2021 ?Renal calculus - 07/23/2021, - 06/28/2021 ?Urge incontinence - 07/23/2021, - 06/07/2021, - 05/07/2021 ?Urinary Frequency - 05/07/2021 ?  ? ?NON-GU PMH: Arthritis ?DVT, History ?Heartburn ?Hypercholesterolemia ?Hypertension ?Sleep Apnea ?Stroke/TIA ?  ? ?FAMILY HISTORY: 2 daughters - Daughter ?2 sons - Son  ? ?SOCIAL HISTORY: Marital Status: Widowed ?Current Smoking Status: Patient does not smoke anymore. Smoked for 40 years.  ? ?Tobacco Use Assessment Completed: Used Tobacco in last 30 days? ?Does not use smokeless tobacco. ?Has never drank.  ?Drinks 1 caffeinated drink  per day. ?Patient's occupation is/was Retired. ?  ? ?REVIEW OF SYSTEMS:    ?GU Review Female:   Patient denies frequent urination, hard to postpone urination, burning /pain with urination, get up at night to urinate, leakage of urine, stream starts and stops, trouble starting your stream, have to strain to urinate, and being pregnant.  ?Gastrointestinal (Upper):   Patient reports nausea. Patient denies indigestion/ heartburn and vomiting.  ?Gastrointestinal (Lower):   Patient denies diarrhea and constipation.  ?Constitutional:   Patient denies fever, night sweats, weight loss, and fatigue.  ?Skin:   Patient denies skin rash/ lesion and itching.  ?Eyes:   Patient denies blurred vision and double vision.  ?Ears/ Nose/ Throat:   Patient denies sore throat and sinus problems.  ?Hematologic/Lymphatic:   Patient denies swollen glands and easy bruising.  ?Cardiovascular:   Patient denies leg swelling and chest pains.  ?Respiratory:   Patient denies cough and shortness of breath.  ?Endocrine:   Patient denies excessive thirst.  ?Musculoskeletal:   Patient reports back pain. Patient denies joint pain.  ?Neurological:   Patient denies headaches and dizziness.  ?Psychologic:   Patient denies depression and anxiety.  ? ?Notes: Small amounts of urine output ?  ? ?VITAL SIGNS:    ?  08/13/2021 01:43 PM  ?BP 152/67 mmHg  ?Pulse 73 /min  ?Temperature 97.7 F / 36.5 C  ? ?MULTI-SYSTEM PHYSICAL EXAMINATION:    ?Constitutional: Well-nourished. No physical deformities. Normally developed. Good grooming.  ?Respiratory: Normal breath sounds. No labored breathing, no use of accessory  muscles.   ?Cardiovascular: Regular rate and rhythm. No murmur, no gallop. Normal temperature, normal extremity pulses, no swelling, no varicosities.   ? ?  ?Complexity of Data:  ?Source Of History:  Patient  ?Records Review:   Previous Doctor Records, Previous Patient Records, POC Tool  ?Urine Test Review:   Urinalysis  ?X-Ray Review: C.T. Abdomen/Pelvis:  Reviewed Films. Discussed With Patient.  ?  ? ?PROCEDURES:    ? ?     Urinalysis w/Scope - 81001 ?Dipstick Dipstick Cont'd Micro  ?Color: Straw Bilirubin: Neg WBC/hpf: 6 - 10/hpf  ?Appearance: Clear Ketones: Neg RBC/hpf: 10 - 20/hpf  ?Specific Gravity: 1.020 Blood: 3+ Bacteria: Rare (0-9/hpf)  ?pH: 7.0 Protein: 3+ Cystals: NS (Not Seen)  ?Glucose: Trace Urobilinogen: 0.2 Casts: NS (Not Seen)  ?  Nitrites: Neg Trichomonas: Not Present  ?  Leukocyte Esterase: 2+ Mucous: Not Present  ?    Epithelial Cells: 0 - 5/hpf  ?    Yeast: NS (Not Seen)  ?    Sperm: Not Present  ? ? ?Notes:  ?microscopic not concentrated ? ?  ? ? ?     Ketoralac $RemoveBe'30mg'[id number]$  - [id number], [id number] ?Qty: 30 Adm. By: Cristobal Goldmann  ?Unit: mg Lot No [id number]  ?Route: IM Exp. Date 03/26/2022  ?Freq: None Mfgr.:   ?Site: Left Buttock  ? ?ASSESSMENT:  ?    ICD-10 Details  ?1 GU:   Renal and ureteral calculus - N20.2   ? ?PLAN:    ? ? ?      Medications ?New Meds: Bactrim Ds 800 mg-160 mg tablet 1 tablet PO BID   #14  0 Refill(s)  ?Tramadol Hcl 50 mg tablet 1-2 tablet PO Q 6 H PRN   #20  0 Refill(s)  ?Pharmacy Name:  [id number]  ?Address:  [place].  ? [place], [place] [address]  ?Phone:  [phone]  ?Fax:  [phone]  ?  ? ? ?      Orders ?Labs Urine Culture  ? ? ?      Document ?Letter(s):  Created for Patient: Clinical Summary  ? ? ?     Notes:   The patient has right renal colic from an obstructing UPJ stone. She also has evidence of UTI.  ? ?I discussed the management strategies for her. She is planning to go to [place] in 2 weeks. I think the safest thing would be to put a stent in today and treat her for any infection. This will prevent her from having renal colic or obstructive symptoms in [place]. When she returns, she will have to decide whether she like to proceed with shockwave lithotripsy or ureteroscopy. The patient and her daughter are leaning toward shockwave lithotripsy because of the concern for general anesthesia given that she  was hypoxic after her gallbladder surgery and required ICU stay for 5 days.  ? ?I have sent the patient's urine for culture and started her on Bactrim twice daily. In addition, I am sending in some pain medication for her. She is being set up for urgent stent placement.  ? ?CC: Dr. Lelon Frohlich, MD  ?cc: Dr. Phebe Colla, MD  ? ?

## 2021-08-14 NOTE — Interval H&P Note (Signed)
History and Physical Interval Note: ? ?08/14/2021 ?7:00 AM ? ?Stacey Armstrong  has presented today for surgery, with the diagnosis of RIGHT OBSTRUCTIING STONE.  The various methods of treatment have been discussed with the patient and family. After consideration of risks, benefits and other options for treatment, the patient has consented to  Procedure(s) with comments: ?[place] (Right) - ONLY NEEDS 30 MIN as a surgical intervention.  The patient's history has been reviewed, patient examined, no change in status, stable for surgery.  I have reviewed the patient's chart and labs.  Questions were answered to the patient's satisfaction.   ? ? ?Ardis Hughs ? ? ?

## 2021-08-14 NOTE — Anesthesia Preprocedure Evaluation (Signed)
Anesthesia Evaluation  ?Patient identified by MRN, date of birth, ID band ?Patient awake ? ? ? ?Reviewed: ?Allergy & Precautions, NPO status , Patient's Chart, lab work & pertinent test results ? ?Airway ?Mallampati: III ? ?TM Distance: >3 FB ?Neck ROM: Full ? ? ? Dental ?no notable dental hx. ? ?  ?Pulmonary ?sleep apnea , former smoker,  ?  ?Pulmonary exam normal ?breath sounds clear to auscultation ? ? ? ? ? ? Cardiovascular ?hypertension, Pt. on medications and Pt. on home beta blockers ?Normal cardiovascular exam ?Rhythm:Regular Rate:Normal ? ? ?  ?Neuro/Psych ?negative neurological ROS ? negative psych ROS  ? GI/Hepatic ?negative GI ROS, Neg liver ROS,   ?Endo/Other  ?diabetes, Oral Hypoglycemic Agents ? Renal/GU ?Renal disease  ? ?  ?Musculoskeletal ? ?(+) Arthritis ,  ? Abdominal ?  ?Peds ? Hematology ?negative hematology ROS ?(+)   ?Anesthesia Other Findings ?RIGHT OBSTRUCTIING STONE ? Reproductive/Obstetrics ? ?  ? ? ? ? ? ? ? ? ? ? ? ? ? ?  ?  ? ? ? ? ? ? ? ? ?Anesthesia Physical ?Anesthesia Plan ? ?ASA: 3 ? ?Anesthesia Plan: MAC  ? ?Post-op Pain Management:   ? ?Induction: Intravenous ? ?PONV Risk Score and Plan: 2 and Ondansetron, Dexamethasone, Propofol infusion and Treatment may vary due to age or medical condition ? ?Airway Management Planned: Simple Face Mask ? ?Additional Equipment:  ? ?Intra-op Plan:  ? ?Post-operative Plan:  ? ?Informed Consent: I have reviewed the patients History and Physical, chart, labs and discussed the procedure including the risks, benefits and alternatives for the proposed anesthesia with the patient or authorized representative who has indicated his/her understanding and acceptance.  ? ? ? ?Dental advisory given and Consent reviewed with POA ? ?Plan Discussed with: CRNA ? ?Anesthesia Plan Comments: (Anesthetic plan discussed with patient and daughter)  ? ? ? ? ? ? ?Anesthesia Quick Evaluation ? ?

## 2021-08-14 NOTE — Discharge Instructions (Signed)
DISCHARGE INSTRUCTIONS FOR KIDNEY STONE/URETERAL STENT  ? ?MEDICATIONS:  ?1.  Resume all your other meds from home - except do not take any extra narcotic pain meds that you may have at home.  ?2. Tramadol is for moderate/severe pain, otherwise taking upto 1000 mg every 6 hours of plainTylenol will help treat your pain.   ? ? ?ACTIVITY:  ?1. No strenuous activity x 1week  ?2. No driving while on narcotic pain medications  ?3. Drink plenty of water  ?4. Continue to walk at home - you can still get blood clots when you are at home, so keep active, but don't over do it.  ?5. May return to work/school tomorrow or when you feel ready  ? ?BATHING:  ?1. You can shower and we recommend daily showers  ? ?SIGNS/SYMPTOMS TO CALL:  ?Please call us if you have a fever greater than 101.5, uncontrolled nausea/vomiting, uncontrolled pain, dizziness, unable to urinate, bloody urine, chest pain, shortness of breath, leg swelling, leg pain, redness around wound, drainage from wound, or any other concerns or questions.  ? ?You can reach [place] at [phone].  ? ?FOLLOW-UP:  ?1. You need to schedule f/u with Dr. Tresa Moore after returning from [facility] to decide how to treat the stone that is in your right kidney. ?

## 2021-08-14 NOTE — Op Note (Signed)
Preoperative diagnosis:  ?Right obstructing UPJ  ? ?Postoperative diagnosis:  ?same  ? ?Procedure: ? ?Cystoscopy ?right ureteral stent placement ?right retrograde pyelography with interpretation  ? ?Surgeon: Ardis Hughs, MD ? ?Anesthesia: General ? ?Complications: None ? ?Intraoperative findings:  Right retrograde pyelography demonstrated a filling defect within the right renal pelvis consistent with the patient?s known calculus without other abnormalities.  24cm x 80F right ureteral stent placed. ? ? ?EBL: Minimal ? ?Specimens: None ? ?Indication: Stacey Armstrong is a 88 y.o. patient with right flank pain, urine consistent with possible UTI and a large UPJ stone. After reviewing the management options for treatment, he elected to proceed with the above surgical procedure(s). We have discussed the potential benefits and risks of the procedure, side effects of the proposed treatment, the likelihood of the patient achieving the goals of the procedure, and any potential problems that might occur during the procedure or recuperation. Informed consent has been obtained. ? ?Description of procedure: ? ?The patient was taken to the operating room and general anesthesia was induced.  The patient was placed in the dorsal lithotomy position, prepped and draped in the usual sterile fashion, and preoperative antibiotics were administered. A preoperative time-out was performed.  ? ?Cystourethroscopy was performed.  The patient?s urethra was examined and was normal. The bladder was then systematically examined in its entirety. There was no evidence for any bladder tumors, stones, or other mucosal pathology.   ? ?Attention then turned to the rightureteral orifice and a ureteral catheter was used to intubate the ureteral orifice.  Omnipaque contrast was injected through the ureteral catheter and a retrograde pyelogram was performed with findings as dictated above. ? ?A 0.38 sensor guidewire was then advanced up the right  ureter into the renal pelvis under fluoroscopic guidance.  The wire was then backloaded through the cystoscope and a ureteral stent was advance over the wire using Seldinger technique.  The stent was positioned appropriately under fluoroscopic and cystoscopic guidance.  The wire was then removed with an adequate stent curl noted in the renal pelvis as well as in the bladder. ? ?The bladder was then emptied and the procedure ended.  The patient appeared to tolerate the procedure well and without complications.  The patient was able to be awakened and transferred to the recovery unit in satisfactory condition.  ? ? ?Ardis Hughs, M.D.  ?

## 2021-08-15 ENCOUNTER — Encounter (HOSPITAL_COMMUNITY): Payer: Self-pay | Admitting: Urology

## 2021-08-19 ENCOUNTER — Encounter (HOSPITAL_BASED_OUTPATIENT_CLINIC_OR_DEPARTMENT_OTHER): Payer: Self-pay

## 2021-08-19 ENCOUNTER — Other Ambulatory Visit: Payer: Self-pay

## 2021-08-19 ENCOUNTER — Telehealth: Payer: Self-pay | Admitting: Internal Medicine

## 2021-08-19 ENCOUNTER — Inpatient Hospital Stay (HOSPITAL_BASED_OUTPATIENT_CLINIC_OR_DEPARTMENT_OTHER)
Admission: EM | Admit: 2021-08-19 | Discharge: 2021-08-22 | DRG: 683 | Disposition: A | Discharge status: Home / Self Care | Payer: Medicare HMO | Attending: Family Medicine | Admitting: Family Medicine

## 2021-08-19 DIAGNOSIS — Z86718 Personal history of other venous thrombosis and embolism: Secondary | ICD-10-CM | POA: Diagnosis not present

## 2021-08-19 DIAGNOSIS — N179 Acute kidney failure, unspecified: Principal | ICD-10-CM | POA: Diagnosis present

## 2021-08-19 DIAGNOSIS — Z880 Allergy status to penicillin: Secondary | ICD-10-CM

## 2021-08-19 DIAGNOSIS — Z8673 Personal history of transient ischemic attack (TIA), and cerebral infarction without residual deficits: Secondary | ICD-10-CM | POA: Diagnosis not present

## 2021-08-19 DIAGNOSIS — G4730 Sleep apnea, unspecified: Secondary | ICD-10-CM | POA: Diagnosis present

## 2021-08-19 DIAGNOSIS — E1122 Type 2 diabetes mellitus with diabetic chronic kidney disease: Secondary | ICD-10-CM | POA: Diagnosis not present

## 2021-08-19 DIAGNOSIS — M1990 Unspecified osteoarthritis, unspecified site: Secondary | ICD-10-CM | POA: Diagnosis present

## 2021-08-19 DIAGNOSIS — E871 Hypo-osmolality and hyponatremia: Secondary | ICD-10-CM | POA: Diagnosis present

## 2021-08-19 DIAGNOSIS — H9190 Unspecified hearing loss, unspecified ear: Secondary | ICD-10-CM | POA: Diagnosis present

## 2021-08-19 DIAGNOSIS — Z91018 Allergy to other foods: Secondary | ICD-10-CM

## 2021-08-19 DIAGNOSIS — K5730 Diverticulosis of large intestine without perforation or abscess without bleeding: Secondary | ICD-10-CM | POA: Diagnosis not present

## 2021-08-19 DIAGNOSIS — Z8249 Family history of ischemic heart disease and other diseases of the circulatory system: Secondary | ICD-10-CM | POA: Diagnosis not present

## 2021-08-19 DIAGNOSIS — I700 Atherosclerosis of aorta: Secondary | ICD-10-CM | POA: Diagnosis not present

## 2021-08-19 DIAGNOSIS — Z7982 Long term (current) use of aspirin: Secondary | ICD-10-CM

## 2021-08-19 DIAGNOSIS — Z6827 Body mass index (BMI) 27.0-27.9, adult: Secondary | ICD-10-CM | POA: Diagnosis not present

## 2021-08-19 DIAGNOSIS — I10 Essential (primary) hypertension: Secondary | ICD-10-CM | POA: Diagnosis present

## 2021-08-19 DIAGNOSIS — N1832 Chronic kidney disease, stage 3b: Secondary | ICD-10-CM | POA: Diagnosis not present

## 2021-08-19 DIAGNOSIS — K219 Gastro-esophageal reflux disease without esophagitis: Secondary | ICD-10-CM | POA: Diagnosis present

## 2021-08-19 DIAGNOSIS — Z87891 Personal history of nicotine dependence: Secondary | ICD-10-CM

## 2021-08-19 DIAGNOSIS — E441 Mild protein-calorie malnutrition: Secondary | ICD-10-CM | POA: Diagnosis not present

## 2021-08-19 DIAGNOSIS — D631 Anemia in chronic kidney disease: Secondary | ICD-10-CM | POA: Diagnosis not present

## 2021-08-19 DIAGNOSIS — N189 Chronic kidney disease, unspecified: Secondary | ICD-10-CM | POA: Diagnosis present

## 2021-08-19 DIAGNOSIS — Z7984 Long term (current) use of oral hypoglycemic drugs: Secondary | ICD-10-CM

## 2021-08-19 DIAGNOSIS — Z20822 Contact with and (suspected) exposure to covid-19: Secondary | ICD-10-CM | POA: Diagnosis not present

## 2021-08-19 DIAGNOSIS — N209 Urinary calculus, unspecified: Secondary | ICD-10-CM | POA: Diagnosis present

## 2021-08-19 DIAGNOSIS — E1169 Type 2 diabetes mellitus with other specified complication: Secondary | ICD-10-CM | POA: Diagnosis present

## 2021-08-19 DIAGNOSIS — N184 Chronic kidney disease, stage 4 (severe): Secondary | ICD-10-CM | POA: Diagnosis present

## 2021-08-19 DIAGNOSIS — R112 Nausea with vomiting, unspecified: Secondary | ICD-10-CM | POA: Diagnosis not present

## 2021-08-19 DIAGNOSIS — K6389 Other specified diseases of intestine: Secondary | ICD-10-CM | POA: Diagnosis not present

## 2021-08-19 DIAGNOSIS — E785 Hyperlipidemia, unspecified: Secondary | ICD-10-CM | POA: Diagnosis present

## 2021-08-19 DIAGNOSIS — N390 Urinary tract infection, site not specified: Principal | ICD-10-CM

## 2021-08-19 DIAGNOSIS — Z8616 Personal history of COVID-19: Secondary | ICD-10-CM

## 2021-08-19 DIAGNOSIS — T368X5A Adverse effect of other systemic antibiotics, initial encounter: Secondary | ICD-10-CM | POA: Diagnosis present

## 2021-08-19 DIAGNOSIS — N200 Calculus of kidney: Secondary | ICD-10-CM | POA: Diagnosis present

## 2021-08-19 DIAGNOSIS — R110 Nausea: Secondary | ICD-10-CM | POA: Diagnosis present

## 2021-08-19 LAB — CBC
HCT: 33 % — ABNORMAL LOW (ref 36.0–46.0)
Hemoglobin: 10.6 g/dL — ABNORMAL LOW (ref 12.0–15.0)
MCH: 27.9 pg (ref 26.0–34.0)
MCHC: 32.1 g/dL (ref 30.0–36.0)
MCV: 86.8 fL (ref 80.0–100.0)
Platelets: 350 10*3/uL (ref 150–400)
RBC: 3.8 MIL/uL — ABNORMAL LOW (ref 3.87–5.11)
RDW: 14.3 % (ref 11.5–15.5)
WBC: 8.2 10*3/uL (ref 4.0–10.5)
nRBC: 0 % (ref 0.0–0.2)

## 2021-08-19 LAB — URINALYSIS, ROUTINE W REFLEX MICROSCOPIC
Bilirubin Urine: NEGATIVE
Glucose, UA: NEGATIVE mg/dL
Ketones, ur: NEGATIVE mg/dL
Nitrite: NEGATIVE
Protein, ur: 100 mg/dL — AB
RBC / HPF: >50 RBC/hpf — ABNORMAL HIGH (ref 0–5)
Specific Gravity, Urine: 1.017 (ref 1.005–1.030)
pH: 5.5 (ref 5.0–8.0)

## 2021-08-19 LAB — BASIC METABOLIC PANEL
Anion gap: 10 (ref 5–15)
BUN: 42 mg/dL — ABNORMAL HIGH (ref 8–23)
CO2: 23 mmol/L (ref 22–32)
Calcium: 9.6 mg/dL (ref 8.9–10.3)
Chloride: 98 mmol/L (ref 98–111)
Creatinine, Ser: 2.24 mg/dL — ABNORMAL HIGH (ref 0.44–1.00)
GFR, Estimated: 20 mL/min — ABNORMAL LOW (ref >60)
Glucose, Bld: 93 mg/dL (ref 70–99)
Potassium: 4.5 mmol/L (ref 3.5–5.1)
Sodium: 131 mmol/L — ABNORMAL LOW (ref 135–145)

## 2021-08-19 LAB — CBG MONITORING, ED: Glucose-Capillary: 87 mg/dL (ref 70–99)

## 2021-08-19 NOTE — ED Triage Notes (Addendum)
Pt arrives POV with her daughter. ? ?Pt has right ureteral stent placed on 08/14/21  due to kidney stone and was started on Bactrim BID. ? ?Daughter reports pt has had nausea, headaches and reflux since starting Bactrim. ? ?Daughter concerned pt is dehydrated as she has poor PO intake since starting antibiotic. ? ?Has not had bowel movement in 2 days, takes Miralax and Colace at home. ? ?Pt says she feels weak and lightheaded. ? ?Pt in wheelchair during triage in NAD. ? ? ?

## 2021-08-19 NOTE — Telephone Encounter (Signed)
Patient daughter called because patient is having weakness and fatigue, BP was 180/80. I let daughter know that I had no in office visits with PCP and offered another provider or virtual. Daughter declined and wanted callback instead ? ? ? ? ?Please advise  ?

## 2021-08-20 ENCOUNTER — Encounter (HOSPITAL_COMMUNITY): Payer: Self-pay | Admitting: Internal Medicine

## 2021-08-20 ENCOUNTER — Emergency Department (HOSPITAL_BASED_OUTPATIENT_CLINIC_OR_DEPARTMENT_OTHER): Payer: Medicare HMO

## 2021-08-20 DIAGNOSIS — N189 Chronic kidney disease, unspecified: Secondary | ICD-10-CM | POA: Diagnosis present

## 2021-08-20 DIAGNOSIS — Z20822 Contact with and (suspected) exposure to covid-19: Secondary | ICD-10-CM | POA: Diagnosis present

## 2021-08-20 DIAGNOSIS — K219 Gastro-esophageal reflux disease without esophagitis: Secondary | ICD-10-CM | POA: Diagnosis present

## 2021-08-20 DIAGNOSIS — M1990 Unspecified osteoarthritis, unspecified site: Secondary | ICD-10-CM | POA: Diagnosis present

## 2021-08-20 DIAGNOSIS — Z91018 Allergy to other foods: Secondary | ICD-10-CM | POA: Diagnosis not present

## 2021-08-20 DIAGNOSIS — H9190 Unspecified hearing loss, unspecified ear: Secondary | ICD-10-CM | POA: Diagnosis present

## 2021-08-20 DIAGNOSIS — E1122 Type 2 diabetes mellitus with diabetic chronic kidney disease: Secondary | ICD-10-CM | POA: Diagnosis present

## 2021-08-20 DIAGNOSIS — Z880 Allergy status to penicillin: Secondary | ICD-10-CM | POA: Diagnosis not present

## 2021-08-20 DIAGNOSIS — K5730 Diverticulosis of large intestine without perforation or abscess without bleeding: Secondary | ICD-10-CM | POA: Diagnosis present

## 2021-08-20 DIAGNOSIS — I700 Atherosclerosis of aorta: Secondary | ICD-10-CM | POA: Diagnosis present

## 2021-08-20 DIAGNOSIS — N200 Calculus of kidney: Secondary | ICD-10-CM | POA: Diagnosis present

## 2021-08-20 DIAGNOSIS — Z8673 Personal history of transient ischemic attack (TIA), and cerebral infarction without residual deficits: Secondary | ICD-10-CM | POA: Diagnosis not present

## 2021-08-20 DIAGNOSIS — T368X5A Adverse effect of other systemic antibiotics, initial encounter: Secondary | ICD-10-CM | POA: Diagnosis present

## 2021-08-20 DIAGNOSIS — N179 Acute kidney failure, unspecified: Secondary | ICD-10-CM | POA: Diagnosis present

## 2021-08-20 DIAGNOSIS — N184 Chronic kidney disease, stage 4 (severe): Secondary | ICD-10-CM | POA: Diagnosis present

## 2021-08-20 DIAGNOSIS — N209 Urinary calculus, unspecified: Secondary | ICD-10-CM | POA: Diagnosis present

## 2021-08-20 DIAGNOSIS — E871 Hypo-osmolality and hyponatremia: Secondary | ICD-10-CM | POA: Diagnosis present

## 2021-08-20 DIAGNOSIS — E1169 Type 2 diabetes mellitus with other specified complication: Secondary | ICD-10-CM | POA: Diagnosis not present

## 2021-08-20 DIAGNOSIS — E785 Hyperlipidemia, unspecified: Secondary | ICD-10-CM | POA: Diagnosis present

## 2021-08-20 DIAGNOSIS — Z8616 Personal history of COVID-19: Secondary | ICD-10-CM | POA: Diagnosis not present

## 2021-08-20 DIAGNOSIS — N390 Urinary tract infection, site not specified: Secondary | ICD-10-CM | POA: Diagnosis not present

## 2021-08-20 DIAGNOSIS — Z87891 Personal history of nicotine dependence: Secondary | ICD-10-CM | POA: Diagnosis not present

## 2021-08-20 DIAGNOSIS — Z86718 Personal history of other venous thrombosis and embolism: Secondary | ICD-10-CM | POA: Diagnosis not present

## 2021-08-20 DIAGNOSIS — E441 Mild protein-calorie malnutrition: Secondary | ICD-10-CM

## 2021-08-20 DIAGNOSIS — G4730 Sleep apnea, unspecified: Secondary | ICD-10-CM | POA: Diagnosis present

## 2021-08-20 DIAGNOSIS — N1832 Chronic kidney disease, stage 3b: Secondary | ICD-10-CM | POA: Diagnosis not present

## 2021-08-20 DIAGNOSIS — Z6827 Body mass index (BMI) 27.0-27.9, adult: Secondary | ICD-10-CM | POA: Diagnosis not present

## 2021-08-20 DIAGNOSIS — K6389 Other specified diseases of intestine: Secondary | ICD-10-CM | POA: Diagnosis not present

## 2021-08-20 DIAGNOSIS — Z8249 Family history of ischemic heart disease and other diseases of the circulatory system: Secondary | ICD-10-CM | POA: Diagnosis not present

## 2021-08-20 DIAGNOSIS — I10 Essential (primary) hypertension: Secondary | ICD-10-CM | POA: Diagnosis not present

## 2021-08-20 DIAGNOSIS — R110 Nausea: Secondary | ICD-10-CM | POA: Diagnosis present

## 2021-08-20 DIAGNOSIS — D631 Anemia in chronic kidney disease: Secondary | ICD-10-CM | POA: Diagnosis present

## 2021-08-20 LAB — GLUCOSE, CAPILLARY
Glucose-Capillary: 96 mg/dL (ref 70–99)
Glucose-Capillary: 93 mg/dL (ref 70–99)
Glucose-Capillary: 90 mg/dL (ref 70–99)
Glucose-Capillary: 117 mg/dL — ABNORMAL HIGH (ref 70–99)

## 2021-08-20 LAB — COMPREHENSIVE METABOLIC PANEL
ALT: 16 U/L (ref 0–44)
AST: 21 U/L (ref 15–41)
Albumin: 3 g/dL — ABNORMAL LOW (ref 3.5–5.0)
Alkaline Phosphatase: 53 U/L (ref 38–126)
Anion gap: 8 (ref 5–15)
BUN: 39 mg/dL — ABNORMAL HIGH (ref 8–23)
CO2: 23 mmol/L (ref 22–32)
Calcium: 8.5 mg/dL — ABNORMAL LOW (ref 8.9–10.3)
Chloride: 101 mmol/L (ref 98–111)
Creatinine, Ser: 1.76 mg/dL — ABNORMAL HIGH (ref 0.44–1.00)
GFR, Estimated: 27 mL/min — ABNORMAL LOW (ref >60)
Glucose, Bld: 96 mg/dL (ref 70–99)
Potassium: 4 mmol/L (ref 3.5–5.1)
Sodium: 132 mmol/L — ABNORMAL LOW (ref 135–145)
Total Bilirubin: 0.2 mg/dL — ABNORMAL LOW (ref 0.3–1.2)
Total Protein: 5.8 g/dL — ABNORMAL LOW (ref 6.5–8.1)

## 2021-08-20 LAB — CBC
HCT: 31.6 % — ABNORMAL LOW (ref 36.0–46.0)
Hemoglobin: 10.3 g/dL — ABNORMAL LOW (ref 12.0–15.0)
MCH: 28.7 pg (ref 26.0–34.0)
MCHC: 32.6 g/dL (ref 30.0–36.0)
MCV: 88 fL (ref 80.0–100.0)
Platelets: 330 10*3/uL (ref 150–400)
RBC: 3.59 MIL/uL — ABNORMAL LOW (ref 3.87–5.11)
RDW: 14.2 % (ref 11.5–15.5)
WBC: 8.3 10*3/uL (ref 4.0–10.5)
nRBC: 0 % (ref 0.0–0.2)

## 2021-08-20 LAB — PHOSPHORUS: Phosphorus: 3 mg/dL (ref 2.5–4.6)

## 2021-08-20 LAB — RESP PANEL BY RT-PCR (FLU A&B, COVID) ARPGX2
Influenza A by PCR: NEGATIVE
Influenza B by PCR: NEGATIVE
SARS Coronavirus 2 by RT PCR: NEGATIVE

## 2021-08-20 MED ORDER — ONDANSETRON HCL 4 MG PO TABS: 4.0000 mg | ORAL_TABLET | Freq: Four times a day (QID) | ORAL | Status: DC | PRN

## 2021-08-20 MED ORDER — BISOPROLOL FUMARATE 5 MG PO TABS
2.5000 mg | ORAL_TABLET | Freq: Every day | ORAL | Status: DC
Start: 2021-08-20 — End: 2021-08-20

## 2021-08-20 MED ORDER — ONDANSETRON HCL 4 MG/2ML IJ SOLN
4.0000 mg | Freq: Once | INTRAMUSCULAR | Status: AC
  Administered 2021-08-20: 4 mg via INTRAVENOUS
  Filled 2021-08-20: qty 2

## 2021-08-20 MED ORDER — ACETAMINOPHEN 325 MG PO TABS: 650.0000 mg | ORAL_TABLET | Freq: Four times a day (QID) | ORAL | Status: DC | PRN

## 2021-08-20 MED ORDER — SODIUM CHLORIDE 0.9 % IV BOLUS
1000.0000 mL | Freq: Once | INTRAVENOUS | Status: AC
  Administered 2021-08-20: 1000 mL via INTRAVENOUS

## 2021-08-20 MED ORDER — AMLODIPINE BESYLATE 5 MG PO TABS
5.0000 mg | ORAL_TABLET | Freq: Every day | ORAL | Status: DC
  Administered 2021-08-20 – 2021-08-22 (×3): 5 mg via ORAL
  Filled 2021-08-20 (×3): qty 1

## 2021-08-20 MED ORDER — BISOPROLOL FUMARATE 5 MG PO TABS
2.5000 mg | ORAL_TABLET | Freq: Every day | ORAL | Status: DC
  Administered 2021-08-20 – 2021-08-22 (×3): 2.5 mg via ORAL
  Filled 2021-08-20 (×3): qty 1

## 2021-08-20 MED ORDER — ACETAMINOPHEN 650 MG RE SUPP: 650.0000 mg | Freq: Four times a day (QID) | RECTAL | Status: DC | PRN

## 2021-08-20 MED ORDER — SODIUM CHLORIDE 0.9 % IV SOLN
1.0000 g | Freq: Once | INTRAVENOUS | Status: AC
  Administered 2021-08-20: 1 g via INTRAVENOUS
  Filled 2021-08-20: qty 10

## 2021-08-20 MED ORDER — POLYETHYL GLYCOL-PROPYL GLYCOL 0.4-0.3 % OP SOLN: 1.0000 [drp] | Freq: Three times a day (TID) | OPHTHALMIC | Status: DC | PRN

## 2021-08-20 MED ORDER — BISOPROLOL-HYDROCHLOROTHIAZIDE 2.5-6.25 MG PO TABS
1.0000 | ORAL_TABLET | Freq: Every day | ORAL | Status: DC
  Filled 2021-08-20: qty 1

## 2021-08-20 MED ORDER — DOCUSATE SODIUM 100 MG PO CAPS
100.0000 mg | ORAL_CAPSULE | Freq: Every day | ORAL | Status: DC
  Administered 2021-08-20 – 2021-08-22 (×3): 100 mg via ORAL
  Filled 2021-08-20 (×3): qty 1

## 2021-08-20 MED ORDER — POLYETHYLENE GLYCOL 3350 17 G PO PACK
17.0000 g | PACK | Freq: Every day | ORAL | Status: DC
  Administered 2021-08-20 – 2021-08-22 (×3): 17 g via ORAL
  Filled 2021-08-20 (×3): qty 1

## 2021-08-20 MED ORDER — POLYVINYL ALCOHOL 1.4 % OP SOLN: 1.0000 [drp] | Freq: Three times a day (TID) | OPHTHALMIC | Status: DC | PRN

## 2021-08-20 MED ORDER — IPRATROPIUM BROMIDE 0.03 % NA SOLN: 1.0000 | Freq: Three times a day (TID) | NASAL | Status: DC | PRN

## 2021-08-20 MED ORDER — SODIUM CHLORIDE 0.9 % IV SOLN
1.0000 g | INTRAVENOUS | Status: DC
  Administered 2021-08-20 – 2021-08-21 (×2): 1 g via INTRAVENOUS
  Filled 2021-08-20 (×2): qty 10

## 2021-08-20 MED ORDER — SODIUM CHLORIDE 0.9 % IV SOLN: INTRAVENOUS | Status: DC

## 2021-08-20 MED ORDER — AMITRIPTYLINE HCL 25 MG PO TABS
25.0000 mg | ORAL_TABLET | ORAL | Status: DC
  Administered 2021-08-20: 25 mg via ORAL
  Filled 2021-08-20: qty 1

## 2021-08-20 MED ORDER — ASPIRIN EC 81 MG PO TBEC
81.0000 mg | DELAYED_RELEASE_TABLET | Freq: Every day | ORAL | Status: DC
  Administered 2021-08-20 – 2021-08-22 (×3): 81 mg via ORAL
  Filled 2021-08-20 (×3): qty 1

## 2021-08-20 MED ORDER — ONDANSETRON HCL 4 MG/2ML IJ SOLN: 4.0000 mg | Freq: Four times a day (QID) | INTRAMUSCULAR | Status: DC | PRN

## 2021-08-20 MED ORDER — ATORVASTATIN CALCIUM 20 MG PO TABS
20.0000 mg | ORAL_TABLET | Freq: Every day | ORAL | Status: DC
  Administered 2021-08-20 – 2021-08-21 (×2): 20 mg via ORAL
  Filled 2021-08-20 (×2): qty 1

## 2021-08-20 NOTE — H&P (Signed)
?History and Physical  ? ? ?PatientMarland Kitchen Stacey Armstrong KNL:[id number] DOB: 06-16-1927 ?DOA: 08/19/2021 ?DOS: the patient was seen and examined on 08/20/2021 ?PCP: [name], Rayford Halsted, MD  ?Patient coming from: Home ? ?Chief Complaint:  ?Chief Complaint  ?Patient presents with  ? Nausea  ? ?HPI: Stacey Armstrong is a 89 y.o. female with medical history significant of osteoarthritis, type 2 diabetes, CKD stage 4, history of DVT after hysterectomy, GERD, history of COVID-19, history of TIA, moderate impaired hearing, hyperlipidemia, hypertension, sleep apnea, history of urolithiasis who underwent a cystoscopy on 08/14/2021 with right ureteral stent placement due to right obstructing UPJ urolith and discharged home on Bactrim.  However, patient's daughter brought her to the emergency department and reported that she has had headaches, weakness, nausea, vomiting, poor appetite and reflux since starting Bactrim.  She has also been constipated for the past 2 days.  No melena or hematochezia.  Positive hematuria, but no flank pain or dysuria at this time.  No fever, positive chills.  No dyspnea, chest pain, palpitations, orthopnea or recent pitting edema of the lower extremities.  No polyuria, polydipsia, polyphagia or blurred vision. ?  ?ED course: Initial vital signs were temperature 99 ?F, pulse 70, respiration 18, BP 148/60 mmHg O2 sat 100% on room air.  The patient received ceftriaxone 1 g IVPB, ondansetron 4 mg IVPB and 1000 mL of NS bolus. ? ?Lab work: Urinalysis was brown with a cloudy appearance large hemoglobinuria, proteinuria 100 mg/dL, moderate leukocyte esterase, more than 50 RBC and 6-10 WBC per hpf.  CBC with a white count of 8.2, hemoglobin 7.6 g/dL and platelets 350.  BMP showed a sodium of 131 mmol/L, BUN of 42 and creatinine of 2.24 mg/dL.  The rest of the electrolytes and glucose were normal.  Her creatinine 6 days ago was 1.91 mg/dL.  18 months ago was 0.96 mg/dL. ? ?Imaging: CT renal study done today  showed a 15 mm stone in the right renal pelvis with mild fullness of the right renal pelvis.  Right ureteral stent is in place.  There is no hydronephrosis.  There is colonic diverticulosis without diverticulitis.  No bowel obstruction.  There is mild haziness and stranding of the second portion of the duodenum which may be reactive or represent duodenitis.  Aortic atherosclerosis was also seen.  Please see images and full radiology report for further details. ? ?Review of Systems: As mentioned in the history of present illness. All other systems reviewed and are negative. ?Past Medical History:  ?Diagnosis Date  ? Arthritis   ? Complication of anesthesia   ? after gallbladder surgery with extubation had shallow respirations,  needed to continue intudation for additonal 10-12 days  ? Diabetes ([place])   ? DVT (deep venous thrombosis) ([place])   ? left leg after hysterectomy  ? GERD (gastroesophageal reflux disease)   ? History of COVID-19 05/2021  ? History of kidney stones   ? History of transient ischemic attack (TIA)   ? HOH (hard of hearing)   ? Hyperlipidemia   ? Hypertension   ? Sleep apnea   ? ?Past Surgical History:  ?Procedure Laterality Date  ? ABDOMINAL HYSTERECTOMY    ? APPENDECTOMY    ? CATARACT EXTRACTION, BILATERAL    ? CHOLECYSTECTOMY    ? COLONOSCOPY    ? CYSTOSCOPY WITH STENT PLACEMENT Right 08/14/2021  ? Procedure: CYSTOSCOPY WITH STENT PLACEMENT;  Surgeon: Ardis Hughs, MD;  Location: [facility];  Service: Urology;  Laterality: Right;  ONLY  NEEDS 30 MIN  ? ?Social History:  reports that she has quit smoking. She has never used smokeless tobacco. She reports that she does not currently use alcohol. She reports that she does not use drugs. ? ?Allergies  ?Allergen Reactions  ? Coconut (Cocos Nucifera) Allergy Skin Test Hives  ? Penicillins Other (See Comments)  ?  Unknown to pt daughter   ? Other Hives  ?  Berries   ? ? ?Family History  ?Problem Relation Age of Onset  ? CAD Maternal Grandmother   ?  Pneumonia Maternal Grandfather   ? ? ?Prior to Admission medications   ?Medication Sig Start Date End Date Taking? Authorizing Provider  ?amitriptyline (ELAVIL) 25 MG tablet TAKE 1 TABLET EVERY OTHER DAY ?Patient taking differently: Take 25 mg by mouth every other day. At bedtime 08/05/21  Yes [name], Rayford Halsted, MD  ?amLODipine (NORVASC) 5 MG tablet TAKE 1 TABLET EVERY DAY 02/06/21  Yes [name], Rayford Halsted, MD  ?aspirin EC 81 MG tablet Take 81 mg by mouth in the morning.   Yes [provider]  ?atorvastatin (LIPITOR) 20 MG tablet TAKE 1 TABLET EVERY DAY ?Patient taking differently: Take 20 mg by mouth at bedtime. 02/06/21  Yes [name], Rayford Halsted, MD  ?bisoprolol-hydrochlorothiazide [facility]) 2.5-6.25 MG tablet TAKE 1 TABLET EVERY DAY 02/06/21  Yes [name], Rayford Halsted, MD  ?Blood Glucose Monitoring Suppl (TRUE METRIX METER) DEVI 1 each by Does not apply route daily. 09/28/20  Yes [name], Rayford Halsted, MD  ?Cholecalciferol (VITAMIN D3) 50 MCG (2000 UT) TABS Take 2,000 Units by mouth in the morning.   Yes [provider]  ?[employer] MAGNESIUM CITRATE 1.745 GM/30ML SOLN TAKE 296 MLS (1 BOTTLE TOTAL) BY MOUTH ONCE FOR 1 DOSE. 01/30/21  Yes [name], Rayford Halsted, MD  ?docusate sodium (COLACE) 100 MG capsule Take 1 capsule (100 mg total) by mouth 2 (two) times daily. ?Patient taking differently: Take 100 mg by mouth in the morning. 12/30/18  Yes [name], Rayford Halsted, MD  ?glucose blood (TRUE METRIX BLOOD GLUCOSE TEST) test strip 1 each by Other route daily. Dx E11.9 10/03/20  Yes [name], Rayford Halsted, MD  ?ipratropium (ATROVENT) 0.03 % nasal spray Place 1 spray into both nostrils 3 (three) times daily as needed (congestion/allergies.). 01/22/21  Yes [provider]  ?metFORMIN (GLUCOPHAGE) 1000 MG tablet TAKE 1 TABLET TWICE DAILY WITH MEALS 02/06/21  Yes [name], Rayford Halsted, MD  ?Multiple Vitamins-Minerals (CENTRUM SILVER 50+WOMEN) TABS Take 1 tablet by  mouth in the morning.   Yes [provider]  ?Omega-3 Fatty Acids (FISH OIL) 1200 MG CAPS Take 1,000 mg by mouth 2 (two) times a day.   Yes [provider]  ?Polyethyl Glycol-Propyl Glycol (SYSTANE ULTRA) 0.4-0.3 % SOLN Place 1-2 drops into both eyes 3 (three) times daily as needed (dry/irritated eyes.).   Yes [provider]  ?polyethylene glycol powder (GLYCOLAX/MIRALAX) 17 GM/SCOOP powder Take 17 g by mouth 2 (two) times daily as needed. ?Patient taking differently: Take 17 g by mouth in the morning. 12/30/18  Yes [name], Rayford Halsted, MD  ?Probiotic Product (PROBIOTIC PO) Take 1 capsule by mouth in the morning.   Yes [provider]  ?sulfamethoxazole-trimethoprim (BACTRIM DS) 800-160 MG tablet Take 1 tablet by mouth 2 (two) times daily. 08/13/21  Yes [provider]  ?Alcohol Swabs (B-D SINGLE USE SWABS REGULAR) PADS USE AS DIRECTED EVERY DAY 09/11/20   [name], Rayford Halsted, MD  ?  mupirocin ointment (BACTROBAN) 2 % Apply 1 application. topically daily as needed (nose rawness.).    [provider]  ?TRUEplus Lancets 33G MISC TEST BLOOD SUGAR EVERY DAY 07/03/21   [name], Rayford Halsted, MD  ? ? ?Physical Exam: ?Vitals:  ? 08/20/21 0102 08/20/21 0300 08/20/21 0405 08/20/21 0503  ?BP: (!) 147/61 (!) 130/45 (!) 149/53 (!) 158/53  ?Pulse: 64 66 62 60  ?Resp: $Remov'18 18 18 18  'Rlefwz$ ?Temp:    98 ?F (36.7 ?C)  ?TempSrc:    Oral  ?SpO2: 100% 96% 96% 95%  ?Weight:      ?Height:      ? ?Physical Exam ?Vitals reviewed.  ?HENT:  ?   Head: Normocephalic.  ?   Mouth/Throat:  ?   Mouth: Mucous membranes are dry.  ?Eyes:  ?   Pupils: Pupils are equal, round, and reactive to light.  ?Neck:  ?   Vascular: No JVD.  ?Cardiovascular:  ?   Rate and Rhythm: Normal rate and regular rhythm.  ?   Heart sounds: S1 normal and S2 normal.  ?Pulmonary:  ?   Effort: Pulmonary effort is normal.  ?   Breath sounds: Normal breath sounds. No wheezing, rhonchi or rales.  ?Abdominal:  ?   General:  There is no distension.  ?   Palpations: Abdomen is soft.  ?   Tenderness: There is no abdominal tenderness. There is no guarding.  ?Musculoskeletal:  ?   Cervical back: Neck supple. Tenderness present.  ?   [place]

## 2021-08-20 NOTE — Progress Notes (Signed)
Bedside report given to RN.  Patient resting in bed, denies pain, N/V/D. ? ?Angie Fava, RN  ?

## 2021-08-20 NOTE — ED Notes (Signed)
[facility] arrived to transport pt. Pt stable at time of departure ?

## 2021-08-20 NOTE — Progress Notes (Signed)
End of shift  ? ?Pt A&Ox4.  Daughter in the room.  Pt ambulated in the room and in the hallway with staff as contact guard.  Pt has no complaints of pain. ? ?Anticipate pt discharging back to her independent living without any needs. ?

## 2021-08-20 NOTE — Telephone Encounter (Signed)
Fyi ? ?Spoke with Nevin Bloodgood and the patient is currently admitted to the hospital. ?

## 2021-08-21 DIAGNOSIS — E785 Hyperlipidemia, unspecified: Secondary | ICD-10-CM | POA: Diagnosis not present

## 2021-08-21 DIAGNOSIS — I10 Essential (primary) hypertension: Secondary | ICD-10-CM | POA: Diagnosis not present

## 2021-08-21 DIAGNOSIS — E1169 Type 2 diabetes mellitus with other specified complication: Secondary | ICD-10-CM

## 2021-08-21 DIAGNOSIS — E871 Hypo-osmolality and hyponatremia: Secondary | ICD-10-CM

## 2021-08-21 DIAGNOSIS — N179 Acute kidney failure, unspecified: Secondary | ICD-10-CM | POA: Diagnosis not present

## 2021-08-21 LAB — CBC
HCT: 30.2 % — ABNORMAL LOW (ref 36.0–46.0)
Hemoglobin: 9.9 g/dL — ABNORMAL LOW (ref 12.0–15.0)
MCH: 29.2 pg (ref 26.0–34.0)
MCHC: 32.8 g/dL (ref 30.0–36.0)
MCV: 89.1 fL (ref 80.0–100.0)
Platelets: 317 10*3/uL (ref 150–400)
RBC: 3.39 MIL/uL — ABNORMAL LOW (ref 3.87–5.11)
RDW: 14.3 % (ref 11.5–15.5)
WBC: 7.9 10*3/uL (ref 4.0–10.5)
nRBC: 0 % (ref 0.0–0.2)

## 2021-08-21 LAB — RENAL FUNCTION PANEL
Albumin: 2.7 g/dL — ABNORMAL LOW (ref 3.5–5.0)
Anion gap: 5 (ref 5–15)
BUN: 36 mg/dL — ABNORMAL HIGH (ref 8–23)
CO2: 23 mmol/L (ref 22–32)
Calcium: 8.5 mg/dL — ABNORMAL LOW (ref 8.9–10.3)
Chloride: 104 mmol/L (ref 98–111)
Creatinine, Ser: 1.88 mg/dL — ABNORMAL HIGH (ref 0.44–1.00)
GFR, Estimated: 25 mL/min — ABNORMAL LOW (ref >60)
Glucose, Bld: 99 mg/dL (ref 70–99)
Phosphorus: 3.1 mg/dL (ref 2.5–4.6)
Potassium: 4.1 mmol/L (ref 3.5–5.1)
Sodium: 132 mmol/L — ABNORMAL LOW (ref 135–145)

## 2021-08-21 LAB — GLUCOSE, CAPILLARY
Glucose-Capillary: 102 mg/dL — ABNORMAL HIGH (ref 70–99)
Glucose-Capillary: 104 mg/dL — ABNORMAL HIGH (ref 70–99)
Glucose-Capillary: 96 mg/dL (ref 70–99)
Glucose-Capillary: 107 mg/dL — ABNORMAL HIGH (ref 70–99)

## 2021-08-21 MED ORDER — AMLODIPINE BESYLATE 5 MG PO TABS
5.0000 mg | ORAL_TABLET | Freq: Once | ORAL | Status: AC
  Administered 2021-08-21: 5 mg via ORAL
  Filled 2021-08-21: qty 1

## 2021-08-21 MED ORDER — HYDRALAZINE HCL 25 MG PO TABS
25.0000 mg | ORAL_TABLET | Freq: Four times a day (QID) | ORAL | Status: DC | PRN
  Administered 2021-08-21 – 2021-08-22 (×2): 25 mg via ORAL
  Filled 2021-08-21 (×2): qty 1

## 2021-08-21 NOTE — Progress Notes (Signed)
Notified on call provider about the patient's elevated Blood Pressures: 153/48, 184/54, 177/55. On call provider did not give any new orders. ?

## 2021-08-21 NOTE — ED Provider Notes (Signed)
?[place] DEPT ?[facility] Emergency Department ?Provider Note ?MRN:  [id number]  ?Arrival date & time: 08/21/21    ? ?Chief Complaint   ?Nausea ?  ?History of Present Illness   ?Stacey Armstrong is a 88 y.o. year-old female with a history of hypertension, kidney stones presenting to the ED with chief complaint of nausea. ? ?Nausea, poor p.o. intake, chills for the past several days.  Recent stent placed for kidney stone. ? ?Review of Systems  ?A thorough review of systems was obtained and all systems are negative except as noted in the HPI and PMH.  ? ?Patient's Health History   ? ?Past Medical History:  ?Diagnosis Date  ? Arthritis   ? Complication of anesthesia   ? after gallbladder surgery with extubation had shallow respirations,  needed to continue intudation for additonal 10-12 days  ? Diabetes ([place])   ? DVT (deep venous thrombosis) ([place])   ? left leg after hysterectomy  ? GERD (gastroesophageal reflux disease)   ? History of COVID-19 05/2021  ? History of kidney stones   ? History of transient ischemic attack (TIA)   ? HOH (hard of hearing)   ? Hyperlipidemia   ? Hypertension   ? Sleep apnea   ?  ?Past Surgical History:  ?Procedure Laterality Date  ? ABDOMINAL HYSTERECTOMY    ? APPENDECTOMY    ? CATARACT EXTRACTION, BILATERAL    ? CHOLECYSTECTOMY    ? COLONOSCOPY    ? CYSTOSCOPY WITH STENT PLACEMENT Right 08/14/2021  ? Procedure: CYSTOSCOPY WITH STENT PLACEMENT;  Surgeon: Ardis Hughs, MD;  Location: [facility];  Service: Urology;  Laterality: Right;  ONLY NEEDS 30 MIN  ?  ?Family History  ?Problem Relation Age of Onset  ? CAD Maternal Grandmother   ? Pneumonia Maternal Grandfather   ?  ?Social History  ? ?Socioeconomic History  ? Marital status: Single  ?  Spouse name: Not on file  ? Number of children: Not on file  ? Years of education: Not on file  ? Highest education level: Not on file  ?Occupational History  ? Not on file  ?Tobacco Use  ? Smoking status: Former  ? Smokeless tobacco: Never   ?Vaping Use  ? Vaping Use: Never used  ?Substance and Sexual Activity  ? Alcohol use: Not Currently  ? Drug use: Never  ? Sexual activity: Not on file  ?Other Topics Concern  ? Not on file  ?Social History Narrative  ? Not on file  ? ?Social Determinants of Health  ? ?Financial Resource Strain: Not on file  ?Food Insecurity: Not on file  ?Transportation Needs: Not on file  ?Physical Activity: Not on file  ?Stress: Not on file  ?Social Connections: Not on file  ?Intimate Partner Violence: Not on file  ?  ? ?Physical Exam  ? ?Vitals:  ? 08/21/21 0435 08/21/21 0449  ?BP: (!) 184/54 (!) 177/55  ?Pulse: 61   ?Resp: 18   ?Temp: (!) 97.4 ?F (36.3 ?C)   ?SpO2: 98%   ?  ?CONSTITUTIONAL: Well-appearing, NAD ?NEURO/PSYCH:  Alert and oriented x 3, no focal deficits ?EYES:  eyes equal and reactive ?ENT/NECK:  no LAD, no JVD ?CARDIO: Regular rate, well-perfused, normal S1 and S2 ?PULM:  CTAB no wheezing or rhonchi ?GI/GU:  non-distended, non-tender ?MSK/SPINE:  No gross deformities, no edema ?SKIN:  no rash, atraumatic ? ? ?*Additional and/or pertinent findings included in MDM below ? ?Diagnostic and Interventional Summary  ? ? EKG Interpretation ? ?Date/Time:    ?  Ventricular Rate:    ?PR Interval:    ?QRS Duration:   ?QT Interval:    ?QTC Calculation:   ?R Axis:     ?Text Interpretation:   ?  ? ?  ? ?Labs Reviewed  ?URINALYSIS, ROUTINE W REFLEX MICROSCOPIC - Abnormal; Notable for the following components:  ?    Result Value  ? Color, Urine BROWN (*)   ? APPearance CLOUDY (*)   ? Hgb urine dipstick LARGE (*)   ? Protein, ur 100 (*)   ? Leukocytes,Ua MODERATE (*)   ? RBC / HPF >50 (*)   ? All other components within normal limits  ?CBC - Abnormal; Notable for the following components:  ? RBC 3.80 (*)   ? Hemoglobin 10.6 (*)   ? HCT 33.0 (*)   ? All other components within normal limits  ?BASIC METABOLIC PANEL - Abnormal; Notable for the following components:  ? Sodium 131 (*)   ? BUN 42 (*)   ? Creatinine, Ser 2.24 (*)   ? GFR,  Estimated 20 (*)   ? All other components within normal limits  ?COMPREHENSIVE METABOLIC PANEL - Abnormal; Notable for the following components:  ? Sodium 132 (*)   ? BUN 39 (*)   ? Creatinine, Ser 1.76 (*)   ? Calcium 8.5 (*)   ? Total Protein 5.8 (*)   ? Albumin 3.0 (*)   ? Total Bilirubin 0.2 (*)   ? GFR, Estimated 27 (*)   ? All other components within normal limits  ?CBC - Abnormal; Notable for the following components:  ? RBC 3.59 (*)   ? Hemoglobin 10.3 (*)   ? HCT 31.6 (*)   ? All other components within normal limits  ?CBC - Abnormal; Notable for the following components:  ? RBC 3.39 (*)   ? Hemoglobin 9.9 (*)   ? HCT 30.2 (*)   ? All other components within normal limits  ?RENAL FUNCTION PANEL - Abnormal; Notable for the following components:  ? Sodium 132 (*)   ? BUN 36 (*)   ? Creatinine, Ser 1.88 (*)   ? Calcium 8.5 (*)   ? Albumin 2.7 (*)   ? GFR, Estimated 25 (*)   ? All other components within normal limits  ?GLUCOSE, CAPILLARY - Abnormal; Notable for the following components:  ? Glucose-Capillary 117 (*)   ? All other components within normal limits  ?RESP PANEL BY RT-PCR (FLU A&B, COVID) ARPGX2  ?PHOSPHORUS  ?GLUCOSE, CAPILLARY  ?GLUCOSE, CAPILLARY  ?GLUCOSE, CAPILLARY  ?CBG MONITORING, ED  ?  ?CT Renal Stone Study  ?Final Result  ?  ?  ?Medications  ?cefTRIAXone (ROCEPHIN) 1 g in sodium chloride 0.9 % 100 mL IVPB (1 g Intravenous New Bag/Given 08/20/21 2108)  ?acetaminophen (TYLENOL) tablet 650 mg (has no administration in time range)  ?  Or  ?acetaminophen (TYLENOL) suppository 650 mg (has no administration in time range)  ?ondansetron (ZOFRAN) tablet 4 mg (has no administration in time range)  ?  Or  ?ondansetron (ZOFRAN) injection 4 mg (has no administration in time range)  ?0.9 %  sodium chloride infusion ( Intravenous New Bag/Given 08/21/21 0240)  ?polyethylene glycol (MIRALAX / GLYCOLAX) packet 17 g (17 g Oral Given 08/20/21 1419)  ?ipratropium (ATROVENT) 0.03 % nasal spray 1 spray (has no  administration in time range)  ?docusate sodium (COLACE) capsule 100 mg (100 mg Oral Given 08/20/21 1422)  ?atorvastatin (LIPITOR) tablet 20 mg (20 mg Oral Given 08/20/21 2105)  ?  aspirin EC tablet 81 mg (81 mg Oral Given 08/20/21 1421)  ?amLODipine (NORVASC) tablet 5 mg (5 mg Oral Given 08/20/21 1421)  ?amitriptyline (ELAVIL) tablet 25 mg (25 mg Oral Given 08/20/21 2104)  ?polyvinyl alcohol (LIQUIFILM TEARS) 1.4 % ophthalmic solution 1-2 drop (has no administration in time range)  ?bisoprolol (ZEBETA) tablet 2.5 mg (2.5 mg Oral Given 08/20/21 1421)  ?sodium chloride 0.9 % bolus 1,000 mL (0 mLs Intravenous Stopped 08/20/21 0322)  ?cefTRIAXone (ROCEPHIN) 1 g in sodium chloride 0.9 % 100 mL IVPB (0 g Intravenous Stopped 08/20/21 0158)  ?ondansetron [facility]) injection 4 mg (4 mg Intravenous Given 08/20/21 0058)  ?  ? ?Procedures  /  Critical Care ?Procedures ? ?ED Course and Medical Decision Making  ?Initial Impression and Ddx ?Differential diagnosis includes urinary sepsis, electrolyte disturbance, dehydration ? ?Past medical/surgical history that increases complexity of ED encounter: Kidney stone status post stent placement ? ?Interpretation of Diagnostics ?Labs reveal acute kidney injury, urinalysis consistent with infection. ? ?Patient Reassessment and Ultimate Disposition/Management ?Admission to hospital service.  Case discussed with urology who will follow. ? ?Patient management required discussion with the following services or consulting groups:  Hospitalist Service and Urology ? ?Complexity of Problems Addressed ?Acute illness or injury that poses threat of life of bodily function ? ?Additional Data Reviewed and Analyzed ?Further history obtained from: ?Further history from spouse/family member ? ?Additional Factors Impacting ED Encounter Risk ?Consideration of hospitalization ? ?Barth Kirks. Sedonia Small, MD ?[facility] Emergency Medicine ?[email or web address] ? ?Final Clinical Impressions(s) / ED  Diagnoses  ? ?  ICD-10-CM   ?1. Lower urinary tract infectious disease  N39.0   ?  ?2. AKI (acute kidney injury) ([place])  N17.9   ?  ?  ?ED Discharge Orders   ? ? None  ? ?  ?  ? ?Discharge Instructions [place]

## 2021-08-21 NOTE — Plan of Care (Signed)

## 2021-08-21 NOTE — Progress Notes (Signed)
[facility] ? ?PROGRESS NOTE ? ?Ara PZWCHE NID:[id number] DOB: [birth year] DOA: 08/19/2021 ?PCP: [name], Rayford Halsted, MD ? ? ?Brief HPI:   ?88 year old female with a history of osteoarthritis, diabetes mellitus type 2, CKD stage IV, history of DVT after hysterectomy, GERD, history of COVID-19, history of TIA, moderately impaired hearing, hyperlipidemia, hypertension, sleep apnea, history of urolithiasis who underwent cystoscopy on 08/14/2021 with right ureteral stent placement due to right obstructing UPJ urolith and was discharged home on Bactrim.  Patient's daughter who brought her back to the ED with reports of headache, weakness, nausea, vomiting, poor appetite since starting Bactrim.  Also patient has been constipated for past 2 days.  Denies melena or hematochezia.  Positive hematuria but no flank pain or dysuria. ?Urinalysis was brown with a cloudy appearance large hemoglobinuria, proteinuria 100 mg/dL, moderate leukocyte esterase, more than 50 RBC and 6-10 WBC per hpf ?CT renal study showed 15 mm stone in the right renal pelvis with mild fullness of the right renal pelvis.  Right ureteral stent is in place.  No hydronephrosis.  There is colonic diverticulosis without diverticulitis.  No bowel obstruction. ? ? ?Subjective  ? ?Patient seen and examined, denies abdominal pain. ? ? Assessment/Plan:  ? ? ?Acute kidney injury ?-Patient's baseline creatinine 1.91 as of 08/14/2021 ?-Presented with creatinine of 2.24, improved to 1.88 after IV fluids ?-Follow BMP in am ?- HCTZ on hold ? ?Hyponatremia ?-Mild, likely from GI losses ?-HCTZ on hold ? ?Urolithiasis ?-Bactrim discontinued ?-Started on ceftriaxone 1 g IV daily ?-Follow-up with urology as scheduled ? ?Diabetes mellitus type 2 ?-Metformin on hold due to acute kidney injury ?-Continue sliding scale insulin NovoLog ? ?Hypertension ?-HCTZ on hold due to hyponatremia ?-Continue bisoprolol, amlodipine ? ?Anemia of chronic disease ?-Hemoglobin  stable ? ?Mild protein malnutrition ?-Protein supplementation ? ? ? ? ? ?Medications ? ?  ? amitriptyline  25 mg Oral Q48H  ? amLODipine  5 mg Oral Daily  ? aspirin EC  81 mg Oral Daily  ? atorvastatin  20 mg Oral QHS  ? bisoprolol  2.5 mg Oral Daily  ? docusate sodium  100 mg Oral Daily  ? polyethylene glycol  17 g Oral Daily  ? ? ? Data Reviewed:  ? ?CBG: ? ?Recent Labs  ?Lab 08/20/21 ?1734 08/20/21 ?2045 08/21/21 ?1443 08/21/21 ?1210 08/21/21 ?1634  ?GLUCAP 90 117* 102* 104* 96  ? ? ?SpO2: 99 %  ? ? ?Vitals:  ? 08/21/21 1214 08/21/21 1333 08/21/21 1336 08/21/21 1419  ?BP: (!) 197/51 (!) 189/57 (!) 190/57 (!) 136/46  ?Pulse: (!) 57 66 64 (!) 59  ?Resp: 16     ?Temp: 97.9 ?F (36.6 ?C)     ?TempSrc: Oral     ?SpO2: 99%     ?Weight:      ?Height:      ? ? ? ? ?Data Reviewed: ? ?Basic Metabolic Panel: ?Recent Labs  ?Lab 08/19/21 ?2024 08/20/21 ?0755 08/21/21 ?0351  ?NA 131* 132* 132*  ?K 4.5 4.0 4.1  ?CL 98 101 104  ?CO2 $Rem'23 23 23  'vRry$ ?GLUCOSE 93 96 99  ?BUN 42* 39* 36*  ?CREATININE 2.24* 1.76* 1.88*  ?CALCIUM 9.6 8.5* 8.5*  ?PHOS  --  3.0 3.1  ? ? ?CBC: ?Recent Labs  ?Lab 08/19/21 ?2024 08/20/21 ?0755 08/21/21 ?0351  ?WBC 8.2 8.3 7.9  ?HGB 10.6* 10.3* 9.9*  ?HCT 33.0* 31.6* 30.2*  ?MCV 86.8 88.0 89.1  ?PLT 350 330 317  ? ? ?LFT ?Recent Labs  ?  Lab 08/20/21 ?7366 08/21/21 ?0351  ?AST 21  --   ?ALT 16  --   ?ALKPHOS 53  --   ?BILITOT 0.2*  --   ?PROT 5.8*  --   ?ALBUMIN 3.0* 2.7*  ? ?  ?Antibiotics: ?Anti-infectives (From admission, onward)  ? ? Start     Dose/Rate Route Frequency Ordered Stop  ? 08/20/21 2200  cefTRIAXone (ROCEPHIN) 1 g in sodium chloride 0.9 % 100 mL IVPB       ? 1 g ?200 mL/hr over 30 Minutes Intravenous Every 24 hours 08/20/21 0731    ? 08/20/21 0045  cefTRIAXone (ROCEPHIN) 1 g in sodium chloride 0.9 % 100 mL IVPB       ? 1 g ?200 mL/hr over 30 Minutes Intravenous  Once 08/20/21 0037 08/20/21 0158  ? ?  ? ? ? ?DVT prophylaxis: SCDs ? ?Code Status: Full code ? ?Family Communication: No family at  bedside ? ? ?CONSULTS  ? ? ?Objective  ? ? ?Physical Examination: ? ? ?General-appears in no acute distress ?Heart-S1-S2, regular, no murmur auscultated ?Lungs-clear to auscultation bilaterally, no wheezing or crackles auscultated ?Abdomen-soft, nontender, no organomegaly ?Extremities-no edema in the lower extremities ?Neuro-alert, oriented x3, no focal deficit noted ? ?Status is: Inpatient:   ? ? ? ?  ? ? ? ? ? ?Oswald Hillock ?  ?[facility] ?If 7PM-7AM, please contact night-coverage at [email or web address], ?Office  [phone] ? ? ?08/21/2021, 6:05 PM  LOS: 1 day  ? ? ? ? ? ? ? ? ? ? ?  ?

## 2021-08-22 ENCOUNTER — Telehealth: Payer: Self-pay | Admitting: Internal Medicine

## 2021-08-22 DIAGNOSIS — N390 Urinary tract infection, site not specified: Secondary | ICD-10-CM

## 2021-08-22 DIAGNOSIS — I10 Essential (primary) hypertension: Secondary | ICD-10-CM | POA: Diagnosis not present

## 2021-08-22 DIAGNOSIS — N179 Acute kidney failure, unspecified: Secondary | ICD-10-CM | POA: Diagnosis not present

## 2021-08-22 DIAGNOSIS — N1832 Chronic kidney disease, stage 3b: Secondary | ICD-10-CM | POA: Diagnosis not present

## 2021-08-22 DIAGNOSIS — D631 Anemia in chronic kidney disease: Secondary | ICD-10-CM

## 2021-08-22 LAB — RENAL FUNCTION PANEL
Albumin: 2.6 g/dL — ABNORMAL LOW (ref 3.5–5.0)
Anion gap: 8 (ref 5–15)
BUN: 32 mg/dL — ABNORMAL HIGH (ref 8–23)
CO2: 20 mmol/L — ABNORMAL LOW (ref 22–32)
Calcium: 8.2 mg/dL — ABNORMAL LOW (ref 8.9–10.3)
Chloride: 107 mmol/L (ref 98–111)
Creatinine, Ser: 1.66 mg/dL — ABNORMAL HIGH (ref 0.44–1.00)
GFR, Estimated: 29 mL/min — ABNORMAL LOW (ref >60)
Glucose, Bld: 95 mg/dL (ref 70–99)
Phosphorus: 3 mg/dL (ref 2.5–4.6)
Potassium: 3.6 mmol/L (ref 3.5–5.1)
Sodium: 135 mmol/L (ref 135–145)

## 2021-08-22 LAB — GLUCOSE, CAPILLARY
Glucose-Capillary: 89 mg/dL (ref 70–99)
Glucose-Capillary: 124 mg/dL — ABNORMAL HIGH (ref 70–99)

## 2021-08-22 MED ORDER — BISOPROLOL FUMARATE 5 MG PO TABS
2.5000 mg | ORAL_TABLET | Freq: Every day | ORAL | 2 refills | Status: DC
Start: 2021-08-22 — End: 2021-08-26

## 2021-08-22 MED ORDER — AMLODIPINE BESYLATE 10 MG PO TABS: 10.0000 mg | ORAL_TABLET | Freq: Every day | ORAL | 3 refills | Status: DC

## 2021-08-22 MED ORDER — AMLODIPINE BESYLATE 5 MG PO TABS
5.0000 mg | ORAL_TABLET | Freq: Once | ORAL | Status: AC
  Administered 2021-08-22: 5 mg via ORAL
  Filled 2021-08-22: qty 1

## 2021-08-22 NOTE — Plan of Care (Signed)
Dc instructions reviewed with daughter and patient, questions answered, verbalized understanding.  Patient transported to main entrance via wheelchair to be taken home by daughter.   ?

## 2021-08-22 NOTE — Telephone Encounter (Signed)
Appointment scheduled.

## 2021-08-22 NOTE — Discharge Summary (Signed)
?Physician Discharge Summary ?  ?Patient: Stacey Armstrong MRN: [id number] DOB: June 19, 1927  ?Admit date:     08/19/2021  ?Discharge date: 08/22/21  ?Discharge Physician: Oswald Hillock  ? ?PCP: [name], Rayford Halsted, MD  ? ?Recommendations at discharge:  ? ?Follow-up PCP in 1 week ?Check BMP in 1 week ? ?Discharge Diagnoses: ?Principal Problem: ?  AKI (acute kidney injury) ([place]) ?Active Problems: ?  Type 2 diabetes mellitus with hyperlipidemia ([place]) ?  Essential hypertension ?  Hyperlipidemia ?  Hyponatremia ?  Anemia in chronic renal disease ?  Urolithiasis ?  Mild protein malnutrition ([place]) ? ?Resolved Problems: ?  * No resolved hospital problems. * ? ?Hospital Course: ?88 year old female with a history of osteoarthritis, diabetes mellitus type 2, CKD stage IV, history of DVT after hysterectomy, GERD, history of COVID-19, history of TIA, moderately impaired hearing, hyperlipidemia, hypertension, sleep apnea, history of urolithiasis who underwent cystoscopy on 08/14/2021 with right ureteral stent placement due to right obstructing UPJ urolith and was discharged home on Bactrim.  Patient's daughter who brought her back to the ED with reports of headache, weakness, nausea, vomiting, poor appetite since starting Bactrim.  Also patient has been constipated for past 2 days.  Denies melena or hematochezia.  Positive hematuria but no flank pain or dysuria. ?Urinalysis was brown with a cloudy appearance large hemoglobinuria, proteinuria 100 mg/dL, moderate leukocyte esterase, more than 50 RBC and 6-10 WBC per hpf ?CT renal study showed 15 mm stone in the right renal pelvis with mild fullness of the right renal pelvis.  Right ureteral stent is in place.  No hydronephrosis.  There is colonic diverticulosis without diverticulitis.  No bowel obstruction. ? ?Assessment and Plan: ? ?Acute kidney injury ?-Patient's baseline creatinine 1.91 as of 08/14/2021 ?-Presented with creatinine of 2.24, improved to 1.66 after IV  fluids ?-Patient takes Ziac, which has 6.25 mg of HCTZ.  We will discontinue hydrochlorothiazide and prescribe bisoprolol 2.5 mg daily ?-Check BMP in 1 week ? ?Nausea and vomiting ?-Likely due to Bactrim ?-Resolved after stopping Bactrim ?-Patient has completed antibiotics in the hospital with IV ceftriaxone ?-Started on regular diet ? ?  ?Hyponatremia ?-Mild, likely from GI losses ?-HCTZ has been discontinued as above ?  ?Urolithiasis ?-Bactrim discontinued ?-Started on ceftriaxone 1 g IV daily ?-She has completed IV antibiotic therapy in the hospital ?-Follow-up with urology as scheduled ?  ?Diabetes mellitus type 2 ?-Metformin on hold due to acute kidney injury ?-Last hemoglobin A1c was around 6 ?-Can restart metformin once patient's renal function improves ?  ?Hypertension ?-Dose of amlodipine changed to 10 mg daily, HCTZ has been discontinued as above ?-Continue bisoprolol, amlodipine ?  ?Anemia of chronic disease ?-Hemoglobin stable ?  ?Mild protein malnutrition ?-Protein supplementation ? ?  ? ? ?Consultants:  ?Procedures performed:  ?Disposition: Home ?Diet recommendation:  ?Discharge Diet Orders (From admission, onward)  ? ?  Start     Ordered  ? 08/22/21 0000  Diet - low sodium heart healthy       ? 08/22/21 1107  ? ?  ?  ? ?  ? ?Regular diet ?DISCHARGE MEDICATION: ?Allergies as of 08/22/2021   ? ?   Reactions  ? Coconut (cocos Nucifera) Allergy Skin Test Hives  ? Penicillins Other (See Comments)  ? Unknown to pt daughter   ? Bactrim [sulfamethoxazole-trimethoprim] Nausea And Vomiting  ? Other Hives  ? Berries   ? ?  ? ?  ?Medication List  ?  ? ?STOP taking these medications   ? ?  bisoprolol-hydrochlorothiazide 2.5-6.25 MG tablet ?Commonly known as: ZIAC ?  ?metFORMIN 1000 MG tablet ?Commonly known as: GLUCOPHAGE ?  ?sulfamethoxazole-trimethoprim 800-160 MG tablet ?Commonly known as: BACTRIM DS ?  ? ?  ? ?TAKE these medications   ? ?amitriptyline 25 MG tablet ?Commonly known as: ELAVIL ?TAKE 1 TABLET EVERY  OTHER DAY ?What changed:  ?when to take this ?additional instructions ?  ?amLODipine 10 MG tablet ?Commonly known as: NORVASC ?Take 1 tablet (10 mg total) by mouth daily. ?What changed:  ?medication strength ?how much to take ?  ?aspirin EC 81 MG tablet ?Take 81 mg by mouth in the morning. ?  ?atorvastatin 20 MG tablet ?Commonly known as: LIPITOR ?TAKE 1 TABLET EVERY DAY ?What changed: when to take this ?  ?B-D SINGLE USE SWABS REGULAR Pads ?USE AS DIRECTED EVERY DAY ?  ?bisoprolol 5 MG tablet ?Commonly known as: ZEBETA ?Take 0.5 tablets (2.5 mg total) by mouth daily. ?  ?docusate sodium 100 MG capsule ?Commonly known as: Colace ?Take 1 capsule (100 mg total) by mouth 2 (two) times daily. ?What changed: when to take this ?  ?Fish Oil 1000 MG Caps ?Take 1,000 mg by mouth 2 (two) times daily with a meal. ?  ?ipratropium 0.03 % nasal spray ?Commonly known as: ATROVENT ?Place 1 spray into both nostrils 3 (three) times daily as needed (congestion/allergies.). ?  ?multivitamin with minerals Tabs tablet ?Take 1 tablet by mouth daily. Centrum Silver ?  ?mupirocin ointment 2 % ?Commonly known as: BACTROBAN ?Apply 1 application. topically daily as needed (nose rawness.). ?  ?polyethylene glycol powder 17 GM/SCOOP powder ?Commonly known as: GLYCOLAX/MIRALAX ?Take 17 g by mouth 2 (two) times daily as needed. ?What changed: when to take this ?  ?PROBIOTIC PO ?Take 1 capsule by mouth in the morning. ?  ?Systane Ultra 0.4-0.3 % Soln ?Generic drug: Polyethyl Glycol-Propyl Glycol ?Place 1-2 drops into both eyes 3 (three) times daily as needed (dry/irritated eyes.). ?  ?traMADol 50 MG tablet ?Commonly known as: ULTRAM ?Take 50-100 mg by mouth every 6 (six) hours as needed. ?  ?True Metrix Blood Glucose Test test strip ?Generic drug: glucose blood ?1 each by Other route daily. Dx E11.9 ?  ?True Metrix Meter Devi ?1 each by Does not apply route daily. ?  ?TRUEplus Lancets 33G Misc ?TEST BLOOD SUGAR EVERY DAY ?  ?Vitamin D3 50 MCG (2000  UT) Tabs ?Take 2,000 Units by mouth in the morning. ?  ? ?  ? ? Follow-up Information   ? ? [name], Rayford Halsted, MD Follow up in 1 week(s).   ?Specialty: Internal Medicine ?Why: Check BMP in 1 week ?Contact information: ?[address] ?[place] [address] ?[phone] ? ? ?  ?  ? ?  ?  ? ?  ? ?Discharge Exam: ?Danley Danker Weights  ? 08/19/21 2015  ?Weight: 72.6 kg  ? ?General-appears in no acute distress ?Heart-S1-S2, regular, no murmur auscultated ?Lungs-clear to auscultation bilaterally, no wheezing or crackles auscultated ?Abdomen-soft, nontender, no organomegaly ?Extremities-no edema in the lower extremities ?Neuro-alert, oriented x3, no focal deficit noted ? ?Condition at discharge: good ? ?The results of significant diagnostics from this hospitalization (including imaging, microbiology, ancillary and laboratory) are listed below for reference.  ? ?Imaging Studies: ?DG C-Arm 1-60 Min-No Report ? ?Result Date: 08/14/2021 ?Fluoroscopy was utilized by the requesting physician.  No radiographic interpretation.  ? ?CT Renal Stone Study ? ?Result Date: 08/20/2021 ?CLINICAL DATA:  Kidney stone. EXAM: CT ABDOMEN AND PELVIS WITHOUT CONTRAST TECHNIQUE:  Multidetector CT imaging of the abdomen and pelvis was performed following the standard protocol without IV contrast. RADIATION DOSE REDUCTION: This exam was performed according to the departmental dose-optimization program which includes automated exposure control, adjustment of the mA and/or kV according to patient size and/or use of iterative reconstruction technique. COMPARISON:  CT abdomen pelvis dated 06/28/2021. FINDINGS: Evaluation of this exam is limited in the absence of intravenous contrast. Lower chest: The visualized lung bases are clear. There is coronary vascular calcification. No intra-abdominal free air or free fluid. Hepatobiliary: The liver is unremarkable. No intrahepatic biliary dilatation. Cholecystectomy. No retained calcified stone noted  in the central CBD. Pancreas: Unremarkable. No pancreatic ductal dilatation or surrounding inflammatory changes. Spleen: Normal in size without focal abnormality. Adrenals/Urinary Tract: The right adrenal gland is un

## 2021-08-22 NOTE — Telephone Encounter (Signed)
Patient daughter called because she was instructed to contact office by Apolonio Schneiders after patient was discharged follow up/labs. I stated that I could get patient scheduled for hospital follow up, but I would not have any appointments until Monday afternoon and they were unsure if patient had to fast. Attempted to contact Apolonio Schneiders but she was not available. Daughter declined appointment at this time and requested a callback because she would like to see if the lab order could just be put in instead of having to do a full appointment with Dr.Hernandez, her and patient could just stop but lab. ? ? ? ?Please advise  ?

## 2021-08-26 ENCOUNTER — Ambulatory Visit (INDEPENDENT_AMBULATORY_CARE_PROVIDER_SITE_OTHER): Payer: Medicare HMO | Admitting: Internal Medicine

## 2021-08-26 ENCOUNTER — Telehealth: Payer: Self-pay

## 2021-08-26 ENCOUNTER — Telehealth: Payer: Self-pay | Admitting: *Deleted

## 2021-08-26 VITALS — BP 175/83 | HR 73 | Temp 97.4°F | Ht 63.0 in | Wt 156.6 lb

## 2021-08-26 DIAGNOSIS — E1169 Type 2 diabetes mellitus with other specified complication: Secondary | ICD-10-CM

## 2021-08-26 DIAGNOSIS — I10 Essential (primary) hypertension: Secondary | ICD-10-CM

## 2021-08-26 DIAGNOSIS — N179 Acute kidney failure, unspecified: Secondary | ICD-10-CM | POA: Diagnosis not present

## 2021-08-26 DIAGNOSIS — E871 Hypo-osmolality and hyponatremia: Secondary | ICD-10-CM

## 2021-08-26 DIAGNOSIS — E782 Mixed hyperlipidemia: Secondary | ICD-10-CM | POA: Diagnosis not present

## 2021-08-26 DIAGNOSIS — Z09 Encounter for follow-up examination after completed treatment for conditions other than malignant neoplasm: Secondary | ICD-10-CM

## 2021-08-26 LAB — BASIC METABOLIC PANEL
BUN: 23 mg/dL (ref 6–23)
CO2: 27 mEq/L (ref 19–32)
Calcium: 9.4 mg/dL (ref 8.4–10.5)
Chloride: 105 mEq/L (ref 96–112)
Creatinine, Ser: 1.37 mg/dL — ABNORMAL HIGH (ref 0.40–1.20)
GFR: 33.75 mL/min — ABNORMAL LOW (ref >60.00)
Glucose, Bld: 129 mg/dL — ABNORMAL HIGH (ref 70–99)
Potassium: 4.4 mEq/L (ref 3.5–5.1)
Sodium: 138 mEq/L (ref 135–145)

## 2021-08-26 LAB — LIPID PANEL
Cholesterol: 132 mg/dL (ref 0–200)
HDL: 51.4 mg/dL (ref >39.00)
LDL Cholesterol: 48 mg/dL (ref 0–99)
NonHDL: 80.15
Total CHOL/HDL Ratio: 3
Triglycerides: 159 mg/dL — ABNORMAL HIGH (ref 0.0–149.0)
VLDL: 31.8 mg/dL (ref 0.0–40.0)

## 2021-08-26 MED ORDER — ATORVASTATIN CALCIUM 20 MG PO TABS: 20.0000 mg | ORAL_TABLET | Freq: Every day | ORAL | 1 refills | Status: DC

## 2021-08-26 MED ORDER — AMITRIPTYLINE HCL 25 MG PO TABS: ORAL_TABLET | ORAL | 1 refills | Status: DC

## 2021-08-26 MED ORDER — AMLODIPINE BESYLATE 10 MG PO TABS: 10.0000 mg | ORAL_TABLET | Freq: Every day | ORAL | 3 refills | Status: DC

## 2021-08-26 MED ORDER — BISOPROLOL FUMARATE 5 MG PO TABS: 5.0000 mg | ORAL_TABLET | Freq: Every day | ORAL | 1 refills | Status: DC

## 2021-08-26 NOTE — Patient Instructions (Signed)
-  Nice seeing you today!! ? ?-Lab work today; will notify you once results are available. ? ?-Increase bisoprolol to a full tablet daily. ? ?-Start taking amitriptyline every 3rd night. ? ?-Schedule follow up in 3 months. ?

## 2021-08-26 NOTE — Telephone Encounter (Signed)
Pt had appt at 930- was planning to do TCM call  ?

## 2021-08-26 NOTE — Progress Notes (Signed)
? ? ? ?Hospital follow-up visit ? ? ? ? ?CC/Reason for Visit: Hospitalization follow-up ? ?HPI: Stacey Armstrong is a 88 y.o. female who is coming in today for the above mentioned reasons, specifically transitional care services face-to-face visit.   ? ?Dates hospitalized: 08/19/2021-08/22/2021 ?Days since discharge from hospital: 4 ?Patient was discharged from the hospital to: Home ?Reason for admission to hospital: Acute renal failure, hyponatremia ?Date of interactive phone contact with patient and/or caregiver: 08/26/2021 ? ?I have reviewed in detail patient's discharge summary plus pertinent specific notes, labs, and images from the hospitalization.  Yes ?On 3/22 she had a cystoscopy with placement of a right ureteral stent and started on Bactrim.  Ever since starting the Bactrim she noticed nausea, vomiting.  She presented to the hospital due to lethargy where she was noted to have acute on chronic kidney failure and hyponatremia and was admitted to the hospital with IV fluids.  She was taken off hydrochlorothiazide and left on bisoprolol as well as amlodipine.  Creatinine had improved from a high of 2.24 down to 1.66 on discharge, her baseline creatinine is around 1.91.  They also discontinued her metformin due to her CKD which I think is appropriate. ? ?Medication reconciliation was done today and patient is taking meds as recommended by discharging hospitalist/specialist.  Yes ? ? ?Past Medical/Surgical History: ?Past Medical History:  ?Diagnosis Date  ? Arthritis   ? Complication of anesthesia   ? after gallbladder surgery with extubation had shallow respirations,  needed to continue intudation for additonal 10-12 days  ? Diabetes ([place])   ? DVT (deep venous thrombosis) ([place])   ? left leg after hysterectomy  ? GERD (gastroesophageal reflux disease)   ? History of COVID-19 05/2021  ? History of kidney stones   ? History of transient ischemic attack (TIA)   ? HOH (hard of hearing)   ? Hyperlipidemia   ?  Hypertension   ? Sleep apnea   ? ? ?Past Surgical History:  ?Procedure Laterality Date  ? ABDOMINAL HYSTERECTOMY    ? APPENDECTOMY    ? CATARACT EXTRACTION, BILATERAL    ? CHOLECYSTECTOMY    ? COLONOSCOPY    ? CYSTOSCOPY WITH STENT PLACEMENT Right 08/14/2021  ? Procedure: CYSTOSCOPY WITH STENT PLACEMENT;  Surgeon: Ardis Hughs, MD;  Location: [facility];  Service: Urology;  Laterality: Right;  ONLY NEEDS 30 MIN  ? ? ?Social History: ? reports that she has quit smoking. She has never used smokeless tobacco. She reports that she does not currently use alcohol. She reports that she does not use drugs. ? ?Allergies: ?Allergies  ?Allergen Reactions  ? Coconut (Cocos Nucifera) Allergy Skin Test Hives  ? Penicillins Other (See Comments)  ?  Unknown to pt daughter   ? Bactrim [Sulfamethoxazole-Trimethoprim] Nausea And Vomiting  ? Other Hives  ?  Berries   ? ? ?Family History:  ?Family History  ?Problem Relation Age of Onset  ? CAD Maternal Grandmother   ? Pneumonia Maternal Grandfather   ? ? ? ?Current Outpatient Medications:  ?  Alcohol Swabs (B-D SINGLE USE SWABS REGULAR) PADS, USE AS DIRECTED EVERY DAY, Disp: 100 each, Rfl: 0 ?  aspirin EC 81 MG tablet, Take 81 mg by mouth in the morning., Disp: , Rfl:  ?  Blood Glucose Monitoring Suppl (TRUE METRIX METER) DEVI, 1 each by Does not apply route daily., Disp: 1 each, Rfl: 1 ?  Cholecalciferol (VITAMIN D3) 50 MCG (2000 UT) TABS, Take 2,000 Units by mouth in  the morning., Disp: , Rfl:  ?  docusate sodium (COLACE) 100 MG capsule, Take 1 capsule (100 mg total) by mouth 2 (two) times daily. (Patient taking differently: Take 100 mg by mouth in the morning.), Disp: 60 capsule, Rfl: 11 ?  glucose blood (TRUE METRIX BLOOD GLUCOSE TEST) test strip, 1 each by Other route daily. Dx E11.9, Disp: 100 each, Rfl: 12 ?  ipratropium (ATROVENT) 0.03 % nasal spray, Place 1 spray into both nostrils 3 (three) times daily as needed (congestion/allergies.)., Disp: , Rfl:  ?  Multiple Vitamin  (MULTIVITAMIN WITH MINERALS) TABS tablet, Take 1 tablet by mouth daily. Centrum Silver, Disp: , Rfl:  ?  mupirocin ointment (BACTROBAN) 2 %, Apply 1 application. topically daily as needed (nose rawness.)., Disp: , Rfl:  ?  Omega-3 Fatty Acids (FISH OIL) 1000 MG CAPS, Take 1,000 mg by mouth 2 (two) times daily with a meal., Disp: , Rfl:  ?  Polyethyl Glycol-Propyl Glycol (SYSTANE ULTRA) 0.4-0.3 % SOLN, Place 1-2 drops into both eyes 3 (three) times daily as needed (dry/irritated eyes.)., Disp: , Rfl:  ?  polyethylene glycol powder (GLYCOLAX/MIRALAX) 17 GM/SCOOP powder, Take 17 g by mouth 2 (two) times daily as needed. (Patient taking differently: Take 17 g by mouth in the morning.), Disp: 3350 g, Rfl: 1 ?  Probiotic Product (PROBIOTIC PO), Take 1 capsule by mouth in the morning., Disp: , Rfl:  ?  TRUEplus Lancets 33G MISC, TEST BLOOD SUGAR EVERY DAY, Disp: 100 each, Rfl: 1 ?  amitriptyline (ELAVIL) 25 MG tablet, Take 1 tablet every third night, Disp: 45 tablet, Rfl: 1 ?  amLODipine (NORVASC) 10 MG tablet, Take 1 tablet (10 mg total) by mouth daily., Disp: 30 tablet, Rfl: 3 ?  atorvastatin (LIPITOR) 20 MG tablet, Take 1 tablet (20 mg total) by mouth daily., Disp: 90 tablet, Rfl: 1 ?  bisoprolol (ZEBETA) 5 MG tablet, Take 1 tablet (5 mg total) by mouth daily., Disp: 90 tablet, Rfl: 1 ?  traMADol (ULTRAM) 50 MG tablet, Take 50-100 mg by mouth every 6 (six) hours as needed. (Patient not taking: Reported on 08/26/2021), Disp: , Rfl:  ? ?Review of Systems:  ?Constitutional: Denies fever, chills, diaphoresis, appetite change and fatigue.  ?HEENT: Denies photophobia, eye pain, redness, hearing loss, ear pain, congestion, sore throat, rhinorrhea, sneezing, mouth sores, trouble swallowing, neck pain, neck stiffness and tinnitus.   ?Respiratory: Denies SOB, DOE, cough, chest tightness,  and wheezing.   ?Cardiovascular: Denies chest pain, palpitations and leg swelling.  ?Gastrointestinal: Denies nausea, vomiting, abdominal pain,  diarrhea, constipation, blood in stool and abdominal distention.  ?Genitourinary: Denies dysuria, urgency, frequency, hematuria, flank pain and difficulty urinating.  ?Endocrine: Denies: hot or cold intolerance, sweats, changes in hair or nails, polyuria, polydipsia. ?Musculoskeletal: Denies myalgias, back pain, joint swelling, arthralgias and gait problem.  ?Skin: Denies pallor, rash and wound.  ?Neurological: Denies dizziness, seizures, syncope, weakness, light-headedness, numbness and headaches.  ?Hematological: Denies adenopathy. Easy bruising, personal or family bleeding history  ?Psychiatric/Behavioral: Denies suicidal ideation, mood changes, confusion, nervousness, sleep disturbance and agitation ? ? ? ?Physical Exam: ?Vitals:  ? 08/26/21 0941 08/26/21 0946  ?BP: (!) 160/70 (!) 175/83  ?Pulse: 73   ?Temp: (!) 97.4 ?F (36.3 ?C)   ?TempSrc: Oral   ?SpO2: 95%   ?Weight: 156 lb 9.6 oz (71 kg)   ?Height: $RemoveB'5\' 3"'AGcpVePa$  (1.6 m)   ? ? ?Body mass index is 27.74 kg/m?. ? ? ?Constitutional: NAD, calm, comfortable ?Eyes: PERRL, lids and conjunctivae normal ?ENMT:  Mucous membranes are moist. Posterior pharynx clear of any exudate or lesions. Normal dentition. Tympanic membrane is pearly white, no erythema or bulging. ?Neck: normal, supple, no masses, no thyromegaly ?Respiratory: clear to auscultation bilaterally, no wheezing, no crackles. Normal respiratory effort. No accessory muscle use.  ?Cardiovascular: Regular rate and rhythm, no murmurs / rubs / gallops. No extremity edema. 2+ pedal pulses. No carotid bruits.  ?Abdomen: no tenderness, no masses palpated. No hepatosplenomegaly. Bowel sounds positive.  ?Musculoskeletal: Significant upper extremity bruising and right hand edema. ?Skin: no rashes, lesions, ulcers. No induration ?Neurologic: CN 2-12 grossly intact. Sensation intact, DTR normal. Strength 5/5 in all 4.  ?Psychiatric: Normal judgment and insight. Alert and oriented x 3. Normal mood.  ? ? ?Impression and  Plan: ? ?Hospital discharge follow-up ? ?AKI (acute kidney injury) ([place]) - Plan: Basic metabolic panel ? ?Hyponatremia ? ?Essential hypertension - Plan: bisoprolol (ZEBETA) 5 MG tablet, amLODipine (NORVASC) 10 MG tablet, amitr

## 2021-08-26 NOTE — Telephone Encounter (Signed)
Transition Care Management Follow-up Telephone Call ? ?Date discharged? ? ?How have you been since you were released from the hospital? good ? ?Do you understand why you were in the hospital? yes ? ?Do you understand the discharge instructions? yes ? ?Where were you discharged to? home ? ? ?Items Reviewed: ?Medications reviewed: yes ?Allergies reviewed: yes ?Dietary changes reviewed: yes ?Referrals reviewed: yes ? ? ?Functional Questionnaire:  ?Activities of Daily Living (ADLs):   ?She states they are independent in the following: ambulation, bathing and hygiene, feeding, continence, grooming, toileting, and dressing ?States they require assistance with the following:  none ? ?Any transportation issues/concerns?: no ? ?Any patient concerns? Yes edema with right hand ? ?Confirmed importance and date/time of follow-up visits scheduled yes ?Provider Appointment booked with Dr Jerilee Hoh 08/26/21  ? ?Confirmed with patient if condition begins to worsen call PCP or go to the ER.  Patient was given the office number and encouraged to call back with question or concerns.  : yes ? ?

## 2021-09-04 ENCOUNTER — Emergency Department (HOSPITAL_COMMUNITY): Payer: Medicare HMO

## 2021-09-04 ENCOUNTER — Encounter (HOSPITAL_COMMUNITY): Payer: Self-pay

## 2021-09-04 ENCOUNTER — Inpatient Hospital Stay (HOSPITAL_COMMUNITY)
Admission: EM | Admit: 2021-09-04 | Discharge: 2021-09-09 | DRG: 202 | Disposition: A | Discharge status: Home Health | Payer: Medicare HMO | Attending: Family Medicine | Admitting: Family Medicine

## 2021-09-04 DIAGNOSIS — K219 Gastro-esophageal reflux disease without esophagitis: Secondary | ICD-10-CM | POA: Diagnosis present

## 2021-09-04 DIAGNOSIS — J9600 Acute respiratory failure, unspecified whether with hypoxia or hypercapnia: Secondary | ICD-10-CM | POA: Diagnosis present

## 2021-09-04 DIAGNOSIS — E785 Hyperlipidemia, unspecified: Secondary | ICD-10-CM | POA: Diagnosis not present

## 2021-09-04 DIAGNOSIS — R531 Weakness: Secondary | ICD-10-CM

## 2021-09-04 DIAGNOSIS — Z8673 Personal history of transient ischemic attack (TIA), and cerebral infarction without residual deficits: Secondary | ICD-10-CM | POA: Diagnosis not present

## 2021-09-04 DIAGNOSIS — Z90710 Acquired absence of both cervix and uterus: Secondary | ICD-10-CM

## 2021-09-04 DIAGNOSIS — I129 Hypertensive chronic kidney disease with stage 1 through stage 4 chronic kidney disease, or unspecified chronic kidney disease: Secondary | ICD-10-CM | POA: Diagnosis present

## 2021-09-04 DIAGNOSIS — Z79899 Other long term (current) drug therapy: Secondary | ICD-10-CM

## 2021-09-04 DIAGNOSIS — Z7982 Long term (current) use of aspirin: Secondary | ICD-10-CM

## 2021-09-04 DIAGNOSIS — Z8616 Personal history of COVID-19: Secondary | ICD-10-CM | POA: Diagnosis not present

## 2021-09-04 DIAGNOSIS — J441 Chronic obstructive pulmonary disease with (acute) exacerbation: Secondary | ICD-10-CM | POA: Diagnosis present

## 2021-09-04 DIAGNOSIS — J40 Bronchitis, not specified as acute or chronic: Secondary | ICD-10-CM | POA: Diagnosis not present

## 2021-09-04 DIAGNOSIS — R7989 Other specified abnormal findings of blood chemistry: Secondary | ICD-10-CM | POA: Diagnosis not present

## 2021-09-04 DIAGNOSIS — R911 Solitary pulmonary nodule: Secondary | ICD-10-CM | POA: Diagnosis not present

## 2021-09-04 DIAGNOSIS — N184 Chronic kidney disease, stage 4 (severe): Secondary | ICD-10-CM | POA: Diagnosis present

## 2021-09-04 DIAGNOSIS — Z8249 Family history of ischemic heart disease and other diseases of the circulatory system: Secondary | ICD-10-CM

## 2021-09-04 DIAGNOSIS — E1169 Type 2 diabetes mellitus with other specified complication: Secondary | ICD-10-CM | POA: Diagnosis present

## 2021-09-04 DIAGNOSIS — J440 Chronic obstructive pulmonary disease with acute lower respiratory infection: Secondary | ICD-10-CM | POA: Diagnosis present

## 2021-09-04 DIAGNOSIS — Z20822 Contact with and (suspected) exposure to covid-19: Secondary | ICD-10-CM | POA: Diagnosis not present

## 2021-09-04 DIAGNOSIS — J208 Acute bronchitis due to other specified organisms: Secondary | ICD-10-CM | POA: Diagnosis present

## 2021-09-04 DIAGNOSIS — I509 Heart failure, unspecified: Principal | ICD-10-CM

## 2021-09-04 DIAGNOSIS — E1122 Type 2 diabetes mellitus with diabetic chronic kidney disease: Secondary | ICD-10-CM | POA: Diagnosis present

## 2021-09-04 DIAGNOSIS — K5909 Other constipation: Secondary | ICD-10-CM | POA: Diagnosis present

## 2021-09-04 DIAGNOSIS — E1165 Type 2 diabetes mellitus with hyperglycemia: Secondary | ICD-10-CM | POA: Diagnosis present

## 2021-09-04 DIAGNOSIS — I110 Hypertensive heart disease with heart failure: Secondary | ICD-10-CM | POA: Diagnosis not present

## 2021-09-04 DIAGNOSIS — J206 Acute bronchitis due to rhinovirus: Secondary | ICD-10-CM | POA: Diagnosis not present

## 2021-09-04 DIAGNOSIS — J9601 Acute respiratory failure with hypoxia: Secondary | ICD-10-CM | POA: Diagnosis not present

## 2021-09-04 DIAGNOSIS — Z87891 Personal history of nicotine dependence: Secondary | ICD-10-CM

## 2021-09-04 DIAGNOSIS — Z86718 Personal history of other venous thrombosis and embolism: Secondary | ICD-10-CM

## 2021-09-04 DIAGNOSIS — J209 Acute bronchitis, unspecified: Secondary | ICD-10-CM | POA: Diagnosis not present

## 2021-09-04 DIAGNOSIS — R059 Cough, unspecified: Secondary | ICD-10-CM | POA: Diagnosis not present

## 2021-09-04 DIAGNOSIS — I10 Essential (primary) hypertension: Secondary | ICD-10-CM | POA: Diagnosis present

## 2021-09-04 DIAGNOSIS — J9811 Atelectasis: Secondary | ICD-10-CM | POA: Diagnosis not present

## 2021-09-04 LAB — COMPREHENSIVE METABOLIC PANEL
ALT: 14 U/L (ref 0–44)
AST: 19 U/L (ref 15–41)
Albumin: 3.2 g/dL — ABNORMAL LOW (ref 3.5–5.0)
Alkaline Phosphatase: 74 U/L (ref 38–126)
Anion gap: 6 (ref 5–15)
BUN: 24 mg/dL — ABNORMAL HIGH (ref 8–23)
CO2: 26 mmol/L (ref 22–32)
Calcium: 9 mg/dL (ref 8.9–10.3)
Chloride: 105 mmol/L (ref 98–111)
Creatinine, Ser: 1.55 mg/dL — ABNORMAL HIGH (ref 0.44–1.00)
GFR, Estimated: 31 mL/min — ABNORMAL LOW (ref >60)
Glucose, Bld: 134 mg/dL — ABNORMAL HIGH (ref 70–99)
Potassium: 4.3 mmol/L (ref 3.5–5.1)
Sodium: 137 mmol/L (ref 135–145)
Total Bilirubin: 0.8 mg/dL (ref 0.3–1.2)
Total Protein: 6.6 g/dL (ref 6.5–8.1)

## 2021-09-04 LAB — CBC WITH DIFFERENTIAL/PLATELET
Abs Immature Granulocytes: 0.05 10*3/uL (ref 0.00–0.07)
Basophils Absolute: 0 10*3/uL (ref 0.0–0.1)
Basophils Relative: 0 %
Eosinophils Absolute: 0.2 10*3/uL (ref 0.0–0.5)
Eosinophils Relative: 2 %
HCT: 30.2 % — ABNORMAL LOW (ref 36.0–46.0)
Hemoglobin: 9.7 g/dL — ABNORMAL LOW (ref 12.0–15.0)
Immature Granulocytes: 1 %
Lymphocytes Relative: 13 %
Lymphs Abs: 1.3 10*3/uL (ref 0.7–4.0)
MCH: 29.1 pg (ref 26.0–34.0)
MCHC: 32.1 g/dL (ref 30.0–36.0)
MCV: 90.7 fL (ref 80.0–100.0)
Monocytes Absolute: 1.1 10*3/uL — ABNORMAL HIGH (ref 0.1–1.0)
Monocytes Relative: 11 %
Neutro Abs: 7.4 10*3/uL (ref 1.7–7.7)
Neutrophils Relative %: 73 %
Platelets: 308 10*3/uL (ref 150–400)
RBC: 3.33 MIL/uL — ABNORMAL LOW (ref 3.87–5.11)
RDW: 15 % (ref 11.5–15.5)
WBC: 10 10*3/uL (ref 4.0–10.5)
nRBC: 0 % (ref 0.0–0.2)

## 2021-09-04 LAB — RESP PANEL BY RT-PCR (FLU A&B, COVID) ARPGX2
Influenza A by PCR: NEGATIVE
Influenza B by PCR: NEGATIVE
SARS Coronavirus 2 by RT PCR: NEGATIVE

## 2021-09-04 MED ORDER — OXYMETAZOLINE HCL 0.05 % NA SOLN
1.0000 | Freq: Once | NASAL | Status: AC
  Administered 2021-09-05: 1 via NASAL
  Filled 2021-09-04: qty 30

## 2021-09-04 MED ORDER — IPRATROPIUM-ALBUTEROL 0.5-2.5 (3) MG/3ML IN SOLN
3.0000 mL | Freq: Once | RESPIRATORY_TRACT | Status: AC
  Administered 2021-09-05: 3 mL via RESPIRATORY_TRACT
  Filled 2021-09-04: qty 3

## 2021-09-04 MED ORDER — BENZONATATE 100 MG PO CAPS
100.0000 mg | ORAL_CAPSULE | Freq: Once | ORAL | Status: AC
  Administered 2021-09-05: 100 mg via ORAL
  Filled 2021-09-04: qty 1

## 2021-09-04 NOTE — ED Triage Notes (Signed)
Pt brought in by daughter c/o cough, runny nose, and sneezing.  ? ?Admitted last week for dehydration per daughter.  ? ?Pt ambulatory in triage.  ? ?A/Ox4 ?

## 2021-09-04 NOTE — ED Provider Triage Note (Signed)
Emergency Medicine Provider Triage Evaluation Note ? ?Stacey Armstrong , a 88 y.o. female  was evaluated in triage.  Pt brought in by daughter.  Reports that yesterday she had nasal congestion and sinus pressure.  They were flying back from [place].  Daughter called patient this morning and noticed that she had a severe cough.  She tried to call PCP to have her seen however when they heard patient coughing they advised to come to the ED for further evaluation.  Family member denies any recent sick contacts.  They did do a COVID test at home which was negative. ?Family member reports that patient was recently admitted for dehydration and sodium issues.  There is supposed to have labs rechecked this week however have not had a chance to do so.. ? ?Review of Systems  ?Positive: + cough, congestion, sinus pressure ?Negative: - fevers ? ?Physical Exam  ?BP (!) 162/58 (BP Location: Left Arm)   Pulse 70   Temp 99 ?F (37.2 ?C) (Oral)   Resp 20   Ht $R'5\' 3"'JB$  (1.6 m)   Wt 64.5 kg   SpO2 98%   BMI 25.18 kg/m?  ?Gen:   Awake, no distress   ?Resp:  Actively coughing, diminished breath sounds ?MSK:   Moves extremities without difficulty  ?Other:   ? ?Medical Decision Making  ?Medically screening exam initiated at 6:51 PM.  Appropriate orders placed.  Shaquoia Miers was informed that the remainder of the evaluation will be completed by another provider, this initial triage assessment does not replace that evaluation, and the importance of remaining in the ED until their evaluation is complete. ? ? ?  ?Eustaquio Maize, PA-C ?09/04/21 1852 ? ?

## 2021-09-04 NOTE — ED Provider Notes (Signed)
?[facility] DEPT ?Provider Note ? ? ?CSN: [id number] ?Arrival date & time: 09/04/21  1830 ? ?  ? ?History ? ?Chief Complaint  ?Patient presents with  ? Cough  ? ? ?Stacey Armstrong is a 88 y.o. female. ? ?The history is provided by the patient, a relative and medical records.  ?Cough ?Stacey Armstrong is a 88 y.o. female who presents to the Emergency Department complaining of cough.  She presents to the ED accompanied by her daughter for evaluation of cough, stuffy nose that started Monday morning while in [place].  She tried coricidin.  Overall sxs have worsened.   ? ?Temp to 99 today.  No chest pain.  She does have some discomfort with coughing.  Cough is minimally productive.  Has mild SOB - cannot breathe well through her nose.  No N/V.  Had lower extremity edema while in [place] - overall improving.   ? ?She was admitted 3/28 for dehydration, discharged 3/30 and had medications adjusted.  Recent right ureteral stent placement.   ?Hx/o DM. CKD, DVT after hysterectomy, HTN, HPL. ?  ? ?Home Medications ?Prior to Admission medications   ?Medication Sig Start Date End Date Taking? Authorizing Provider  ?amitriptyline (ELAVIL) 25 MG tablet Take 1 tablet every third night ?Patient taking differently: Take 25 mg by mouth See admin instructions. Take 25mg  by mouth every third night 08/26/21  Yes [name], Rayford Halsted, MD  ?amLODipine (NORVASC) 10 MG tablet Take 1 tablet (10 mg total) by mouth daily. 08/26/21 12/24/21 Yes Erline Hau, MD  ?aspirin EC 81 MG tablet Take 81 mg by mouth in the morning.   Yes [provider]  ?atorvastatin (LIPITOR) 20 MG tablet Take 1 tablet (20 mg total) by mouth daily. ?Patient taking differently: Take 20 mg by mouth at bedtime. 08/26/21  Yes [name], Rayford Halsted, MD  ?bisoprolol (ZEBETA) 5 MG tablet Take 1 tablet (5 mg total) by mouth daily. 08/26/21  Yes [name], Rayford Halsted, MD  ?Cholecalciferol (VITAMIN D3) 50 MCG (2000 UT) TABS  Take 2,000 Units by mouth in the morning.   Yes [provider]  ?docusate sodium (COLACE) 100 MG capsule Take 1 capsule (100 mg total) by mouth 2 (two) times daily. 12/30/18  Yes [name], Rayford Halsted, MD  ?Multiple Vitamin (MULTIVITAMIN WITH MINERALS) TABS tablet Take 1 tablet by mouth daily. Centrum Silver   Yes [provider]  ?mupirocin ointment (BACTROBAN) 2 % Apply 1 application. topically daily as needed (nose rawness.).   Yes [provider]  ?Omega-3 Fatty Acids (FISH OIL) 1000 MG CAPS Take 1,000 mg by mouth 2 (two) times daily with a meal.   Yes [provider]  ?Polyethyl Glycol-Propyl Glycol (SYSTANE ULTRA) 0.4-0.3 % SOLN Place 1-2 drops into both eyes 3 (three) times daily as needed (dry/irritated eyes.).   Yes [provider]  ?polyethylene glycol powder (GLYCOLAX/MIRALAX) 17 GM/SCOOP powder Take 17 g by mouth 2 (two) times daily as needed. ?Patient taking differently: Take 17 g by mouth in the morning. 12/30/18  Yes [name], Rayford Halsted, MD  ?Probiotic Product (PROBIOTIC PO) Take 1 capsule by mouth in the morning.   Yes [provider]  ?traMADol (ULTRAM) 50 MG tablet Take 50-100 mg by mouth every 6 (six) hours as needed for severe pain. 08/13/21  Yes [provider]  ?Alcohol Swabs (B-D SINGLE USE SWABS REGULAR) PADS USE AS DIRECTED EVERY DAY 09/11/20   [name], Rayford Halsted, MD  ?Blood  Glucose Monitoring Suppl (TRUE METRIX METER) DEVI 1 each by Does not apply route daily. 09/28/20   [name], Rayford Halsted, MD  ?glucose blood (TRUE METRIX BLOOD GLUCOSE TEST) test strip 1 each by Other route daily. Dx E11.9 10/03/20   [name], Rayford Halsted, MD  ?ipratropium (ATROVENT) 0.03 % nasal spray Place 1 spray into both nostrils 3 (three) times daily as needed (congestion/allergies.). 01/22/21   [provider]  ?TRUEplus Lancets 33G MISC TEST BLOOD SUGAR EVERY DAY 07/03/21   [name], Rayford Halsted, MD  ?   ? ?Allergies     ?Coconut (cocos nucifera) allergy skin test, Penicillins, Bactrim [sulfamethoxazole-trimethoprim], and Other   ? ?Review of Systems   ?Review of Systems  ?Respiratory:  Positive for cough.   ?All other systems reviewed and are negative. ? ?Physical Exam ?Updated Vital Signs ?BP (!) 178/62   Pulse 72   Temp 99 ?F (37.2 ?C) (Oral)   Resp 16   Ht $R'5\' 3"'IV$  (1.6 m)   Wt 64.5 kg   SpO2 100%   BMI 25.18 kg/m?  ?Physical Exam ?Vitals and nursing note reviewed.  ?Constitutional:   ?   Appearance: She is well-developed.  ?HENT:  ?   Head: Normocephalic and atraumatic.  ?Cardiovascular:  ?   Rate and Rhythm: Normal rate and regular rhythm.  ?   Heart sounds: No murmur heard. ?Pulmonary:  ?   Effort: Pulmonary effort is normal. No respiratory distress.  ?   Comments: Diffuse wheezes ?Abdominal:  ?   Palpations: Abdomen is soft.  ?   Tenderness: There is no abdominal tenderness. There is no guarding or rebound.  ?Musculoskeletal:     ?   General: Swelling present. No tenderness.  ?   Comments: 2+ pitting edema to RLE, 3+ pitting edema to LLE  ?Skin: ?   General: Skin is warm and dry.  ?Neurological:  ?   Mental Status: She is alert and oriented to person, place, and time.  ?Psychiatric:     ?   Behavior: Behavior normal.  ? ? ?ED Results / Procedures / Treatments   ?Labs ?(all labs ordered are listed, but only abnormal results are displayed) ?Labs Reviewed  ?COMPREHENSIVE METABOLIC PANEL - Abnormal; Notable for the following components:  ?    Result Value  ? Glucose, Bld 134 (*)   ? BUN 24 (*)   ? Creatinine, Ser 1.55 (*)   ? Albumin 3.2 (*)   ? GFR, Estimated 31 (*)   ? All other components within normal limits  ?CBC WITH DIFFERENTIAL/PLATELET - Abnormal; Notable for the following components:  ? RBC 3.33 (*)   ? Hemoglobin 9.7 (*)   ? HCT 30.2 (*)   ? Monocytes Absolute 1.1 (*)   ? All other components within normal limits  ?D-DIMER, QUANTITATIVE - Abnormal; Notable for the following components:  ? D-Dimer, Quant 1.29 (*)    ? All other components within normal limits  ?BRAIN NATRIURETIC PEPTIDE - Abnormal; Notable for the following components:  ? B Natriuretic Peptide 245.9 (*)   ? All other components within normal limits  ?CBC - Abnormal; Notable for the following components:  ? WBC 10.8 (*)   ? RBC 3.25 (*)   ? Hemoglobin 9.4 (*)   ? HCT 29.6 (*)   ? All other components within normal limits  ?CREATININE, SERUM - Abnormal; Notable for the following components:  ? Creatinine, Ser 1.40 (*)   ? GFR, Estimated 36 (*)   ?  All other components within normal limits  ?CBG MONITORING, ED - Abnormal; Notable for the following components:  ? Glucose-Capillary 126 (*)   ? All other components within normal limits  ?RESP PANEL BY RT-PCR (FLU A&B, COVID) [id number]  ?RESPIRATORY PANEL BY PCR  ?TROPONIN I (HIGH SENSITIVITY)  ? ? ?EKG ?EKG Interpretation ? ?Date/Time:  Wednesday September 04 2021 18:41:25 EDT ?Ventricular Rate:  69 ?PR Interval:  142 ?QRS Duration: 92 ?QT Interval:  372 ?QTC Calculation: 398 ?R Axis:   59 ?Text Interpretation: Normal sinus rhythm Nonspecific ST and T wave abnormality Abnormal ECG No significant change since last tracing Confirmed by Quintella Reichert [phone]) on 09/04/2021 11:44:32 PM ? ?Radiology ?DG Chest 2 View ? ?Result Date: 09/04/2021 ?CLINICAL DATA:  Cough and runny nose. EXAM: CHEST - 2 VIEW COMPARISON:  None. FINDINGS: The lungs are hyperinflated. Diffuse, chronic appearing increased interstitial lung markings are seen. Mild atelectasis is noted within the left lung base. There is no evidence of a pleural effusion or pneumothorax. The heart size and mediastinal contours are within normal limits. There is marked severity calcification of the aortic arch. Radiopaque surgical clips are noted within the right upper quadrant. Multilevel degenerative changes seen throughout the thoracic spine. IMPRESSION: Chronic appearing increased interstitial lung markings with mild left basilar atelectasis. Electronically Signed   By:  Virgina Norfolk M.D.   On: 09/04/2021 19:11  ? ?CT Angio Chest PE W/Cm &/Or Wo Cm ? ?Result Date: 09/05/2021 ?CLINICAL DATA:  Elevated D-dimer, pulmonary embolism suspected. EXAM: CT ANGIOGRAPHY CHEST WITH CO

## 2021-09-05 ENCOUNTER — Emergency Department (HOSPITAL_COMMUNITY): Payer: Medicare HMO

## 2021-09-05 ENCOUNTER — Other Ambulatory Visit: Payer: Self-pay

## 2021-09-05 ENCOUNTER — Encounter (HOSPITAL_COMMUNITY): Payer: Self-pay

## 2021-09-05 DIAGNOSIS — E785 Hyperlipidemia, unspecified: Secondary | ICD-10-CM | POA: Diagnosis not present

## 2021-09-05 DIAGNOSIS — Z8249 Family history of ischemic heart disease and other diseases of the circulatory system: Secondary | ICD-10-CM | POA: Diagnosis not present

## 2021-09-05 DIAGNOSIS — Z86718 Personal history of other venous thrombosis and embolism: Secondary | ICD-10-CM | POA: Diagnosis not present

## 2021-09-05 DIAGNOSIS — Z20822 Contact with and (suspected) exposure to covid-19: Secondary | ICD-10-CM | POA: Diagnosis present

## 2021-09-05 DIAGNOSIS — E1169 Type 2 diabetes mellitus with other specified complication: Secondary | ICD-10-CM | POA: Diagnosis not present

## 2021-09-05 DIAGNOSIS — K219 Gastro-esophageal reflux disease without esophagitis: Secondary | ICD-10-CM | POA: Diagnosis present

## 2021-09-05 DIAGNOSIS — R7989 Other specified abnormal findings of blood chemistry: Secondary | ICD-10-CM | POA: Diagnosis not present

## 2021-09-05 DIAGNOSIS — J40 Bronchitis, not specified as acute or chronic: Secondary | ICD-10-CM | POA: Diagnosis not present

## 2021-09-05 DIAGNOSIS — Z87891 Personal history of nicotine dependence: Secondary | ICD-10-CM | POA: Diagnosis not present

## 2021-09-05 DIAGNOSIS — E1122 Type 2 diabetes mellitus with diabetic chronic kidney disease: Secondary | ICD-10-CM | POA: Diagnosis present

## 2021-09-05 DIAGNOSIS — J206 Acute bronchitis due to rhinovirus: Secondary | ICD-10-CM | POA: Diagnosis present

## 2021-09-05 DIAGNOSIS — Z8616 Personal history of COVID-19: Secondary | ICD-10-CM | POA: Diagnosis not present

## 2021-09-05 DIAGNOSIS — Z79899 Other long term (current) drug therapy: Secondary | ICD-10-CM | POA: Diagnosis not present

## 2021-09-05 DIAGNOSIS — K5909 Other constipation: Secondary | ICD-10-CM | POA: Diagnosis present

## 2021-09-05 DIAGNOSIS — R059 Cough, unspecified: Secondary | ICD-10-CM | POA: Diagnosis present

## 2021-09-05 DIAGNOSIS — R911 Solitary pulmonary nodule: Secondary | ICD-10-CM | POA: Diagnosis present

## 2021-09-05 DIAGNOSIS — J9601 Acute respiratory failure with hypoxia: Secondary | ICD-10-CM | POA: Diagnosis not present

## 2021-09-05 DIAGNOSIS — I129 Hypertensive chronic kidney disease with stage 1 through stage 4 chronic kidney disease, or unspecified chronic kidney disease: Secondary | ICD-10-CM | POA: Diagnosis present

## 2021-09-05 DIAGNOSIS — Z8673 Personal history of transient ischemic attack (TIA), and cerebral infarction without residual deficits: Secondary | ICD-10-CM | POA: Diagnosis not present

## 2021-09-05 DIAGNOSIS — J440 Chronic obstructive pulmonary disease with acute lower respiratory infection: Secondary | ICD-10-CM | POA: Diagnosis present

## 2021-09-05 DIAGNOSIS — J208 Acute bronchitis due to other specified organisms: Secondary | ICD-10-CM | POA: Diagnosis not present

## 2021-09-05 DIAGNOSIS — Z90710 Acquired absence of both cervix and uterus: Secondary | ICD-10-CM | POA: Diagnosis not present

## 2021-09-05 DIAGNOSIS — J9600 Acute respiratory failure, unspecified whether with hypoxia or hypercapnia: Secondary | ICD-10-CM | POA: Diagnosis present

## 2021-09-05 DIAGNOSIS — N184 Chronic kidney disease, stage 4 (severe): Secondary | ICD-10-CM | POA: Diagnosis present

## 2021-09-05 DIAGNOSIS — E1165 Type 2 diabetes mellitus with hyperglycemia: Secondary | ICD-10-CM | POA: Diagnosis present

## 2021-09-05 DIAGNOSIS — J441 Chronic obstructive pulmonary disease with (acute) exacerbation: Secondary | ICD-10-CM | POA: Diagnosis present

## 2021-09-05 DIAGNOSIS — Z7982 Long term (current) use of aspirin: Secondary | ICD-10-CM | POA: Diagnosis not present

## 2021-09-05 DIAGNOSIS — R531 Weakness: Secondary | ICD-10-CM | POA: Diagnosis not present

## 2021-09-05 LAB — CBC
HCT: 29.6 % — ABNORMAL LOW (ref 36.0–46.0)
Hemoglobin: 9.4 g/dL — ABNORMAL LOW (ref 12.0–15.0)
MCH: 28.9 pg (ref 26.0–34.0)
MCHC: 31.8 g/dL (ref 30.0–36.0)
MCV: 91.1 fL (ref 80.0–100.0)
Platelets: 270 10*3/uL (ref 150–400)
RBC: 3.25 MIL/uL — ABNORMAL LOW (ref 3.87–5.11)
RDW: 14.9 % (ref 11.5–15.5)
WBC: 10.8 10*3/uL — ABNORMAL HIGH (ref 4.0–10.5)
nRBC: 0 % (ref 0.0–0.2)

## 2021-09-05 LAB — RESPIRATORY PANEL BY PCR

## 2021-09-05 LAB — TROPONIN I (HIGH SENSITIVITY): Troponin I (High Sensitivity): 8 ng/L (ref <18)

## 2021-09-05 LAB — CBG MONITORING, ED
Glucose-Capillary: 126 mg/dL — ABNORMAL HIGH (ref 70–99)
Glucose-Capillary: 182 mg/dL — ABNORMAL HIGH (ref 70–99)
Glucose-Capillary: 253 mg/dL — ABNORMAL HIGH (ref 70–99)

## 2021-09-05 LAB — GLUCOSE, CAPILLARY
Glucose-Capillary: 146 mg/dL — ABNORMAL HIGH (ref 70–99)
Glucose-Capillary: 170 mg/dL — ABNORMAL HIGH (ref 70–99)

## 2021-09-05 LAB — D-DIMER, QUANTITATIVE: D-Dimer, Quant: 1.29 ug/mL-FEU — ABNORMAL HIGH (ref 0.00–0.50)

## 2021-09-05 LAB — BRAIN NATRIURETIC PEPTIDE: B Natriuretic Peptide: 245.9 pg/mL — ABNORMAL HIGH (ref 0.0–100.0)

## 2021-09-05 LAB — CREATININE, SERUM
Creatinine, Ser: 1.4 mg/dL — ABNORMAL HIGH (ref 0.44–1.00)
GFR, Estimated: 36 mL/min — ABNORMAL LOW (ref >60)

## 2021-09-05 MED ORDER — MELATONIN 3 MG PO TABS
3.0000 mg | ORAL_TABLET | Freq: Every evening | ORAL | Status: DC | PRN
  Administered 2021-09-06 – 2021-09-08 (×3): 3 mg via ORAL
  Filled 2021-09-05 (×3): qty 1

## 2021-09-05 MED ORDER — BENZONATATE 100 MG PO CAPS
100.0000 mg | ORAL_CAPSULE | Freq: Once | ORAL | Status: AC
  Administered 2021-09-05: 100 mg via ORAL
  Filled 2021-09-05: qty 1

## 2021-09-05 MED ORDER — IPRATROPIUM BROMIDE 0.03 % NA SOLN
1.0000 | Freq: Three times a day (TID) | NASAL | Status: DC | PRN
  Administered 2021-09-06: 1 via NASAL
  Filled 2021-09-05: qty 30

## 2021-09-05 MED ORDER — FUROSEMIDE 10 MG/ML IJ SOLN
20.0000 mg | Freq: Once | INTRAMUSCULAR | Status: AC
  Administered 2021-09-05: 20 mg via INTRAVENOUS
  Filled 2021-09-05: qty 4

## 2021-09-05 MED ORDER — SODIUM CHLORIDE 3 % IN NEBU
4.0000 mL | INHALATION_SOLUTION | Freq: Two times a day (BID) | RESPIRATORY_TRACT | Status: DC
  Filled 2021-09-05: qty 4

## 2021-09-05 MED ORDER — POLYETHYLENE GLYCOL 3350 17 G PO PACK
17.0000 g | PACK | Freq: Two times a day (BID) | ORAL | Status: DC | PRN
  Administered 2021-09-07: 17 g via ORAL
  Filled 2021-09-05: qty 1

## 2021-09-05 MED ORDER — HYDRALAZINE HCL 20 MG/ML IJ SOLN
5.0000 mg | INTRAMUSCULAR | Status: AC
  Administered 2021-09-05: 5 mg via INTRAVENOUS
  Filled 2021-09-05: qty 1

## 2021-09-05 MED ORDER — ACETAMINOPHEN 325 MG PO TABS: 650.0000 mg | ORAL_TABLET | Freq: Four times a day (QID) | ORAL | Status: DC | PRN

## 2021-09-05 MED ORDER — ASPIRIN EC 81 MG PO TBEC
81.0000 mg | DELAYED_RELEASE_TABLET | Freq: Every day | ORAL | Status: DC
  Administered 2021-09-05 – 2021-09-08 (×4): 81 mg via ORAL
  Filled 2021-09-05 (×4): qty 1

## 2021-09-05 MED ORDER — INSULIN ASPART 100 UNIT/ML IJ SOLN
0.0000 [IU] | Freq: Three times a day (TID) | INTRAMUSCULAR | Status: DC
  Administered 2021-09-05: 5 [IU] via SUBCUTANEOUS
  Administered 2021-09-05: 2 [IU] via SUBCUTANEOUS
  Administered 2021-09-05: 1 [IU] via SUBCUTANEOUS
  Administered 2021-09-06 – 2021-09-07 (×6): 2 [IU] via SUBCUTANEOUS
  Filled 2021-09-05: qty 0.09

## 2021-09-05 MED ORDER — LEVOFLOXACIN 500 MG PO TABS: 500.0000 mg | ORAL_TABLET | ORAL | Status: DC

## 2021-09-05 MED ORDER — ONDANSETRON HCL 4 MG/2ML IJ SOLN: 4.0000 mg | Freq: Four times a day (QID) | INTRAMUSCULAR | Status: DC | PRN

## 2021-09-05 MED ORDER — OMEGA-3-ACID ETHYL ESTERS 1 G PO CAPS
1.0000 g | ORAL_CAPSULE | Freq: Two times a day (BID) | ORAL | Status: DC
Start: 2021-09-05 — End: 2021-09-09
  Administered 2021-09-05 – 2021-09-08 (×8): 1 g via ORAL
  Filled 2021-09-05 (×8): qty 1

## 2021-09-05 MED ORDER — IOHEXOL 350 MG/ML SOLN
70.0000 mL | Freq: Once | INTRAVENOUS | Status: AC | PRN
  Administered 2021-09-05: 70 mL via INTRAVENOUS

## 2021-09-05 MED ORDER — VITAMIN D 25 MCG (1000 UNIT) PO TABS
2000.0000 [IU] | ORAL_TABLET | Freq: Every day | ORAL | Status: DC
  Administered 2021-09-05 – 2021-09-08 (×4): 2000 [IU] via ORAL
  Filled 2021-09-05 (×4): qty 2

## 2021-09-05 MED ORDER — LEVOFLOXACIN IN D5W 750 MG/150ML IV SOLN
750.0000 mg | Freq: Once | INTRAVENOUS | Status: AC
  Administered 2021-09-05: 750 mg via INTRAVENOUS
  Filled 2021-09-05: qty 150

## 2021-09-05 MED ORDER — DOXYCYCLINE HYCLATE 100 MG PO TABS
100.0000 mg | ORAL_TABLET | Freq: Once | ORAL | Status: AC
  Administered 2021-09-05: 100 mg via ORAL
  Filled 2021-09-05: qty 1

## 2021-09-05 MED ORDER — AMITRIPTYLINE HCL 25 MG PO TABS
25.0000 mg | ORAL_TABLET | ORAL | Status: DC
  Administered 2021-09-05 – 2021-09-08 (×2): 25 mg via ORAL
  Filled 2021-09-05 (×2): qty 1

## 2021-09-05 MED ORDER — DOCUSATE SODIUM 100 MG PO CAPS
100.0000 mg | ORAL_CAPSULE | Freq: Two times a day (BID) | ORAL | Status: DC
  Administered 2021-09-05 – 2021-09-08 (×8): 100 mg via ORAL
  Filled 2021-09-05 (×8): qty 1

## 2021-09-05 MED ORDER — HYDRALAZINE HCL 20 MG/ML IJ SOLN
5.0000 mg | Freq: Four times a day (QID) | INTRAMUSCULAR | Status: DC | PRN
  Filled 2021-09-05: qty 1

## 2021-09-05 MED ORDER — PROBIOTIC PO TBEC: DELAYED_RELEASE_TABLET | Freq: Every morning | ORAL | Status: DC

## 2021-09-05 MED ORDER — IPRATROPIUM-ALBUTEROL 0.5-2.5 (3) MG/3ML IN SOLN
3.0000 mL | Freq: Four times a day (QID) | RESPIRATORY_TRACT | Status: DC
  Administered 2021-09-05 – 2021-09-08 (×12): 3 mL via RESPIRATORY_TRACT
  Filled 2021-09-05 (×12): qty 3

## 2021-09-05 MED ORDER — PREDNISONE 20 MG PO TABS
40.0000 mg | ORAL_TABLET | Freq: Once | ORAL | Status: AC
  Administered 2021-09-05: 40 mg via ORAL
  Filled 2021-09-05: qty 2

## 2021-09-05 MED ORDER — METHYLPREDNISOLONE SODIUM SUCC 40 MG IJ SOLR
40.0000 mg | Freq: Two times a day (BID) | INTRAMUSCULAR | Status: DC
  Administered 2021-09-05 – 2021-09-06 (×3): 40 mg via INTRAVENOUS
  Filled 2021-09-05 (×3): qty 1

## 2021-09-05 MED ORDER — POLYVINYL ALCOHOL 1.4 % OP SOLN: 1.0000 [drp] | Freq: Three times a day (TID) | OPHTHALMIC | Status: DC | PRN

## 2021-09-05 MED ORDER — AMLODIPINE BESYLATE 10 MG PO TABS
10.0000 mg | ORAL_TABLET | Freq: Every day | ORAL | Status: DC
  Administered 2021-09-05 – 2021-09-08 (×4): 10 mg via ORAL
  Filled 2021-09-05: qty 2
  Filled 2021-09-05 (×3): qty 1

## 2021-09-05 MED ORDER — GUAIFENESIN ER 600 MG PO TB12
600.0000 mg | ORAL_TABLET | Freq: Two times a day (BID) | ORAL | Status: DC
  Administered 2021-09-05 – 2021-09-06 (×3): 600 mg via ORAL
  Filled 2021-09-05 (×3): qty 1

## 2021-09-05 MED ORDER — ALBUTEROL SULFATE (2.5 MG/3ML) 0.083% IN NEBU
2.5000 mg | INHALATION_SOLUTION | Freq: Once | RESPIRATORY_TRACT | Status: AC
  Administered 2021-09-05: 2.5 mg via RESPIRATORY_TRACT
  Filled 2021-09-05: qty 3

## 2021-09-05 MED ORDER — BISOPROLOL FUMARATE 5 MG PO TABS
5.0000 mg | ORAL_TABLET | Freq: Every day | ORAL | Status: DC
  Administered 2021-09-05 – 2021-09-08 (×4): 5 mg via ORAL
  Filled 2021-09-05 (×5): qty 1

## 2021-09-05 MED ORDER — SODIUM CHLORIDE (PF) 0.9 % IJ SOLN
INTRAMUSCULAR | Status: AC
  Filled 2021-09-05: qty 50

## 2021-09-05 MED ORDER — ADULT MULTIVITAMIN W/MINERALS CH
1.0000 | ORAL_TABLET | Freq: Every day | ORAL | Status: DC
  Administered 2021-09-05 – 2021-09-08 (×4): 1 via ORAL
  Filled 2021-09-05 (×4): qty 1

## 2021-09-05 MED ORDER — ATORVASTATIN CALCIUM 10 MG PO TABS
20.0000 mg | ORAL_TABLET | Freq: Every day | ORAL | Status: DC
  Administered 2021-09-05 – 2021-09-08 (×4): 20 mg via ORAL
  Filled 2021-09-05 (×4): qty 2

## 2021-09-05 MED ORDER — POLYETHYL GLYCOL-PROPYL GLYCOL 0.4-0.3 % OP SOLN
1.0000 [drp] | Freq: Three times a day (TID) | OPHTHALMIC | Status: DC | PRN
Start: 2021-09-05 — End: 2021-09-05

## 2021-09-05 MED ORDER — ENOXAPARIN SODIUM 30 MG/0.3ML IJ SOSY
30.0000 mg | PREFILLED_SYRINGE | INTRAMUSCULAR | Status: DC
  Administered 2021-09-05 – 2021-09-08 (×4): 30 mg via SUBCUTANEOUS
  Filled 2021-09-05 (×4): qty 0.3

## 2021-09-05 MED ORDER — RISAQUAD PO CAPS
1.0000 | ORAL_CAPSULE | Freq: Every day | ORAL | Status: DC
  Administered 2021-09-05 – 2021-09-08 (×4): 1 via ORAL
  Filled 2021-09-05 (×5): qty 1

## 2021-09-05 MED ORDER — SODIUM CHLORIDE 3 % IN NEBU
4.0000 mL | INHALATION_SOLUTION | Freq: Two times a day (BID) | RESPIRATORY_TRACT | Status: AC
  Administered 2021-09-05 – 2021-09-07 (×4): 4 mL via RESPIRATORY_TRACT
  Filled 2021-09-05 (×4): qty 4

## 2021-09-05 MED ORDER — DOXYCYCLINE HYCLATE 100 MG PO TABS
100.0000 mg | ORAL_TABLET | Freq: Two times a day (BID) | ORAL | Status: DC
  Administered 2021-09-05 – 2021-09-08 (×8): 100 mg via ORAL
  Filled 2021-09-05 (×9): qty 1

## 2021-09-05 MED ORDER — INSULIN ASPART 100 UNIT/ML IJ SOLN
0.0000 [IU] | Freq: Every day | INTRAMUSCULAR | Status: DC
  Administered 2021-09-06 – 2021-09-08 (×2): 2 [IU] via SUBCUTANEOUS
  Filled 2021-09-05: qty 0.05

## 2021-09-05 NOTE — Evaluation (Signed)
Physical Therapy Evaluation ?Patient Details ?Name: Stacey Armstrong ?MRN: [id number] ?DOB: Oct 31, 1927 ?Today's Date: 09/05/2021 ? ?History of Present Illness ? 88 y.o. female with medical history significant for type 2 diabetes, former tobacco user(quit 23 years ago), history of DVT after hysterectomy off anticoagulation, CKD 4, GERD, TIA, hyperlipidemia, hypertension, OSA, urolithiasis post cystoscopy on 08/14/2021, right ureteral stent placement due to right obstructing UPJ urolith, recently admitted for AKI secondary to dehydration and discharged on 08/22/2021 who presents from home to [facility] ED with complaints of worsening productive cough and nasal congestion. Dx of bronchitis.  ?Clinical Impression ? Pt admitted with above diagnosis. Pt ambulated 110' with rollator without loss of balance, 2/4 dyspnea, SaO2 91% on room air. She reports she had a fall a few days ago ("I tripped over my own feet"), and one other fall in past 6 months. HHPT recommended for balance training.  She is mobilizing well enough to return to her ILF. Pt currently with functional limitations due to the deficits listed below (see PT Problem List). Pt will benefit from skilled PT to increase their independence and safety with mobility to allow discharge to the venue listed below.   ?   ?   ? ?Recommendations for follow up therapy are one component of a multi-disciplinary discharge planning process, led by the attending physician.  Recommendations may be updated based on patient status, additional functional criteria and insurance authorization. ? ?Follow Up Recommendations Home health PT ? ?  ?Assistance Recommended at Discharge Set up Supervision/Assistance  ?Patient can return home with the following ? Assist for transportation;Assistance with cooking/housework;Help with stairs or ramp for entrance ? ?  ?Equipment Recommendations None recommended by PT  ?Recommendations for Other Services ?    ?  ?Functional Status Assessment Patient has had a  recent decline in their functional status and demonstrates the ability to make significant improvements in function in a reasonable and predictable amount of time.  ? ?  ?Precautions / Restrictions Precautions ?Precautions: Fall ?Precaution Comments: 2 falls in past 6 months ("I tripped over my own feet") ?Restrictions ?Weight Bearing Restrictions: No  ? ?  ? ?Mobility ? Bed Mobility ?Overal bed mobility: Needs Assistance ?Bed Mobility: Supine to Sit ?  ?  ?Supine to sit: Min assist ?  ?  ?General bed mobility comments: min A to raise trunk ?  ? ?Transfers ?Overall transfer level: Needs assistance ?Equipment used: Rollator (4 wheels) ?Transfers: Sit to/from Stand ?Sit to Stand: Min guard ?  ?  ?  ?  ?  ?General transfer comment: min/guard for safety due to recent fall ?  ? ?Ambulation/Gait ?Ambulation/Gait assistance: Min guard ?Gait Distance (Feet): 110 Feet ?Assistive device: Rollator (4 wheels) ?Gait Pattern/deviations: Step-through pattern, Decreased stride length ?Gait velocity: WFL ?  ?  ?General Gait Details: steady, no loss of balance, 2/4 dyspnea, SaO2 91% on room air ? ?Stairs ?  ?  ?  ?  ?  ? ?Wheelchair Mobility ?  ? ?Modified Rankin (Stroke Patients Only) ?  ? ?  ? ?Balance Overall balance assessment: History of Falls, Needs assistance ?Sitting-balance support: No upper extremity supported, Feet unsupported ?Sitting balance-Leahy Scale: Good ?  ?  ?Standing balance support: Bilateral upper extremity supported, During functional activity, Reliant on assistive device for balance ?Standing balance-Leahy Scale: Poor ?  ?  ?  ?  ?  ?  ?  ?  ?  ?  ?  ?  ?   ? ? ? ?Pertinent Vitals/Pain Pain Assessment ?Pain  Assessment: No/denies pain  ? ? ?Home Living Family/patient expects to be discharged to:: Private residence ?Living Arrangements: Alone ?Available Help at Discharge: Family;Available PRN/intermittently ?Type of Home: Independent living facility ?Home Access: Level entry ?  ?  ?  ?Home Layout: One  level ?Home Equipment: Rollator (4 wheels) ?   ?  ?Prior Function Prior Level of Function : Independent/Modified Independent ?  ?  ?  ?  ?  ?  ?Mobility Comments: walks with rollator, 2 falls in past 6 months ?ADLs Comments: independent ?  ? ? ?Hand Dominance  ?   ? ?  ?Extremity/Trunk Assessment  ? Upper Extremity Assessment ?Upper Extremity Assessment: Defer to OT evaluation ?  ? ?Lower Extremity Assessment ?Lower Extremity Assessment: Overall WFL for tasks assessed ?  ? ?Cervical / Trunk Assessment ?Cervical / Trunk Assessment: Normal  ?Communication  ? Communication: HOH  ?Cognition Arousal/Alertness: Awake/alert ?Behavior During Therapy: [facility] for tasks assessed/performed ?Overall Cognitive Status: Within Functional Limits for tasks assessed ?  ?  ?  ?  ?  ?  ?  ?  ?  ?  ?  ?  ?  ?  ?  ?  ?  ?  ?  ? ?  ?General Comments   ? ?  ?Exercises    ? ?Assessment/Plan  ?  ?PT Assessment Patient needs continued PT services  ?PT Problem List Decreased activity tolerance;Decreased balance ? ?   ?  ?PT Treatment Interventions Gait training;Therapeutic exercise;Patient/family education;Therapeutic activities   ? ?PT Goals (Current goals can be found in the Care Plan section)  ?Acute Rehab PT Goals ?Patient Stated Goal: return to ILF ?PT Goal Formulation: With patient/family ?Time For Goal Achievement: 09/19/21 ?Potential to Achieve Goals: Good ? ?  ?Frequency Min 3X/week ?  ? ? ?Co-evaluation PT/OT/SLP Co-Evaluation/Treatment: Yes ?Reason for Co-Treatment: For patient/therapist safety ?PT goals addressed during session: Mobility/safety with mobility;Balance;Proper use of DME ?  ?  ? ? ?  ?AM-PAC PT "6 Clicks" Mobility  ?Outcome Measure Help needed turning from your back to your side while in a flat bed without using bedrails?: None ?Help needed moving from lying on your back to sitting on the side of a flat bed without using bedrails?: A Little ?Help needed moving to and from a bed to a chair (including a wheelchair)?:  None ?Help needed standing up from a chair using your arms (e.g., wheelchair or bedside chair)?: None ?Help needed to walk in hospital room?: None ?Help needed climbing 3-5 steps with a railing? : A Little ?6 Click Score: 22 ? ?  ?End of Session   ?Activity Tolerance: Patient tolerated treatment well ?Patient left: in bed;with call bell/phone within reach;with family/visitor present ?Nurse Communication: Mobility status ?PT Visit Diagnosis: History of falling (Z91.81);Difficulty in walking, not elsewhere classified (R26.2) ?  ? ?Time: 5364-6803 ?PT Time Calculation (min) (ACUTE ONLY): 16 min ? ? ?Charges:   PT Evaluation ?$PT Eval Moderate Complexity: 1 Mod ?  ?  ?   ? ?Blondell Reveal Kistler PT 09/05/2021  ?Acute Rehabilitation Services ?Pager [phone] ?Office [phone] ? ? ?

## 2021-09-05 NOTE — Progress Notes (Signed)
Occupational Therapy Evaluation ? ?Patient resides in ILF and is mod I at baseline with self care tasks and ambulation with rollator. Patient overall min G assist level for out of bed activity this date for safety, presents with decreased activity tolerance. Anticipate patient will progress well during acute hospitalization with no OT needs at D/C. Acute OT to follow. ? ? ? 09/05/21 1300  ?OT Visit Information  ?Last OT Received On 09/05/21  ?Assistance Needed +1  ?PT/OT/SLP Co-Evaluation/Treatment Yes  ?Reason for Co-Treatment To address functional/ADL transfers;For patient/therapist safety  ?PT goals addressed during session Mobility/safety with mobility  ?OT goals addressed during session ADL's and self-care  ?History of Present Illness 88 y.o. female with medical history significant for type 2 diabetes, former tobacco user(quit 23 years ago), history of DVT after hysterectomy off anticoagulation, CKD 4, GERD, TIA, hyperlipidemia, hypertension, OSA, urolithiasis post cystoscopy on 08/14/2021, right ureteral stent placement due to right obstructing UPJ urolith, recently admitted for AKI secondary to dehydration and discharged on 08/22/2021 who presents from home to [facility] ED with complaints of worsening productive cough and nasal congestion. Dx of bronchitis.  ?Precautions  ?Precautions Fall  ?Precaution Comments 2 falls in past 6 months ("I tripped over my own feet")  ?Restrictions  ?Weight Bearing Restrictions No  ?Home Living  ?Family/patient expects to be discharged to: Private residence  ?Living Arrangements Alone  ?Available Help at Discharge Family;Available PRN/intermittently  ?Type of Home Independent living facility  ?Home Access Level entry  ?Home Layout One level  ?Bathroom Shower/Tub Walk-in shower  ?Bathroom Toilet Handicapped height  ?Home Equipment Rollator (4 wheels)  ?Prior Function  ?Prior Level of Function  Independent/Modified Independent  ?Mobility Comments walks with rollator, 2 falls in past 6  months  ?ADLs Comments independent  ?Communication  ?Communication HOH  ?Pain Assessment  ?Pain Assessment No/denies pain  ?Cognition  ?Arousal/Alertness Awake/alert  ?Behavior During Therapy [facility] for tasks assessed/performed  ?Overall Cognitive Status Within Functional Limits for tasks assessed  ?Upper Extremity Assessment  ?Upper Extremity Assessment Overall WFL for tasks assessed  ?Lower Extremity Assessment  ?Lower Extremity Assessment Defer to PT evaluation  ?Cervical / Trunk Assessment  ?Cervical / Trunk Assessment Normal  ?ADL  ?Overall ADL's  Needs assistance/impaired  ?Grooming Set up;Sitting  ?Upper Body Bathing Set up;Supervision/ safety;Sitting  ?Lower Body Bathing Sit to/from stand;Sitting/lateral leans;Min guard  ?Upper Body Dressing  Set up;Sitting  ?Lower Body Dressing Sit to/from stand;Sitting/lateral leans;Min guard  ?Surveyor, minerals guard;Ambulation;Rollator (4 wheels)  ?Toilet Transfer Details (indicate cue type and reason) Patient able to ambulate in hallway with rollator at min G level  ?Toileting- Music therapist guard;Sit to/from stand;Sitting/lateral lean  ?Functional mobility during ADLs Min guard;Rollator (4 wheels)  ?General ADL Comments Patient appears close to her baseline, min G for safety with functional ambulation. Decreased activity tolerance  ?Bed Mobility  ?Overal bed mobility Needs Assistance  ?Bed Mobility Supine to Sit  ?Supine to sit Min assist  ?General bed mobility comments min A to raise trunk  ?Balance  ?Overall balance assessment History of Falls;Needs assistance  ?Sitting-balance support No upper extremity supported;Feet unsupported  ?Sitting balance-Leahy Scale Good  ?Standing balance support Reliant on assistive device for balance  ?Standing balance-Leahy Scale Poor  ?General Comments  ?General comments (skin integrity, edema, etc.) O2 saturations stable on room air with activity  ?OT - End of Session  ?Equipment Utilized During [employer] (4 wheels)  ?Activity Tolerance Patient tolerated treatment well  ?Patient left in  bed;with call bell/phone within reach;with family/visitor present  ?Nurse Communication Mobility status  ?OT Assessment  ?OT Recommendation/Assessment Patient needs continued OT Services  ?OT Visit Diagnosis History of falling (Z91.81)  ?OT Problem List Decreased activity tolerance;Impaired balance (sitting and/or standing)  ?OT Plan  ?OT Frequency (ACUTE ONLY) Min 2X/week  ?OT Treatment/Interventions (ACUTE ONLY) Self-care/ADL training;Balance training;Patient/family education;Therapeutic activities  ?AM-PAC OT "6 Clicks" Daily Activity Outcome Measure (Version 2)  ?Help from another person eating meals? 4  ?Help from another person taking care of personal grooming? 3  ?Help from another person toileting, which includes using toliet, bedpan, or urinal? 3  ?Help from another person bathing (including washing, rinsing, drying)? 3  ?Help from another person to put on and taking off regular upper body clothing? 3  ?Help from another person to put on and taking off regular lower body clothing? 3  ?6 Click Score 19  ?Progressive Mobility  ?What is the highest level of mobility based on the progressive mobility assessment? Level 5 (Walks with assist in room/hall) - Balance while stepping forward/back and can walk in room with assist - Complete  ?Activity Ambulated with assistance in hallway  ?OT Recommendation  ?Follow Up Recommendations No OT follow up  ?Assistance recommended at discharge PRN  ?Functional Status Assessent Patient has had a recent decline in their functional status and demonstrates the ability to make significant improvements in function in a reasonable and predictable amount of time.  ?OT Equipment None recommended by OT  ?Individuals Consulted  ?Consulted and Agree with Results and Recommendations Patient  ?Acute Rehab OT Goals  ?Patient Stated Goal back to ILF  ?OT Goal Formulation With patient  ?Time For Goal  Achievement 09/19/21  ?Potential to Achieve Goals Good  ?OT Time Calculation  ?OT Start Time (ACUTE ONLY) 1040  ?OT Stop Time (ACUTE ONLY) 1055  ?OT Time Calculation (min) 15 min  ?OT General Charges  ?$OT Visit 1 Visit  ?OT Evaluation  ?$OT Eval Low Complexity 1 Low  ? ?Delbert Phenix OT ?OT pager: [phone] ? ?

## 2021-09-05 NOTE — H&P (Addendum)
?History and Physical ? ?Arion AJGOTL [id number]:[id number] DOB: 1927/07/05 DOA: 09/04/2021 ? ?Referring physician: Dr. Ralene Bathe, [place]  ?PCP: [name], Rayford Halsted, MD  ?Outpatient Specialists: None ?Patient coming from: Home ? ?Chief Complaint: Cough and chest congestion  ? ?HPI: Stacey Armstrong is a 88 y.o. female with medical history significant for type 2 diabetes, former tobacco user(quit 23 years ago), history of DVT after hysterectomy off anticoagulation, CKD 4, GERD, TIA, hyperlipidemia, hypertension, OSA, urolithiasis post cystoscopy on 08/14/2021, right ureteral stent placement due to right obstructing UPJ urolith, recently admitted for AKI secondary to dehydration and discharged on 08/22/2021 who presents from home to [facility] ED with complaints of worsening productive cough and nasal congestion.  She went on a trip to [place] and [place] and returned home yesterday.  After returning home she had worsening productive cough and wheezing.  Also had bilateral lower extremity edema which has now improved after IV Lasix administered in the ED.  She received albuterol neb treatment x1, IV Lasix 20 mg x 1 and 200 mg Tessalon Perles x1.  Due to elevated D-dimer CTA chest was done which was negative for pulmonary embolism.  EDP requesting admission for further management of her symptomatology.  Admitted by the hospitalist service, [facility]. ? ?ED Course: Tmax 99.  BP 165/66, pulse 70, respiratory rate 19, O2 saturation 96% on room air.  Lab studies remarkable for creatinine 1.55 and GFR of 31 at baseline, hemoglobin 9.7, MCV 90, BNP 245. ? ?Review of Systems: ?Review of systems as noted in the HPI. All other systems reviewed and are negative. ? ? ?Past Medical History:  ?Diagnosis Date  ? Arthritis   ? Complication of anesthesia   ? after gallbladder surgery with extubation had shallow respirations,  needed to continue intudation for additonal 10-12 days  ? Diabetes ([place])   ? DVT (deep venous thrombosis) ([place])   ? left leg after  hysterectomy  ? GERD (gastroesophageal reflux disease)   ? History of COVID-19 05/2021  ? History of kidney stones   ? History of transient ischemic attack (TIA)   ? HOH (hard of hearing)   ? Hyperlipidemia   ? Hypertension   ? Sleep apnea   ? ?Past Surgical History:  ?Procedure Laterality Date  ? ABDOMINAL HYSTERECTOMY    ? APPENDECTOMY    ? CATARACT EXTRACTION, BILATERAL    ? CHOLECYSTECTOMY    ? COLONOSCOPY    ? CYSTOSCOPY WITH STENT PLACEMENT Right 08/14/2021  ? Procedure: CYSTOSCOPY WITH STENT PLACEMENT;  Surgeon: Ardis Hughs, MD;  Location: [facility] ORS;  Service: Urology;  Laterality: Right;  ONLY NEEDS 30 MIN  ? ? ?Social History:  reports that she has quit smoking. She has never used smokeless tobacco. She reports that she does not currently use alcohol. She reports that she does not use drugs. ? ? ?Allergies  ?Allergen Reactions  ? Coconut (Cocos Nucifera) Allergy Skin Test Hives  ? Penicillins Hives  ? Bactrim [Sulfamethoxazole-Trimethoprim] Nausea And Vomiting  ? Other Hives  ?  Berries   ? ? ?Family History  ?Problem Relation Age of Onset  ? CAD Maternal Grandmother   ? Pneumonia Maternal Grandfather   ?  ? ? ?Prior to Admission medications   ?Medication Sig Start Date End Date Taking? Authorizing Provider  ?amitriptyline (ELAVIL) 25 MG tablet Take 1 tablet every third night ?Patient taking differently: Take 25 mg by mouth See admin instructions. Take 25mg  by mouth every third night 08/26/21  Yes [name], Rayford Halsted,  MD  ?amLODipine (NORVASC) 10 MG tablet Take 1 tablet (10 mg total) by mouth daily. 08/26/21 12/24/21 Yes Erline Hau, MD  ?aspirin EC 81 MG tablet Take 81 mg by mouth in the morning.   Yes [provider]  ?atorvastatin (LIPITOR) 20 MG tablet Take 1 tablet (20 mg total) by mouth daily. ?Patient taking differently: Take 20 mg by mouth at bedtime. 08/26/21  Yes [name], Rayford Halsted, MD  ?bisoprolol (ZEBETA) 5 MG tablet Take 1 tablet (5 mg total) by mouth daily.  08/26/21  Yes [name], Rayford Halsted, MD  ?Cholecalciferol (VITAMIN D3) 50 MCG (2000 UT) TABS Take 2,000 Units by mouth in the morning.   Yes [provider]  ?docusate sodium (COLACE) 100 MG capsule Take 1 capsule (100 mg total) by mouth 2 (two) times daily. 12/30/18  Yes [name], Rayford Halsted, MD  ?Multiple Vitamin (MULTIVITAMIN WITH MINERALS) TABS tablet Take 1 tablet by mouth daily. Centrum Silver   Yes [provider]  ?mupirocin ointment (BACTROBAN) 2 % Apply 1 application. topically daily as needed (nose rawness.).   Yes [provider]  ?Omega-3 Fatty Acids (FISH OIL) 1000 MG CAPS Take 1,000 mg by mouth 2 (two) times daily with a meal.   Yes [provider]  ?Polyethyl Glycol-Propyl Glycol (SYSTANE ULTRA) 0.4-0.3 % SOLN Place 1-2 drops into both eyes 3 (three) times daily as needed (dry/irritated eyes.).   Yes [provider]  ?polyethylene glycol powder (GLYCOLAX/MIRALAX) 17 GM/SCOOP powder Take 17 g by mouth 2 (two) times daily as needed. ?Patient taking differently: Take 17 g by mouth in the morning. 12/30/18  Yes [name], Rayford Halsted, MD  ?Probiotic Product (PROBIOTIC PO) Take 1 capsule by mouth in the morning.   Yes [provider]  ?traMADol (ULTRAM) 50 MG tablet Take 50-100 mg by mouth every 6 (six) hours as needed for severe pain. 08/13/21  Yes [provider]  ?Alcohol Swabs (B-D SINGLE USE SWABS REGULAR) PADS USE AS DIRECTED EVERY DAY 09/11/20   [name], Rayford Halsted, MD  ?Blood Glucose Monitoring Suppl (TRUE METRIX METER) DEVI 1 each by Does not apply route daily. 09/28/20   [name], Rayford Halsted, MD  ?glucose blood (TRUE METRIX BLOOD GLUCOSE TEST) test strip 1 each by Other route daily. Dx E11.9 10/03/20   [name], Rayford Halsted, MD  ?ipratropium (ATROVENT) 0.03 % nasal spray Place 1 spray into both nostrils 3 (three) times daily as needed (congestion/allergies.). 01/22/21   [provider]  ?TRUEplus Lancets  33G MISC TEST BLOOD SUGAR EVERY DAY 07/03/21   [name], Rayford Halsted, MD  ? ? ?Physical Exam: ?BP (!) 165/66   Pulse 72   Temp 99 ?F (37.2 ?C) (Oral)   Resp 19   Ht $R'5\' 3"'Jq$  (1.6 m)   Wt 64.5 kg   SpO2 92%   BMI 25.18 kg/m?  ? ?General: 88 y.o. year-old female well developed well nourished in no acute distress.  Alert and oriented x3. ?Cardiovascular: Regular rate and rhythm with no rubs or gallops.  No thyromegaly or JVD noted.  Trace lower extremity edema.  ?Respiratory: Diffuse wheezing bilaterally.  Mild rales at bases.  Poor inspiratory effort. ?Abdomen: Soft nontender nondistended with normal bowel sounds x4 quadrants. ?Muskuloskeletal: No cyanosis or clubbing.  Trace edema noted bilaterally ?Neuro: CN II-XII intact, strength, sensation, reflexes ?Skin: No ulcerative lesions noted or rashes ?Psychiatry: Judgement and insight appear normal. Mood is appropriate for condition and setting ?   ?   ?   ?  Labs on Admission:  ?Basic Metabolic Panel: ?Recent Labs  ?Lab 09/04/21 ?1916  ?NA 137  ?K 4.3  ?CL 105  ?CO2 26  ?GLUCOSE 134*  ?BUN 24*  ?CREATININE 1.55*  ?CALCIUM 9.0  ? ?Liver Function Tests: ?Recent Labs  ?Lab 09/04/21 ?1916  ?AST 19  ?ALT 14  ?ALKPHOS 74  ?BILITOT 0.8  ?PROT 6.6  ?ALBUMIN 3.2*  ? ?No results for input(s): LIPASE, AMYLASE in the last 168 hours. ?No results for input(s): AMMONIA in the last 168 hours. ?CBC: ?Recent Labs  ?Lab 09/04/21 ?1916  ?WBC 10.0  ?NEUTROABS 7.4  ?HGB 9.7*  ?HCT 30.2*  ?MCV 90.7  ?PLT 308  ? ?Cardiac Enzymes: ?No results for input(s): CKTOTAL, CKMB, CKMBINDEX, TROPONINI in the last 168 hours. ? ?BNP (last 3 results) ?Recent Labs  ?  09/04/21 ?1916  ?BNP 245.9*  ? ? ?ProBNP (last 3 results) ?No results for input(s): PROBNP in the last 8760 hours. ? ?CBG: ?Recent Labs  ?Lab 09/05/21 ?0448  ?GLUCAP 126*  ? ? ?Radiological Exams on Admission: ?DG Chest 2 View ? ?Result Date: 09/04/2021 ?CLINICAL DATA:  Cough and runny nose. EXAM: CHEST - 2 VIEW COMPARISON:  None.  FINDINGS: The lungs are hyperinflated. Diffuse, chronic appearing increased interstitial lung markings are seen. Mild atelectasis is noted within the left lung base. There is no evidence of a pleural effusion or pneumoth

## 2021-09-05 NOTE — Progress Notes (Signed)
PHARMACY NOTE:  ANTIMICROBIAL RENAL DOSAGE ADJUSTMENT ? ?Current antimicrobial regimen includes a mismatch between antimicrobial dosage and estimated renal function.  As per policy approved by the Pharmacy & Therapeutics and Medical Executive Committees, the antimicrobial dosage will be adjusted accordingly. ? ?Current antimicrobial dosage:  Levaquin $RemoveB'750mg'hovADayr$  IV q24h x 3 days ? ?Indication: COPD exacerbation ? ?Renal Function: ? ?Estimated Creatinine Clearance: 23.6 mL/min (A) (by C-G formula based on SCr of 1.4 mg/dL (H)). ?$RemoveBefo'[]'vkyyCsLvuYY$      On intermittent HD, scheduled: ?$RemoveBeforeD'[]'EWRALauAGttBAU$      On CRRT ?   ?Antimicrobial dosage has been changed to:  Levaquin $RemoveB'750mg'oaRZzMjd$  IV x1 then $Remov'500mg'WrSZuV$  q48h for total of 3 days ? ?Additional comments: ? ? ?Thank you for allowing pharmacy to be a part of this patient's care. ? ?Netta Cedars, [facility] ?09/05/2021 6:09 AM ? ? ?   ?

## 2021-09-05 NOTE — ED Notes (Signed)
Patient transported to CT 

## 2021-09-05 NOTE — Progress Notes (Signed)
Pt continues pulse ox monitor was beeping for low SpO2 in the high 80s to low 90s while patient was sleeping. 2L via [place] of supplemental O2 applied with good response. SpO2 82% with application. ? ?Pt does have wheezing bilaterally. Provider on the unit to address other concern notified face to face regarding this.  ? ?Will call respiratory therapist for PRN neb treatment. ? ?

## 2021-09-05 NOTE — Progress Notes (Signed)
?  PROGRESS NOTE ? ?Patient admitted earlier this morning. See H&P.  ? ?Patient admitted with viral URI, bronchitis. Respiratory viral panel +Rhinovirus.  Patient seen and examined in the emergency department, has bilateral expiratory wheezes.  She is stable on room air. ? ?Continue Solu-Medrol, breathing treatments.  I do not think she needs both doxycycline and Levaquin.  She will need home health PT.   ? ? ? ?Status is: Observation ?The patient will require care spanning > 2 midnights and should be moved to inpatient because: Still having significant wheezes. On IV solumedrol.  ? ? ?Dessa Phi, DO ?[facility] ?09/05/2021, 12:04 PM ? ?Available via Epic secure chat 7am-7pm ?After these hours, please refer to coverage provider listed on [email or web address]  ? ?

## 2021-09-06 DIAGNOSIS — N184 Chronic kidney disease, stage 4 (severe): Secondary | ICD-10-CM | POA: Diagnosis present

## 2021-09-06 DIAGNOSIS — R531 Weakness: Secondary | ICD-10-CM

## 2021-09-06 DIAGNOSIS — R911 Solitary pulmonary nodule: Secondary | ICD-10-CM | POA: Diagnosis present

## 2021-09-06 DIAGNOSIS — J208 Acute bronchitis due to other specified organisms: Secondary | ICD-10-CM | POA: Diagnosis present

## 2021-09-06 LAB — BASIC METABOLIC PANEL
Anion gap: 9 (ref 5–15)
BUN: 30 mg/dL — ABNORMAL HIGH (ref 8–23)
CO2: 24 mmol/L (ref 22–32)
Calcium: 9 mg/dL (ref 8.9–10.3)
Chloride: 101 mmol/L (ref 98–111)
Creatinine, Ser: 1.7 mg/dL — ABNORMAL HIGH (ref 0.44–1.00)
GFR, Estimated: 28 mL/min — ABNORMAL LOW (ref >60)
Glucose, Bld: 208 mg/dL — ABNORMAL HIGH (ref 70–99)
Potassium: 3.9 mmol/L (ref 3.5–5.1)
Sodium: 134 mmol/L — ABNORMAL LOW (ref 135–145)

## 2021-09-06 LAB — GLUCOSE, CAPILLARY
Glucose-Capillary: 176 mg/dL — ABNORMAL HIGH (ref 70–99)
Glucose-Capillary: 165 mg/dL — ABNORMAL HIGH (ref 70–99)
Glucose-Capillary: 185 mg/dL — ABNORMAL HIGH (ref 70–99)
Glucose-Capillary: 244 mg/dL — ABNORMAL HIGH (ref 70–99)

## 2021-09-06 LAB — CBC
HCT: 27.5 % — ABNORMAL LOW (ref 36.0–46.0)
Hemoglobin: 8.9 g/dL — ABNORMAL LOW (ref 12.0–15.0)
MCH: 29.1 pg (ref 26.0–34.0)
MCHC: 32.4 g/dL (ref 30.0–36.0)
MCV: 89.9 fL (ref 80.0–100.0)
Platelets: 285 10*3/uL (ref 150–400)
RBC: 3.06 MIL/uL — ABNORMAL LOW (ref 3.87–5.11)
RDW: 14.8 % (ref 11.5–15.5)
WBC: 8.5 10*3/uL (ref 4.0–10.5)
nRBC: 0 % (ref 0.0–0.2)

## 2021-09-06 LAB — MAGNESIUM: Magnesium: 2.1 mg/dL (ref 1.7–2.4)

## 2021-09-06 MED ORDER — METHYLPREDNISOLONE SODIUM SUCC 40 MG IJ SOLR
40.0000 mg | Freq: Two times a day (BID) | INTRAMUSCULAR | Status: AC
  Administered 2021-09-06: 40 mg via INTRAVENOUS
  Filled 2021-09-06: qty 1

## 2021-09-06 MED ORDER — GUAIFENESIN-DM 100-10 MG/5ML PO SYRP
5.0000 mL | ORAL_SOLUTION | ORAL | Status: DC | PRN
  Administered 2021-09-06 – 2021-09-08 (×5): 5 mL via ORAL
  Filled 2021-09-06 (×5): qty 5

## 2021-09-06 MED ORDER — PREDNISONE 20 MG PO TABS
40.0000 mg | ORAL_TABLET | Freq: Every day | ORAL | Status: DC
Start: 2021-09-07 — End: 2021-09-09
  Administered 2021-09-07 – 2021-09-09 (×3): 40 mg via ORAL
  Filled 2021-09-06 (×3): qty 2

## 2021-09-06 MED ORDER — SALINE SPRAY 0.65 % NA SOLN
1.0000 | NASAL | Status: DC | PRN
  Administered 2021-09-06: 1 via NASAL
  Filled 2021-09-06: qty 44

## 2021-09-06 MED ORDER — ALBUTEROL SULFATE (2.5 MG/3ML) 0.083% IN NEBU: 2.5000 mg | INHALATION_SOLUTION | RESPIRATORY_TRACT | Status: DC | PRN

## 2021-09-06 NOTE — Progress Notes (Addendum)
?PROGRESS NOTE ? ? ? ?Stacey Armstrong DGLOVF  IEP:[id number] DOB: 1928/01/24 DOA: 09/04/2021 ?PCP: [name], Rayford Halsted, MD  ? ?  ?Brief Narrative:  ?Stacey Armstrong is a 88 y.o. female with medical history significant for type 2 diabetes, former tobacco user (quit 23 years ago), history of DVT after hysterectomy off anticoagulation, CKD 4, GERD, TIA, hyperlipidemia, hypertension, OSA, urolithiasis post cystoscopy on 08/14/2021, right ureteral stent placement due to right obstructing UPJ urolith, recently admitted for AKI secondary to dehydration and discharged on 08/22/2021 who presents from home to [facility] ED with complaints of worsening productive cough and nasal congestion.  She went on a trip to [place] and [place] and returned home 4/11.  After returning home she had worsening productive cough and wheezing.  Also had bilateral lower extremity edema which has now improved after IV Lasix administered in the ED.  She received albuterol neb treatment x1, IV Lasix 20 mg x 1 and 200 mg Tessalon Perles x1.  Due to elevated D-dimer CTA chest was done which was negative for pulmonary embolism.  Patient tested positive for rhinovirus on respiratory viral panel.  Patient was admitted for viral bronchitis. ? ?New events last 24 hours / Subjective: ?Continues to have productive cough with green sputum, complains of nasal congestion.  She was put on 2 L of oxygen yesterday due to SPO2 of high 80 to low 90s while sleeping.  Oxygen saturation remain appropriate with 2 L oxygen which can be weaned down today. ? ?Assessment & Plan: ? Principal Problem: ?  Acute viral bronchitis ?Active Problems: ?  Type 2 diabetes mellitus with hyperlipidemia ([place]) ?  Essential hypertension ?  Bronchitis ?  CKD (chronic kidney disease) stage 4, GFR 15-29 ml/min (HCC) ?  Pulmonary nodule ?  Weakness ? ? ?Acute viral bronchitis ?-Continue breathing treatments, cough medication, Solu-Medrol, doxycycline  ?-Wean Solu-Medrol to prednisone in the  morning ? ?Prediabetes ?-A1c 6.0 ?-Sliding scale insulin ? ?Hypertension ?-Norvasc, bisoprolol  ? ?Hyperlipidemia ?-Continue Lipitor ? ?CKD stage 4 ?-Baseline creatinine 1.4-1.7 ?-Stable ? ?Pulmonary nodules seen on CT ?-Follow-up outpatient ? ?Weakness ?-PT OT recommending home health for discharge ? ?DVT prophylaxis:  ?enoxaparin (LOVENOX) injection 30 mg Start: 09/05/21 1000 ? ?Code Status: Full code ?Family Communication: No family at bedside ?Disposition Plan:  ?Status is: Inpatient ?Remains inpatient appropriate because: Remains on IV Solu-Medrol due to continued wheezes today.  Likely home with home health 4/15 ? ? ?Antimicrobials:  ?Anti-infectives (From admission, onward)  ? ? Start     Dose/Rate Route Frequency Ordered Stop  ? 09/07/21 1000  levofloxacin (LEVAQUIN) tablet 500 mg  Status:  Discontinued       ? 500 mg Oral Every 48 hours 09/05/21 0608 09/05/21 1209  ? 09/05/21 1000  doxycycline (VIBRA-TABS) tablet 100 mg       ? 100 mg Oral Every 12 hours 09/05/21 0537 09/10/21 0959  ? 09/05/21 0615  levofloxacin (LEVAQUIN) IVPB 750 mg       ? 750 mg ?100 mL/hr over 90 Minutes Intravenous  Once 09/05/21 0605 09/05/21 1206  ? 09/05/21 0500  doxycycline (VIBRA-TABS) tablet 100 mg       ? 100 mg Oral  Once 09/05/21 6606 09/05/21 0528  ? ?  ? ? ? ?Objective: ?Vitals:  ? 09/06/21 0159 09/06/21 0500 09/06/21 0827 09/06/21 1148  ?BP:  (!) 140/50  (!) 144/55  ?Pulse:  74  70  ?Resp:  18  18  ?Temp:  98.4 ?F (36.9 ?C)  98.4 ?F (  36.9 ?C)  ?TempSrc:  Oral  Oral  ?SpO2: 94% 98% 97% 98%  ?Weight:      ?Height:      ? ? ?Intake/Output Summary (Last 24 hours) at 09/06/2021 1403 ?Last data filed at 09/06/2021 0815 ?Gross per 24 hour  ?Intake 430 ml  ?Output 1000 ml  ?Net -570 ml  ? ?Filed Weights  ? 09/04/21 1843  ?Weight: 64.5 kg  ? ? ?Examination:  ?General exam: Appears calm and comfortable  ?Respiratory system: Bilateral expiratory wheezes, improved from yesterday's exam but not back to baseline.  Respiratory effort is  normal.  On 2 L oxygen. ?Cardiovascular system: S1 & S2 heard, RRR. No murmurs. No pedal edema. ?Gastrointestinal system: Abdomen is nondistended, soft and nontender. Normal bowel sounds heard. ?Central nervous system: Alert and oriented. No focal neurological deficits. Speech clear.  ?Extremities: Symmetric in appearance  ?Skin: No rashes, lesions or ulcers on exposed skin  ?Psychiatry: Judgement and insight appear normal. Mood & affect appropriate.  ? ?Data Reviewed: I have personally reviewed following labs and imaging studies ? ?CBC: ?Recent Labs  ?Lab 09/04/21 ?1916 09/05/21 ?7591 09/06/21 ?6384  ?WBC 10.0 10.8* 8.5  ?NEUTROABS 7.4  --   --   ?HGB 9.7* 9.4* 8.9*  ?HCT 30.2* 29.6* 27.5*  ?MCV 90.7 91.1 89.9  ?PLT 308 270 285  ? ?Basic Metabolic Panel: ?Recent Labs  ?Lab 09/04/21 ?1916 09/05/21 ?6659 09/06/21 ?9357  ?NA 137  --  134*  ?K 4.3  --  3.9  ?CL 105  --  101  ?CO2 26  --  24  ?GLUCOSE 134*  --  208*  ?BUN 24*  --  30*  ?CREATININE 1.55* 1.40* 1.70*  ?CALCIUM 9.0  --  9.0  ?MG  --   --  2.1  ? ?GFR: ?Estimated Creatinine Clearance: 19.5 mL/min (A) (by C-G formula based on SCr of 1.7 mg/dL (H)). ?Liver Function Tests: ?Recent Labs  ?Lab 09/04/21 ?1916  ?AST 19  ?ALT 14  ?ALKPHOS 74  ?BILITOT 0.8  ?PROT 6.6  ?ALBUMIN 3.2*  ? ?No results for input(s): LIPASE, AMYLASE in the last 168 hours. ?No results for input(s): AMMONIA in the last 168 hours. ?Coagulation Profile: ?No results for input(s): INR, PROTIME in the last 168 hours. ?Cardiac Enzymes: ?No results for input(s): CKTOTAL, CKMB, CKMBINDEX, TROPONINI in the last 168 hours. ?BNP (last 3 results) ?No results for input(s): PROBNP in the last 8760 hours. ?HbA1C: ?No results for input(s): HGBA1C in the last 72 hours. ?CBG: ?Recent Labs  ?Lab 09/05/21 ?0808 09/05/21 ?1157 09/05/21 ?1630 09/05/21 ?2111 09/06/21 ?0804  ?GLUCAP 182* 253* 146* 170* 176*  ? ?Lipid Profile: ?No results for input(s): CHOL, HDL, LDLCALC, TRIG, CHOLHDL, LDLDIRECT in the last 72  hours. ?Thyroid Function Tests: ?No results for input(s): TSH, T4TOTAL, FREET4, T3FREE, THYROIDAB in the last 72 hours. ?Anemia Panel: ?No results for input(s): VITAMINB12, FOLATE, FERRITIN, TIBC, IRON, RETICCTPCT in the last 72 hours. ?Sepsis Labs: ?No results for input(s): PROCALCITON, LATICACIDVEN in the last 168 hours. ? ?Recent Results (from the past 240 hour(s))  ?Resp Panel by RT-PCR (Flu A&B, Covid) Nasopharyngeal Swab     Status: None  ? Collection Time: 09/04/21  7:19 PM  ? Specimen: Nasopharyngeal Swab; Nasopharyngeal(NP) swabs in vial transport medium  ?Result Value Ref Range Status  ? SARS Coronavirus 2 by RT PCR NEGATIVE NEGATIVE Final  ?  Comment: (NOTE) ?SARS-CoV-2 target nucleic acids are NOT DETECTED. ? ?The SARS-CoV-2 RNA is generally detectable in upper respiratory ?  specimens during the acute phase of infection. The lowest ?concentration of SARS-CoV-2 viral copies this assay can detect is ?138 copies/mL. A negative result does not preclude SARS-Cov-2 ?infection and should not be used as the sole basis for treatment or ?other patient management decisions. A negative result may occur with  ?improper specimen collection/handling, submission of specimen other ?than nasopharyngeal swab, presence of viral mutation(s) within the ?areas targeted by this assay, and inadequate number of viral ?copies(<138 copies/mL). A negative result must be combined with ?clinical observations, patient history, and epidemiological ?information. The expected result is Negative. ? ?Fact Sheet for Patients:  ?EntrepreneurPulse.com.au ? ?Fact Sheet for Healthcare Providers:  ?IncredibleEmployment.be ? ?This test is no t yet approved or cleared by the [place] FDA and  ?has been authorized for detection and/or diagnosis of SARS-CoV-2 by ?FDA under an Emergency Use Authorization (EUA). This EUA will remain  ?in effect (meaning this test can be used) for the duration of the ?COVID-19  declaration under Section 564(b)(1) of the Act, 21 ?U.S.C.section 360bbb-3(b)(1), unless the authorization is terminated  ?or revoked sooner.  ? ? ?  ? Influenza A by PCR NEGATIVE NEGATIVE Final  ? Influenza B by PCR NEGATIVE

## 2021-09-06 NOTE — Plan of Care (Signed)

## 2021-09-06 NOTE — TOC Initial Note (Signed)
Transition of Care (TOC) - Initial/Assessment Note  ? ? ?Patient Details  ?Name: Stacey Armstrong ?MRN: [id number] ?Date of Birth: 04-23-1928 ? ?Transition of Care (TOC) CM/SW Contact:    ?Trish Mage, LCSW ?Phone Number: ?09/06/2021, 2:05 PM ? ?Clinical Narrative:  After hearing from MD, confirmed with [facility] that patient lives in independent living, no problem returning on weekend, no FL2 needed.  Confirmed with daughter that she will be able to give her mother a ride home at d/c.  Confirmed with Cindie with [facility] that they will provide needed [facility] services. TOC will continue to follow during the course of hospitalization. ?               ? ? ?Expected Discharge Plan: [place] ?Barriers to Discharge: No Barriers Identified ? ? ?Patient Goals and CMS Choice ?  ?  ?  ? ?Expected Discharge Plan and Services ?Expected Discharge Plan: [place] ?  ?  ?  ?  ?Expected Discharge Date:  (unknown)               ?  ?  ?  ?  ?  ?  ?  ?  ?  ?  ? ?Prior Living Arrangements/Services ?  ?  ?  ?       ?  ?  ?  ?  ? ?Activities of Daily Living ?Home Assistive Devices/Equipment: Eyeglasses, Hearing aid, CBG Meter, Shower chair with back, Walker (specify type), Grab bars around toilet, Grab bars in shower, Hand-held shower hose, Bedside commode/3-in-1 (front wheeled walker, bilateral hearing aids) ?ADL Screening (condition at time of admission) ?Patient's cognitive ability adequate to safely complete daily activities?: Yes ?Is the patient deaf or have difficulty hearing?: No ?Does the patient have difficulty seeing, even when wearing glasses/contacts?: No ?Does the patient have difficulty concentrating, remembering, or making decisions?: No ?Patient able to express need for assistance with ADLs?: Yes ?Does the patient have difficulty dressing or bathing?: Yes (secondary to weakness) ?Independently performs ADLs?: No (secondary to weakness) ?Communication: Independent ?Dressing (OT): Needs  assistance ?Is this a change from baseline?: Pre-admission baseline ?Grooming: Independent ?Feeding: Needs assistance ?Is this a change from baseline?: Pre-admission baseline ?Bathing: Needs assistance ?Is this a change from baseline?: Pre-admission baseline ?Toileting: Needs assistance ?Is this a change from baseline?: Pre-admission baseline ?Walks in Home: Needs assistance ?Is this a change from baseline?: Pre-admission baseline ?Does the patient have difficulty walking or climbing stairs?: Yes (secondary to weakness) ?Weakness of Legs: Both ?Weakness of Arms/Hands: None ? ?Permission Sought/Granted ?  ?  ?   ?   ?   ?   ? ?Emotional Assessment ?  ?  ?  ?  ?  ?  ? ?Admission diagnosis:  Bronchitis [J40] ?Acute bronchitis, unspecified organism [J20.9] ?Acute congestive heart failure, unspecified heart failure type ([place]) [I50.9] ?Patient Active Problem List  ? Diagnosis Date Noted  ? Acute viral bronchitis 09/06/2021  ? Stage 3b chronic kidney disease (CKD) ([place]) 09/06/2021  ? Pulmonary nodule 09/06/2021  ? Weakness 09/06/2021  ? Bronchitis 09/05/2021  ? AKI (acute kidney injury) ([place]) 08/20/2021  ? Hyperlipidemia 08/20/2021  ? Hyponatremia 08/20/2021  ? Anemia in chronic renal disease 08/20/2021  ? Urolithiasis 08/20/2021  ? Mild protein malnutrition ([place]) 08/20/2021  ? Contact dermatitis 07/10/2020  ? Presbycusis of both ears 07/10/2020  ? Vasomotor rhinitis 07/10/2020  ? Fecal impaction ([place]) 12/07/2018  ? Type 2 diabetes mellitus with hyperlipidemia ([place]) 12/07/2018  ?  Essential hypertension 12/07/2018  ? Diarrhea 12/07/2018  ? Nausea and vomiting 12/07/2018  ? ARF (acute renal failure) ([place]) 12/07/2018  ? ?PCP:  [name], Rayford Halsted, MD ?Pharmacy:   ?[facility], [address] ?[place] ?[place] [address] ?Phone: [phone] Fax: [phone] ? ?[id number] [place] [place] [address] ?[address] ?[place] [address] ?Phone: [phone] Fax: [phone] ? ? ? ? ?Social Determinants of Health (SDOH) Interventions ?  ? ?Readmission Risk Interventions ?   ? View : No data to display.  ?  ?  ?  ? ? ? ?

## 2021-09-07 DIAGNOSIS — J9600 Acute respiratory failure, unspecified whether with hypoxia or hypercapnia: Secondary | ICD-10-CM | POA: Diagnosis present

## 2021-09-07 DIAGNOSIS — J208 Acute bronchitis due to other specified organisms: Secondary | ICD-10-CM | POA: Diagnosis not present

## 2021-09-07 LAB — GLUCOSE, CAPILLARY
Glucose-Capillary: 192 mg/dL — ABNORMAL HIGH (ref 70–99)
Glucose-Capillary: 165 mg/dL — ABNORMAL HIGH (ref 70–99)
Glucose-Capillary: 178 mg/dL — ABNORMAL HIGH (ref 70–99)

## 2021-09-07 LAB — BASIC METABOLIC PANEL
Anion gap: 10 (ref 5–15)
BUN: 44 mg/dL — ABNORMAL HIGH (ref 8–23)
CO2: 25 mmol/L (ref 22–32)
Calcium: 9.6 mg/dL (ref 8.9–10.3)
Chloride: 100 mmol/L (ref 98–111)
Creatinine, Ser: 1.8 mg/dL — ABNORMAL HIGH (ref 0.44–1.00)
GFR, Estimated: 26 mL/min — ABNORMAL LOW (ref >60)
Glucose, Bld: 186 mg/dL — ABNORMAL HIGH (ref 70–99)
Potassium: 4.2 mmol/L (ref 3.5–5.1)
Sodium: 135 mmol/L (ref 135–145)

## 2021-09-07 MED ORDER — MINERAL OIL RE ENEM
1.0000 | ENEMA | Freq: Once | RECTAL | Status: AC
  Administered 2021-09-07: 1 via RECTAL
  Filled 2021-09-07: qty 1

## 2021-09-07 NOTE — Progress Notes (Signed)
?PROGRESS NOTE ? ? ? ?Stacey Armstrong  PHX:[id number] DOB: 27-Oct-1927 DOA: 09/04/2021 ?PCP: [name], Rayford Halsted, MD  ? ?  ?Brief Narrative:  ?Stacey Armstrong is a 88 y.o. female with medical history significant for type 2 diabetes, former tobacco user (quit 23 years ago), history of DVT after hysterectomy off anticoagulation, CKD 4, GERD, TIA, hyperlipidemia, hypertension, OSA, urolithiasis post cystoscopy on 08/14/2021, right ureteral stent placement due to right obstructing UPJ urolith, recently admitted for AKI secondary to dehydration and discharged on 08/22/2021 who presents from home to [facility] ED with complaints of worsening productive cough and nasal congestion.  She went on a trip to [place] and [place] and returned home 4/11.  After returning home she had worsening productive cough and wheezing.  Also had bilateral lower extremity edema which has now improved after IV Lasix administered in the ED.  She received albuterol neb treatment x1, IV Lasix 20 mg x 1 and 200 mg Tessalon Perles x1.  Due to elevated D-dimer CTA chest was done which was negative for pulmonary embolism.  Patient tested positive for rhinovirus on respiratory viral panel.  Patient was admitted for viral bronchitis. ? ? ?New events last 24 hours / Subjective: ?Currently on 2 L. Reports feeling a little better.  Still wheezing.  Breathing treatments to help.  No pain or swelling. ? ?Assessment & Plan: ?  ?Principal Problem: ?  Acute viral bronchitis ?Active Problems: ?  Type 2 diabetes mellitus with hyperlipidemia ([place]) ?  Essential hypertension ?  Bronchitis ?  CKD (chronic kidney disease) stage 4, GFR 15-29 ml/min (HCC) ?  Pulmonary nodule ?  Weakness ?  Acute respiratory failure ([place]) ? ?Acute viral bronchitis ? Duonebs ? Flutter valve and percussion vest ? Prednisone 40 mg daily ?Resp cx + for rhinovirus ? ?Acute respiratory failure ? As above ? Prednisone + doxy to cover for COPD exacerbation given smoking hx ? 2 L [place], wean as  able ? ?Prediabetes ?Sliding scale insulin ?A1c 6 ? ?Hypertension ?Continue Norvasc 10 mg daily, Zebeta 5 mg daily ? ?Hyperlipidemia ?Continue Lipitor 20 mg daily ?  ?CKD stage 4 ? Baseline creatinine 1.4-1.7, stable today, continue to monitor BMP ?  ?Pulmonary nodules seen on CT ? To follow-up outpatient ?  ?Weakness ?PT OT recommending home health for discharge ? ?DVT prophylaxis: Lovenox ?Code Status: Full ?Family Communication: Spoke with daughter on the phone ?Coming From: Home ?Disposition Plan: Home with home health ?Barriers to Discharge: Clinical improvement, continued wheezing ? ?Consultants:  ?None ? ?Procedures:  ?None ? ?Antimicrobials:  ?Anti-infectives (From admission, onward)  ? ? Start     Dose/Rate Route Frequency Ordered Stop  ? 09/07/21 1000  levofloxacin (LEVAQUIN) tablet 500 mg  Status:  Discontinued       ? 500 mg Oral Every 48 hours 09/05/21 0608 09/05/21 1209  ? 09/05/21 1000  doxycycline (VIBRA-TABS) tablet 100 mg       ? 100 mg Oral Every 12 hours 09/05/21 0537 09/10/21 0959  ? 09/05/21 0615  levofloxacin (LEVAQUIN) IVPB 750 mg       ? 750 mg ?100 mL/hr over 90 Minutes Intravenous  Once 09/05/21 0605 09/05/21 1206  ? 09/05/21 0500  doxycycline (VIBRA-TABS) tablet 100 mg       ? 100 mg Oral  Once 09/05/21 0165 09/05/21 0528  ? ?  ? ? ? ?Objective: ?Vitals:  ? 09/06/21 2027 09/07/21 0126 09/07/21 0511 09/07/21 0805  ?BP:   (!) 138/54   ?Pulse:  69   ?Resp:   16   ?Temp:   97.6 ?F (36.4 ?C)   ?TempSrc:   Oral   ?SpO2: 99% 96% 95% 94%  ?Weight:      ?Height:      ? ? ?Intake/Output Summary (Last 24 hours) at 09/07/2021 0839 ?Last data filed at 09/07/2021 0500 ?Gross per 24 hour  ?Intake 240 ml  ?Output 800 ml  ?Net -560 ml  ? ?Filed Weights  ? 09/04/21 1843  ?Weight: 64.5 kg  ? ? ?Examination:  ?General exam: Appears calm and comfortable  ?Respiratory system: Diffuse expiratory wheezing appreciated.  Respiratory effort normal. No respiratory distress. No conversational dyspnea.  ?Cardiovascular  system: S1 & S2 heard, RRR. No murmurs. No pedal edema. ?Gastrointestinal system: Abdomen is nondistended, soft and nontender. Normal bowel sounds heard. ?Central nervous system: Alert and oriented. No focal neurological deficits. Speech clear.  ?Extremities: Symmetric in appearance  ?Skin: No rashes, lesions or ulcers on exposed skin  ?Psychiatry: Mood & affect appropriate.  ? ?Data Reviewed: I have personally reviewed following labs and imaging studies ? ?CBC: ?Recent Labs  ?Lab 09/04/21 ?1916 09/05/21 ?6384 09/06/21 ?5364  ?WBC 10.0 10.8* 8.5  ?NEUTROABS 7.4  --   --   ?HGB 9.7* 9.4* 8.9*  ?HCT 30.2* 29.6* 27.5*  ?MCV 90.7 91.1 89.9  ?PLT 308 270 285  ? ?Basic Metabolic Panel: ?Recent Labs  ?Lab 09/04/21 ?1916 09/05/21 ?6803 09/06/21 ?2122 09/07/21 ?0603  ?NA 137  --  134* 135  ?K 4.3  --  3.9 4.2  ?CL 105  --  101 100  ?CO2 26  --  24 25  ?GLUCOSE 134*  --  208* 186*  ?BUN 24*  --  30* 44*  ?CREATININE 1.55* 1.40* 1.70* 1.80*  ?CALCIUM 9.0  --  9.0 9.6  ?MG  --   --  2.1  --   ? ?GFR: ?Estimated Creatinine Clearance: 18.4 mL/min (A) (by C-G formula based on SCr of 1.8 mg/dL (H)). ? ?Liver Function Tests: ?Recent Labs  ?Lab 09/04/21 ?1916  ?AST 19  ?ALT 14  ?ALKPHOS 74  ?BILITOT 0.8  ?PROT 6.6  ?ALBUMIN 3.2*  ? ?CBG: ?Recent Labs  ?Lab 09/06/21 ?0804 09/06/21 ?1144 09/06/21 ?1701 09/06/21 ?1957 09/07/21 ?4825  ?GLUCAP 176* 165* 185* 244* 192*  ? ?Recent Results (from the past 240 hour(s))  ?Resp Panel by RT-PCR (Flu A&B, Covid) Nasopharyngeal Swab     Status: None  ? Collection Time: 09/04/21  7:19 PM  ? Specimen: Nasopharyngeal Swab; Nasopharyngeal(NP) swabs in vial transport medium  ?Result Value Ref Range Status  ? SARS Coronavirus 2 by RT PCR NEGATIVE NEGATIVE Final  ?  Comment: (NOTE) ?SARS-CoV-2 target nucleic acids are NOT DETECTED. ? ?The SARS-CoV-2 RNA is generally detectable in upper respiratory ?specimens during the acute phase of infection. The lowest ?concentration of SARS-CoV-2 viral copies this  assay can detect is ?138 copies/mL. A negative result does not preclude SARS-Cov-2 ?infection and should not be used as the sole basis for treatment or ?other patient management decisions. A negative result may occur with  ?improper specimen collection/handling, submission of specimen other ?than nasopharyngeal swab, presence of viral mutation(s) within the ?areas targeted by this assay, and inadequate number of viral ?copies(<138 copies/mL). A negative result must be combined with ?clinical observations, patient history, and epidemiological ?information. The expected result is Negative. ? ?Fact Sheet for Patients:  ?EntrepreneurPulse.com.au ? ?Fact Sheet for Healthcare Providers:  ?IncredibleEmployment.be ? ?This test is no t yet approved  or cleared by the [place] and  ?has been authorized for detection and/or diagnosis of SARS-CoV-2 by ?FDA under an Emergency Use Authorization (EUA). This EUA will remain  ?in effect (meaning this test can be used) for the duration of the ?COVID-19 declaration under Section 564(b)(1) of the Act, 21 ?U.S.C.section 360bbb-3(b)(1), unless the authorization is terminated  ?or revoked sooner.  ? ? ?  ? Influenza A by PCR NEGATIVE NEGATIVE Final  ? Influenza B by PCR NEGATIVE NEGATIVE Final  ?  Comment: (NOTE) ?The Xpert Xpress SARS-CoV-2/FLU/RSV plus assay is intended as an aid ?in the diagnosis of influenza from Nasopharyngeal swab specimens and ?should not be used as a sole basis for treatment. Nasal washings and ?aspirates are unacceptable for Xpert Xpress SARS-CoV-2/FLU/RSV ?testing. ? ?Fact Sheet for Patients: ?EntrepreneurPulse.com.au ? ?Fact Sheet for Healthcare Providers: ?IncredibleEmployment.be ? ?This test is not yet approved or cleared by the [place] FDA and ?has been authorized for detection and/or diagnosis of SARS-CoV-2 by ?FDA under an Emergency Use Authorization (EUA). This EUA will  remain ?in effect (meaning this test can be used) for the duration of the ?COVID-19 declaration under Section 564(b)(1) of the Act, 21 U.S.C. ?section 360bbb-3(b)(1), unless the authorization is terminated or ?revoke

## 2021-09-07 NOTE — Progress Notes (Addendum)
Vest on hold due to patient getting ready to eat. Patient did do neb to help with wheezing.  ?

## 2021-09-07 NOTE — Progress Notes (Signed)
Flutter valve given to pt per MD order.  ?

## 2021-09-08 DIAGNOSIS — J208 Acute bronchitis due to other specified organisms: Secondary | ICD-10-CM | POA: Diagnosis not present

## 2021-09-08 LAB — BASIC METABOLIC PANEL
Anion gap: 9 (ref 5–15)
BUN: 46 mg/dL — ABNORMAL HIGH (ref 8–23)
CO2: 26 mmol/L (ref 22–32)
Calcium: 9.3 mg/dL (ref 8.9–10.3)
Chloride: 101 mmol/L (ref 98–111)
Creatinine, Ser: 1.77 mg/dL — ABNORMAL HIGH (ref 0.44–1.00)
GFR, Estimated: 27 mL/min — ABNORMAL LOW (ref >60)
Glucose, Bld: 121 mg/dL — ABNORMAL HIGH (ref 70–99)
Potassium: 3.9 mmol/L (ref 3.5–5.1)
Sodium: 136 mmol/L (ref 135–145)

## 2021-09-08 LAB — CBC
HCT: 29.7 % — ABNORMAL LOW (ref 36.0–46.0)
Hemoglobin: 9.7 g/dL — ABNORMAL LOW (ref 12.0–15.0)
MCH: 28.7 pg (ref 26.0–34.0)
MCHC: 32.7 g/dL (ref 30.0–36.0)
MCV: 87.9 fL (ref 80.0–100.0)
Platelets: 385 10*3/uL (ref 150–400)
RBC: 3.38 MIL/uL — ABNORMAL LOW (ref 3.87–5.11)
RDW: 14.6 % (ref 11.5–15.5)
WBC: 9.3 10*3/uL (ref 4.0–10.5)
nRBC: 0 % (ref 0.0–0.2)

## 2021-09-08 MED ORDER — IPRATROPIUM-ALBUTEROL 0.5-2.5 (3) MG/3ML IN SOLN
3.0000 mL | Freq: Three times a day (TID) | RESPIRATORY_TRACT | Status: DC
  Administered 2021-09-08: 3 mL via RESPIRATORY_TRACT
  Filled 2021-09-08: qty 3

## 2021-09-08 MED ORDER — BUDESONIDE 0.25 MG/2ML IN SUSP
0.2500 mg | Freq: Two times a day (BID) | RESPIRATORY_TRACT | Status: DC
  Administered 2021-09-08 – 2021-09-09 (×3): 0.25 mg via RESPIRATORY_TRACT
  Filled 2021-09-08 (×4): qty 2

## 2021-09-08 MED ORDER — IPRATROPIUM-ALBUTEROL 0.5-2.5 (3) MG/3ML IN SOLN
3.0000 mL | RESPIRATORY_TRACT | Status: DC
  Administered 2021-09-08 – 2021-09-09 (×7): 3 mL via RESPIRATORY_TRACT
  Filled 2021-09-08 (×7): qty 3

## 2021-09-08 MED ORDER — ARFORMOTEROL TARTRATE 15 MCG/2ML IN NEBU
15.0000 ug | INHALATION_SOLUTION | Freq: Two times a day (BID) | RESPIRATORY_TRACT | Status: DC
  Administered 2021-09-08 – 2021-09-09 (×2): 15 ug via RESPIRATORY_TRACT
  Filled 2021-09-08 (×2): qty 2

## 2021-09-08 MED ORDER — GUAIFENESIN ER 600 MG PO TB12
600.0000 mg | ORAL_TABLET | Freq: Two times a day (BID) | ORAL | Status: DC
  Administered 2021-09-08 (×2): 600 mg via ORAL
  Filled 2021-09-08 (×2): qty 1

## 2021-09-08 NOTE — Progress Notes (Signed)
?PROGRESS NOTE ? ? ? ?Stacey Armstrong  WEX:[id number] DOB: 04/06/1928 DOA: 09/04/2021 ?PCP: [name], Rayford Halsted, MD  ? ? ?Brief Narrative:  ?88 y.o. female with medical history significant for type 2 diabetes, former tobacco user (quit 23 years ago), history of DVT after hysterectomy off anticoagulation, CKD 4, GERD, TIA, hyperlipidemia, hypertension, OSA, urolithiasis post cystoscopy on 08/14/2021, right ureteral stent placement due to right obstructing UPJ urolith, recently admitted for AKI secondary to dehydration and discharged on 08/22/2021 who presents from home to [facility] ED with complaints of worsening productive cough and nasal congestion.  She went on a trip to [place] and [place] and returned home 4/11.  After returning home she had worsening productive cough and wheezing.  Also had bilateral lower extremity edema which has now improved after IV Lasix administered in the ED.  She received albuterol neb treatment x1, IV Lasix 20 mg x 1 and 200 mg Tessalon Perles x1.  Due to elevated D-dimer CTA chest was done which was negative for pulmonary embolism.  Patient tested positive for rhinovirus on respiratory viral panel.  Patient was admitted for viral bronchitis. ? ?4/16: Weaned to room air however still with significant cough and wheezing associated with shortness of breath ? ? ?Assessment & Plan: ?  ?Principal Problem: ?  Acute viral bronchitis ?Active Problems: ?  Type 2 diabetes mellitus with hyperlipidemia ([place]) ?  Essential hypertension ?  Bronchitis ?  CKD (chronic kidney disease) stage 4, GFR 15-29 ml/min (HCC) ?  Pulmonary nodule ?  Weakness ?  Acute respiratory failure ([place]) ? ?Acute viral bronchitis ?Positive rhinovirus ? ?Patient clinically improving over interval ?Weaned to room air ?Still with significant cough and wheezing associated with shortness of breath ?Plan: ?Continue prednisone 40 mg daily x5-day course ?Increase frequency of DuoNebs to every 4 hours scheduled ?Twice daily  [place]/Pulmicort ?Flutter valve and percussion vest therapy ?Ambulate as tolerated          ?  ?Acute respiratory failure, resolved ?Acute exacerbation of COPD ?Suspect viral syndrome may have triggered COPD exacerbation ?Relatively mild at this time, patient on room air ?Plan: ?Aggressive nebulizer regimen as above ?Pulmonary toileting ?Prednisone 40 mg daily x5 days ?Vital signs as unit protocol ?Empiric doxycycline           ?  ?Prediabetes ?Sliding scale insulin ?A1c 6 ? ?Hypertension ?Continue Norvasc 10 mg daily, Zebeta 5 mg daily ? ?Hyperlipidemia ?Continue Lipitor 20 mg daily ?  ?CKD stage 4 ?            Baseline creatinine 1.4-1.7, stable today, continue to monitor BMP ?  ?Pulmonary nodules seen on CT ?            To follow-up outpatient ?  ?Weakness ?PT OT recommending home health for discharge ?Orders placed ? ? ?DVT prophylaxis: SQ Lovenox ?Code Status: Full ?Family Communication:Daughter Nevin Bloodgood [phone] ?Disposition Plan: Status is: Inpatient ?Remains inpatient appropriate because: Shortness of breath associated with wheezing and cough in the setting of slowly resolving respiratory failure and acute bronchitis.  Possible medical readiness for discharge in 24 to 48 hours. ? ? ?Level of care: Med-Surg ? ?Consultants:  ?None ? ?Procedures:  ?None ? ?Antimicrobials: ?Doxycycline ? ? ?Subjective: ?Patient seen and examined.  Resting in bed.  Significant auditory wheeze associated with cough and shortness of breath. ? ?Objective: ?Vitals:  ? 09/08/21 0256 09/08/21 0426 09/08/21 0836 09/08/21 1150  ?BP:  (!) 130/53    ?Pulse:  71    ?Resp:  16    ?  Temp:  98.1 ?F (36.7 ?C)    ?TempSrc:  Oral    ?SpO2: 96% 91% 91% 95%  ?Weight:      ?Height:      ? ? ?Intake/Output Summary (Last 24 hours) at 09/08/2021 1259 ?Last data filed at 09/08/2021 0300 ?Gross per 24 hour  ?Intake 220 ml  ?Output 700 ml  ?Net -480 ml  ? ?Filed Weights  ? 09/04/21 1843  ?Weight: 64.5 kg  ? ? ?Examination: ? ?General exam: NAD ?Respiratory  system: Diffuse end expiratory wheeze bilaterally.  Bibasilar crackles.  Normal work of breathing.  Room air ?Cardiovascular system: S1-S2, RRR, no murmurs, no pedal edema ?Gastrointestinal system: Soft, NT/ND, normal bowel sounds ?Central nervous system: Alert and oriented. No focal neurological deficits. ?Extremities: Symmetric 5 x 5 power. ?Skin: No rashes, lesions or ulcers ?Psychiatry: Judgement and insight appear normal. Mood & affect appropriate.  ? ? ? ?Data Reviewed: I have personally reviewed following labs and imaging studies ? ?CBC: ?Recent Labs  ?Lab 09/04/21 ?1916 09/05/21 ?0350 09/06/21 ?0938 09/08/21 ?1829  ?WBC 10.0 10.8* 8.5 9.3  ?NEUTROABS 7.4  --   --   --   ?HGB 9.7* 9.4* 8.9* 9.7*  ?HCT 30.2* 29.6* 27.5* 29.7*  ?MCV 90.7 91.1 89.9 87.9  ?PLT 308 270 285 385  ? ?Basic Metabolic Panel: ?Recent Labs  ?Lab 09/04/21 ?1916 09/05/21 ?9371 09/06/21 ?6967 09/07/21 ?0603 09/08/21 ?8938  ?NA 137  --  134* 135 136  ?K 4.3  --  3.9 4.2 3.9  ?CL 105  --  101 100 101  ?CO2 26  --  $R'24 25 26  'SP$ ?GLUCOSE 134*  --  208* 186* 121*  ?BUN 24*  --  30* 44* 46*  ?CREATININE 1.55* 1.40* 1.70* 1.80* 1.77*  ?CALCIUM 9.0  --  9.0 9.6 9.3  ?MG  --   --  2.1  --   --   ? ?GFR: ?Estimated Creatinine Clearance: 18.7 mL/min (A) (by C-G formula based on SCr of 1.77 mg/dL (H)). ?Liver Function Tests: ?Recent Labs  ?Lab 09/04/21 ?1916  ?AST 19  ?ALT 14  ?ALKPHOS 74  ?BILITOT 0.8  ?PROT 6.6  ?ALBUMIN 3.2*  ? ?No results for input(s): LIPASE, AMYLASE in the last 168 hours. ?No results for input(s): AMMONIA in the last 168 hours. ?Coagulation Profile: ?No results for input(s): INR, PROTIME in the last 168 hours. ?Cardiac Enzymes: ?No results for input(s): CKTOTAL, CKMB, CKMBINDEX, TROPONINI in the last 168 hours. ?BNP (last 3 results) ?No results for input(s): PROBNP in the last 8760 hours. ?HbA1C: ?No results for input(s): HGBA1C in the last 72 hours. ?CBG: ?Recent Labs  ?Lab 09/06/21 ?1701 09/06/21 ?1957 09/07/21 ?1017  09/07/21 ?1128 09/07/21 ?1628  ?GLUCAP 185* 244* 192* 165* 178*  ? ?Lipid Profile: ?No results for input(s): CHOL, HDL, LDLCALC, TRIG, CHOLHDL, LDLDIRECT in the last 72 hours. ?Thyroid Function Tests: ?No results for input(s): TSH, T4TOTAL, FREET4, T3FREE, THYROIDAB in the last 72 hours. ?Anemia Panel: ?No results for input(s): VITAMINB12, FOLATE, FERRITIN, TIBC, IRON, RETICCTPCT in the last 72 hours. ?Sepsis Labs: ?No results for input(s): PROCALCITON, LATICACIDVEN in the last 168 hours. ? ?Recent Results (from the past 240 hour(s))  ?Resp Panel by RT-PCR (Flu A&B, Covid) Nasopharyngeal Swab     Status: None  ? Collection Time: 09/04/21  7:19 PM  ? Specimen: Nasopharyngeal Swab; Nasopharyngeal(NP) swabs in vial transport medium  ?Result Value Ref Range Status  ? SARS Coronavirus 2 by RT PCR NEGATIVE NEGATIVE Final  ?  Comment: (NOTE) ?SARS-CoV-2 target nucleic acids are NOT DETECTED. ? ?The SARS-CoV-2 RNA is generally detectable in upper respiratory ?specimens during the acute phase of infection. The lowest ?concentration of SARS-CoV-2 viral copies this assay can detect is ?138 copies/mL. A negative result does not preclude SARS-Cov-2 ?infection and should not be used as the sole basis for treatment or ?other patient management decisions. A negative result may occur with  ?improper specimen collection/handling, submission of specimen other ?than nasopharyngeal swab, presence of viral mutation(s) within the ?areas targeted by this assay, and inadequate number of viral ?copies(<138 copies/mL). A negative result must be combined with ?clinical observations, patient history, and epidemiological ?information. The expected result is Negative. ? ?Fact Sheet for Patients:  ?EntrepreneurPulse.com.au ? ?Fact Sheet for Healthcare Providers:  ?IncredibleEmployment.be ? ?This test is no t yet approved or cleared by the [place] FDA and  ?has been authorized for detection and/or diagnosis  of SARS-CoV-2 by ?FDA under an Emergency Use Authorization (EUA). This EUA will remain  ?in effect (meaning this test can be used) for the duration of the ?COVID-19 declaration under Section 564(b)(1) of the Act, 21 ?U.S.C.sec

## 2021-09-08 NOTE — Progress Notes (Signed)
Spoke with Lavina Hamman, patient's daughter, she does not want [facility] for home health services. She prefers [facility], contact person: Jeb Levering [phone]. He has worked with Mrs. Cogbill multiples times since she's been in [place].  ?

## 2021-09-09 ENCOUNTER — Other Ambulatory Visit (HOSPITAL_COMMUNITY): Payer: Self-pay

## 2021-09-09 DIAGNOSIS — E785 Hyperlipidemia, unspecified: Secondary | ICD-10-CM

## 2021-09-09 DIAGNOSIS — R911 Solitary pulmonary nodule: Secondary | ICD-10-CM | POA: Diagnosis not present

## 2021-09-09 DIAGNOSIS — J9601 Acute respiratory failure with hypoxia: Secondary | ICD-10-CM

## 2021-09-09 DIAGNOSIS — J208 Acute bronchitis due to other specified organisms: Secondary | ICD-10-CM | POA: Diagnosis not present

## 2021-09-09 DIAGNOSIS — E1169 Type 2 diabetes mellitus with other specified complication: Secondary | ICD-10-CM | POA: Diagnosis not present

## 2021-09-09 DIAGNOSIS — R531 Weakness: Secondary | ICD-10-CM

## 2021-09-09 LAB — BASIC METABOLIC PANEL
Anion gap: 8 (ref 5–15)
BUN: 44 mg/dL — ABNORMAL HIGH (ref 8–23)
CO2: 25 mmol/L (ref 22–32)
Calcium: 9.3 mg/dL (ref 8.9–10.3)
Chloride: 102 mmol/L (ref 98–111)
Creatinine, Ser: 1.62 mg/dL — ABNORMAL HIGH (ref 0.44–1.00)
GFR, Estimated: 30 mL/min — ABNORMAL LOW (ref >60)
Glucose, Bld: 161 mg/dL — ABNORMAL HIGH (ref 70–99)
Potassium: 4.5 mmol/L (ref 3.5–5.1)
Sodium: 135 mmol/L (ref 135–145)

## 2021-09-09 LAB — CBC
HCT: 28.8 % — ABNORMAL LOW (ref 36.0–46.0)
Hemoglobin: 9.4 g/dL — ABNORMAL LOW (ref 12.0–15.0)
MCH: 28.8 pg (ref 26.0–34.0)
MCHC: 32.6 g/dL (ref 30.0–36.0)
MCV: 88.3 fL (ref 80.0–100.0)
Platelets: 353 10*3/uL (ref 150–400)
RBC: 3.26 MIL/uL — ABNORMAL LOW (ref 3.87–5.11)
RDW: 14.4 % (ref 11.5–15.5)
WBC: 6.6 10*3/uL (ref 4.0–10.5)
nRBC: 0 % (ref 0.0–0.2)

## 2021-09-09 LAB — GLUCOSE, CAPILLARY
Glucose-Capillary: 202 mg/dL — ABNORMAL HIGH (ref 70–99)
Glucose-Capillary: 110 mg/dL — ABNORMAL HIGH (ref 70–99)
Glucose-Capillary: 108 mg/dL — ABNORMAL HIGH (ref 70–99)
Glucose-Capillary: 120 mg/dL — ABNORMAL HIGH (ref 70–99)
Glucose-Capillary: 237 mg/dL — ABNORMAL HIGH (ref 70–99)
Glucose-Capillary: 115 mg/dL — ABNORMAL HIGH (ref 70–99)

## 2021-09-09 MED ORDER — GUAIFENESIN ER 600 MG PO TB12: 600.0000 mg | ORAL_TABLET | Freq: Two times a day (BID) | ORAL | 0 refills | Status: DC

## 2021-09-09 MED ORDER — BUDESONIDE 0.25 MG/2ML IN SUSP: 0.2500 mg | Freq: Two times a day (BID) | RESPIRATORY_TRACT | 12 refills | Status: DC

## 2021-09-09 MED ORDER — ALBUTEROL SULFATE HFA 108 (90 BASE) MCG/ACT IN AERS: 2.0000 | INHALATION_SPRAY | Freq: Four times a day (QID) | RESPIRATORY_TRACT | 2 refills | Status: DC | PRN

## 2021-09-09 MED ORDER — IPRATROPIUM-ALBUTEROL 0.5-2.5 (3) MG/3ML IN SOLN
RESPIRATORY_TRACT | 1 refills | Status: DC
Start: 2021-09-09 — End: 2022-03-12
  Filled 2021-09-09: qty 360, 30d supply, fill #0

## 2021-09-09 MED ORDER — IPRATROPIUM-ALBUTEROL 0.5-2.5 (3) MG/3ML IN SOLN: 3.0000 mL | Freq: Four times a day (QID) | RESPIRATORY_TRACT | 1 refills | Status: DC | PRN

## 2021-09-09 MED ORDER — PREDNISONE 10 MG PO TABS
ORAL_TABLET | ORAL | 0 refills | Status: DC
Start: 2021-09-09 — End: 2022-02-03

## 2021-09-09 NOTE — Progress Notes (Signed)
SATURATION QUALIFICATIONS: (This note is used to comply with regulatory documentation for home oxygen)  Patient Saturations on Room Air at Rest = 98%  Patient Saturations on Room Air while Ambulating = 94%  Patient Saturations on 0 Liters of oxygen while Ambulating = 94%  Please briefly explain why patient needs home oxygen: 

## 2021-09-09 NOTE — TOC Transition Note (Addendum)
Transition of Care (TOC) - CM/SW Discharge Note ? ?Patient Details  ?Name: Stacey Armstrong ?MRN: [id number] ?Date of Birth: 09-06-1927 ? ?Transition of Care (TOC) CM/SW Contact:  ?Sherie Don, LCSW ?Phone Number: ?09/09/2021, 11:05 AM ? ?Clinical Narrative: CSW notified that patient's daughter, Lavina Hamman, requested [facility] once the patient returns to [facility] as this is the agency the patient has used before. CSW spoke with daughter and confirmed the request. CSW spoke with Kandis Mannan with [facility] and confirmed the agency will resume seeing the patient at the facility. CSW notified Cindie with [facility] regarding the family's request to change [facility] agencies. TOC signing off. ? ?Addendum: CSW notified patient will need a nebulizer for home use. CSW placed nebulizer order through [place] with [employer]. [employer] to deliver DME to patient's room. ? ?Final next level of care: [place] ?Barriers to Discharge: No Barriers Identified ? ?Patient Goals and CMS Choice ?Patient states their goals for this hospitalization and ongoing recovery are:: Return to [facility] with [facility] through [facility] ?CMS Medicare.gov Compare Post Acute Care list provided to:: Patient Represenative (must comment) ?Choice offered to / list presented to : Adult Children ? ?Discharge Plan and Services       ?DME Arranged: N/A ?DME Agency: NA ?HH Arranged: OT, PT ?[place] Agency: Other - See comment ([place]) ?Date HH Agency Contacted: 09/09/21 ?Representative spoke with at [place]: Kandis Mannan ? ?Readmission Risk Interventions ?   ? View : No data to display.  ?  ?  ?  ? ? ? ? ?

## 2021-09-09 NOTE — Progress Notes (Signed)
Physical Therapy Treatment ?Patient Details ?Name: Stacey Armstrong ?MRN: [id number] ?DOB: [birth year] ?Today's Date: 09/09/2021 ? ? ?History of Present Illness 88 y.o. female with medical history significant for type 2 diabetes, former tobacco user(quit 23 years ago), history of DVT after hysterectomy off anticoagulation, CKD 4, GERD, TIA, hyperlipidemia, hypertension, OSA, urolithiasis post cystoscopy on 08/14/2021, right ureteral stent placement due to right obstructing UPJ urolith, recently admitted for AKI secondary to dehydration and discharged on 08/22/2021 who presents from home to [facility] ED with complaints of worsening productive cough and nasal congestion. Dx of bronchitis. ? ?  ?PT Comments  ? ? Patient continues to be motivated to mobilize and  ambulated ~120' and additional short bout to bathroom and back. No LOB noted throughout and pt demonstrated safe management of rollator during mobility. Patient maintained SpO2 of 94-100% with mobility with slight DOE. She will benefit from follow HHPT when pt returns to [place]. Acute PT will continue to progress pt as able throughout stay. ?  ?Recommendations for follow up therapy are one component of a multi-disciplinary discharge planning process, led by the attending physician.  Recommendations may be updated based on patient status, additional functional criteria and insurance authorization. ? ?Follow Up Recommendations ? Home health PT ?  ?  ?Assistance Recommended at Discharge Set up Supervision/Assistance  ?Patient can return home with the following Assist for transportation;Assistance with cooking/housework;Help with stairs or ramp for entrance ?  ?Equipment Recommendations ? None recommended by PT  ?  ?Recommendations for Other Services   ? ? ?  ?Precautions / Restrictions Precautions ?Precautions: Fall ?Precaution Comments: 2 falls in past 6 months ("I tripped over my own feet") ?Restrictions ?Weight Bearing Restrictions: No  ?  ? ?Mobility ? Bed Mobility ?Overal bed  mobility: Needs Assistance ?Bed Mobility: Supine to Sit ?  ?  ?Supine to sit: Min guard ?  ?  ?General bed mobility comments: guarding for safety with cues to use bed rail to pivot hips/trunk ?  ? ?Transfers ?Overall transfer level: Needs assistance ?Equipment used: Rollator (4 wheels) ?Transfers: Sit to/from Stand ?Sit to Stand: Min guard, Supervision ?  ?  ?  ?  ?  ?General transfer comment: guarding/supervision for safety, no assist needed to power up from EOB, recliner, toilet. ?  ? ?Ambulation/Gait ?Ambulation/Gait assistance: Min guard ?Gait Distance (Feet): 120 Feet (+30) ?Assistive device: Rollator (4 wheels) ?Gait Pattern/deviations: Step-through pattern, Decreased stride length ?Gait velocity: decr ?  ?  ?General Gait Details: pt steady with no LOB noted. SpO2 94% or greater during gait and recovered to 100% after on RA. pt maintained safe management of Rollator. pt amb in hallway and then additaional walk to bathroom and back to bed. ? ? ?Stairs ?  ?  ?  ?  ?  ? ? ?Wheelchair Mobility ?  ? ?Modified Rankin (Stroke Patients Only) ?  ? ? ?  ?Balance Overall balance assessment: History of Falls, Needs assistance ?Sitting-balance support: No upper extremity supported, Feet unsupported ?Sitting balance-Leahy Scale: Good ?  ?  ?Standing balance support: Reliant on assistive device for balance ?Standing balance-Leahy Scale: Poor ?  ?  ?  ?  ?  ?  ?  ?  ?  ?  ?  ?  ?  ? ?  ?Cognition Arousal/Alertness: Awake/alert ?Behavior During Therapy: [facility] for tasks assessed/performed ?Overall Cognitive Status: Within Functional Limits for tasks assessed ?  ?  ?  ?  ?  ?  ?  ?  ?  ?  ?  ?  ?  ?  ?  ?  ?  ?  ?  ? ?  ?  Exercises   ? ?  ?General Comments   ?  ?  ? ?Pertinent Vitals/Pain Pain Assessment ?Pain Assessment: No/denies pain  ? ? ?Home Living   ?  ?  ?  ?  ?  ?  ?  ?  ?  ?   ?  ?Prior Function    ?  ?  ?   ? ?PT Goals (current goals can now be found in the care plan section) Acute Rehab PT Goals ?Patient Stated Goal:  return to ILF ?PT Goal Formulation: With patient/family ?Time For Goal Achievement: 09/19/21 ?Potential to Achieve Goals: Good ?Progress towards PT goals: Progressing toward goals ? ?  ?Frequency ? ? ? Min 3X/week ? ? ? ?  ?PT Plan Current plan remains appropriate  ? ? ?Co-evaluation   ?  ?  ?  ?  ? ?  ?AM-PAC PT "6 Clicks" Mobility   ?Outcome Measure ? Help needed turning from your back to your side while in a flat bed without using bedrails?: None ?Help needed moving from lying on your back to sitting on the side of a flat bed without using bedrails?: None ?Help needed moving to and from a bed to a chair (including a wheelchair)?: A Little ?Help needed standing up from a chair using your arms (e.g., wheelchair or bedside chair)?: A Little ?Help needed to walk in hospital room?: A Little ?Help needed climbing 3-5 steps with a railing? : A Little ?6 Click Score: 20 ? ?  ?End of Session Equipment Utilized During Treatment: Gait belt ?Activity Tolerance: Patient tolerated treatment well ?Patient left: in chair;with chair alarm set;with call bell/phone within reach ?Nurse Communication: Mobility status ?PT Visit Diagnosis: History of falling (Z91.81);Difficulty in walking, not elsewhere classified (R26.2) ?  ? ? ?Time: 1583-0940 ?PT Time Calculation (min) (ACUTE ONLY): 26 min ? ?Charges:  $Gait Training: 8-22 mins ?$Therapeutic Activity: 8-22 mins          ?          ? ?Gwynneth Albright PT, DPT ?Acute Rehabilitation Services ?Office [phone] ?Pager [phone]  ? ? ?Jacques Navy ?09/09/2021, 4:22 PM ? ?

## 2021-09-09 NOTE — Discharge Summary (Addendum)
?Physician Discharge Summary ?  ?Patient: Stacey Armstrong MRN: [id number] DOB: 1928/01/09  ?Admit date:     09/04/2021  ?Discharge date: 09/09/21  ?Discharge Physician: Oswald Hillock  ? ?PCP: [name], Rayford Halsted, MD  ? ?Recommendations at discharge:  ? ?Follow-up PCP in 2 weeks ?Scattered pulmonary nodules seen on CT chest, follow-up CT chest without contrast in 3 to 6 months as outpatient ? ?Discharge Diagnoses: ?Principal Problem: ?  Acute viral bronchitis ?Active Problems: ?  Type 2 diabetes mellitus with hyperlipidemia ([place]) ?  Essential hypertension ?  Bronchitis ?  CKD (chronic kidney disease) stage 4, GFR 15-29 ml/min (HCC) ?  Pulmonary nodule ?  Weakness ?  Acute respiratory failure ([place]) ? ?Resolved Problems: ?  * No resolved hospital problems. * ? ?Hospital Course: ? ?88 y.o. female with medical history significant for type 2 diabetes, former tobacco user (quit 23 years ago), history of DVT after hysterectomy off anticoagulation, CKD 4, GERD, TIA, hyperlipidemia, hypertension, OSA, urolithiasis post cystoscopy on 08/14/2021, right ureteral stent placement due to right obstructing UPJ urolith, recently admitted for AKI secondary to dehydration and discharged on 08/22/2021 who presents from home to [facility] ED with complaints of worsening productive cough and nasal congestion.  She went on a trip to [place] and [place] and returned home 4/11.  After returning home she had worsening productive cough and wheezing.  Also had bilateral lower extremity edema which has now improved after IV Lasix administered in the ED.  She received albuterol neb treatment x1, IV Lasix 20 mg x 1 and 200 mg Tessalon Perles x1.  Due to elevated D-dimer CTA chest was done which was negative for pulmonary embolism.  Patient tested positive for rhinovirus on respiratory viral panel.  Patient was admitted for viral bronchitis. ? ?Assessment and Plan: ? ?Acute viral bronchitis ?-Positive rhinovirus ?-Significantly improved, weaned to room  air ?-We will discharge on prednisone taper for 5 more days ?-Continue DuoNeb nebulizers every 6 hours as needed ?-Continue Pulmicort nebulizer twice a day ? ?Acute exacerbation of COPD/acute respiratory failure ?-Likely viral syndrome triggered COPD exacerbation ?-Currently not requiring oxygen ?-Completed 5 days of doxycycline in the hospital ?-We will discharge on prednisone taper as above ? ?Hypertension ?-Continue Norvasc, Zebeta ? ?CKD stage IV ?-Creatinine stable at 1.62, at baseline ? ?Pulmonary nodule seen on CT ?-Need outpatient follow-up ? ?Generalized weakness ?-PT recommending home health PT ? ? ?  ? ? ?Consultants:  ?Procedures performed:  ?Disposition: Home ?Diet recommendation:  ?Discharge Diet Orders (From admission, onward)  ? ?  Start     Ordered  ? 09/09/21 0000  Diet - low sodium heart healthy       ? 09/09/21 1140  ? ?  ?  ? ?  ? ?Regular diet ?DISCHARGE MEDICATION: ?Allergies as of 09/09/2021   ? ?   Reactions  ? Coconut (cocos Nucifera) Allergy Skin Test Hives  ? Penicillins Hives  ? Bactrim [sulfamethoxazole-trimethoprim] Nausea And Vomiting  ? Other Hives  ? Berries   ? ?  ? ?  ?Medication List  ?  ? ?TAKE these medications   ? ?albuterol 108 (90 Base) MCG/ACT inhaler ?Commonly known as: VENTOLIN HFA ?Inhale 2 puffs into the lungs every 6 (six) hours as needed for wheezing or shortness of breath. ?  ?amitriptyline 25 MG tablet ?Commonly known as: ELAVIL ?Take 1 tablet every third night ?What changed:  ?how much to take ?how to take this ?when to take this ?additional instructions ?  ?  amLODipine 10 MG tablet ?Commonly known as: NORVASC ?Take 1 tablet (10 mg total) by mouth daily. ?  ?aspirin EC 81 MG tablet ?Take 81 mg by mouth in the morning. ?  ?atorvastatin 20 MG tablet ?Commonly known as: LIPITOR ?Take 1 tablet (20 mg total) by mouth daily. ?What changed: when to take this ?  ?B-D SINGLE USE SWABS REGULAR Pads ?USE AS DIRECTED EVERY DAY ?  ?bisoprolol 5 MG tablet ?Commonly known as:  ZEBETA ?Take 1 tablet (5 mg total) by mouth daily. ?  ?budesonide 0.25 MG/2ML nebulizer solution ?Commonly known as: PULMICORT ?Take 2 mLs (0.25 mg total) by nebulization 2 (two) times daily. ?  ?docusate sodium 100 MG capsule ?Commonly known as: Colace ?Take 1 capsule (100 mg total) by mouth 2 (two) times daily. ?  ?Fish Oil 1000 MG Caps ?Take 1,000 mg by mouth 2 (two) times daily with a meal. ?  ?guaiFENesin 600 MG 12 hr tablet ?Commonly known as: [place] ?Take 1 tablet (600 mg total) by mouth 2 (two) times daily. ?  ?ipratropium 0.03 % nasal spray ?Commonly known as: ATROVENT ?Place 1 spray into both nostrils 3 (three) times daily as needed (congestion/allergies.). ?  ?ipratropium-albuterol 0.5-2.5 (3) MG/3ML Soln ?Commonly known as: DUONEB ?Take 3 mLs by nebulization every 6 (six) hours as needed. ?  ?multivitamin with minerals Tabs tablet ?Take 1 tablet by mouth daily. Centrum Silver ?  ?mupirocin ointment 2 % ?Commonly known as: BACTROBAN ?Apply 1 application. topically daily as needed (nose rawness.). ?  ?polyethylene glycol powder 17 GM/SCOOP powder ?Commonly known as: GLYCOLAX/MIRALAX ?Take 17 g by mouth 2 (two) times daily as needed. ?What changed: when to take this ?  ?predniSONE 10 MG tablet ?Commonly known as: DELTASONE ?Prednisone 40 mg po daily x 1 day then Prednisone 30 mg po daily x 1 day then Prednisone 20 mg po daily x 1 day then Prednisone 10 mg daily x 1 day then stop... ?  ?PROBIOTIC PO ?Take 1 capsule by mouth in the morning. ?  ?Systane Ultra 0.4-0.3 % Soln ?Generic drug: Polyethyl Glycol-Propyl Glycol ?Place 1-2 drops into both eyes 3 (three) times daily as needed (dry/irritated eyes.). ?  ?traMADol 50 MG tablet ?Commonly known as: ULTRAM ?Take 50-100 mg by mouth every 6 (six) hours as needed for severe pain. ?  ?True Metrix Blood Glucose Test test strip ?Generic drug: glucose blood ?1 each by Other route daily. Dx E11.9 ?  ?True Metrix Meter Devi ?1 each by Does not apply route daily. ?   ?TRUEplus Lancets 33G Misc ?TEST BLOOD SUGAR EVERY DAY ?  ?Vitamin D3 50 MCG (2000 UT) Tabs ?Take 2,000 Units by mouth in the morning. ?  ? ?  ? ?  ?  ? ? ?  ?Durable Medical Equipment  ?(From admission, onward)  ?  ? ? ?  ? ?  Start     Ordered  ? 09/09/21 1152  For home use only DME Nebulizer machine  Once       ?Question Answer Comment  ?Patient needs a nebulizer to treat with the following condition Hypoxia   ?Length of Need Lifetime   ?  ? 09/09/21 1151  ? ?  ?  ? ?  ? ? Follow-up Information   ? ? [facility] Follow up.   ?Specialty: Physical Therapy ?Why: [facility] will provide HHPT at [facility]. ?Contact information: ?[address] ?[place] [address] ?[phone] ? ? ?  ?  ? ? [name], Ubly  Y, MD Follow up in 2 week(s).   ?Specialty: Internal Medicine ?Contact information: ?[address] ?[place] [address] ?[phone] ? ? ?  ?  ? ?  ?  ? ?  ? ?Discharge Exam: ?Danley Danker Weights  ? 09/04/21 1843  ?Weight: 64.5 kg  ? ?General-appears in no acute distress ?Heart-S1-S2, regular, no murmur auscultated ?Lungs-clear to auscultation bilaterally, no wheezing or crackles auscultated ?Abdomen-soft, nontender, no organomegaly ?Extremities-no edema in the lower extremities ?Neuro-alert, oriented x3, no focal deficit noted ? ?Condition at discharge: good ? ?The results of significant diagnostics from this hospitalization (including imaging, microbiology, ancillary and laboratory) are listed below for reference.  ? ?Imaging Studies: ?DG Chest 2 View ? ?Result Date: 09/04/2021 ?CLINICAL DATA:  Cough and runny nose. EXAM: CHEST - 2 VIEW COMPARISON:  None. FINDINGS: The lungs are hyperinflated. Diffuse, chronic appearing increased interstitial lung markings are seen. Mild atelectasis is noted within the left lung base. There is no evidence of a pleural effusion or pneumothorax. The heart size and mediastinal contours are within normal limits. There is marked severity calcification  of the aortic arch. Radiopaque surgical clips are noted within the right upper quadrant. Multilevel degenerative changes seen throughout the thoracic spine. IMPRESSION: Chronic appearing increased interstitial

## 2021-09-10 ENCOUNTER — Telehealth: Payer: Self-pay | Admitting: Internal Medicine

## 2021-09-10 ENCOUNTER — Telehealth: Payer: Self-pay

## 2021-09-10 LAB — GLUCOSE, CAPILLARY: Glucose-Capillary: 161 mg/dL — ABNORMAL HIGH (ref 70–99)

## 2021-09-10 NOTE — Telephone Encounter (Addendum)
Transition Care Management Unsuccessful Follow-up Telephone Call ? ?Date of discharge and from where:  [facility] 09-09-21 Dx: acute viral bronchitis ? ?Attempts:  1st Attempt ? ?Reason for unsuccessful TCM follow-up call:  Left voice message- appt needs to be moved up to be within the 14 day window ? ?Transition Care Management Unsuccessful Follow-up Telephone Call ? ?Date of discharge and from where:  [facility] 09-09-21 Dx: acute viral bronchitis ? ?Attempts:  2nd Attempt ? ?Reason for unsuccessful TCM follow-up call:  Left voice message ? ?  ? ?  ?

## 2021-09-10 NOTE — Telephone Encounter (Signed)
Pt daughter call and stated pt need a appt within 7 to 10 days for a hospital f/u but dr. Jerilee Hoh don't have anything can you advise.she have one 09/24/21 but need to be seen sooner. ?

## 2021-09-11 ENCOUNTER — Telehealth: Payer: Self-pay

## 2021-09-11 NOTE — Telephone Encounter (Signed)
Per daughter Nevin Bloodgood ? ?Transition Care Management Follow-up Telephone Call ?Date of discharge and from where: 09/09/2021 [facility] ?How have you been since you were released from the hospital? Doing better. PT came in yesterday coming back today. ?Any questions or concerns? No ? ?Items Reviewed: ?Did the pt receive and understand the discharge instructions provided? Yes  ?Medications obtained and verified? Yes  ?Other? No  ?Any new allergies since your discharge? No  ?Dietary orders reviewed? Yes ?Do you have support at home? Yes  ? ?Home Care and Equipment/Supplies: ?Were home health services ordered? yes ?If so, what is the name of the agency?   ?Has the agency set up a time to come to the patient's home? yes ?Were any new equipment or medical supplies ordered?  No ?What is the name of the medical supply agency?  ?Were you able to get the supplies/equipment? not applicable ?Do you have any questions related to the use of the equipment or supplies? No ? ?Functional Questionnaire: (I = Independent and D = Dependent) ?ADLs: i ? ?Bathing/Dressing- i ? ?Meal Prep- d ? ?Eating- i ? ?Maintaining continence- i ? ?Transferring/Ambulation- i ? ?Managing Meds- daughter helping ? ?Follow up appointments reviewed: ? ?PCP Hospital f/u appt confirmed? Yes  Scheduled to see Dr. Jerilee Hoh on 09/24/2021 @ 2:30. ?Are transportation arrangements needed? No  ?If their condition worsens, is the pt aware to call PCP or go to the Emergency Dept.? Yes ?Was the patient provided with contact information for the PCP's office or ED? Yes ?Was to pt encouraged to call back with questions or concerns? Yes ? ?

## 2021-09-11 NOTE — Telephone Encounter (Signed)
Spoke with daughter and a follow up hospital appointment scheduled ?

## 2021-09-12 ENCOUNTER — Telehealth: Payer: Self-pay | Admitting: Internal Medicine

## 2021-09-12 ENCOUNTER — Ambulatory Visit (INDEPENDENT_AMBULATORY_CARE_PROVIDER_SITE_OTHER): Payer: Medicare HMO | Admitting: Internal Medicine

## 2021-09-12 VITALS — BP 140/70 | HR 73 | Temp 98.4°F | Wt 151.9 lb

## 2021-09-12 DIAGNOSIS — J208 Acute bronchitis due to other specified organisms: Secondary | ICD-10-CM

## 2021-09-12 DIAGNOSIS — Z09 Encounter for follow-up examination after completed treatment for conditions other than malignant neoplasm: Secondary | ICD-10-CM

## 2021-09-12 DIAGNOSIS — R911 Solitary pulmonary nodule: Secondary | ICD-10-CM | POA: Diagnosis not present

## 2021-09-12 DIAGNOSIS — J9601 Acute respiratory failure with hypoxia: Secondary | ICD-10-CM | POA: Diagnosis not present

## 2021-09-12 MED ORDER — BENZONATATE 100 MG PO CAPS: 100.0000 mg | ORAL_CAPSULE | Freq: Three times a day (TID) | ORAL | 0 refills | Status: DC | PRN

## 2021-09-12 NOTE — Telephone Encounter (Signed)
Pt daughter requesting prescription for  benzonatate (TESSALON) 100 MG capsule be sent to [id number] - [place], [place] [address]. AT [address] as soon as possible ?

## 2021-09-12 NOTE — Progress Notes (Signed)
? ? ? ?Hospital follow-up visit ? ? ? ? ?CC/Reason for Visit: Hospitalization follow-up ? ?HPI: Stacey Armstrong is a 88 y.o. female who is coming in today for the above mentioned reasons, specifically transitional care services face-to-face visit.   ? ?Dates hospitalized: 09/04/2021-09/09/2021 ?Days since discharge from hospital: 3 ?Patient was discharged from the hospital to: Home ?Reason for admission to hospital: Acute hypoxemic respiratory failure due to acute viral bronchitis ?Date of interactive phone contact with patient and/or caregiver: First and second attempts on 4/18, successful on 4/20 ? ?I have reviewed in detail patient's discharge summary plus pertinent specific notes, labs, and images from the hospitalization.  Yes ?Patient presented to the hospital on 4/12 due to dyspnea on exertion.  In the hospital she was found to be hypoxemic, chest x-ray without infiltrates.  She did have a CT scan to rule out PE given her hypoxemia and her recent plane travel to [place].  It was negative for PE but did have some pulmonary nodules for which a 3 to 47-month follow-up CT scan has been requested.  She has been discharged with prednisone, albuterol.  She is feeling overall improved although still has significant nonproductive coughing.  She is requesting a cough prescription. ? ?Medication reconciliation was done today and patient is taking meds as recommended by discharging hospitalist/specialist.  Yes ? ? ?Past Medical/Surgical History: ?Past Medical History:  ?Diagnosis Date  ? Arthritis   ? Complication of anesthesia   ? after gallbladder surgery with extubation had shallow respirations,  needed to continue intudation for additonal 10-12 days  ? Diabetes ([place])   ? DVT (deep venous thrombosis) ([place])   ? left leg after hysterectomy  ? GERD (gastroesophageal reflux disease)   ? History of COVID-19 05/2021  ? History of kidney stones   ? History of transient ischemic attack (TIA)   ? HOH (hard of hearing)   ?  Hyperlipidemia   ? Hypertension   ? Sleep apnea   ? ? ?Past Surgical History:  ?Procedure Laterality Date  ? ABDOMINAL HYSTERECTOMY    ? APPENDECTOMY    ? CATARACT EXTRACTION, BILATERAL    ? CHOLECYSTECTOMY    ? COLONOSCOPY    ? CYSTOSCOPY WITH STENT PLACEMENT Right 08/14/2021  ? Procedure: CYSTOSCOPY WITH STENT PLACEMENT;  Surgeon: Ardis Hughs, MD;  Location: [facility];  Service: Urology;  Laterality: Right;  ONLY NEEDS 30 MIN  ? ? ?Social History: ? reports that she has quit smoking. She has never used smokeless tobacco. She reports that she does not currently use alcohol. She reports that she does not use drugs. ? ?Allergies: ?Allergies  ?Allergen Reactions  ? Coconut (Cocos Nucifera) Allergy Skin Test Hives  ? Penicillins Hives  ? Bactrim [Sulfamethoxazole-Trimethoprim] Nausea And Vomiting  ? Other Hives  ?  Berries   ? ? ?Family History:  ?Family History  ?Problem Relation Age of Onset  ? CAD Maternal Grandmother   ? Pneumonia Maternal Grandfather   ? ? ? ?Current Outpatient Medications:  ?  albuterol (VENTOLIN HFA) 108 (90 Base) MCG/ACT inhaler, Inhale 2 puffs into the lungs every 6 (six) hours as needed for wheezing or shortness of breath., Disp: 8 g, Rfl: 2 ?  Alcohol Swabs (B-D SINGLE USE SWABS REGULAR) PADS, USE AS DIRECTED EVERY DAY, Disp: 100 each, Rfl: 0 ?  amitriptyline (ELAVIL) 25 MG tablet, Take 1 tablet every third night (Patient taking differently: Take 25 mg by mouth See admin instructions. Take 25mg  by mouth every third night),  Disp: 45 tablet, Rfl: 1 ?  amLODipine (NORVASC) 10 MG tablet, Take 1 tablet (10 mg total) by mouth daily., Disp: 30 tablet, Rfl: 3 ?  aspirin EC 81 MG tablet, Take 81 mg by mouth in the morning., Disp: , Rfl:  ?  atorvastatin (LIPITOR) 20 MG tablet, Take 1 tablet (20 mg total) by mouth daily. (Patient taking differently: Take 20 mg by mouth at bedtime.), Disp: 90 tablet, Rfl: 1 ?  benzonatate (TESSALON) 100 MG capsule, Take 1 capsule (100 mg total) by mouth 3 (three)  times daily as needed for cough., Disp: 30 capsule, Rfl: 0 ?  bisoprolol (ZEBETA) 5 MG tablet, Take 1 tablet (5 mg total) by mouth daily., Disp: 90 tablet, Rfl: 1 ?  Blood Glucose Monitoring Suppl (TRUE METRIX METER) DEVI, 1 each by Does not apply route daily., Disp: 1 each, Rfl: 1 ?  budesonide (PULMICORT) 0.25 MG/2ML nebulizer solution, Take 2 mLs (0.25 mg total) by nebulization 2 (two) times daily., Disp: 60 mL, Rfl: 12 ?  Cholecalciferol (VITAMIN D3) 50 MCG (2000 UT) TABS, Take 2,000 Units by mouth in the morning., Disp: , Rfl:  ?  docusate sodium (COLACE) 100 MG capsule, Take 1 capsule (100 mg total) by mouth 2 (two) times daily., Disp: 60 capsule, Rfl: 11 ?  glucose blood (TRUE METRIX BLOOD GLUCOSE TEST) test strip, 1 each by Other route daily. Dx E11.9, Disp: 100 each, Rfl: 12 ?  guaiFENesin (MUCINEX) 600 MG 12 hr tablet, Take 1 tablet (600 mg total) by mouth 2 (two) times daily., Disp: 10 tablet, Rfl: 0 ?  ipratropium (ATROVENT) 0.03 % nasal spray, Place 1 spray into both nostrils 3 (three) times daily as needed (congestion/allergies.)., Disp: , Rfl:  ?  ipratropium-albuterol (DUONEB) 0.5-2.5 (3) MG/3ML SOLN, Take 3 mLs by nebulization every 6 (six) hours as needed., Disp: 360 mL, Rfl: 1 ?  ipratropium-albuterol (DUONEB) 0.5-2.5 (3) MG/3ML SOLN, Use 82ml's via nebulizer every 6 hours as needed, Disp: 360 mL, Rfl: 1 ?  Multiple Vitamin (MULTIVITAMIN WITH MINERALS) TABS tablet, Take 1 tablet by mouth daily. Centrum Silver, Disp: , Rfl:  ?  mupirocin ointment (BACTROBAN) 2 %, Apply 1 application. topically daily as needed (nose rawness.)., Disp: , Rfl:  ?  Omega-3 Fatty Acids (FISH OIL) 1000 MG CAPS, Take 1,000 mg by mouth 2 (two) times daily with a meal., Disp: , Rfl:  ?  Polyethyl Glycol-Propyl Glycol (SYSTANE ULTRA) 0.4-0.3 % SOLN, Place 1-2 drops into both eyes 3 (three) times daily as needed (dry/irritated eyes.)., Disp: , Rfl:  ?  polyethylene glycol powder (GLYCOLAX/MIRALAX) 17 GM/SCOOP powder, Take 17 g  by mouth 2 (two) times daily as needed. (Patient taking differently: Take 17 g by mouth in the morning.), Disp: 3350 g, Rfl: 1 ?  predniSONE (DELTASONE) 10 MG tablet, Prednisone 40 mg po daily x 1 day then Prednisone 30 mg po daily x 1 day then Prednisone 20 mg po daily x 1 day then Prednisone 10 mg daily x 1 day then stop..., Disp: 10 tablet, Rfl: 0 ?  Probiotic Product (PROBIOTIC PO), Take 1 capsule by mouth in the morning., Disp: , Rfl:  ?  TRUEplus Lancets 33G MISC, TEST BLOOD SUGAR EVERY DAY, Disp: 100 each, Rfl: 1 ? ?Review of Systems:  ?Constitutional: Denies fever, chills, diaphoresis, appetite change. ?HEENT: Denies photophobia, eye pain, redness, mouth sores, trouble swallowing, neck pain, neck stiffness and tinnitus.   ?Respiratory: Denies  chest tightness,  and wheezing.   ?Cardiovascular: Denies chest pain,  palpitations and leg swelling.  ?Gastrointestinal: Denies nausea, vomiting, abdominal pain, diarrhea, constipation, blood in stool and abdominal distention.  ?Genitourinary: Denies dysuria, urgency, frequency, hematuria, flank pain and difficulty urinating.  ?Endocrine: Denies: hot or cold intolerance, sweats, changes in hair or nails, polyuria, polydipsia. ?Musculoskeletal: Denies myalgias, back pain, joint swelling, arthralgias and gait problem.  ?Skin: Denies pallor, rash and wound.  ?Neurological: Denies dizziness, seizures, syncope, weakness, light-headedness, numbness and headaches.  ?Hematological: Denies adenopathy. Easy bruising, personal or family bleeding history  ?Psychiatric/Behavioral: Denies suicidal ideation, mood changes, confusion, nervousness, sleep disturbance and agitation ? ? ? ?Physical Exam: ?Vitals:  ? 09/12/21 1529  ?BP: 140/70  ?Pulse: 73  ?Temp: 98.4 ?F (36.9 ?C)  ?TempSrc: Oral  ?SpO2: 96%  ?Weight: 151 lb 14.4 oz (68.9 kg)  ? ? ?Body mass index is 26.91 kg/m?. ? ? ?Constitutional: NAD, calm, comfortable ?Eyes: PERRL, lids and conjunctivae normal ?ENMT: Mucous membranes  are moist.  ?Respiratory: clear to auscultation bilaterally, no wheezing, no crackles. Normal respiratory effort. No accessory muscle use.  ?Cardiovascular: Regular rate and rhythm, no murmurs / rubs / gallops.

## 2021-09-13 MED ORDER — BENZONATATE 100 MG PO CAPS: 100.0000 mg | ORAL_CAPSULE | Freq: Three times a day (TID) | ORAL | 0 refills | Status: DC | PRN

## 2021-09-13 NOTE — Telephone Encounter (Signed)
Medication requested was filled to pharmacy requested today. ?

## 2021-09-23 ENCOUNTER — Telehealth: Payer: Self-pay | Admitting: Internal Medicine

## 2021-09-23 NOTE — Telephone Encounter (Signed)
Pt daughter Nevin Bloodgood is calling to let md know pt has finished steroid no cough and would like to know if she should continue albuterol and nebulizer treatment. Olie ears are clogged up its there a decongestant she can take. Nevin Bloodgood would like a callback by end of today if possible ?

## 2021-09-23 NOTE — Telephone Encounter (Signed)
Spoke with daughter

## 2021-09-24 ENCOUNTER — Inpatient Hospital Stay: Payer: Medicare HMO | Admitting: Internal Medicine

## 2021-10-15 DIAGNOSIS — J342 Deviated nasal septum: Secondary | ICD-10-CM | POA: Diagnosis not present

## 2021-10-15 DIAGNOSIS — H6521 Chronic serous otitis media, right ear: Secondary | ICD-10-CM | POA: Diagnosis not present

## 2021-10-15 DIAGNOSIS — Z974 Presence of external hearing-aid: Secondary | ICD-10-CM | POA: Diagnosis not present

## 2021-10-15 DIAGNOSIS — H9113 Presbycusis, bilateral: Secondary | ICD-10-CM | POA: Diagnosis not present

## 2021-10-15 DIAGNOSIS — J300 Vasomotor rhinitis: Secondary | ICD-10-CM | POA: Diagnosis not present

## 2021-10-15 DIAGNOSIS — H6981 Other specified disorders of Eustachian tube, right ear: Secondary | ICD-10-CM | POA: Diagnosis not present

## 2021-11-03 ENCOUNTER — Other Ambulatory Visit: Payer: Self-pay | Admitting: Internal Medicine

## 2021-11-03 DIAGNOSIS — I10 Essential (primary) hypertension: Secondary | ICD-10-CM

## 2021-11-05 DIAGNOSIS — H6521 Chronic serous otitis media, right ear: Secondary | ICD-10-CM | POA: Diagnosis not present

## 2021-12-24 DIAGNOSIS — N202 Calculus of kidney with calculus of ureter: Secondary | ICD-10-CM | POA: Diagnosis not present

## 2021-12-28 DIAGNOSIS — H66011 Acute suppurative otitis media with spontaneous rupture of ear drum, right ear: Secondary | ICD-10-CM | POA: Diagnosis not present

## 2021-12-28 DIAGNOSIS — H60391 Other infective otitis externa, right ear: Secondary | ICD-10-CM | POA: Diagnosis not present

## 2021-12-29 ENCOUNTER — Other Ambulatory Visit: Payer: Self-pay | Admitting: Internal Medicine

## 2021-12-29 DIAGNOSIS — I10 Essential (primary) hypertension: Secondary | ICD-10-CM

## 2021-12-30 DIAGNOSIS — H35033 Hypertensive retinopathy, bilateral: Secondary | ICD-10-CM | POA: Diagnosis not present

## 2021-12-30 DIAGNOSIS — I10 Essential (primary) hypertension: Secondary | ICD-10-CM | POA: Diagnosis not present

## 2021-12-30 DIAGNOSIS — H353132 Nonexudative age-related macular degeneration, bilateral, intermediate dry stage: Secondary | ICD-10-CM | POA: Diagnosis not present

## 2021-12-30 DIAGNOSIS — H26493 Other secondary cataract, bilateral: Secondary | ICD-10-CM | POA: Diagnosis not present

## 2022-01-09 DIAGNOSIS — H6981 Other specified disorders of Eustachian tube, right ear: Secondary | ICD-10-CM | POA: Diagnosis not present

## 2022-01-09 DIAGNOSIS — J449 Chronic obstructive pulmonary disease, unspecified: Secondary | ICD-10-CM | POA: Diagnosis not present

## 2022-01-09 DIAGNOSIS — H9211 Otorrhea, right ear: Secondary | ICD-10-CM | POA: Diagnosis not present

## 2022-01-09 DIAGNOSIS — H9113 Presbycusis, bilateral: Secondary | ICD-10-CM | POA: Diagnosis not present

## 2022-01-28 DIAGNOSIS — H9211 Otorrhea, right ear: Secondary | ICD-10-CM | POA: Diagnosis not present

## 2022-01-29 ENCOUNTER — Other Ambulatory Visit: Payer: Self-pay | Admitting: Urology

## 2022-01-31 DIAGNOSIS — N200 Calculus of kidney: Secondary | ICD-10-CM | POA: Diagnosis not present

## 2022-02-03 ENCOUNTER — Encounter (HOSPITAL_BASED_OUTPATIENT_CLINIC_OR_DEPARTMENT_OTHER): Payer: Self-pay | Admitting: Urology

## 2022-02-03 NOTE — Progress Notes (Signed)
Pre-op phone call attempted. Called patients cell phone and did not get an answer. Left message on daughters cell phone to call back.

## 2022-02-03 NOTE — Progress Notes (Signed)
Pre-op phone call complete. Procedure date and arrival time verified. Patient allergies, medical history, and medications confirmed. Patient advised to stop Aspirin, Fish Oil, Multi vitamin, and Vitamin D. Patient can have clear liquids until 0515. Patient requested RN speak with daughter about pre-op instructions as well. Daughter did not answer. Will try call again on 9/12. Driver secured.

## 2022-02-04 DIAGNOSIS — H9113 Presbycusis, bilateral: Secondary | ICD-10-CM | POA: Diagnosis not present

## 2022-02-04 DIAGNOSIS — H9211 Otorrhea, right ear: Secondary | ICD-10-CM | POA: Diagnosis not present

## 2022-02-04 DIAGNOSIS — H6981 Other specified disorders of Eustachian tube, right ear: Secondary | ICD-10-CM | POA: Diagnosis not present

## 2022-02-06 ENCOUNTER — Encounter (HOSPITAL_BASED_OUTPATIENT_CLINIC_OR_DEPARTMENT_OTHER)
Admission: RE | Disposition: A | Discharge status: Home / Self Care | Payer: Self-pay | Source: Ambulatory Visit | Attending: Urology

## 2022-02-06 ENCOUNTER — Ambulatory Visit (HOSPITAL_COMMUNITY): Payer: Medicare HMO

## 2022-02-06 ENCOUNTER — Ambulatory Visit (HOSPITAL_BASED_OUTPATIENT_CLINIC_OR_DEPARTMENT_OTHER)
Admission: RE | Admit: 2022-02-06 | Discharge: 2022-02-06 | Disposition: A | Discharge status: Home / Self Care | Payer: Medicare HMO | Source: Ambulatory Visit | Attending: Urology | Admitting: Urology

## 2022-02-06 ENCOUNTER — Encounter (HOSPITAL_BASED_OUTPATIENT_CLINIC_OR_DEPARTMENT_OTHER): Payer: Self-pay | Admitting: Urology

## 2022-02-06 DIAGNOSIS — Z7984 Long term (current) use of oral hypoglycemic drugs: Secondary | ICD-10-CM | POA: Insufficient documentation

## 2022-02-06 DIAGNOSIS — G4730 Sleep apnea, unspecified: Secondary | ICD-10-CM | POA: Diagnosis not present

## 2022-02-06 DIAGNOSIS — Z8673 Personal history of transient ischemic attack (TIA), and cerebral infarction without residual deficits: Secondary | ICD-10-CM | POA: Insufficient documentation

## 2022-02-06 DIAGNOSIS — E1169 Type 2 diabetes mellitus with other specified complication: Secondary | ICD-10-CM

## 2022-02-06 DIAGNOSIS — Z86718 Personal history of other venous thrombosis and embolism: Secondary | ICD-10-CM | POA: Diagnosis not present

## 2022-02-06 DIAGNOSIS — Z466 Encounter for fitting and adjustment of urinary device: Secondary | ICD-10-CM | POA: Diagnosis not present

## 2022-02-06 DIAGNOSIS — E119 Type 2 diabetes mellitus without complications: Secondary | ICD-10-CM | POA: Insufficient documentation

## 2022-02-06 DIAGNOSIS — N200 Calculus of kidney: Secondary | ICD-10-CM | POA: Diagnosis not present

## 2022-02-06 DIAGNOSIS — I10 Essential (primary) hypertension: Secondary | ICD-10-CM | POA: Insufficient documentation

## 2022-02-06 DIAGNOSIS — Z7982 Long term (current) use of aspirin: Secondary | ICD-10-CM | POA: Insufficient documentation

## 2022-02-06 DIAGNOSIS — N201 Calculus of ureter: Secondary | ICD-10-CM | POA: Diagnosis not present

## 2022-02-06 HISTORY — PX: EXTRACORPOREAL SHOCK WAVE LITHOTRIPSY: SHX1557

## 2022-02-06 LAB — GLUCOSE, CAPILLARY
Glucose-Capillary: 134 mg/dL — ABNORMAL HIGH (ref 70–99)
Glucose-Capillary: 94 mg/dL (ref 70–99)

## 2022-02-06 SURGERY — LITHOTRIPSY, ESWL
Anesthesia: LOCAL | Laterality: Right

## 2022-02-06 MED ORDER — SODIUM CHLORIDE 0.9 % IV SOLN: INTRAVENOUS | Status: DC

## 2022-02-06 MED ORDER — CIPROFLOXACIN HCL 500 MG PO TABS
500.0000 mg | ORAL_TABLET | ORAL | Status: AC
  Administered 2022-02-06: 500 mg via ORAL

## 2022-02-06 MED ORDER — DIAZEPAM 5 MG PO TABS: 10.0000 mg | ORAL_TABLET | ORAL | Status: DC

## 2022-02-06 MED ORDER — DIPHENHYDRAMINE HCL 25 MG PO CAPS
25.0000 mg | ORAL_CAPSULE | ORAL | Status: AC
  Administered 2022-02-06: 25 mg via ORAL

## 2022-02-06 MED ORDER — DIPHENHYDRAMINE HCL 25 MG PO CAPS
ORAL_CAPSULE | ORAL | Status: AC
  Filled 2022-02-06: qty 1

## 2022-02-06 MED ORDER — DIAZEPAM 5 MG PO TABS
5.0000 mg | ORAL_TABLET | Freq: Once | ORAL | Status: AC
  Administered 2022-02-06: 5 mg via ORAL

## 2022-02-06 MED ORDER — CIPROFLOXACIN HCL 500 MG PO TABS
ORAL_TABLET | ORAL | Status: AC
  Filled 2022-02-06: qty 1

## 2022-02-06 MED ORDER — DIAZEPAM 5 MG PO TABS
ORAL_TABLET | ORAL | Status: AC
  Filled 2022-02-06: qty 1

## 2022-02-06 NOTE — Op Note (Signed)
See [facility] OP note scanned into chart. Also because of the size, density, location and other factors that cannot be anticipated I feel this will likely be a staged procedure. This fact supersedes any indication in the scanned [facility] operative note to the contrary.  

## 2022-02-06 NOTE — H&P (Signed)
88 year old female seen today on call as an urgent work in. The patient is complaining of right-sided flank pain. She describes her pain as 5 out of 5. She is having associated nausea and vomiting. She threw up once this morning. She was unable to sleep overnight because of the pain. She has not had as much gross hematuria. She denies any dysuria. She denies any fevers or chills.   The patient had a CT scan 6 weeks prior that demonstrated a large UPJ stone in the right kidney. There was some stranding associated renal pelvic caliectasis. The patient saw Dr. Bess Harvest for this and opted for conservative management. However, today the patient's symptoms have progressed and at this point she is no longer able to tolerate the pain.   Interval: The patient is here for reevaluation. In March she underwent a right stent placement. She had some difficulty postop and was admitted for dehydration. She was also admitted subsequently for bronchitis. She is now starting to do better. She is complaining of hematuria, but no dysuria. She seems to be doing relatively well.     ALLERGIES: Bactrim berry family Coconut penicillin    MEDICATIONS: Aspirin  Metformin Hcl 1,000 mg tablet  Amitriptyline Hcl 25 mg tablet  Amlodipine Besylate 5 mg tablet  Atorvastatin Calcium 20 mg tablet  Bisoprolol-Hydrochlorothiazide 2.5 mg-6.25 mg tablet  Centrum Silver Women  Colace  Ipratropium Bromide  Miralax  Mupirocin 2 % ointment  Omega-3 Fish Oil 1,000 mg (120 mg-180 mg) capsule  Probiotic  Vitamin D3     GU PSH: Cystoscopy - 06/07/2021 Cystoscopy Insert Stent - 08/14/2021 Locm 300-399Mg /Ml Iodine,1Ml - 06/28/2021     NON-GU PSH: Remove Gallbladder     GU PMH: Renal and ureteral calculus - 08/13/2021 Gross hematuria - 07/23/2021, - 06/28/2021, - 06/07/2021, - 05/07/2021 Renal calculus - 07/23/2021, - 06/28/2021 Urge incontinence - 07/23/2021, - 06/07/2021, - 05/07/2021 Urinary Frequency - 05/07/2021    NON-GU PMH:  Arthritis DVT, History Heartburn Hypercholesterolemia Hypertension Sleep Apnea Stroke/TIA    FAMILY HISTORY: 2 daughters - Daughter 2 sons - Son   SOCIAL HISTORY: Marital Status: Widowed Current Smoking Status: Patient does not smoke anymore. Smoked for 40 years.   Tobacco Use Assessment Completed: Used Tobacco in last 30 days? Does not use smokeless tobacco. Has never drank.  Drinks 1 caffeinated drink per day. Patient's occupation is/was Retired.    REVIEW OF SYSTEMS:    GU Review Female:   Patient denies frequent urination, hard to postpone urination, burning /pain with urination, get up at night to urinate, leakage of urine, stream starts and stops, trouble starting your stream, have to strain to urinate, and being pregnant.  Gastrointestinal (Upper):   Patient denies nausea, vomiting, and indigestion/ heartburn.  Gastrointestinal (Lower):   Patient denies diarrhea and constipation.  Constitutional:   Patient denies weight loss, fatigue, night sweats, and fever.  Skin:   Patient denies skin rash/ lesion and itching.  Eyes:   Patient denies blurred vision and double vision.  Ears/ Nose/ Throat:   Patient denies sore throat and sinus problems.  Hematologic/Lymphatic:   Patient denies swollen glands and easy bruising.  Cardiovascular:   Patient denies leg swelling and chest pains.  Respiratory:   Patient denies cough and shortness of breath.  Endocrine:   Patient denies excessive thirst.  Musculoskeletal:   Patient reports back pain. Patient denies joint pain.  Neurological:   Patient denies headaches and dizziness.  Psychologic:   Patient denies depression and anxiety.  Notes: Pt. cont. to have a little blood in the urine - slightly pink visibly . No other symptoms noted today . Reports some dull pain off and on right flank area .     VITAL SIGNS:      12/24/2021 01:21 PM  BP 179/62 mmHg  Heart Rate 72 /min  Temperature 97.7 F / 36.5 C   Complexity of Data:  Source  Of History:  Patient  Records Review:   Previous Doctor Records, Previous Patient Records, POC Tool  Urine Test Review:   Urinalysis  X-Ray Review: C.T. Abdomen/Pelvis: Reviewed Films. Discussed With Patient.     PROCEDURES:          Urinalysis w/Scope Dipstick Dipstick Cont'd Micro  Color: Amber Bilirubin: Neg mg/dL WBC/hpf: 6 - 10/hpf  Appearance: Cloudy Ketones: Neg mg/dL RBC/hpf: Packed/hpf  Specific Gravity: 1.025 Blood: 3+ ery/uL Bacteria: Few (10-25/hpf)  pH: 6.5 Protein: 1+ mg/dL Cystals: NS (Not Seen)  Glucose: Neg mg/dL Urobilinogen: 0.2 mg/dL Casts: NS (Not Seen)    Nitrites: Neg Trichomonas: Not Present    Leukocyte Esterase: 2+ leu/uL Mucous: Not Present      Epithelial Cells: 6 - 10/hpf      Yeast: NS (Not Seen)      Sperm: Not Present    ASSESSMENT:      ICD-10 Details  1 GU:   Renal and ureteral calculus - N20.2      PLAN:            Medications Stop Meds: Tramadol Hcl 50 mg tablet 1-2 tablet PO Q 6 H PRN  Start: 08/13/2021  Discontinue: 12/24/2021  - Reason: The medication cycle was completed.            Orders Labs Urine Culture          Schedule Return Visit/Planned Activity: ASAP - Schedule Surgery          Document Letter(s):  Created for Patient: Clinical Summary         Notes:   I went through the treatment options for her for her right UPJ stone. We discussed shockwave lithotripsy versus ureteroscopy. After going through all of it the patient and her daughter and opted to proceed with ureteroscopy. We will reach out to her primary care provider ensure that she is optimized medically. We will then try to get her scheduled around her trip to [place].

## 2022-02-06 NOTE — Discharge Instructions (Signed)
See [facility] discharge instructions in chart.  

## 2022-02-07 ENCOUNTER — Encounter (HOSPITAL_BASED_OUTPATIENT_CLINIC_OR_DEPARTMENT_OTHER): Payer: Self-pay | Admitting: Urology

## 2022-02-09 DIAGNOSIS — J449 Chronic obstructive pulmonary disease, unspecified: Secondary | ICD-10-CM | POA: Diagnosis not present

## 2022-02-11 ENCOUNTER — Ambulatory Visit (INDEPENDENT_AMBULATORY_CARE_PROVIDER_SITE_OTHER): Payer: Medicare HMO | Admitting: Internal Medicine

## 2022-02-11 ENCOUNTER — Encounter: Payer: Self-pay | Admitting: Internal Medicine

## 2022-02-11 VITALS — BP 178/78 | HR 63 | Temp 98.3°F | Ht 63.0 in | Wt 152.8 lb

## 2022-02-11 DIAGNOSIS — R6883 Chills (without fever): Secondary | ICD-10-CM

## 2022-02-11 DIAGNOSIS — M79601 Pain in right arm: Secondary | ICD-10-CM | POA: Diagnosis not present

## 2022-02-11 DIAGNOSIS — N3001 Acute cystitis with hematuria: Secondary | ICD-10-CM | POA: Diagnosis not present

## 2022-02-11 DIAGNOSIS — R600 Localized edema: Secondary | ICD-10-CM | POA: Diagnosis not present

## 2022-02-11 DIAGNOSIS — E1169 Type 2 diabetes mellitus with other specified complication: Secondary | ICD-10-CM | POA: Diagnosis not present

## 2022-02-11 DIAGNOSIS — Z23 Encounter for immunization: Secondary | ICD-10-CM | POA: Diagnosis not present

## 2022-02-11 DIAGNOSIS — I10 Essential (primary) hypertension: Secondary | ICD-10-CM

## 2022-02-11 DIAGNOSIS — E785 Hyperlipidemia, unspecified: Secondary | ICD-10-CM

## 2022-02-11 LAB — POCT URINALYSIS DIPSTICK
Bilirubin, UA: NEGATIVE
Glucose, UA: NEGATIVE
Ketones, UA: POSITIVE
Nitrite, UA: NEGATIVE
Protein, UA: POSITIVE — AB
Spec Grav, UA: 1.025 (ref 1.010–1.025)
Urobilinogen, UA: 1 E.U./dL
pH, UA: 6 (ref 5.0–8.0)

## 2022-02-11 LAB — POCT GLYCOSYLATED HEMOGLOBIN (HGB A1C): Hemoglobin A1C: 6.5 % — AB (ref 4.0–5.6)

## 2022-02-11 MED ORDER — CIPROFLOXACIN HCL 250 MG PO TABS: 250.0000 mg | ORAL_TABLET | Freq: Two times a day (BID) | ORAL | 0 refills | Status: AC

## 2022-02-11 MED ORDER — AMLODIPINE BESYLATE 10 MG PO TABS: 5.0000 mg | ORAL_TABLET | Freq: Every day | ORAL | 1 refills | Status: DC

## 2022-02-11 MED ORDER — FUROSEMIDE 20 MG PO TABS: ORAL_TABLET | ORAL | 3 refills | Status: DC

## 2022-02-11 NOTE — Progress Notes (Signed)
Established Patient Office Visit     CC/Reason for Visit: Discuss acute concerns  HPI: Stacey Armstrong is a 88 y.o. female who is coming in today for the above mentioned reasons. Past Medical History is significant for: Diabetes, hypertension, hyperlipidemia.  She also has nephrolithiasis.  Just last week she had stent placement and lithotripsy.  For the past couple days she has been more lethargic, fatigued and is having chills.  She has also developed significant bilateral lower extremity edema.  Her legs feel tight and painful.  While she was hospitalized over the spring her amlodipine was increased to 10 mg.  She was also taken off her metformin due to her current renal function.  She has not had an A1c since March and is also not on any diabetic medication at this point in time.  She woke up this morning with significant pain and numbness in her right arm that has slowly improved throughout the day.  She is requesting a flu vaccine.   Past Medical/Surgical History: Past Medical History:  Diagnosis Date   Arthritis    Complication of anesthesia    after gallbladder surgery with extubation had shallow respirations,  needed to continue intudation for additonal 10-12 days   Diabetes ([place])    DVT (deep venous thrombosis) (HCC)    left leg after hysterectomy   GERD (gastroesophageal reflux disease)    History of COVID-19 05/2021   History of kidney stones    History of transient ischemic attack (TIA)    HOH (hard of hearing)    Hyperlipidemia    Hypertension    Sleep apnea     Past Surgical History:  Procedure Laterality Date   ABDOMINAL HYSTERECTOMY     APPENDECTOMY     CATARACT EXTRACTION, BILATERAL     CHOLECYSTECTOMY     COLONOSCOPY     CYSTOSCOPY WITH STENT PLACEMENT Right 08/14/2021   Procedure: CYSTOSCOPY WITH STENT PLACEMENT;  Surgeon: Ardis Hughs, MD;  Location: [facility];  Service: Urology;  Laterality: Right;  ONLY NEEDS 30 MIN   EXTRACORPOREAL SHOCK WAVE  LITHOTRIPSY Right 02/06/2022   Procedure: RIGHT EXTRACORPOREAL SHOCK WAVE LITHOTRIPSY (ESWL);  Surgeon: Ardis Hughs, MD;  Location: [facility];  Service: Urology;  Laterality: Right;    Social History:  reports that she has quit smoking. She has never used smokeless tobacco. She reports that she does not currently use alcohol. She reports that she does not use drugs.  Allergies: Allergies  Allergen Reactions   Cocos Nucifera Hives   Penicillins Hives   Bactrim [Sulfamethoxazole-Trimethoprim] Nausea And Vomiting   Other Hives    Berries     Family History:  Family History  Problem Relation Age of Onset   CAD Maternal Grandmother    Pneumonia Maternal Grandfather      Current Outpatient Medications:    Alcohol Swabs (B-D SINGLE USE SWABS REGULAR) PADS, USE AS DIRECTED EVERY DAY, Disp: 100 each, Rfl: 0   amitriptyline (ELAVIL) 25 MG tablet, Take 1 tablet every third night (Patient taking differently: Take 25 mg by mouth See admin instructions. Take 25mg  by mouth every third night), Disp: 45 tablet, Rfl: 1   aspirin EC 81 MG tablet, Take 81 mg by mouth in the morning., Disp: , Rfl:    atorvastatin (LIPITOR) 20 MG tablet, Take 1 tablet (20 mg total) by mouth daily. (Patient taking differently: Take 20 mg by mouth at bedtime.), Disp: 90 tablet, Rfl: 1   bisoprolol (  ZEBETA) 5 MG tablet, TAKE 1 TABLET EVERY DAY, Disp: 90 tablet, Rfl: 1   Blood Glucose Monitoring Suppl (TRUE METRIX METER) DEVI, 1 each by Does not apply route daily., Disp: 1 each, Rfl: 1   Cholecalciferol (VITAMIN D3) 50 MCG (2000 UT) TABS, Take 2,000 Units by mouth in the morning., Disp: , Rfl:    ciprofloxacin (CIPRO) 250 MG tablet, Take 1 tablet (250 mg total) by mouth 2 (two) times daily for 7 days., Disp: 14 tablet, Rfl: 0   docusate sodium (COLACE) 100 MG capsule, Take 1 capsule (100 mg total) by mouth 2 (two) times daily., Disp: 60 capsule, Rfl: 11   furosemide (LASIX) 20 MG tablet, Take on  M-W-F, Disp: 30 tablet, Rfl: 3   glucose blood (TRUE METRIX BLOOD GLUCOSE TEST) test strip, 1 each by Other route daily. Dx E11.9, Disp: 100 each, Rfl: 12   ipratropium (ATROVENT) 0.03 % nasal spray, Place 1 spray into both nostrils 3 (three) times daily as needed (congestion/allergies.)., Disp: , Rfl:    Multiple Vitamins-Minerals (PRESERVISION AREDS 2 PO), Take by mouth in the morning and at bedtime., Disp: , Rfl:    Omega-3 Fatty Acids (FISH OIL) 1000 MG CAPS, Take 1,000 mg by mouth 2 (two) times daily with a meal., Disp: , Rfl:    Polyethyl Glycol-Propyl Glycol (SYSTANE ULTRA) 0.4-0.3 % SOLN, Place 1-2 drops into both eyes 3 (three) times daily as needed (dry/irritated eyes.)., Disp: , Rfl:    polyethylene glycol powder (GLYCOLAX/MIRALAX) 17 GM/SCOOP powder, Take 17 g by mouth 2 (two) times daily as needed. (Patient taking differently: Take 17 g by mouth in the morning.), Disp: 3350 g, Rfl: 1   Probiotic Product (PROBIOTIC PO), Take 1 capsule by mouth in the morning., Disp: , Rfl:    TRUEplus Lancets 33G MISC, TEST BLOOD SUGAR EVERY DAY, Disp: 100 each, Rfl: 1   albuterol (VENTOLIN HFA) 108 (90 Base) MCG/ACT inhaler, Inhale 2 puffs into the lungs every 6 (six) hours as needed for wheezing or shortness of breath. (Patient not taking: Reported on 02/11/2022), Disp: 8 g, Rfl: 2   amLODipine (NORVASC) 10 MG tablet, Take 0.5 tablets (5 mg total) by mouth daily., Disp: 90 tablet, Rfl: 1   budesonide (PULMICORT) 0.25 MG/2ML nebulizer solution, Take 2 mLs (0.25 mg total) by nebulization 2 (two) times daily. (Patient not taking: Reported on 02/11/2022), Disp: 60 mL, Rfl: 12   ipratropium-albuterol (DUONEB) 0.5-2.5 (3) MG/3ML SOLN, Take 3 mLs by nebulization every 6 (six) hours as needed. (Patient not taking: Reported on 02/11/2022), Disp: 360 mL, Rfl: 1   ipratropium-albuterol (DUONEB) 0.5-2.5 (3) MG/3ML SOLN, Use 25ml's via nebulizer every 6 hours as needed (Patient not taking: Reported on 02/11/2022), Disp: 360  mL, Rfl: 1   Multiple Vitamin (MULTIVITAMIN WITH MINERALS) TABS tablet, Take 1 tablet by mouth daily. Centrum Silver (Patient not taking: Reported on 02/11/2022), Disp: , Rfl:   Review of Systems:  Constitutional: Positive for  chills, appetite change and fatigue.  HEENT: Denies photophobia, eye pain, redness, hearing loss, ear pain, congestion, sore throat, rhinorrhea, sneezing, mouth sores, trouble swallowing, neck pain, neck stiffness and tinnitus.   Respiratory: Denies SOB, DOE, cough, chest tightness,  and wheezing.   Cardiovascular: Denies chest pain, palpitations. Gastrointestinal: Denies nausea, vomiting, abdominal pain, diarrhea, constipation, blood in stool and abdominal distention.  Genitourinary: Denies dysuria, urgency,  hematuria, flank pain. Endocrine: Denies: hot or cold intolerance, sweats, changes in hair or nails, polyuria, polydipsia. Musculoskeletal: Denies myalgias, back pain, joint  swelling, arthralgias and gait problem.  Skin: Denies pallor, rash and wound.  Neurological: Denies dizziness, seizures, syncope,  light-headedness, numbness and headaches.  Hematological: Denies adenopathy. Easy bruising, personal or family bleeding history  Psychiatric/Behavioral: Denies suicidal ideation, mood changes, confusion, nervousness, sleep disturbance and agitation    Physical Exam: Vitals:   02/11/22 1604 02/11/22 1607  BP: (!) 180/70 (!) 178/78  Pulse: 63   Temp: 98.3 F (36.8 C)   SpO2: 96%   Weight: 152 lb 12.8 oz (69.3 kg)   Height: $Remove'5\' 3"'gtpoglR$  (1.6 m)     Body mass index is 27.07 kg/m.   Constitutional: NAD, calm, comfortable Eyes: PERRL, lids and conjunctivae normal ENMT: Mucous membranes are moist. Respiratory: clear to auscultation bilaterally, no wheezing, no crackles. Normal respiratory effort. No accessory muscle use.  Cardiovascular: Regular rate and rhythm, no murmurs / rubs / gallops.  3+ pitting lower extremity edema up to knees bilaterally.   Psychiatric:  Normal judgment and insight. Alert and oriented x 3. Normal mood.    Impression and Plan:  Chills - Plan: POCT Urinalysis Dipstick, Urine Culture  Bilateral leg edema - Plan: furosemide (LASIX) 20 MG tablet  Type 2 diabetes mellitus with hyperlipidemia (HCC) - Plan: POCT HgB A1C  Essential hypertension - Plan: amLODipine (NORVASC) 10 MG tablet  Right arm pain  Need for influenza vaccination  Need for immunization against influenza - Plan: Flu Vaccine QUAD High Dose(Fluad)  Acute cystitis with hematuria - Plan: ciprofloxacin (CIPRO) 250 MG tablet  -Flu vaccine administered today. -I think her right arm pain was simply some numbness from sleeping on it as pain has improved throughout the day and is now almost completely resolved. -I am concerned about her significant lower extremity edema, she does not appear to be short of breath.  I will decrease her amlodipine from 10 to 5 mg and add Lasix 20 mg on Monday Wednesdays and Fridays.  She will continue to monitor blood pressure at home and return in 6 weeks for follow-up. -Given her recent urologic procedure and her chills I am concerned about a UTI.  She has no other flulike symptoms.  Urine dipstick done in office today shows blood and leukocytes.  Concerning for UTI, sent for urine culture we will start on Cipro, she has allergies to Bactrim and penicillin.  Time spent:33 minutes reviewing chart, interviewing and examining patient and formulating plan of care.   Patient Instructions  -Nice seeing you today!!  -Decrease amlodipine from 10 to 5 mg daily.  -Start lasix 20 mg on M-W-F.  -Schedule follow up in 6 weeks.      Lelon Frohlich, MD [facility]

## 2022-02-11 NOTE — Patient Instructions (Signed)
-  Nice seeing you today!!  -Decrease amlodipine from 10 to 5 mg daily.  -Start lasix 20 mg on M-W-F.  -Schedule follow up in 6 weeks.

## 2022-02-13 LAB — URINE CULTURE
MICRO NUMBER:: 13941477
SPECIMEN QUALITY:: ADEQUATE

## 2022-02-19 DIAGNOSIS — N200 Calculus of kidney: Secondary | ICD-10-CM | POA: Diagnosis not present

## 2022-02-19 DIAGNOSIS — N202 Calculus of kidney with calculus of ureter: Secondary | ICD-10-CM | POA: Diagnosis not present

## 2022-02-24 ENCOUNTER — Other Ambulatory Visit: Payer: Self-pay | Admitting: Internal Medicine

## 2022-02-24 DIAGNOSIS — E1169 Type 2 diabetes mellitus with other specified complication: Secondary | ICD-10-CM

## 2022-03-03 ENCOUNTER — Other Ambulatory Visit: Payer: Self-pay | Admitting: Internal Medicine

## 2022-03-08 ENCOUNTER — Inpatient Hospital Stay (HOSPITAL_COMMUNITY)
Admission: EM | Admit: 2022-03-08 | Discharge: 2022-03-12 | DRG: 291 | Disposition: A | Discharge status: Home Health | Payer: Medicare HMO | Attending: Internal Medicine | Admitting: Internal Medicine

## 2022-03-08 ENCOUNTER — Emergency Department (HOSPITAL_COMMUNITY): Payer: Medicare HMO

## 2022-03-08 ENCOUNTER — Other Ambulatory Visit: Payer: Self-pay

## 2022-03-08 ENCOUNTER — Encounter (HOSPITAL_COMMUNITY): Payer: Self-pay

## 2022-03-08 DIAGNOSIS — I5033 Acute on chronic diastolic (congestive) heart failure: Secondary | ICD-10-CM | POA: Diagnosis not present

## 2022-03-08 DIAGNOSIS — Z87442 Personal history of urinary calculi: Secondary | ICD-10-CM

## 2022-03-08 DIAGNOSIS — E785 Hyperlipidemia, unspecified: Secondary | ICD-10-CM | POA: Diagnosis present

## 2022-03-08 DIAGNOSIS — R0602 Shortness of breath: Secondary | ICD-10-CM | POA: Diagnosis not present

## 2022-03-08 DIAGNOSIS — E1169 Type 2 diabetes mellitus with other specified complication: Secondary | ICD-10-CM | POA: Diagnosis present

## 2022-03-08 DIAGNOSIS — Z8249 Family history of ischemic heart disease and other diseases of the circulatory system: Secondary | ICD-10-CM | POA: Diagnosis not present

## 2022-03-08 DIAGNOSIS — I160 Hypertensive urgency: Secondary | ICD-10-CM | POA: Diagnosis not present

## 2022-03-08 DIAGNOSIS — E1122 Type 2 diabetes mellitus with diabetic chronic kidney disease: Secondary | ICD-10-CM | POA: Diagnosis present

## 2022-03-08 DIAGNOSIS — J208 Acute bronchitis due to other specified organisms: Secondary | ICD-10-CM

## 2022-03-08 DIAGNOSIS — E860 Dehydration: Secondary | ICD-10-CM | POA: Diagnosis present

## 2022-03-08 DIAGNOSIS — N184 Chronic kidney disease, stage 4 (severe): Secondary | ICD-10-CM | POA: Diagnosis not present

## 2022-03-08 DIAGNOSIS — I10 Essential (primary) hypertension: Secondary | ICD-10-CM | POA: Diagnosis not present

## 2022-03-08 DIAGNOSIS — Z9109 Other allergy status, other than to drugs and biological substances: Secondary | ICD-10-CM

## 2022-03-08 DIAGNOSIS — I130 Hypertensive heart and chronic kidney disease with heart failure and stage 1 through stage 4 chronic kidney disease, or unspecified chronic kidney disease: Secondary | ICD-10-CM | POA: Diagnosis not present

## 2022-03-08 DIAGNOSIS — K5900 Constipation, unspecified: Secondary | ICD-10-CM | POA: Diagnosis present

## 2022-03-08 DIAGNOSIS — J449 Chronic obstructive pulmonary disease, unspecified: Secondary | ICD-10-CM | POA: Diagnosis not present

## 2022-03-08 DIAGNOSIS — R846 Abnormal cytological findings in specimens from respiratory organs and thorax: Secondary | ICD-10-CM | POA: Diagnosis not present

## 2022-03-08 DIAGNOSIS — J918 Pleural effusion in other conditions classified elsewhere: Secondary | ICD-10-CM | POA: Diagnosis present

## 2022-03-08 DIAGNOSIS — R6883 Chills (without fever): Secondary | ICD-10-CM

## 2022-03-08 DIAGNOSIS — R0609 Other forms of dyspnea: Secondary | ICD-10-CM | POA: Diagnosis not present

## 2022-03-08 DIAGNOSIS — J9811 Atelectasis: Secondary | ICD-10-CM | POA: Diagnosis not present

## 2022-03-08 DIAGNOSIS — Z882 Allergy status to sulfonamides status: Secondary | ICD-10-CM | POA: Diagnosis not present

## 2022-03-08 DIAGNOSIS — Z8616 Personal history of COVID-19: Secondary | ICD-10-CM | POA: Diagnosis not present

## 2022-03-08 DIAGNOSIS — J9600 Acute respiratory failure, unspecified whether with hypoxia or hypercapnia: Secondary | ICD-10-CM | POA: Diagnosis present

## 2022-03-08 DIAGNOSIS — R519 Headache, unspecified: Secondary | ICD-10-CM | POA: Diagnosis not present

## 2022-03-08 DIAGNOSIS — Z79899 Other long term (current) drug therapy: Secondary | ICD-10-CM

## 2022-03-08 DIAGNOSIS — J90 Pleural effusion, not elsewhere classified: Secondary | ICD-10-CM | POA: Diagnosis not present

## 2022-03-08 DIAGNOSIS — Z8673 Personal history of transient ischemic attack (TIA), and cerebral infarction without residual deficits: Secondary | ICD-10-CM

## 2022-03-08 DIAGNOSIS — Z87891 Personal history of nicotine dependence: Secondary | ICD-10-CM | POA: Diagnosis not present

## 2022-03-08 DIAGNOSIS — Z87440 Personal history of urinary (tract) infections: Secondary | ICD-10-CM | POA: Diagnosis not present

## 2022-03-08 DIAGNOSIS — Z7982 Long term (current) use of aspirin: Secondary | ICD-10-CM

## 2022-03-08 DIAGNOSIS — K219 Gastro-esophageal reflux disease without esophagitis: Secondary | ICD-10-CM | POA: Diagnosis present

## 2022-03-08 DIAGNOSIS — Z880 Allergy status to penicillin: Secondary | ICD-10-CM

## 2022-03-08 DIAGNOSIS — H9190 Unspecified hearing loss, unspecified ear: Secondary | ICD-10-CM | POA: Diagnosis present

## 2022-03-08 DIAGNOSIS — J9601 Acute respiratory failure with hypoxia: Secondary | ICD-10-CM | POA: Diagnosis not present

## 2022-03-08 DIAGNOSIS — T447X5A Adverse effect of beta-adrenoreceptor antagonists, initial encounter: Secondary | ICD-10-CM | POA: Diagnosis present

## 2022-03-08 DIAGNOSIS — R269 Unspecified abnormalities of gait and mobility: Secondary | ICD-10-CM | POA: Diagnosis present

## 2022-03-08 DIAGNOSIS — Z1152 Encounter for screening for COVID-19: Secondary | ICD-10-CM

## 2022-03-08 DIAGNOSIS — R091 Pleurisy: Secondary | ICD-10-CM | POA: Diagnosis not present

## 2022-03-08 DIAGNOSIS — G4730 Sleep apnea, unspecified: Secondary | ICD-10-CM | POA: Diagnosis present

## 2022-03-08 DIAGNOSIS — I6381 Other cerebral infarction due to occlusion or stenosis of small artery: Secondary | ICD-10-CM | POA: Diagnosis not present

## 2022-03-08 DIAGNOSIS — R001 Bradycardia, unspecified: Secondary | ICD-10-CM | POA: Diagnosis present

## 2022-03-08 LAB — COMPREHENSIVE METABOLIC PANEL
ALT: 23 U/L (ref 0–44)
AST: 22 U/L (ref 15–41)
Albumin: 3 g/dL — ABNORMAL LOW (ref 3.5–5.0)
Alkaline Phosphatase: 114 U/L (ref 38–126)
Anion gap: 4 — ABNORMAL LOW (ref 5–15)
BUN: 18 mg/dL (ref 8–23)
CO2: 29 mmol/L (ref 22–32)
Calcium: 8.9 mg/dL (ref 8.9–10.3)
Chloride: 106 mmol/L (ref 98–111)
Creatinine, Ser: 1.19 mg/dL — ABNORMAL HIGH (ref 0.44–1.00)
GFR, Estimated: 43 mL/min — ABNORMAL LOW (ref >60)
Glucose, Bld: 146 mg/dL — ABNORMAL HIGH (ref 70–99)
Potassium: 3.5 mmol/L (ref 3.5–5.1)
Sodium: 139 mmol/L (ref 135–145)
Total Bilirubin: 0.7 mg/dL (ref 0.3–1.2)
Total Protein: 6 g/dL — ABNORMAL LOW (ref 6.5–8.1)

## 2022-03-08 LAB — CBC WITH DIFFERENTIAL/PLATELET
Abs Immature Granulocytes: 0.04 10*3/uL (ref 0.00–0.07)
Basophils Absolute: 0.1 10*3/uL (ref 0.0–0.1)
Basophils Relative: 1 %
Eosinophils Absolute: 0.1 10*3/uL (ref 0.0–0.5)
Eosinophils Relative: 2 %
HCT: 37.1 % (ref 36.0–46.0)
Hemoglobin: 11.6 g/dL — ABNORMAL LOW (ref 12.0–15.0)
Immature Granulocytes: 1 %
Lymphocytes Relative: 21 %
Lymphs Abs: 1.5 10*3/uL (ref 0.7–4.0)
MCH: 27.4 pg (ref 26.0–34.0)
MCHC: 31.3 g/dL (ref 30.0–36.0)
MCV: 87.5 fL (ref 80.0–100.0)
Monocytes Absolute: 0.7 10*3/uL (ref 0.1–1.0)
Monocytes Relative: 10 %
Neutro Abs: 4.6 10*3/uL (ref 1.7–7.7)
Neutrophils Relative %: 65 %
Platelets: 292 10*3/uL (ref 150–400)
RBC: 4.24 MIL/uL (ref 3.87–5.11)
RDW: 15 % (ref 11.5–15.5)
WBC: 7 10*3/uL (ref 4.0–10.5)
nRBC: 0 % (ref 0.0–0.2)

## 2022-03-08 LAB — URINALYSIS, ROUTINE W REFLEX MICROSCOPIC
Bilirubin Urine: NEGATIVE
Glucose, UA: NEGATIVE mg/dL
Hgb urine dipstick: NEGATIVE
Ketones, ur: NEGATIVE mg/dL
Nitrite: NEGATIVE
Protein, ur: 100 mg/dL — AB
Specific Gravity, Urine: 1.006 (ref 1.005–1.030)
pH: 6 (ref 5.0–8.0)

## 2022-03-08 LAB — TROPONIN I (HIGH SENSITIVITY)
Troponin I (High Sensitivity): 17 ng/L (ref <18)
Troponin I (High Sensitivity): 16 ng/L (ref <18)

## 2022-03-08 LAB — BRAIN NATRIURETIC PEPTIDE: B Natriuretic Peptide: 304.8 pg/mL — ABNORMAL HIGH (ref 0.0–100.0)

## 2022-03-08 LAB — RESP PANEL BY RT-PCR (FLU A&B, COVID) ARPGX2
Influenza A by PCR: NEGATIVE
Influenza B by PCR: NEGATIVE
SARS Coronavirus 2 by RT PCR: NEGATIVE

## 2022-03-08 MED ORDER — FUROSEMIDE 10 MG/ML IJ SOLN
20.0000 mg | Freq: Once | INTRAMUSCULAR | Status: AC
  Administered 2022-03-08: 20 mg via INTRAVENOUS
  Filled 2022-03-08: qty 4

## 2022-03-08 MED ORDER — NITROGLYCERIN IN D5W 200-5 MCG/ML-% IV SOLN
0.0000 ug/min | INTRAVENOUS | Status: DC
  Administered 2022-03-08: 10 ug/min via INTRAVENOUS
  Filled 2022-03-08: qty 250

## 2022-03-08 MED ORDER — IOHEXOL 350 MG/ML SOLN
75.0000 mL | Freq: Once | INTRAVENOUS | Status: AC | PRN
  Administered 2022-03-08: 75 mL via INTRAVENOUS

## 2022-03-08 MED ORDER — POTASSIUM CHLORIDE CRYS ER 20 MEQ PO TBCR
40.0000 meq | EXTENDED_RELEASE_TABLET | Freq: Once | ORAL | Status: AC
  Administered 2022-03-08: 40 meq via ORAL
  Filled 2022-03-08: qty 2

## 2022-03-08 MED ORDER — SODIUM CHLORIDE 0.9 % IV SOLN
1.0000 g | Freq: Once | INTRAVENOUS | Status: AC
  Administered 2022-03-08: 1 g via INTRAVENOUS
  Filled 2022-03-08: qty 10

## 2022-03-08 NOTE — ED Provider Notes (Signed)
[facility] DEPT Provider Note   CSN: [id number] Arrival date & time: 03/08/22  1541     History  Chief Complaint  Patient presents with   Hypertension    Stacey Armstrong is a 88 y.o. female.   Hypertension Associated symptoms include headaches and shortness of breath.  Patient presents for hypertension.  Medical history includes T2DM, CKD, HLD, HTN, GERD, DVT.  She has had multiple hospitalizations over the past several months.  She underwent lithotripsy and ureteral stent placement.  She has since had the stent removed.  She has been on multiple rounds of antibiotics for treatment of UTI.  Most recently, she finished her antibiotic course 3 days ago.  She has also been in the hospital for dehydration and bronchitis.  Patient currently lives alone in an independent living facility.  She is ambulatory at baseline with a walker.  Over the past several days, she has had decreased activity.  When her daughter spoke to her on the phone yesterday, she sounded short of breath with conversation.  Her shortness of breath has continued today.  In addition to her shortness of breath, she endorses lightheadedness, generalized weakness, nausea, and headache.  She checks her blood pressure at home and found it to be elevated in the range of 180 SBP over the past 2 days.  She has been adherent to her home medications which include amlodipine, bisoprolol, and Lasix.  She takes these medications in the morning and her last doses were this morning.     Home Medications Prior to Admission medications   Medication Sig Start Date End Date Taking? Authorizing Provider  albuterol (VENTOLIN HFA) 108 (90 Base) MCG/ACT inhaler Inhale 2 puffs into the lungs every 6 (six) hours as needed for wheezing or shortness of breath. Patient not taking: Reported on 02/11/2022 09/09/21   Oswald Hillock, MD  Alcohol Swabs (B-D SINGLE USE SWABS REGULAR) PADS USE AS DIRECTED EVERY DAY 09/11/20    [name], Rayford Halsted, MD  amitriptyline (ELAVIL) 25 MG tablet Take 1 tablet every third night Patient taking differently: Take 25 mg by mouth See admin instructions. Take 25mg  by mouth every third night 08/26/21   [name], Rayford Halsted, MD  amLODipine (NORVASC) 10 MG tablet Take 0.5 tablets (5 mg total) by mouth daily. 02/11/22   [name], Rayford Halsted, MD  aspirin EC 81 MG tablet Take 81 mg by mouth in the morning.    [provider]  atorvastatin (LIPITOR) 20 MG tablet Take 1 tablet (20 mg total) by mouth at bedtime. 02/24/22   [name], Rayford Halsted, MD  bisoprolol (ZEBETA) 5 MG tablet TAKE 1 TABLET EVERY DAY 12/30/21   [name], Rayford Halsted, MD  Blood Glucose Monitoring Suppl (TRUE METRIX METER) DEVI 1 each by Does not apply route daily. 09/28/20   [name], Rayford Halsted, MD  budesonide (PULMICORT) 0.25 MG/2ML nebulizer solution Take 2 mLs (0.25 mg total) by nebulization 2 (two) times daily. Patient not taking: Reported on 02/11/2022 09/09/21   Oswald Hillock, MD  Cholecalciferol (VITAMIN D3) 50 MCG (2000 UT) TABS Take 2,000 Units by mouth in the morning.    [provider]  docusate sodium (COLACE) 100 MG capsule Take 1 capsule (100 mg total) by mouth 2 (two) times daily. 12/30/18   [name], Rayford Halsted, MD  furosemide (LASIX) 20 MG tablet Take on M-W-F 02/11/22   [name], Rayford Halsted, MD  glucose blood (TRUE METRIX BLOOD GLUCOSE TEST) test  strip 1 each by Other route daily. Dx E11.9 10/03/20   [name], Rayford Halsted, MD  ipratropium (ATROVENT) 0.03 % nasal spray Place 1 spray into both nostrils 3 (three) times daily as needed (congestion/allergies.). 01/22/21   [provider]  ipratropium-albuterol (DUONEB) 0.5-2.5 (3) MG/3ML SOLN Take 3 mLs by nebulization every 6 (six) hours as needed. Patient not taking: Reported on 02/11/2022 09/09/21   Oswald Hillock, MD  ipratropium-albuterol (DUONEB) 0.5-2.5 (3) MG/3ML SOLN Use 1ml's via nebulizer  every 6 hours as needed Patient not taking: Reported on 02/11/2022 09/09/21   Oswald Hillock, MD  Multiple Vitamin (MULTIVITAMIN WITH MINERALS) TABS tablet Take 1 tablet by mouth daily. Centrum Silver Patient not taking: Reported on 02/11/2022    [provider]  Multiple Vitamins-Minerals (PRESERVISION AREDS 2 PO) Take by mouth in the morning and at bedtime.    [provider]  Omega-3 Fatty Acids (FISH OIL) 1000 MG CAPS Take 1,000 mg by mouth 2 (two) times daily with a meal.    [provider]  Polyethyl Glycol-Propyl Glycol (SYSTANE ULTRA) 0.4-0.3 % SOLN Place 1-2 drops into both eyes 3 (three) times daily as needed (dry/irritated eyes.).    [provider]  polyethylene glycol powder (GLYCOLAX/MIRALAX) 17 GM/SCOOP powder Take 17 g by mouth 2 (two) times daily as needed. Patient taking differently: Take 17 g by mouth in the morning. 12/30/18   [name], Rayford Halsted, MD  Probiotic Product (PROBIOTIC PO) Take 1 capsule by mouth in the morning.    [provider]  TRUEplus Lancets 33G MISC TEST BLOOD SUGAR EVERY DAY 07/03/21   [name], Rayford Halsted, MD      Allergies    Coconut oil, Cocos nucifera, Penicillins, Bactrim [sulfamethoxazole-trimethoprim], and Other    Review of Systems   Review of Systems  Constitutional:  Positive for activity change and fatigue.  Respiratory:  Positive for shortness of breath.   Gastrointestinal:  Positive for nausea.  Neurological:  Positive for weakness (Generalized), light-headedness and headaches.  All other systems reviewed and are negative.   Physical Exam Updated Vital Signs BP (!) 178/65   Pulse (!) 58   Temp 98.1 F (36.7 C)   Resp 15   Ht $R'5\' 3"'Nd$  (1.6 m)   Wt 68.9 kg   SpO2 91%   BMI 26.93 kg/m  Physical Exam Vitals and nursing note reviewed.  Constitutional:      General: She is not in acute distress.    Appearance: Normal appearance. She is well-developed. She is not ill-appearing,  toxic-appearing or diaphoretic.  HENT:     Head: Normocephalic and atraumatic.     Right Ear: External ear normal.     Left Ear: External ear normal.     Nose: Nose normal.     Mouth/Throat:     Mouth: Mucous membranes are moist.     Pharynx: Oropharynx is clear.  Eyes:     Extraocular Movements: Extraocular movements intact.     Conjunctiva/sclera: Conjunctivae normal.  Cardiovascular:     Rate and Rhythm: Normal rate and regular rhythm.     Heart sounds: No murmur heard. Pulmonary:     Effort: Tachypnea and accessory muscle usage present.     Breath sounds: Rales present.  Abdominal:     General: There is no distension.     Palpations: Abdomen is soft.     Tenderness: There is no abdominal tenderness. There is no guarding.  Musculoskeletal:  General: No swelling, tenderness or deformity. Normal range of motion.     Cervical back: Normal range of motion and neck supple. No rigidity.  Skin:    General: Skin is warm and dry.     Capillary Refill: Capillary refill takes less than 2 seconds.  Neurological:     General: No focal deficit present.     Mental Status: She is alert and oriented to person, place, and time.     Cranial Nerves: No cranial nerve deficit.     Sensory: No sensory deficit.     Motor: No weakness.     Coordination: Coordination normal.  Psychiatric:        Mood and Affect: Mood normal.        Behavior: Behavior normal.        Thought Content: Thought content normal.        Judgment: Judgment normal.     ED Results / Procedures / Treatments   Labs (all labs ordered are listed, but only abnormal results are displayed) Labs Reviewed  CBC WITH DIFFERENTIAL/PLATELET - Abnormal; Notable for the following components:      Result Value   Hemoglobin 11.6 (*)    All other components within normal limits  COMPREHENSIVE METABOLIC PANEL - Abnormal; Notable for the following components:   Glucose, Bld 146 (*)    Creatinine, Ser 1.19 (*)    Total Protein  6.0 (*)    Albumin 3.0 (*)    GFR, Estimated 43 (*)    Anion gap 4 (*)    All other components within normal limits  URINALYSIS, ROUTINE W REFLEX MICROSCOPIC - Abnormal; Notable for the following components:   APPearance HAZY (*)    Protein, ur 100 (*)    Leukocytes,Ua SMALL (*)    Bacteria, UA RARE (*)    All other components within normal limits  BRAIN NATRIURETIC PEPTIDE - Abnormal; Notable for the following components:   B Natriuretic Peptide 304.8 (*)    All other components within normal limits  RESP PANEL BY RT-PCR (FLU A&B, COVID) ARPGX2  URINE CULTURE  MAGNESIUM  TROPONIN I (HIGH SENSITIVITY)  TROPONIN I (HIGH SENSITIVITY)    EKG EKG Interpretation  Date/Time:  Saturday March 08 2022 21:52:15 EDT Ventricular Rate:  56 PR Interval:  192 QRS Duration: 99 QT Interval:  431 QTC Calculation: 416 R Axis:   29 Text Interpretation: Sinus rhythm No significant change since prior tracings Confirmed by Godfrey Pick ([id number]) on 03/08/2022 9:54:50 PM  Radiology CT Angio Chest PE W and/or Wo Contrast  Result Date: 03/08/2022 CLINICAL DATA:  High probability for pulmonary embolism. EXAM: CT ANGIOGRAPHY CHEST WITH CONTRAST TECHNIQUE: Multidetector CT imaging of the chest was performed using the standard protocol during bolus administration of intravenous contrast. Multiplanar CT image reconstructions and MIPs were obtained to evaluate the vascular anatomy. RADIATION DOSE REDUCTION: This exam was performed according to the departmental dose-optimization program which includes automated exposure control, adjustment of the mA and/or kV according to patient size and/or use of iterative reconstruction technique. CONTRAST:  44mL OMNIPAQUE IOHEXOL 350 MG/ML SOLN COMPARISON:  Chest x-ray same day.  CT angiogram chest 09/05/2021. FINDINGS: Cardiovascular: Heart is mildly enlarged. Aorta is normal in size. There are atherosclerotic calcifications of the aorta. There is no pericardial effusion.  There is adequate opacification of the pulmonary arteries to the segmental level. There is no evidence for pulmonary embolism. Mediastinum/Nodes: No enlarged mediastinal, hilar, or axillary lymph nodes. Thyroid gland, trachea, and esophagus demonstrate no  significant findings. There is a small hiatal hernia. Lungs/Pleura: There are moderate-sized bilateral pleural effusions. 9 mm ground-glass nodular density in the right upper lobe appears unchanged from prior. There is atelectasis in the bilateral lower lobes and lingula, left greater than right. No pneumothorax. Trachea and central airways are patent. Upper Abdomen: No acute abnormality. Musculoskeletal: Multilevel degenerative changes affect the spine. Review of the MIP images confirms the above findings. IMPRESSION: 1. No evidence for pulmonary embolism. 2. Moderate-sized bilateral pleural effusions. 3. 9 mm ground-glass nodular density in the right upper lobe (stable for 6 months). Continued surveillance recommended with Chest CTs every 2 years until 5 years. If nodule grows or develops solid component(s), consider resection. These guidelines do not apply to immunocompromised patients and patients with cancer. Follow up in patients with significant comorbidities as clinically warranted. For lung cancer screening, adhere to Lung-RADS guidelines. Reference: Radiology. 2017; 284(1):228-43. 4. Mild cardiomegaly. Aortic Atherosclerosis (ICD10-I70.0). Electronically Signed   By: Ronney Asters M.D.   On: 03/08/2022 22:48   CT Head Wo Contrast  Result Date: 03/08/2022 CLINICAL DATA:  Hypertension, headaches, neurological deficit EXAM: CT HEAD WITHOUT CONTRAST TECHNIQUE: Contiguous axial images were obtained from the base of the skull through the vertex without intravenous contrast. RADIATION DOSE REDUCTION: This exam was performed according to the departmental dose-optimization program which includes automated exposure control, adjustment of the mA and/or kV  according to patient size and/or use of iterative reconstruction technique. COMPARISON:  01/24/2019 FINDINGS: Brain: No acute intracranial findings are seen. There are no signs of bleeding within the cranium. Cortical sulci are prominent. Few old lacunar infarcts are seen in basal ganglia. There is decreased density in periventricular white matter. Vascular: Unremarkable. Skull: No acute findings are seen. Sinuses/Orbits: Possible small osteoma is seen in left ethmoid sinus. Other: None. IMPRESSION: No acute intracranial findings are seen in noncontrast CT brain. Atrophy. Old lacunar infarct is seen in basal ganglia. Small-vessel disease. Electronically Signed   By: Elmer Picker M.D.   On: 03/08/2022 16:26   DG Chest 2 View  Result Date: 03/08/2022 CLINICAL DATA:  Shortness of breath EXAM: CHEST - 2 VIEW COMPARISON:  09/04/2021 FINDINGS: Cardiac size is within normal limits. There is interval appearance of small to moderate bilateral pleural effusions, more so on the left side. Pleural effusions limit evaluation of lower lung fields for infiltrates. There are no signs of alveolar pulmonary edema. There is no pneumothorax. Increase in AP diameter of chest suggests COPD. Osteopenia is seen in bony structures. IMPRESSION: There is interval appearance of small to moderate bilateral pleural effusions, more so on the left side. There are no signs of alveolar pulmonary edema. Evaluation of lower lung fields for infiltrates is limited by the effusions. Rest of the lung fields appear clear. Electronically Signed   By: Elmer Picker M.D.   On: 03/08/2022 16:23    Procedures Procedures    Medications Ordered in ED Medications  nitroGLYCERIN 50 mg in dextrose 5 % 250 mL (0.2 mg/mL) infusion (20 mcg/min Intravenous Rate/Dose Change 03/08/22 2216)  cefTRIAXone (ROCEPHIN) 1 g in sodium chloride 0.9 % 100 mL IVPB (has no administration in time range)  furosemide (LASIX) injection 20 mg (has no  administration in time range)  potassium chloride SA (KLOR-CON M) CR tablet 40 mEq (has no administration in time range)  iohexol (OMNIPAQUE) 350 MG/ML injection 75 mL (75 mLs Intravenous Contrast Given 03/08/22 2221)    ED Course/ Medical Decision Making/ A&P  Medical Decision Making Amount and/or Complexity of Data Reviewed Labs: ordered. Radiology: ordered.  Risk Prescription drug management. Decision regarding hospitalization.   This patient presents to the ED for concern of shortness of breath, this involves an extensive number of treatment options, and is a complaint that carries with it a high risk of complications and morbidity.  The differential diagnosis includes pneumonia, PE, pulmonary edema, hypertensive emergency, valvular dysfunction   Co morbidities that complicate the patient evaluation  T2DM, CKD, HLD, HTN, GERD, DVT   Additional history obtained:  Additional history obtained from patient's daughter External records from outside source obtained and reviewed including EMR   Lab Tests:  I Ordered, and personally interpreted labs.  The pertinent results include: Creatinine is improved from prior hospitalization.  Potassium is low-normal.  Electrolytes are otherwise normal.  Her hemoglobin is increased from baseline.  No leukocytosis is present.  Urinalysis shows pyuria and bacteria consistent with UTI.   Imaging Studies ordered:  I ordered imaging studies including chest x-ray, CT head, CTA chest I independently visualized and interpreted imaging which showed no acute findings on CT head.  Chest imaging showed moderate-sized bilateral pleural effusions I agree with the radiologist interpretation   Cardiac Monitoring: / EKG:  The patient was maintained on a cardiac monitor.  I personally viewed and interpreted the cardiac monitored which showed an underlying rhythm of: Sinus rhythm   Problem List / ED Course / Critical  interventions / Medication management  Patient is a 88 year old female who presents from home, with her daughter, for increased shortness of breath.  Additional recent symptoms include generalized weakness, fatigue, nausea, lightheadedness, and headache.  She has been checking her blood pressures at home and they have been elevated over the past 2 days.  On arrival in the ED, pressures elevated in the range of 220s over 60.  Prior to being bedded in the ED, diagnostic work-up was initiated.  Lab work shows improved hemoglobin from baseline, no leukocytosis, creatinine that is improved from prior hospitalization, and normal electrolytes.  CT of head showed no acute findings.  Chest x-ray did, however, show bilateral pleural effusions which are new.  EKG shows no significant changes from prior tracings.  Initial troponin is normal.  On assessment, patient is tachypneic with increased work of breathing.  She has difficult time completing sentences.  No wheezing is appreciated on lung auscultation.  She does have bibasilar crackles.  Blood pressure at this point remains in the range of 220s over 60.  Given her increased work of breathing, BiPAP was initiated.  Patient to be treated for hypertensive emergency with NTG gtt.  On reassessment, patient is tolerating BiPAP well.  Blood pressure seemed to improve with only low-dose NTG.  She was taken off of BiPAP and did have improved work of breathing.  Blood pressure subsequently was elevated again and NTG infusion was increased to 20 mcg/min.  Patient underwent CTA of chest for further evaluation of her new bilateral pleural effusions and shortness of breath.  No PE was identified.  Although there was no clear evidence of pneumonia, patient is experiencing chills.  This could be secondary to a pulmonary source or UTI.  She does have pyuria and bacteriuria present on UA.  Patient was started on ceftriaxone for empiric treatment of both.  Urine cultures were sent.  Patient  was admitted to hospitalist for further management. I ordered medication including potassium chloride for optimization of potassium; Lasix for diuresis; NTG for hypertension; ceftriaxone for empiric  treatment of pneumonia and/for UTI. Reevaluation of the patient after these medicines showed that the patient improved I have reviewed the patients home medicines and have made adjustments as needed   Social Determinants of Health:  Lives independently         Final Clinical Impression(s) / ED Diagnoses Final diagnoses:  Hypertension, unspecified type  Chills  Shortness of breath    Rx / DC Orders ED Discharge Orders     None         Godfrey Pick, MD 03/08/22 2337

## 2022-03-08 NOTE — Hospital Course (Signed)
Stacey Armstrong is a 88 y.o. female with medical history significant for COPD, CKD stage IV, history of CVA, T2DM, HTN, HLD who is admitted with acute respiratory failure due to bilateral pleural effusions and hypertensive urgency.

## 2022-03-08 NOTE — Progress Notes (Signed)
RT went by to check on pt while on BIPAP, per RN the MD removed pt from BIPAP.  Pt currently on room air and tolerating well.  BIPAP on stand-by in pt room.

## 2022-03-08 NOTE — H&P (Signed)
Initial vitals showed History and Physical    Stacey Armstrong PPI:[id number] DOB: April 06, 1928 DOA: 03/08/2022  PCP: [name], Rayford Halsted, MD  Patient coming from: Home  I have personally briefly reviewed patient's old medical records in [place]  Chief Complaint: Shortness of breath  HPI: Stacey Armstrong is a 88 y.o. female with medical history significant for COPD, CKD stage IV, history of CVA, T2DM, HTN, HLD, hard of hearing who presented to the ED for evaluation of shortness of breath.  Patient states about 3 weeks ago she began to have swelling in her lower extremities.  She was started on Lasix 20 mg 3 times per week by her PCP.  She says that she has been having good urine output with the Lasix.  However over the last week she has been having progressively worsening dyspnea on exertion, orthopnea, and persistent lower extremity swelling.  She reports having some shaking chills without diaphoresis or subjective fevers.  She reports occasional cough.  She denies any chest pain, nausea, vomiting, abdominal pain.  ED Course  Labs/Imaging on admission: I have personally reviewed following labs and imaging studies.  Initial vitals showed BP 229/63, pulse 67, RR 25, temp 98.1 F, SPO2 96% room air.  Labs show WBC 7.0, hemoglobin 11.6, platelets 292,000, sodium 139, potassium 3.5, bicarb 29, BUN 18, creatinine 1.19, serum glucose 146, LFTs within normal limits, BNP 304.8, troponin 17 > 16.  SARS-CoV-2 and influenza PCR negative.  Urinalysis shows negative nitrites, small leukocytes, 0-5 RBC/hpf, 11-20 WBC/hpf, rare bacteria microscopy.  Urine culture in process.  2 view chest x-ray shows interval appearance of small-moderate bilateral pleural effusions.  CT head without contrast negative for acute findings.  Old lacunar infarct seen in the basal ganglia.  Small vessel disease noted.  CTA chest was negative for evidence of PE.  Moderate sized bilateral pleural effusions noted.  9  mm groundglass nodular density in the RUL noted.  Patient was placed on nitro drip, given IV Lasix 20 mg, IV ceftriaxone 1 g.  Patient briefly placed on BiPAP and since transitioned to room air.  The hospitalist service was consulted to admit for further evaluation and management.  Review of Systems: All systems reviewed and are negative except as documented in history of present illness above.   Past Medical History:  Diagnosis Date   Arthritis    Complication of anesthesia    after gallbladder surgery with extubation had shallow respirations,  needed to continue intudation for additonal 10-12 days   Diabetes ([place])    DVT (deep venous thrombosis) (HCC)    left leg after hysterectomy   GERD (gastroesophageal reflux disease)    History of COVID-19 05/2021   History of kidney stones    History of transient ischemic attack (TIA)    HOH (hard of hearing)    Hyperlipidemia    Hypertension    Sleep apnea     Past Surgical History:  Procedure Laterality Date   ABDOMINAL HYSTERECTOMY     APPENDECTOMY     CATARACT EXTRACTION, BILATERAL     CHOLECYSTECTOMY     COLONOSCOPY     CYSTOSCOPY WITH STENT PLACEMENT Right 08/14/2021   Procedure: CYSTOSCOPY WITH STENT PLACEMENT;  Surgeon: Ardis Hughs, MD;  Location: [facility];  Service: Urology;  Laterality: Right;  ONLY NEEDS 30 MIN   EXTRACORPOREAL SHOCK WAVE LITHOTRIPSY Right 02/06/2022   Procedure: RIGHT EXTRACORPOREAL SHOCK WAVE LITHOTRIPSY (ESWL);  Surgeon: Ardis Hughs, MD;  Location: [facility];  Service: Urology;  Laterality: Right;    Social History:  reports that she has quit smoking. She has never used smokeless tobacco. She reports that she does not currently use alcohol. She reports that she does not use drugs.  Allergies  Allergen Reactions   Coconut Oil    Cocos Nucifera Hives   Penicillins Hives   Bactrim [Sulfamethoxazole-Trimethoprim] Nausea And Vomiting   Other Hives    Berries     Family  History  Problem Relation Age of Onset   CAD Maternal Grandmother    Pneumonia Maternal Grandfather      Prior to Admission medications   Medication Sig Start Date End Date Taking? Authorizing Provider  albuterol (VENTOLIN HFA) 108 (90 Base) MCG/ACT inhaler Inhale 2 puffs into the lungs every 6 (six) hours as needed for wheezing or shortness of breath. Patient not taking: Reported on 02/11/2022 09/09/21   Oswald Hillock, MD  Alcohol Swabs (B-D SINGLE USE SWABS REGULAR) PADS USE AS DIRECTED EVERY DAY 09/11/20   [name], Rayford Halsted, MD  amitriptyline (ELAVIL) 25 MG tablet Take 1 tablet every third night Patient taking differently: Take 25 mg by mouth See admin instructions. Take 25mg  by mouth every third night 08/26/21   [name], Rayford Halsted, MD  amLODipine (NORVASC) 10 MG tablet Take 0.5 tablets (5 mg total) by mouth daily. 02/11/22   [name], Rayford Halsted, MD  aspirin EC 81 MG tablet Take 81 mg by mouth in the morning.    [provider]  atorvastatin (LIPITOR) 20 MG tablet Take 1 tablet (20 mg total) by mouth at bedtime. 02/24/22   [name], Rayford Halsted, MD  bisoprolol (ZEBETA) 5 MG tablet TAKE 1 TABLET EVERY DAY 12/30/21   [name], Rayford Halsted, MD  Blood Glucose Monitoring Suppl (TRUE METRIX METER) DEVI 1 each by Does not apply route daily. 09/28/20   [name], Rayford Halsted, MD  budesonide (PULMICORT) 0.25 MG/2ML nebulizer solution Take 2 mLs (0.25 mg total) by nebulization 2 (two) times daily. Patient not taking: Reported on 02/11/2022 09/09/21   Oswald Hillock, MD  Cholecalciferol (VITAMIN D3) 50 MCG (2000 UT) TABS Take 2,000 Units by mouth in the morning.    [provider]  docusate sodium (COLACE) 100 MG capsule Take 1 capsule (100 mg total) by mouth 2 (two) times daily. 12/30/18   [name], Rayford Halsted, MD  furosemide (LASIX) 20 MG tablet Take on M-W-F 02/11/22   [name], Rayford Halsted, MD  glucose blood (TRUE METRIX BLOOD GLUCOSE TEST) test  strip 1 each by Other route daily. Dx E11.9 10/03/20   [name], Rayford Halsted, MD  ipratropium (ATROVENT) 0.03 % nasal spray Place 1 spray into both nostrils 3 (three) times daily as needed (congestion/allergies.). 01/22/21   [provider]  ipratropium-albuterol (DUONEB) 0.5-2.5 (3) MG/3ML SOLN Take 3 mLs by nebulization every 6 (six) hours as needed. Patient not taking: Reported on 02/11/2022 09/09/21   Oswald Hillock, MD  ipratropium-albuterol (DUONEB) 0.5-2.5 (3) MG/3ML SOLN Use 33ml's via nebulizer every 6 hours as needed Patient not taking: Reported on 02/11/2022 09/09/21   Oswald Hillock, MD  Multiple Vitamin (MULTIVITAMIN WITH MINERALS) TABS tablet Take 1 tablet by mouth daily. Centrum Silver Patient not taking: Reported on 02/11/2022    [provider]  Multiple Vitamins-Minerals (PRESERVISION AREDS 2 PO) Take by mouth in the morning and at bedtime.    [provider]  Omega-3 Fatty Acids (FISH OIL) 1000 MG CAPS  Take 1,000 mg by mouth 2 (two) times daily with a meal.    [provider]  Polyethyl Glycol-Propyl Glycol (SYSTANE ULTRA) 0.4-0.3 % SOLN Place 1-2 drops into both eyes 3 (three) times daily as needed (dry/irritated eyes.).    [provider]  polyethylene glycol powder (GLYCOLAX/MIRALAX) 17 GM/SCOOP powder Take 17 g by mouth 2 (two) times daily as needed. Patient taking differently: Take 17 g by mouth in the morning. 12/30/18   [name], Rayford Halsted, MD  Probiotic Product (PROBIOTIC PO) Take 1 capsule by mouth in the morning.    [provider]  TRUEplus Lancets 33G MISC TEST BLOOD SUGAR EVERY DAY 07/03/21   [name], Rayford Halsted, MD    Physical Exam: Vitals:   03/08/22 2200 03/08/22 2215 03/08/22 2300 03/08/22 2315  BP: (!) 204/75 (!) 209/74 (!) 193/63 (!) 178/65  Pulse: 62 (!) 56 (!) 58 (!) 58  Resp: (!) $RemoveB'22 14 17 15  'eEeIUucS$ Temp:    98.2 F (36.8 C)  TempSrc:      SpO2: 93% 96% 93% 91%  Weight:      Height:        Constitutional: Elderly woman resting in bed with head elevated, NAD, calm, comfortable Eyes: EOMI, lids and conjunctivae normal ENMT: Hard of hearing, mucous membranes are moist. Posterior pharynx clear of any exudate or lesions.Normal dentition.  Neck: normal, supple, no masses. Respiratory: Bilateral expiratory crackles about halfway up the lung fields. Normal respiratory effort. No accessory muscle use.  Cardiovascular: Mild bradycardia with regular rhythm, no murmurs / rubs / gallops.  Trace tight pitting bilateral lower extremity edema. 2+ pedal pulses. Abdomen: no tenderness, no masses palpated.  Musculoskeletal: no clubbing / cyanosis. No joint deformity upper and lower extremities. Good ROM, no contractures. Normal muscle tone.  Skin: no rashes, lesions, ulcers. No induration Neurologic: Sensation intact. Strength 5/5 in all 4.  Psychiatric: Alert and oriented x 3. Normal mood.   EKG: Personally reviewed. Sinus rhythm, rate 56, no acute ischemic changes.  Similar to prior.  Assessment/Plan Principal Problem:   Acute respiratory failure (HCC) Active Problems:   Bilateral pleural effusion   Hypertensive urgency   CKD (chronic kidney disease) stage 4, GFR 15-29 ml/min (HCC)   Type 2 diabetes mellitus with hyperlipidemia (HCC)   Hyperlipidemia   History of CVA (cerebrovascular accident)   Stacey Armstrong is a 88 y.o. female with medical history significant for COPD, CKD stage IV, history of CVA, T2DM, HTN, HLD who is admitted with acute respiratory failure due to bilateral pleural effusions and hypertensive urgency.  Assessment and Plan: * Acute respiratory failure ([place]) Moderate-large bilateral pleural effusions New bilateral pleural effusions which are causing her respiratory symptoms.  She however has not been hypoxic.  Had some improvement in dyspnea with temporary BiPAP use.  She states that she has been urinating frequently after receiving Lasix in the ED. -Continue IV  Lasix 20 mg BID -Monitor strict I/O's and daily weights -Obtain echocardiogram -Supplemental oxygen if needed -BiPAP prn  Hypertensive urgency SBP as high as 222.  Started on IV nitro drip in the ED with mild temporary improvement. -Resume home amlodipine 5 mg -Continue nitro drip and wean off as able -Hold bisoprolol due to borderline bradycardia -Continue IV Lasix as above  CKD (chronic kidney disease) stage 4, GFR 15-29 ml/min (HCC) Renal function actually improved from baseline with creatinine 1.19.  Continue to monitor.  History of CVA (cerebrovascular accident) Continue aspirin and statin.  Hyperlipidemia Continue atorvastatin.  Type 2 diabetes mellitus with hyperlipidemia (HCC) Place on sensitive SSI.  DVT prophylaxis: heparin injection 5,000 Units Start: 03/09/22 0600 Code Status: Full code, confirmed with patient on admission Family Communication: Daughter at bedside Disposition Plan: From home, dispo pending clinical progress Consults called: None Severity of Illness: The appropriate patient status for this patient is INPATIENT. Inpatient status is judged to be reasonable and necessary in order to provide the required intensity of service to ensure the patient's safety. The patient's presenting symptoms, physical exam findings, and initial radiographic and laboratory data in the context of their chronic comorbidities is felt to place them at high risk for further clinical deterioration. Furthermore, it is not anticipated that the patient will be medically stable for discharge from the hospital within 2 midnights of admission.   * I certify that at the point of admission it is my clinical judgment that the patient will require inpatient hospital care spanning beyond 2 midnights from the point of admission due to high intensity of service, high risk for further deterioration and high frequency of surveillance required.Zada Finders MD [facility]  If 7PM-7AM,  please contact night-coverage [email or web address]  03/09/2022, 12:32 AM

## 2022-03-08 NOTE — ED Triage Notes (Signed)
Patient said her blood pressure has been high the last 2 days. 198/70. Has had a headache. Weakness all over her body. Just finished antibiotics Wednesday for a UTI.

## 2022-03-08 NOTE — ED Provider Triage Note (Signed)
Emergency Medicine Provider Triage Evaluation Note  Stacey Armstrong , a 88 y.o. female  was evaluated in triage.  Pt complains of headaches, lightheadedness, weakness, nausea and some mild shortness of breath.  Also notes her blood pressure has been very elevated over the past 2 days.  Headaches have been worse over this time.  Review of Systems  Positive: As above Negative: Chest pain, vomiting or diarrhea  Physical Exam  BP (!) 222/54 (BP Location: Left Arm)   Pulse (!) 59   Temp 97.8 F (36.6 C) (Oral)   Resp 18   Ht $R'5\' 3"'LM$  (1.6 m)   Wt 68.9 kg   SpO2 97%   BMI 26.93 kg/m  Gen:   Awake, no distress   Resp:  Normal effort  MSK:   Moves extremities without difficulty  Other:  Lung sounds clear, regular rate and rhythm.  Resting comfortably, alert and oriented and able to provide her own history.  Medical Decision Making  Medically screening exam initiated at 4:01 PM.  Appropriate orders placed.  Stacey Armstrong was informed that the remainder of the evaluation will be completed by another provider, this initial triage assessment does not replace that evaluation, and the importance of remaining in the ED until their evaluation is complete.      Rhae Hammock, PA-C 03/08/22 [phone]

## 2022-03-09 ENCOUNTER — Other Ambulatory Visit (HOSPITAL_COMMUNITY): Payer: Medicare HMO

## 2022-03-09 DIAGNOSIS — Z8673 Personal history of transient ischemic attack (TIA), and cerebral infarction without residual deficits: Secondary | ICD-10-CM

## 2022-03-09 DIAGNOSIS — J90 Pleural effusion, not elsewhere classified: Secondary | ICD-10-CM | POA: Diagnosis not present

## 2022-03-09 DIAGNOSIS — N184 Chronic kidney disease, stage 4 (severe): Secondary | ICD-10-CM | POA: Diagnosis not present

## 2022-03-09 DIAGNOSIS — E1169 Type 2 diabetes mellitus with other specified complication: Secondary | ICD-10-CM

## 2022-03-09 DIAGNOSIS — I160 Hypertensive urgency: Secondary | ICD-10-CM

## 2022-03-09 DIAGNOSIS — E785 Hyperlipidemia, unspecified: Secondary | ICD-10-CM

## 2022-03-09 DIAGNOSIS — J9601 Acute respiratory failure with hypoxia: Secondary | ICD-10-CM | POA: Diagnosis not present

## 2022-03-09 DIAGNOSIS — J9600 Acute respiratory failure, unspecified whether with hypoxia or hypercapnia: Secondary | ICD-10-CM | POA: Diagnosis not present

## 2022-03-09 LAB — CBG MONITORING, ED: Glucose-Capillary: 155 mg/dL — ABNORMAL HIGH (ref 70–99)

## 2022-03-09 LAB — GLUCOSE, CAPILLARY
Glucose-Capillary: 103 mg/dL — ABNORMAL HIGH (ref 70–99)
Glucose-Capillary: 116 mg/dL — ABNORMAL HIGH (ref 70–99)
Glucose-Capillary: 188 mg/dL — ABNORMAL HIGH (ref 70–99)

## 2022-03-09 LAB — BASIC METABOLIC PANEL
Anion gap: 7 (ref 5–15)
BUN: 17 mg/dL (ref 8–23)
CO2: 28 mmol/L (ref 22–32)
Calcium: 8.7 mg/dL — ABNORMAL LOW (ref 8.9–10.3)
Chloride: 104 mmol/L (ref 98–111)
Creatinine, Ser: 1.32 mg/dL — ABNORMAL HIGH (ref 0.44–1.00)
GFR, Estimated: 38 mL/min — ABNORMAL LOW (ref >60)
Glucose, Bld: 160 mg/dL — ABNORMAL HIGH (ref 70–99)
Potassium: 4.3 mmol/L (ref 3.5–5.1)
Sodium: 139 mmol/L (ref 135–145)

## 2022-03-09 LAB — MAGNESIUM: Magnesium: 2.2 mg/dL (ref 1.7–2.4)

## 2022-03-09 LAB — CBC
HCT: 34.5 % — ABNORMAL LOW (ref 36.0–46.0)
Hemoglobin: 11 g/dL — ABNORMAL LOW (ref 12.0–15.0)
MCH: 27.6 pg (ref 26.0–34.0)
MCHC: 31.9 g/dL (ref 30.0–36.0)
MCV: 86.5 fL (ref 80.0–100.0)
Platelets: 294 10*3/uL (ref 150–400)
RBC: 3.99 MIL/uL (ref 3.87–5.11)
RDW: 15.1 % (ref 11.5–15.5)
WBC: 9.5 10*3/uL (ref 4.0–10.5)
nRBC: 0 % (ref 0.0–0.2)

## 2022-03-09 MED ORDER — ACETAMINOPHEN 650 MG RE SUPP: 650.0000 mg | Freq: Four times a day (QID) | RECTAL | Status: DC | PRN

## 2022-03-09 MED ORDER — ALBUTEROL SULFATE HFA 108 (90 BASE) MCG/ACT IN AERS: 2.0000 | INHALATION_SPRAY | Freq: Four times a day (QID) | RESPIRATORY_TRACT | Status: DC | PRN

## 2022-03-09 MED ORDER — INSULIN ASPART 100 UNIT/ML IJ SOLN
0.0000 [IU] | Freq: Three times a day (TID) | INTRAMUSCULAR | Status: DC
  Administered 2022-03-10: 1 [IU] via SUBCUTANEOUS

## 2022-03-09 MED ORDER — AMLODIPINE BESYLATE 10 MG PO TABS
10.0000 mg | ORAL_TABLET | Freq: Every day | ORAL | Status: DC
  Administered 2022-03-09 – 2022-03-12 (×4): 10 mg via ORAL
  Filled 2022-03-09: qty 2
  Filled 2022-03-09 (×3): qty 1

## 2022-03-09 MED ORDER — INSULIN ASPART 100 UNIT/ML IJ SOLN: 0.0000 [IU] | Freq: Three times a day (TID) | INTRAMUSCULAR | Status: DC

## 2022-03-09 MED ORDER — SODIUM CHLORIDE 0.9% FLUSH
3.0000 mL | Freq: Two times a day (BID) | INTRAVENOUS | Status: DC
  Administered 2022-03-09 – 2022-03-12 (×7): 3 mL via INTRAVENOUS

## 2022-03-09 MED ORDER — FUROSEMIDE 10 MG/ML IJ SOLN
20.0000 mg | Freq: Two times a day (BID) | INTRAMUSCULAR | Status: DC
  Administered 2022-03-09 – 2022-03-12 (×7): 20 mg via INTRAVENOUS
  Filled 2022-03-09 (×3): qty 2
  Filled 2022-03-09: qty 4
  Filled 2022-03-09 (×3): qty 2

## 2022-03-09 MED ORDER — ACETAMINOPHEN 325 MG PO TABS
650.0000 mg | ORAL_TABLET | Freq: Four times a day (QID) | ORAL | Status: DC | PRN
  Administered 2022-03-09: 650 mg via ORAL
  Filled 2022-03-09 (×2): qty 2

## 2022-03-09 MED ORDER — ASPIRIN 81 MG PO TBEC
81.0000 mg | DELAYED_RELEASE_TABLET | Freq: Every morning | ORAL | Status: DC
  Administered 2022-03-09 – 2022-03-12 (×4): 81 mg via ORAL
  Filled 2022-03-09 (×4): qty 1

## 2022-03-09 MED ORDER — ONDANSETRON HCL 4 MG/2ML IJ SOLN
4.0000 mg | Freq: Four times a day (QID) | INTRAMUSCULAR | Status: DC | PRN
  Administered 2022-03-09: 4 mg via INTRAVENOUS
  Filled 2022-03-09 (×2): qty 2

## 2022-03-09 MED ORDER — INSULIN ASPART 100 UNIT/ML IJ SOLN
0.0000 [IU] | Freq: Three times a day (TID) | INTRAMUSCULAR | Status: DC
  Administered 2022-03-09: 2 [IU] via SUBCUTANEOUS
  Filled 2022-03-09: qty 0.09

## 2022-03-09 MED ORDER — INSULIN ASPART 100 UNIT/ML IJ SOLN: 0.0000 [IU] | Freq: Every day | INTRAMUSCULAR | Status: DC

## 2022-03-09 MED ORDER — ATORVASTATIN CALCIUM 20 MG PO TABS
20.0000 mg | ORAL_TABLET | Freq: Every day | ORAL | Status: DC
  Administered 2022-03-09 – 2022-03-11 (×3): 20 mg via ORAL
  Filled 2022-03-09 (×3): qty 1

## 2022-03-09 MED ORDER — POTASSIUM CHLORIDE CRYS ER 20 MEQ PO TBCR
20.0000 meq | EXTENDED_RELEASE_TABLET | Freq: Every day | ORAL | Status: DC
  Administered 2022-03-09 – 2022-03-12 (×4): 20 meq via ORAL
  Filled 2022-03-09 (×4): qty 1

## 2022-03-09 MED ORDER — SENNOSIDES-DOCUSATE SODIUM 8.6-50 MG PO TABS
1.0000 | ORAL_TABLET | Freq: Two times a day (BID) | ORAL | Status: DC
  Administered 2022-03-09 – 2022-03-12 (×5): 1 via ORAL
  Filled 2022-03-09 (×6): qty 1

## 2022-03-09 MED ORDER — HEPARIN SODIUM (PORCINE) 5000 UNIT/ML IJ SOLN
5000.0000 [IU] | Freq: Three times a day (TID) | INTRAMUSCULAR | Status: DC
  Administered 2022-03-09 – 2022-03-12 (×10): 5000 [IU] via SUBCUTANEOUS
  Filled 2022-03-09 (×10): qty 1

## 2022-03-09 MED ORDER — HYDRALAZINE HCL 25 MG PO TABS
25.0000 mg | ORAL_TABLET | Freq: Four times a day (QID) | ORAL | Status: DC | PRN
  Filled 2022-03-09: qty 1

## 2022-03-09 MED ORDER — SORBITOL 70 % SOLN
300.0000 mL | TOPICAL_OIL | Freq: Once | ORAL | Status: AC
  Administered 2022-03-09: 300 mL via RECTAL
  Filled 2022-03-09: qty 90

## 2022-03-09 MED ORDER — OXYCODONE HCL 5 MG PO TABS
5.0000 mg | ORAL_TABLET | Freq: Four times a day (QID) | ORAL | Status: DC | PRN
  Administered 2022-03-09 (×2): 5 mg via ORAL
  Filled 2022-03-09 (×2): qty 1

## 2022-03-09 MED ORDER — SENNOSIDES-DOCUSATE SODIUM 8.6-50 MG PO TABS: 1.0000 | ORAL_TABLET | Freq: Every evening | ORAL | Status: DC | PRN

## 2022-03-09 MED ORDER — ONDANSETRON HCL 4 MG PO TABS: 4.0000 mg | ORAL_TABLET | Freq: Four times a day (QID) | ORAL | Status: DC | PRN

## 2022-03-09 MED ORDER — POLYETHYLENE GLYCOL 3350 17 G PO PACK: 17.0000 g | PACK | Freq: Two times a day (BID) | ORAL | Status: DC | PRN

## 2022-03-09 MED ORDER — ALBUTEROL SULFATE (2.5 MG/3ML) 0.083% IN NEBU: 2.5000 mg | INHALATION_SOLUTION | Freq: Four times a day (QID) | RESPIRATORY_TRACT | Status: DC | PRN

## 2022-03-09 MED ORDER — AMLODIPINE BESYLATE 5 MG PO TABS: 5.0000 mg | ORAL_TABLET | Freq: Every day | ORAL | Status: DC

## 2022-03-09 NOTE — Assessment & Plan Note (Signed)
Renal function actually improved from baseline with creatinine 1.19.  Continue to monitor.

## 2022-03-09 NOTE — Assessment & Plan Note (Signed)
Atorvastatin

## 2022-03-09 NOTE — Progress Notes (Signed)
Bipap currently on standby

## 2022-03-09 NOTE — Assessment & Plan Note (Addendum)
   Managed with sensitive sliding scale

## 2022-03-09 NOTE — Assessment & Plan Note (Signed)
   Aspirin and statin were continued

## 2022-03-09 NOTE — Progress Notes (Signed)
PROGRESS NOTE    Stacey Armstrong  SJG:[id number] DOB: May 04, 1928 DOA: 03/08/2022 PCP: [name], Rayford Halsted, MD    Brief Narrative:   Stacey Armstrong is a 88 y.o. female with past medical history significant for COPD, CKD stage IV, history of CVA, T2DM, HTN, HLD, hard of hearing who presented to the ED for evaluation of shortness of breath. Patient states about 3 weeks ago she began to have swelling in her lower extremities.  She was started on Lasix 20 mg 3 times per week by her PCP.  She says that she has been having good urine output with the Lasix.  However over the last week she has been having progressively worsening dyspnea on exertion, orthopnea, and persistent lower extremity swelling.  She reports having some shaking chills without diaphoresis or subjective fevers.  She reports occasional cough.  She denies any chest pain, nausea, vomiting, abdominal pain.   In the ED, BP 229/63, pulse 67, RR 25, temp 98.1 F, SPO2 96% room air. WBC 7.0, hemoglobin 11.6, platelets 292,000, sodium 139, potassium 3.5, bicarb 29, BUN 18, creatinine 1.19, serum glucose 146, LFTs within normal limits, BNP 304.8, troponin 17 > 16. SARS-CoV-2 and influenza PCR negative.  Urinalysis shows negative nitrites, small leukocytes, 0-5 RBC/hpf, 11-20 WBC/hpf, rare bacteria microscopy.  Urine culture in process. 2 view chest x-ray shows interval appearance of small-moderate bilateral pleural effusions. CT head without contrast negative for acute findings.  Old lacunar infarct seen in the basal ganglia.  Small vessel disease noted. CTA chest was negative for evidence of PE.  Moderate sized bilateral pleural effusions noted.  9 mm groundglass nodular density in the RUL noted. Patient was placed on nitro drip, given IV Lasix 20 mg, IV ceftriaxone 1 g.  Patient briefly placed on BiPAP and since transitioned to room air.  The hospitalist service was consulted to admit for further evaluation and management.    Assessment &  Plan:   Acute respiratory failure Moderate-large bilateral pleural effusions New bilateral pleural effusions which are causing her respiratory symptoms.  She however has not been hypoxic.  Had some improvement in dyspnea with temporary BiPAP use.   --TTE: Pending --Continue IV Lasix 20 mg BID --Monitor strict I/O's and daily weights --Supplemental oxygen if needed --BiPAP prn   Hypertensive urgency SBP as high as 222.  Started on IV nitro drip in the ED with mild temporary improvement; which is now titrated off --Amlodipine increased to 10 mg PO daily --Lasix 20mg  IV q12h --Hydralazine 25mg  PO q6h PRN SBP >165 or DBP >110 --Holding bisoprolol due to borderline bradycardia --Continue aspirin and statin --Continue to monitor BP closely   CKD (chronic kidney disease) stage 4, GFR 15-29 ml/min (HCC) Baseline creatinine of 1.40-1.80. --Cr 1.19>1.32 --BMP daily   History of CVA (cerebrovascular accident) Continue aspirin and statin.   Hyperlipidemia Continue atorvastatin.   Type 2 diabetes mellitus with hyperlipidemia (HCC) Hemoglobin A1c 6.5 on 02/11/2022, well controlled  --sensitive SSI for coverage --CBGs qAC/HS.  Constipation --SMOG enema today --Senokot-S BID --Miralax BID PRN   DVT prophylaxis: heparin injection 5,000 Units Start: 03/09/22 0600    Code Status: Full Code Family Communication: Updated daughter present at bedside this morning  Disposition Plan:  Level of care: Telemetry Status is: Inpatient Remains inpatient appropriate because: IV diuresis, IR consultation for thoracentesis; needs improved BP control    Consultants:  IR  Procedures:  TTE: Pending  Antimicrobials:  Ceftriaxone 10/14 - 10/14   Subjective: Patient seen examined bedside, resting  comfortably.  Continues with mild shortness of breath and headache while on nitroglycerin drip.  Now off of BiPAP.  Daughter present at bedside.  Very hard of hearing.  Discussed pleural effusions,  fairly moderate on the left that may benefit from thoracentesis and okay to proceed with IR consultation.  Remains on IV diuresis.  No other specific questions or concerns at this time.  Denies dizziness, no chest pain, no palpitations, no fever/chills/night sweats, no nausea/vomiting/diarrhea, no abdominal pain, no focal weakness, no fatigue, no paresthesias.  No acute events overnight per nursing staff.  Objective: Vitals:   03/09/22 0822 03/09/22 1030 03/09/22 1130 03/09/22 1211  BP:  (!) 156/56 (!) 146/53 (!) 194/59  Pulse: 64 (!) 55 (!) 56 (!) 58  Resp: 20 (!) 21 20 20   Temp:   98.1 F (36.7 C) 98.7 F (37.1 C)  TempSrc:   Oral Oral  SpO2: 96% 96% 94% 98%  Weight:      Height:       No intake or output data in the 24 hours ending 03/09/22 1332 Filed Weights   03/08/22 1555  Weight: 68.9 kg    Examination:  Physical Exam: GEN: NAD, alert and oriented x 3, elderly/chronically ill appearance HEENT: NCAT, PERRL, EOMI, sclera clear, MMM PULM: Breath sounds diminished bilateral bases, greatest on left with mild crackles, no wheezing, normal respiratory effort without accessory muscle use, on 2 L nasal cannula with SPO2 98% at rest CV: Bradycardic, regular rhythm w/o M/G/R GI: abd soft, NTND, NABS, no R/G/M MSK: 1-2+ lower extremity peripheral edema, moves all extremity independently NEURO: CN II-XII intact, no focal deficits, sensation to light touch intact PSYCH: normal mood/affect Integumentary: dry/intact, no rashes or wounds    Data Reviewed: I have personally reviewed following labs and imaging studies  CBC: Recent Labs  Lab 03/08/22 1630 03/09/22 0620  WBC 7.0 9.5  NEUTROABS 4.6  --   HGB 11.6* 11.0*  HCT 37.1 34.5*  MCV 87.5 86.5  PLT 292 294   Basic Metabolic Panel: Recent Labs  Lab 03/08/22 1630 03/09/22 0620  NA 139 139  K 3.5 4.3  CL 106 104  CO2 29 28  GLUCOSE 146* 160*  BUN 18 17  CREATININE 1.19* 1.32*  CALCIUM 8.9 8.7*  MG  --  2.2    GFR: Estimated Creatinine Clearance: 25.3 mL/min (A) (by C-G formula based on SCr of 1.32 mg/dL (H)). Liver Function Tests: Recent Labs  Lab 03/08/22 1630  AST 22  ALT 23  ALKPHOS 114  BILITOT 0.7  PROT 6.0*  ALBUMIN 3.0*   No results for input(s): "LIPASE", "AMYLASE" in the last 168 hours. No results for input(s): "AMMONIA" in the last 168 hours. Coagulation Profile: No results for input(s): "INR", "PROTIME" in the last 168 hours. Cardiac Enzymes: No results for input(s): "CKTOTAL", "CKMB", "CKMBINDEX", "TROPONINI" in the last 168 hours. BNP (last 3 results) No results for input(s): "PROBNP" in the last 8760 hours. HbA1C: No results for input(s): "HGBA1C" in the last 72 hours. CBG: Recent Labs  Lab 03/09/22 0812 03/09/22 1252  GLUCAP 155* 103*   Lipid Profile: No results for input(s): "CHOL", "HDL", "LDLCALC", "TRIG", "CHOLHDL", "LDLDIRECT" in the last 72 hours. Thyroid Function Tests: No results for input(s): "TSH", "T4TOTAL", "FREET4", "T3FREE", "THYROIDAB" in the last 72 hours. Anemia Panel: No results for input(s): "VITAMINB12", "FOLATE", "FERRITIN", "TIBC", "IRON", "RETICCTPCT" in the last 72 hours. Sepsis Labs: No results for input(s): "PROCALCITON", "LATICACIDVEN" in the last 168 hours.  Recent Results (  from the past 240 hour(s))  Resp Panel by RT-PCR (Flu A&B, Covid) Anterior Nasal Swab     Status: None   Collection Time: 03/08/22  4:58 PM   Specimen: Anterior Nasal Swab  Result Value Ref Range Status   SARS Coronavirus 2 by RT PCR NEGATIVE NEGATIVE Final    Comment: (NOTE) SARS-CoV-2 target nucleic acids are NOT DETECTED.  The SARS-CoV-2 RNA is generally detectable in upper respiratory specimens during the acute phase of infection. The lowest concentration of SARS-CoV-2 viral copies this assay can detect is 138 copies/mL. A negative result does not preclude SARS-Cov-2 infection and should not be used as the sole basis for treatment or other patient  management decisions. A negative result may occur with  improper specimen collection/handling, submission of specimen other than nasopharyngeal swab, presence of viral mutation(s) within the areas targeted by this assay, and inadequate number of viral copies(<138 copies/mL). A negative result must be combined with clinical observations, patient history, and epidemiological information. The expected result is Negative.  Fact Sheet for Patients:  EntrepreneurPulse.com.au  Fact Sheet for Healthcare Providers:  IncredibleEmployment.be  This test is no t yet approved or cleared by the [place] FDA and  has been authorized for detection and/or diagnosis of SARS-CoV-2 by FDA under an Emergency Use Authorization (EUA). This EUA will remain  in effect (meaning this test can be used) for the duration of the COVID-19 declaration under Section 564(b)(1) of the Act, 21 U.S.C.section 360bbb-3(b)(1), unless the authorization is terminated  or revoked sooner.       Influenza A by PCR NEGATIVE NEGATIVE Final   Influenza B by PCR NEGATIVE NEGATIVE Final    Comment: (NOTE) The Xpert Xpress SARS-CoV-2/FLU/RSV plus assay is intended as an aid in the diagnosis of influenza from Nasopharyngeal swab specimens and should not be used as a sole basis for treatment. Nasal washings and aspirates are unacceptable for Xpert Xpress SARS-CoV-2/FLU/RSV testing.  Fact Sheet for Patients: EntrepreneurPulse.com.au  Fact Sheet for Healthcare Providers: IncredibleEmployment.be  This test is not yet approved or cleared by the [place] FDA and has been authorized for detection and/or diagnosis of SARS-CoV-2 by FDA under an Emergency Use Authorization (EUA). This EUA will remain in effect (meaning this test can be used) for the duration of the COVID-19 declaration under Section 564(b)(1) of the Act, 21 U.S.C. section 360bbb-3(b)(1),  unless the authorization is terminated or revoked.  Performed at [facility], [place] [address] [place], [place] [address]          Radiology Studies: CT Angio Chest PE W and/or Wo Contrast  Result Date: 03/08/2022 CLINICAL DATA:  High probability for pulmonary embolism. EXAM: CT ANGIOGRAPHY CHEST WITH CONTRAST TECHNIQUE: Multidetector CT imaging of the chest was performed using the standard protocol during bolus administration of intravenous contrast. Multiplanar CT image reconstructions and MIPs were obtained to evaluate the vascular anatomy. RADIATION DOSE REDUCTION: This exam was performed according to the departmental dose-optimization program which includes automated exposure control, adjustment of the mA and/or kV according to patient size and/or use of iterative reconstruction technique. CONTRAST:  4mL OMNIPAQUE IOHEXOL 350 MG/ML SOLN COMPARISON:  Chest x-ray same day.  CT angiogram chest 09/05/2021. FINDINGS: Cardiovascular: Heart is mildly enlarged. Aorta is normal in size. There are atherosclerotic calcifications of the aorta. There is no pericardial effusion. There is adequate opacification of the pulmonary arteries to the segmental level. There is no evidence for pulmonary embolism. Mediastinum/Nodes: No enlarged mediastinal, hilar, or axillary lymph nodes.  Thyroid gland, trachea, and esophagus demonstrate no significant findings. There is a small hiatal hernia. Lungs/Pleura: There are moderate-sized bilateral pleural effusions. 9 mm ground-glass nodular density in the right upper lobe appears unchanged from prior. There is atelectasis in the bilateral lower lobes and lingula, left greater than right. No pneumothorax. Trachea and central airways are patent. Upper Abdomen: No acute abnormality. Musculoskeletal: Multilevel degenerative changes affect the spine. Review of the MIP images confirms the above findings. IMPRESSION: 1. No evidence for pulmonary embolism. 2.  Moderate-sized bilateral pleural effusions. 3. 9 mm ground-glass nodular density in the right upper lobe (stable for 6 months). Continued surveillance recommended with Chest CTs every 2 years until 5 years. If nodule grows or develops solid component(s), consider resection. These guidelines do not apply to immunocompromised patients and patients with cancer. Follow up in patients with significant comorbidities as clinically warranted. For lung cancer screening, adhere to Lung-RADS guidelines. Reference: Radiology. 2017; 284(1):228-43. 4. Mild cardiomegaly. Aortic Atherosclerosis (ICD10-I70.0). Electronically Signed   By: Ronney Asters M.D.   On: 03/08/2022 22:48   CT Head Wo Contrast  Result Date: 03/08/2022 CLINICAL DATA:  Hypertension, headaches, neurological deficit EXAM: CT HEAD WITHOUT CONTRAST TECHNIQUE: Contiguous axial images were obtained from the base of the skull through the vertex without intravenous contrast. RADIATION DOSE REDUCTION: This exam was performed according to the departmental dose-optimization program which includes automated exposure control, adjustment of the mA and/or kV according to patient size and/or use of iterative reconstruction technique. COMPARISON:  01/24/2019 FINDINGS: Brain: No acute intracranial findings are seen. There are no signs of bleeding within the cranium. Cortical sulci are prominent. Few old lacunar infarcts are seen in basal ganglia. There is decreased density in periventricular white matter. Vascular: Unremarkable. Skull: No acute findings are seen. Sinuses/Orbits: Possible small osteoma is seen in left ethmoid sinus. Other: None. IMPRESSION: No acute intracranial findings are seen in noncontrast CT brain. Atrophy. Old lacunar infarct is seen in basal ganglia. Small-vessel disease. Electronically Signed   By: Elmer Picker M.D.   On: 03/08/2022 16:26   DG Chest 2 View  Result Date: 03/08/2022 CLINICAL DATA:  Shortness of breath EXAM: CHEST - 2 VIEW  COMPARISON:  09/04/2021 FINDINGS: Cardiac size is within normal limits. There is interval appearance of small to moderate bilateral pleural effusions, more so on the left side. Pleural effusions limit evaluation of lower lung fields for infiltrates. There are no signs of alveolar pulmonary edema. There is no pneumothorax. Increase in AP diameter of chest suggests COPD. Osteopenia is seen in bony structures. IMPRESSION: There is interval appearance of small to moderate bilateral pleural effusions, more so on the left side. There are no signs of alveolar pulmonary edema. Evaluation of lower lung fields for infiltrates is limited by the effusions. Rest of the lung fields appear clear. Electronically Signed   By: Elmer Picker M.D.   On: 03/08/2022 16:23        Scheduled Meds:  amLODipine  10 mg Oral Daily   aspirin EC  81 mg Oral q AM   atorvastatin  20 mg Oral QHS   furosemide  20 mg Intravenous BID   heparin  5,000 Units Subcutaneous Q8H   insulin aspart  0-5 Units Subcutaneous QHS   insulin aspart  0-9 Units Subcutaneous TID WC   potassium chloride  20 mEq Oral Daily   sodium chloride flush  3 mL Intravenous Q12H   sorbitol, milk of mag, mineral oil, glycerin (SMOG) enema  300 mL Rectal  Once   Continuous Infusions:   LOS: 1 day    Time spent: 52 minutes spent on chart review, discussion with nursing staff, consultants, updating family and interview/physical exam; more than 50% of that time was spent in counseling and/or coordination of care.    Eythan Jayne J [place] DO [facility] Available via Epic secure chat 7am-7pm After these hours, please refer to coverage provider listed on [email or web address] 03/09/2022, 1:32 PM

## 2022-03-09 NOTE — Assessment & Plan Note (Addendum)
   SBP as high as 222 on admission.    Started on IV nitro drip in the ED with mild temporary improvement with concurrent transient BiPAP therapy  Home regimen of Amlodipine was titrated upwards to $RemoveBe'10mg'[id number]$  Qdaily  Nitroglycerin drip was weaned off was able   Lasix titrated as above

## 2022-03-09 NOTE — Progress Notes (Signed)
Administered SMOG enema per MD order.  Patient had two large liquid brown stools.  Pt reported some nausea afterwards, zofran administered.  Sitting comfortably in chair.  Angie Fava, RN

## 2022-03-09 NOTE — Assessment & Plan Note (Signed)
   Thought to be secondary to hypertensive urgency and acute on chronic diastolic congestive heart failure resulting in bilateral pleural effusions.  Had some improvement in dyspnea with temporary BiPAP use.    BiPap was weaned off however patient was noted to have an ongoing need for 3lpm of O2 via [place] with exertion at time of discharge.  Arrangements were made for homegoing oxygen with exertion.  Improved with Intravenous lasix   Transitioned to $RemoveBeforeD'20mg'locXOdyguxUKVn$  of Lasix PO Qdaily at discharge

## 2022-03-10 ENCOUNTER — Inpatient Hospital Stay (HOSPITAL_COMMUNITY): Payer: Medicare HMO

## 2022-03-10 DIAGNOSIS — R0609 Other forms of dyspnea: Secondary | ICD-10-CM

## 2022-03-10 DIAGNOSIS — J90 Pleural effusion, not elsewhere classified: Secondary | ICD-10-CM | POA: Diagnosis not present

## 2022-03-10 DIAGNOSIS — J9600 Acute respiratory failure, unspecified whether with hypoxia or hypercapnia: Secondary | ICD-10-CM | POA: Diagnosis not present

## 2022-03-10 DIAGNOSIS — N184 Chronic kidney disease, stage 4 (severe): Secondary | ICD-10-CM | POA: Diagnosis not present

## 2022-03-10 DIAGNOSIS — J9601 Acute respiratory failure with hypoxia: Secondary | ICD-10-CM | POA: Diagnosis not present

## 2022-03-10 LAB — LACTATE DEHYDROGENASE, PLEURAL OR PERITONEAL FLUID: LD, Fluid: 80 U/L — ABNORMAL HIGH (ref 3–23)

## 2022-03-10 LAB — URINE CULTURE

## 2022-03-10 LAB — BASIC METABOLIC PANEL
Anion gap: 6 (ref 5–15)
BUN: 20 mg/dL (ref 8–23)
CO2: 27 mmol/L (ref 22–32)
Calcium: 8.7 mg/dL — ABNORMAL LOW (ref 8.9–10.3)
Chloride: 105 mmol/L (ref 98–111)
Creatinine, Ser: 1.38 mg/dL — ABNORMAL HIGH (ref 0.44–1.00)
GFR, Estimated: 36 mL/min — ABNORMAL LOW (ref >60)
Glucose, Bld: 145 mg/dL — ABNORMAL HIGH (ref 70–99)
Potassium: 4.1 mmol/L (ref 3.5–5.1)
Sodium: 138 mmol/L (ref 135–145)

## 2022-03-10 LAB — BODY FLUID CELL COUNT WITH DIFFERENTIAL
Eos, Fluid: 0 %
Lymphs, Fluid: 79 %
Monocyte-Macrophage-Serous Fluid: 20 % — ABNORMAL LOW (ref 50–90)
Neutrophil Count, Fluid: 1 % (ref 0–25)
Total Nucleated Cell Count, Fluid: 2718 cu mm — ABNORMAL HIGH (ref 0–1000)

## 2022-03-10 LAB — ECHOCARDIOGRAM COMPLETE
AR max vel: 1.83 cm2
AV Area VTI: 1.8 cm2
AV Area mean vel: 1.76 cm2
AV Mean grad: 8 mmHg
AV Peak grad: 13.8 mmHg
Ao pk vel: 1.86 m/s
Area-P 1/2: 1.89 cm2
Height: 63 in
MV VTI: 1.61 cm2
S' Lateral: 2.2 cm
Weight: 2423.3 oz

## 2022-03-10 LAB — GLUCOSE, CAPILLARY
Glucose-Capillary: 137 mg/dL — ABNORMAL HIGH (ref 70–99)
Glucose-Capillary: 133 mg/dL — ABNORMAL HIGH (ref 70–99)
Glucose-Capillary: 100 mg/dL — ABNORMAL HIGH (ref 70–99)
Glucose-Capillary: 138 mg/dL — ABNORMAL HIGH (ref 70–99)

## 2022-03-10 LAB — PROTEIN, PLEURAL OR PERITONEAL FLUID: Total protein, fluid: 3.2 g/dL

## 2022-03-10 LAB — MAGNESIUM: Magnesium: 2.1 mg/dL (ref 1.7–2.4)

## 2022-03-10 LAB — LACTATE DEHYDROGENASE: LDH: 123 U/L (ref 98–192)

## 2022-03-10 LAB — PROTEIN, TOTAL: Total Protein: 5.5 g/dL — ABNORMAL LOW (ref 6.5–8.1)

## 2022-03-10 MED ORDER — LIDOCAINE HCL 1 % IJ SOLN
INTRAMUSCULAR | Status: AC
  Administered 2022-03-10: 10 mL
  Filled 2022-03-10: qty 20

## 2022-03-10 NOTE — Progress Notes (Signed)
PROGRESS NOTE    Stacey Armstrong  EXN:[id number] DOB: 1927-06-13 DOA: 03/08/2022 PCP: [name], Rayford Halsted, MD    Brief Narrative:   Stacey Armstrong is a 88 y.o. female with past medical history significant for COPD, CKD stage IV, history of CVA, T2DM, HTN, HLD, hard of hearing who presented to the ED for evaluation of shortness of breath. Patient states about 3 weeks ago she began to have swelling in her lower extremities.  She was started on Lasix 20 mg 3 times per week by her PCP.  She says that she has been having good urine output with the Lasix.  However over the last week she has been having progressively worsening dyspnea on exertion, orthopnea, and persistent lower extremity swelling.  She reports having some shaking chills without diaphoresis or subjective fevers.  She reports occasional cough.  She denies any chest pain, nausea, vomiting, abdominal pain.   In the ED, BP 229/63, pulse 67, RR 25, temp 98.1 F, SPO2 96% room air. WBC 7.0, hemoglobin 11.6, platelets 292,000, sodium 139, potassium 3.5, bicarb 29, BUN 18, creatinine 1.19, serum glucose 146, LFTs within normal limits, BNP 304.8, troponin 17 > 16. SARS-CoV-2 and influenza PCR negative.  Urinalysis shows negative nitrites, small leukocytes, 0-5 RBC/hpf, 11-20 WBC/hpf, rare bacteria microscopy.  Urine culture in process. 2 view chest x-ray shows interval appearance of small-moderate bilateral pleural effusions. CT head without contrast negative for acute findings.  Old lacunar infarct seen in the basal ganglia.  Small vessel disease noted. CTA chest was negative for evidence of PE.  Moderate sized bilateral pleural effusions noted.  9 mm groundglass nodular density in the RUL noted. Patient was placed on nitro drip, given IV Lasix 20 mg, IV ceftriaxone 1 g.  Patient briefly placed on BiPAP and since transitioned to room air.  The hospitalist service was consulted to admit for further evaluation and management.    Assessment &  Plan:   Acute respiratory failure Moderate-large bilateral pleural effusions New bilateral pleural effusions which are causing her respiratory symptoms.  She however has not been hypoxic.  Had some improvement in dyspnea with temporary BiPAP use.   --TTE: Pending --Continue IV Lasix 20 mg BID --Monitor strict I/O's and daily weights --Supplemental oxygen if needed --BiPAP prn --IR consulted for consideration of thoracentesis   Hypertensive urgency SBP as high as 222.  Started on IV nitro drip in the ED with mild temporary improvement; which is now titrated off --Amlodipine increased to 10 mg PO daily --Lasix 20mg  IV q12h --Hydralazine 25mg  PO q6h PRN SBP >165 or DBP >110 --Holding bisoprolol due to borderline bradycardia --Continue aspirin and statin --Continue to monitor BP closely   CKD (chronic kidney disease) stage 4, GFR 15-29 ml/min (HCC) Baseline creatinine of 1.40-1.80. --Cr 1.19>1.32>1.38 --BMP daily   History of CVA (cerebrovascular accident) Continue aspirin and statin.   Hyperlipidemia Continue atorvastatin.   Type 2 diabetes mellitus with hyperlipidemia (HCC) Hemoglobin A1c 6.5 on 02/11/2022, well controlled  --sensitive SSI for coverage --CBGs qAC/HS.  Constipation --Senokot-S BID --Miralax BID PRN  Weakness/debility/gait disturbance/deconditioning: -- PT/OT consultation: Pending   DVT prophylaxis: heparin injection 5,000 Units Start: 03/09/22 0600    Code Status: Full Code Family Communication: No family present at bedside this morning, daughter updated yesterday afternoon  Disposition Plan:  Level of care: Telemetry Status is: Inpatient Remains inpatient appropriate because: IV diuresis, IR consultation for thoracentesis; needs further PT evaluation    Consultants:  IR  Procedures:  TTE: Pending Thoracentesis:  Pending  Antimicrobials:  Ceftriaxone 10/14 - 10/14   Subjective: Patient seen examined bedside, resting comfortably.  Lying  in bed.  No family present.  Continues with mild shortness of breath.  Now continues off of BiPAP and nitroglycerin drip titrated off yesterday.  Blood pressure much improved with increased dose of amlodipine.  Awaiting thoracentesis today.  No other specific questions or concerns at this time.  Denies dizziness, no chest pain, no palpitations, no fever/chills/night sweats, no nausea/vomiting/diarrhea, no abdominal pain, no focal weakness, no fatigue, no paresthesias.  No acute events overnight per nursing staff.  Objective: Vitals:   03/10/22 0041 03/10/22 0448 03/10/22 0500 03/10/22 0939  BP: (!) 142/49 (!) 136/52  (!) 111/55  Pulse: 69 66    Resp: 17 20    Temp: 98.2 F (36.8 C) 98.2 F (36.8 C)    TempSrc: Oral Oral    SpO2: 90% 94%    Weight:   68.7 kg   Height:        Intake/Output Summary (Last 24 hours) at 03/10/2022 1016 Last data filed at 03/10/2022 0000 Gross per 24 hour  Intake 482.26 ml  Output --  Net 482.26 ml   Filed Weights   03/08/22 1555 03/10/22 0500  Weight: 68.9 kg 68.7 kg    Examination:  Physical Exam: GEN: NAD, alert and oriented x 3, elderly/chronically ill appearance HEENT: NCAT, PERRL, EOMI, sclera clear, MMM PULM: Breath sounds diminished bilateral bases, greatest on left with mild crackles, no wheezing, normal respiratory effort without accessory muscle use, on 2 L nasal cannula with SPO2 94% at rest CV: Bradycardic, regular rhythm w/o M/G/R GI: abd soft, NTND, NABS, no R/G/M MSK: 1-2+ lower extremity peripheral edema, moves all extremity independently NEURO: CN II-XII intact, no focal deficits, sensation to light touch intact PSYCH: normal mood/affect Integumentary: dry/intact, no rashes or wounds    Data Reviewed: I have personally reviewed following labs and imaging studies  CBC: Recent Labs  Lab 03/08/22 1630 03/09/22 0620  WBC 7.0 9.5  NEUTROABS 4.6  --   HGB 11.6* 11.0*  HCT 37.1 34.5*  MCV 87.5 86.5  PLT 292 350   Basic  Metabolic Panel: Recent Labs  Lab 03/08/22 1630 03/09/22 0620 03/10/22 0357  NA 139 139 138  K 3.5 4.3 4.1  CL 106 104 105  CO2 $Re'29 28 27  'fQN$ GLUCOSE 146* 160* 145*  BUN $Re'18 17 20  'woi$ CREATININE 1.19* 1.32* 1.38*  CALCIUM 8.9 8.7* 8.7*  MG  --  2.2 2.1   GFR: Estimated Creatinine Clearance: 24.2 mL/min (A) (by C-G formula based on SCr of 1.38 mg/dL (H)). Liver Function Tests: Recent Labs  Lab 03/08/22 1630 03/10/22 0357  AST 22  --   ALT 23  --   ALKPHOS 114  --   BILITOT 0.7  --   PROT 6.0* 5.5*  ALBUMIN 3.0*  --    No results for input(s): "LIPASE", "AMYLASE" in the last 168 hours. No results for input(s): "AMMONIA" in the last 168 hours. Coagulation Profile: No results for input(s): "INR", "PROTIME" in the last 168 hours. Cardiac Enzymes: No results for input(s): "CKTOTAL", "CKMB", "CKMBINDEX", "TROPONINI" in the last 168 hours. BNP (last 3 results) No results for input(s): "PROBNP" in the last 8760 hours. HbA1C: No results for input(s): "HGBA1C" in the last 72 hours. CBG: Recent Labs  Lab 03/09/22 0812 03/09/22 1252 03/09/22 1738 03/09/22 2027 03/10/22 0742  GLUCAP 155* 103* 116* 188* 137*   Lipid Profile: No results for  input(s): "CHOL", "HDL", "LDLCALC", "TRIG", "CHOLHDL", "LDLDIRECT" in the last 72 hours. Thyroid Function Tests: No results for input(s): "TSH", "T4TOTAL", "FREET4", "T3FREE", "THYROIDAB" in the last 72 hours. Anemia Panel: No results for input(s): "VITAMINB12", "FOLATE", "FERRITIN", "TIBC", "IRON", "RETICCTPCT" in the last 72 hours. Sepsis Labs: No results for input(s): "PROCALCITON", "LATICACIDVEN" in the last 168 hours.  Recent Results (from the past 240 hour(s))  Resp Panel by RT-PCR (Flu A&B, Covid) Anterior Nasal Swab     Status: None   Collection Time: 03/08/22  4:58 PM   Specimen: Anterior Nasal Swab  Result Value Ref Range Status   SARS Coronavirus 2 by RT PCR NEGATIVE NEGATIVE Final    Comment: (NOTE) SARS-CoV-2 target nucleic  acids are NOT DETECTED.  The SARS-CoV-2 RNA is generally detectable in upper respiratory specimens during the acute phase of infection. The lowest concentration of SARS-CoV-2 viral copies this assay can detect is 138 copies/mL. A negative result does not preclude SARS-Cov-2 infection and should not be used as the sole basis for treatment or other patient management decisions. A negative result may occur with  improper specimen collection/handling, submission of specimen other than nasopharyngeal swab, presence of viral mutation(s) within the areas targeted by this assay, and inadequate number of viral copies(<138 copies/mL). A negative result must be combined with clinical observations, patient history, and epidemiological information. The expected result is Negative.  Fact Sheet for Patients:  EntrepreneurPulse.com.au  Fact Sheet for Healthcare Providers:  IncredibleEmployment.be  This test is no t yet approved or cleared by the [place] FDA and  has been authorized for detection and/or diagnosis of SARS-CoV-2 by FDA under an Emergency Use Authorization (EUA). This EUA will remain  in effect (meaning this test can be used) for the duration of the COVID-19 declaration under Section 564(b)(1) of the Act, 21 U.S.C.section 360bbb-3(b)(1), unless the authorization is terminated  or revoked sooner.       Influenza A by PCR NEGATIVE NEGATIVE Final   Influenza B by PCR NEGATIVE NEGATIVE Final    Comment: (NOTE) The Xpert Xpress SARS-CoV-2/FLU/RSV plus assay is intended as an aid in the diagnosis of influenza from Nasopharyngeal swab specimens and should not be used as a sole basis for treatment. Nasal washings and aspirates are unacceptable for Xpert Xpress SARS-CoV-2/FLU/RSV testing.  Fact Sheet for Patients: EntrepreneurPulse.com.au  Fact Sheet for Healthcare Providers: IncredibleEmployment.be  This  test is not yet approved or cleared by the [place] FDA and has been authorized for detection and/or diagnosis of SARS-CoV-2 by FDA under an Emergency Use Authorization (EUA). This EUA will remain in effect (meaning this test can be used) for the duration of the COVID-19 declaration under Section 564(b)(1) of the Act, 21 U.S.C. section 360bbb-3(b)(1), unless the authorization is terminated or revoked.  Performed at [facility], [place] [address], [place], [place] [address]          Radiology Studies: CT Angio Chest PE W and/or Wo Contrast  Result Date: 03/08/2022 CLINICAL DATA:  High probability for pulmonary embolism. EXAM: CT ANGIOGRAPHY CHEST WITH CONTRAST TECHNIQUE: Multidetector CT imaging of the chest was performed using the standard protocol during bolus administration of intravenous contrast. Multiplanar CT image reconstructions and MIPs were obtained to evaluate the vascular anatomy. RADIATION DOSE REDUCTION: This exam was performed according to the departmental dose-optimization program which includes automated exposure control, adjustment of the mA and/or kV according to patient size and/or use of iterative reconstruction technique. CONTRAST:  19mL OMNIPAQUE IOHEXOL 350 MG/ML SOLN  COMPARISON:  Chest x-ray same day.  CT angiogram chest 09/05/2021. FINDINGS: Cardiovascular: Heart is mildly enlarged. Aorta is normal in size. There are atherosclerotic calcifications of the aorta. There is no pericardial effusion. There is adequate opacification of the pulmonary arteries to the segmental level. There is no evidence for pulmonary embolism. Mediastinum/Nodes: No enlarged mediastinal, hilar, or axillary lymph nodes. Thyroid gland, trachea, and esophagus demonstrate no significant findings. There is a small hiatal hernia. Lungs/Pleura: There are moderate-sized bilateral pleural effusions. 9 mm ground-glass nodular density in the right upper lobe appears unchanged from  prior. There is atelectasis in the bilateral lower lobes and lingula, left greater than right. No pneumothorax. Trachea and central airways are patent. Upper Abdomen: No acute abnormality. Musculoskeletal: Multilevel degenerative changes affect the spine. Review of the MIP images confirms the above findings. IMPRESSION: 1. No evidence for pulmonary embolism. 2. Moderate-sized bilateral pleural effusions. 3. 9 mm ground-glass nodular density in the right upper lobe (stable for 6 months). Continued surveillance recommended with Chest CTs every 2 years until 5 years. If nodule grows or develops solid component(s), consider resection. These guidelines do not apply to immunocompromised patients and patients with cancer. Follow up in patients with significant comorbidities as clinically warranted. For lung cancer screening, adhere to Lung-RADS guidelines. Reference: Radiology. 2017; 284(1):228-43. 4. Mild cardiomegaly. Aortic Atherosclerosis (ICD10-I70.0). Electronically Signed   By: Ronney Asters M.D.   On: 03/08/2022 22:48   CT Head Wo Contrast  Result Date: 03/08/2022 CLINICAL DATA:  Hypertension, headaches, neurological deficit EXAM: CT HEAD WITHOUT CONTRAST TECHNIQUE: Contiguous axial images were obtained from the base of the skull through the vertex without intravenous contrast. RADIATION DOSE REDUCTION: This exam was performed according to the departmental dose-optimization program which includes automated exposure control, adjustment of the mA and/or kV according to patient size and/or use of iterative reconstruction technique. COMPARISON:  01/24/2019 FINDINGS: Brain: No acute intracranial findings are seen. There are no signs of bleeding within the cranium. Cortical sulci are prominent. Few old lacunar infarcts are seen in basal ganglia. There is decreased density in periventricular white matter. Vascular: Unremarkable. Skull: No acute findings are seen. Sinuses/Orbits: Possible small osteoma is seen in left  ethmoid sinus. Other: None. IMPRESSION: No acute intracranial findings are seen in noncontrast CT brain. Atrophy. Old lacunar infarct is seen in basal ganglia. Small-vessel disease. Electronically Signed   By: Elmer Picker M.D.   On: 03/08/2022 16:26   DG Chest 2 View  Result Date: 03/08/2022 CLINICAL DATA:  Shortness of breath EXAM: CHEST - 2 VIEW COMPARISON:  09/04/2021 FINDINGS: Cardiac size is within normal limits. There is interval appearance of small to moderate bilateral pleural effusions, more so on the left side. Pleural effusions limit evaluation of lower lung fields for infiltrates. There are no signs of alveolar pulmonary edema. There is no pneumothorax. Increase in AP diameter of chest suggests COPD. Osteopenia is seen in bony structures. IMPRESSION: There is interval appearance of small to moderate bilateral pleural effusions, more so on the left side. There are no signs of alveolar pulmonary edema. Evaluation of lower lung fields for infiltrates is limited by the effusions. Rest of the lung fields appear clear. Electronically Signed   By: Elmer Picker M.D.   On: 03/08/2022 16:23        Scheduled Meds:  amLODipine  10 mg Oral Daily   aspirin EC  81 mg Oral q AM   atorvastatin  20 mg Oral QHS   furosemide  20 mg Intravenous  BID   heparin  5,000 Units Subcutaneous Q8H   insulin aspart  0-5 Units Subcutaneous QHS   insulin aspart  0-9 Units Subcutaneous TID WC   potassium chloride  20 mEq Oral Daily   senna-docusate  1 tablet Oral BID   sodium chloride flush  3 mL Intravenous Q12H   Continuous Infusions:   LOS: 2 days    Time spent: 48 minutes spent on chart review, discussion with nursing staff, consultants, updating family and interview/physical exam; more than 50% of that time was spent in counseling and/or coordination of care.    Gwendolynn Merkey J [place] DO [facility] Available via Epic secure chat 7am-7pm After these hours, please refer to coverage  provider listed on [email or web address] 03/10/2022, 10:16 AM

## 2022-03-10 NOTE — Procedures (Signed)
PROCEDURE SUMMARY:  Successful image-guided left thoracentesis. Yielded 350 milliliters of clear dark yellow fluid. Patient tolerated procedure well. EBL < 1 mL No immediate complications.  Specimen was sent for labs. Post procedure CXR shows no pneumothorax.  Please see imaging section of Epic for full dictation.  Joaquim Nam PA-C 03/10/2022 12:53 PM

## 2022-03-10 NOTE — TOC Initial Note (Signed)
Transition of Care [facility]) - Initial/Assessment Note    Patient Details  Name: Stacey Armstrong MRN: [id number] Date of Birth: 02-05-1928  Transition of Care [facility]) CM/SW Contact:    Leeroy Cha, RN Phone Number: 03/10/2022, 9:15 AM  Clinical Narrative:                 Transition of Care [facility]) Screening Note   Patient Details  Name: Stacey Armstrong Date of Birth: 1928-04-30   [facility]) CM/SW Contact:    Leeroy Cha, RN Phone Number: 03/10/2022, 9:15 AM    Transition of Care Department [facility]) has reviewed patient and no TOC needs have been identified at this time. We will continue to monitor patient advancement through interdisciplinary progression rounds. If new patient transition needs arise, please place a TOC consult.    Expected Discharge Plan: Home/Self Care Barriers to Discharge: Continued Medical Work up   Patient Goals and CMS Choice Patient states their goals for this hospitalization and ongoing recovery are:: to return home CMS Medicare.gov Compare Post Acute Care list provided to:: Patient    Expected Discharge Plan and Services Expected Discharge Plan: Home/Self Care   Discharge Planning Services: CM Consult   Living arrangements for the past 2 months: Apartment                                      Prior Living Arrangements/Services Living arrangements for the past 2 months: Apartment Lives with:: Self   Do you feel safe going back to the place where you live?: Yes            Criminal Activity/Legal Involvement Pertinent to Current Situation/Hospitalization: No - Comment as needed  Activities of Daily Living Home Assistive Devices/Equipment: Blood pressure cuff, CBG Meter, Walker (specify type), Hearing aid, Shower chair with back, Grab bars around toilet, Grab bars in shower, Eyeglasses, Nebulizer ADL Screening (condition at time of admission) Patient's cognitive ability adequate to safely complete daily  activities?: Yes Is the patient deaf or have difficulty hearing?: Yes Does the patient have difficulty seeing, even when wearing glasses/contacts?: No Does the patient have difficulty concentrating, remembering, or making decisions?: No Patient able to express need for assistance with ADLs?: Yes Does the patient have difficulty dressing or bathing?: No Independently performs ADLs?: No Communication: Independent Dressing (OT): Needs assistance Is this a change from baseline?: Change from baseline, expected to last <3days Grooming: Independent Feeding: Independent Bathing: Needs assistance Is this a change from baseline?: Change from baseline, expected to last <3 days Toileting: Needs assistance Is this a change from baseline?: Change from baseline, expected to last <3 days In/Out Bed: Needs assistance Is this a change from baseline?: Change from baseline, expected to last <3 days Walks in Home: Needs assistance Is this a change from baseline?: Change from baseline, expected to last <3 days Does the patient have difficulty walking or climbing stairs?: Yes Weakness of Legs: Both Weakness of Arms/Hands: Both  Permission Sought/Granted                  Emotional Assessment Appearance:: Appears stated age Attitude/Demeanor/Rapport: Engaged Affect (typically observed): Calm Orientation: : Oriented to Self, Oriented to Place, Oriented to  Time, Oriented to Situation Alcohol / Substance Use: Tobacco Use Psych Involvement: No (comment)  Admission diagnosis:  Acute respiratory failure (HCC) [J96.00] Shortness of breath [R06.02] Chills [R68.83] Acute respiratory failure with hypoxia ([place]) [  J96.01] Hypertension, unspecified type [I10] Patient Active Problem List   Diagnosis Date Noted   Acute respiratory failure with hypoxia ([place]) 03/09/2022   Bilateral pleural effusion 03/08/2022   Hypertensive urgency 03/08/2022   History of CVA (cerebrovascular accident) 03/08/2022   Acute  respiratory failure ([place]) 09/07/2021   Acute viral bronchitis 09/06/2021   CKD (chronic kidney disease) stage 4, GFR 15-29 ml/min ([place]) 09/06/2021   Pulmonary nodule 09/06/2021   Weakness 09/06/2021   Bronchitis 09/05/2021   AKI (acute kidney injury) ([place]) 08/20/2021   Hyperlipidemia 08/20/2021   Hyponatremia 08/20/2021   Anemia in chronic renal disease 08/20/2021   Urolithiasis 08/20/2021   Mild protein malnutrition ([place]) 08/20/2021   Contact dermatitis 07/10/2020   Presbycusis of both ears 07/10/2020   Vasomotor rhinitis 07/10/2020   Fecal impaction ([place]) 12/07/2018   Type 2 diabetes mellitus with hyperlipidemia ([place]) 12/07/2018   Essential hypertension 12/07/2018   Diarrhea 12/07/2018   Nausea and vomiting 12/07/2018   ARF (acute renal failure) ([place]) 12/07/2018   PCP:  [name], Rayford Halsted, MD Pharmacy:   [place], [address] [place] [address] Phone: [phone] Fax: [phone]  [id number] - [place], [place] - [address] [place] [address] Phone: [phone] Fax: [phone]     Social Determinants of Health (SDOH) Interventions    Readmission Risk Interventions   No data to display

## 2022-03-10 NOTE — Evaluation (Signed)
Occupational Therapy Evaluation Patient Details Name: Stacey Armstrong MRN: [id number] DOB: 14-Feb-1928 Today's Date: 03/10/2022   History of Present Illness Patient is a 88 year old female who presented with shortness of breath and 3 week history of BLE edema. patient was found to have acute respiratory failure, moderate-large bilateral pleural effusions,and hypertensive urgency. PMH: COPD, CVA, CKD IV,II DM, HTN, HLD.   Clinical Impression   Patient is a 88 year old female who was admitted for above. Patient was independent in Adls at rollator level at ILF. Patient was min guard for standing at sink and toileting tasks on this date. Anticipate that patient will be able to progress to PLOF prior to d/c back to ILF.  Patient would continue to benefit from skilled OT services at this time while admitted to address noted deficits in order to improve overall safety and independence in ADLs.       Recommendations for follow up therapy are one component of a multi-disciplinary discharge planning process, led by the attending physician.  Recommendations may be updated based on patient status, additional functional criteria and insurance authorization.   Follow Up Recommendations  No OT follow up    Assistance Recommended at Discharge PRN  Patient can return home with the following Assistance with cooking/housework    Functional Status Assessment  Patient has had a recent decline in their functional status and demonstrates the ability to make significant improvements in function in a reasonable and predictable amount of time.  Equipment Recommendations  None recommended by OT    Recommendations for Other Services       Precautions / Restrictions Precautions Precautions: Fall;Other (comment) Precaution Comments: monitor sats, Restrictions Weight Bearing Restrictions: No      Mobility Bed Mobility Overal bed mobility: Modified Independent             General bed mobility comments:  used bedrail, HOB up    Transfers Overall transfer level: Needs assistance Equipment used: Rolling walker (2 wheels) Transfers: Sit to/from Stand Sit to Stand: Supervision           General transfer comment: supervision for safety and VCs for hand placement      Balance Overall balance assessment: Modified Independent                                         ADL either performed or assessed with clinical judgement   ADL Overall ADL's : Needs assistance/impaired Eating/Feeding: Set up;Sitting   Grooming: Wash/dry face;Wash/dry hands;Min guard;Standing   Upper Body Bathing: Sitting;Set up   Lower Body Bathing: Set up;Sit to/from stand;Sitting/lateral leans   Upper Body Dressing : Set up;Sitting   Lower Body Dressing: Min guard;Set up;Sit to/from stand Lower Body Dressing Details (indicate cue type and reason): to don/doff mesh underwear Toilet Transfer: Diplomatic Services operational officer (4 wheels)   Toileting- Clothing Manipulation and Hygiene: Set up;Sit to/from stand       Functional mobility during ADLs: Diplomatic Services operational officer (4 wheels)       Vision Patient Visual Report: No change from baseline       Perception     Praxis      Pertinent Vitals/Pain Pain Assessment Pain Assessment: No/denies pain     Hand Dominance     Extremity/Trunk Assessment Upper Extremity Assessment Upper Extremity Assessment: Overall WFL for tasks assessed   Lower Extremity Assessment Lower Extremity Assessment: Defer to PT evaluation  Cervical / Trunk Assessment Cervical / Trunk Assessment: Normal   Communication Communication Communication: HOH   Cognition Arousal/Alertness: Awake/alert Behavior During Therapy: WFL for tasks assessed/performed Overall Cognitive Status: Difficult to assess                                       General Comments       Exercises     Shoulder Instructions      Home Living Family/patient expects to be  discharged to:: Private residence Living Arrangements: Alone Available Help at Discharge: Family;Available PRN/intermittently Type of Home: Independent living facility Home Access: Level entry     Home Layout: One level     Bathroom Shower/Tub: Occupational psychologist: Handicapped height     Home Equipment: Rollator (4 wheels);Shower seat          Prior Functioning/Environment Prior Level of Function : Independent/Modified Independent             Mobility Comments: walks with rollator, denied falls in past 6 months ADLs Comments: patient reported getting into the shower. Daughter comes several times/week and assists with shopping/transportation.        OT Problem List: Decreased activity tolerance;Impaired balance (sitting and/or standing);Decreased safety awareness;Decreased knowledge of precautions;Decreased knowledge of use of DME or AE      OT Treatment/Interventions: Self-care/ADL training;Therapeutic exercise;Neuromuscular education;Energy conservation;DME and/or AE instruction;Therapeutic activities;Balance training;Patient/family education    OT Goals(Current goals can be found in the care plan section) Acute Rehab OT Goals Patient Stated Goal: to go back to ILF OT Goal Formulation: With patient Time For Goal Achievement: 03/24/22 Potential to Achieve Goals: Fair  OT Frequency: Min 2X/week    Co-evaluation PT/OT/SLP Co-Evaluation/Treatment: Yes Reason for Co-Treatment: To address functional/ADL transfers PT goals addressed during session: Mobility/safety with mobility OT goals addressed during session: ADL's and self-care      AM-PAC OT "6 Clicks" Daily Activity     Outcome Measure Help from another person eating meals?: A Little Help from another person taking care of personal grooming?: A Little Help from another person toileting, which includes using toliet, bedpan, or urinal?: A Lot Help from another person bathing (including washing,  rinsing, drying)?: A Lot Help from another person to put on and taking off regular upper body clothing?: A Little Help from another person to put on and taking off regular lower body clothing?: A Lot 6 Click Score: 15   End of Session Equipment Utilized During Treatment: Gait belt;Rollator (4 wheels) Nurse Communication: Mobility status  Activity Tolerance: Patient tolerated treatment well Patient left: in chair;with call bell/phone within reach;with chair alarm set;with nursing/sitter in room  OT Visit Diagnosis: Unsteadiness on feet (R26.81);Other abnormalities of gait and mobility (R26.89);Repeated falls (R29.6);Muscle weakness (generalized) (M62.81)                Time: 1884-1660 OT Time Calculation (min): 18 min Charges:  OT General Charges $OT Visit: 1 Visit OT Evaluation $OT Eval Low Complexity: 1 Low  Antinio Sanderfer OTR/L, MS Acute Rehabilitation Department Office# [phone]   Stacey Armstrong 03/10/2022, 1:24 PM

## 2022-03-10 NOTE — Progress Notes (Signed)
  Echocardiogram 2D Echocardiogram has been performed.  Stacey Armstrong M 03/10/2022, 12:33 PM

## 2022-03-10 NOTE — Evaluation (Signed)
Physical Therapy Evaluation Patient Details Name: Stacey Armstrong MRN: [id number] DOB: 1927-10-11 Today's Date: 03/10/2022  History of Present Illness  88 y.o. female with past medical history significant for COPD, CKD stage IV, history of CVA, T2DM, HTN, HLD, hard of hearing who presented to the ED for evaluation of shortness of breath, LE swelling. Dx of respiratory failure, B pleural effusions, HTN.  Clinical Impression  Pt admitted with above diagnosis. Pt ambulated 130' with rollator, which she uses at baseline. SaO2 94% on room air at rest, 90% on room air with ambulation. Pt lives at an [place] and has assistance from her daughter several times/week for shopping and transportation. From a mobility standpoint, she is safe to return to this setting.  Pt currently with functional limitations due to the deficits listed below (see PT Problem List). Pt will benefit from skilled PT to increase their independence and safety with mobility to allow discharge to the venue listed below.          Recommendations for follow up therapy are one component of a multi-disciplinary discharge planning process, led by the attending physician.  Recommendations may be updated based on patient status, additional functional criteria and insurance authorization.  Follow Up Recommendations Home health PT      Assistance Recommended at Discharge Set up Supervision/Assistance  Patient can return home with the following  A little help with bathing/dressing/bathroom;Assistance with cooking/housework;Assist for transportation;Help with stairs or ramp for entrance    Equipment Recommendations None recommended by PT  Recommendations for Other Services       Functional Status Assessment Patient has had a recent decline in their functional status and demonstrates the ability to make significant improvements in function in a reasonable and predictable amount of time.     Precautions / Restrictions Precautions Precautions:  Fall;Other (comment) Precaution Comments: monitor sats, denies falls in past 6 months Restrictions Weight Bearing Restrictions: No      Mobility  Bed Mobility Overal bed mobility: Modified Independent             General bed mobility comments: used bedrail, HOB up    Transfers Overall transfer level: Needs assistance Equipment used: Rolling walker (2 wheels) Transfers: Sit to/from Stand Sit to Stand: Supervision           General transfer comment: supervision for safety and hand placement    Ambulation/Gait Ambulation/Gait assistance: Supervision Gait Distance (Feet): 130 Feet Assistive device: Rollator (4 wheels) Gait Pattern/deviations: Step-through pattern, Decreased stride length Gait velocity: WFL     General Gait Details: steady, no loss of balance, SaO2 94% on room air at rest, 90% on room air with ambulation  Stairs            Wheelchair Mobility    Modified Rankin (Stroke Patients Only)       Balance Overall balance assessment: Modified Independent                                           Pertinent Vitals/Pain Pain Assessment Pain Assessment: No/denies pain    Home Living Family/patient expects to be discharged to:: Private residence Living Arrangements: Alone Available Help at Discharge: Family;Available PRN/intermittently Type of Home: Independent living facility Home Access: Level entry       Home Layout: One level Home Equipment: Rollator (4 wheels);Shower seat      Prior Function Prior Level of Function :  Independent/Modified Independent             Mobility Comments: walks with rollator, denied falls in past 6 months ADLs Comments: patient reported getting into the shower. Daughter comes several times/week and assists with shopping/transportation.     Hand Dominance        Extremity/Trunk Assessment   Upper Extremity Assessment Upper Extremity Assessment: Defer to OT evaluation     Lower Extremity Assessment Lower Extremity Assessment: Overall WFL for tasks assessed    Cervical / Trunk Assessment Cervical / Trunk Assessment: Normal  Communication   Communication: [facility]  Cognition                                                General Comments      Exercises     Assessment/Plan    PT Assessment Patient needs continued PT services  PT Problem List Decreased activity tolerance;Cardiopulmonary status limiting activity;Decreased safety awareness       PT Treatment Interventions Gait training;Therapeutic exercise;Functional mobility training    PT Goals (Current goals can be found in the Care Plan section)  Acute Rehab PT Goals Patient Stated Goal: return to ILF PT Goal Formulation: With patient Time For Goal Achievement: 03/24/22 Potential to Achieve Goals: Good    Frequency Min 3X/week     Co-evaluation               AM-PAC PT "6 Clicks" Mobility  Outcome Measure Help needed turning from your back to your side while in a flat bed without using bedrails?: None Help needed moving from lying on your back to sitting on the side of a flat bed without using bedrails?: A Little Help needed moving to and from a bed to a chair (including a wheelchair)?: A Little Help needed standing up from a chair using your arms (e.g., wheelchair or bedside chair)?: A Little Help needed to walk in hospital room?: A Little Help needed climbing 3-5 steps with a railing? : A Little 6 Click Score: 19    End of Session Equipment Utilized During Treatment: Gait belt Activity Tolerance: Patient tolerated treatment well Patient left: in chair;with chair alarm set;with call bell/phone within reach Nurse Communication: Mobility status PT Visit Diagnosis: Difficulty in walking, not elsewhere classified (R26.2)    Time: 1100-1115 PT Time Calculation (min) (ACUTE ONLY): 15 min   Charges:   PT Evaluation $PT Eval Moderate Complexity: 1 Mod          Philomena Doheny PT 03/10/2022  Acute Rehabilitation Services  Office [phone]

## 2022-03-11 ENCOUNTER — Telehealth: Payer: Self-pay | Admitting: Internal Medicine

## 2022-03-11 DIAGNOSIS — J9601 Acute respiratory failure with hypoxia: Secondary | ICD-10-CM | POA: Diagnosis not present

## 2022-03-11 DIAGNOSIS — J90 Pleural effusion, not elsewhere classified: Secondary | ICD-10-CM | POA: Diagnosis not present

## 2022-03-11 DIAGNOSIS — N184 Chronic kidney disease, stage 4 (severe): Secondary | ICD-10-CM | POA: Diagnosis not present

## 2022-03-11 DIAGNOSIS — J9600 Acute respiratory failure, unspecified whether with hypoxia or hypercapnia: Secondary | ICD-10-CM | POA: Diagnosis not present

## 2022-03-11 LAB — BASIC METABOLIC PANEL
Anion gap: 7 (ref 5–15)
BUN: 24 mg/dL — ABNORMAL HIGH (ref 8–23)
CO2: 25 mmol/L (ref 22–32)
Calcium: 8.6 mg/dL — ABNORMAL LOW (ref 8.9–10.3)
Chloride: 102 mmol/L (ref 98–111)
Creatinine, Ser: 1.6 mg/dL — ABNORMAL HIGH (ref 0.44–1.00)
GFR, Estimated: 30 mL/min — ABNORMAL LOW (ref >60)
Glucose, Bld: 111 mg/dL — ABNORMAL HIGH (ref 70–99)
Potassium: 4.3 mmol/L (ref 3.5–5.1)
Sodium: 134 mmol/L — ABNORMAL LOW (ref 135–145)

## 2022-03-11 LAB — GLUCOSE, CAPILLARY
Glucose-Capillary: 110 mg/dL — ABNORMAL HIGH (ref 70–99)
Glucose-Capillary: 111 mg/dL — ABNORMAL HIGH (ref 70–99)
Glucose-Capillary: 90 mg/dL (ref 70–99)
Glucose-Capillary: 119 mg/dL — ABNORMAL HIGH (ref 70–99)

## 2022-03-11 LAB — CYTOLOGY - NON PAP

## 2022-03-11 NOTE — Progress Notes (Signed)
SATURATION QUALIFICATIONS: (This note is used to comply with regulatory documentation for home oxygen)  Patient Saturations on Room Air at Rest = 96%  Patient Saturations on Room Air while Ambulating = 75%  Patient Saturations on 3 Liters of oxygen while Ambulating = 95%  Please briefly explain why patient needs home oxygen: pt desats walking 100 ft to mid 70s% while on RA, prolonged desat when returning to rest without supplemental oxygen.

## 2022-03-11 NOTE — Progress Notes (Signed)
Mobility Specialist - Progress Note  03/11/22 1446  Mobility  Activity Ambulated with assistance in hallway  Level of Assistance Modified independent, requires aide device or extra time  Assistive Device Four wheel walker  Distance Ambulated (ft) 500 ft  Activity Response Tolerated well  Mobility Referral Yes  $Mobility charge 1 Mobility   Pt received in recliner and agreeable to mobility. No complaints during mobility.Pt to bed after session with all needs met.    [facility]

## 2022-03-11 NOTE — Telephone Encounter (Signed)
Pt daughter want dr.Hernandez to know pt is being discharge tomorrow.

## 2022-03-11 NOTE — Progress Notes (Signed)
PROGRESS NOTE    Chailyn Racette  JSE:[id number] DOB: [birth year] DOA: 03/08/2022 PCP: [name], Rayford Halsted, MD    Brief Narrative:   Stacey Armstrong is a 88 y.o. female with past medical history significant for COPD, CKD stage IV, history of CVA, T2DM, HTN, HLD, hard of hearing who presented to the ED for evaluation of shortness of breath. Patient states about 3 weeks ago she began to have swelling in her lower extremities.  She was started on Lasix 20 mg 3 times per week by her PCP.  She says that she has been having good urine output with the Lasix.  However over the last week she has been having progressively worsening dyspnea on exertion, orthopnea, and persistent lower extremity swelling.  She reports having some shaking chills without diaphoresis or subjective fevers.  She reports occasional cough.  She denies any chest pain, nausea, vomiting, abdominal pain.   In the ED, BP 229/63, pulse 67, RR 25, temp 98.1 F, SPO2 96% room air. WBC 7.0, hemoglobin 11.6, platelets 292,000, sodium 139, potassium 3.5, bicarb 29, BUN 18, creatinine 1.19, serum glucose 146, LFTs within normal limits, BNP 304.8, troponin 17 > 16. SARS-CoV-2 and influenza PCR negative.  Urinalysis shows negative nitrites, small leukocytes, 0-5 RBC/hpf, 11-20 WBC/hpf, rare bacteria microscopy.  Urine culture in process. 2 view chest x-ray shows interval appearance of small-moderate bilateral pleural effusions. CT head without contrast negative for acute findings.  Old lacunar infarct seen in the basal ganglia.  Small vessel disease noted. CTA chest was negative for evidence of PE.  Moderate sized bilateral pleural effusions noted.  9 mm groundglass nodular density in the RUL noted. Patient was placed on nitro drip, given IV Lasix 20 mg, IV ceftriaxone 1 g.  Patient briefly placed on BiPAP and since transitioned to room air.  The hospitalist service was consulted to admit for further evaluation and management.    Assessment &  Plan:   Acute respiratory failure Moderate-large bilateral pleural effusions New bilateral pleural effusions which are causing her respiratory symptoms.  She however has not been hypoxic.  Had some improvement in dyspnea with temporary BiPAP use.  TTE with LVEF 65 to 70%, moderate concentric LVH, no LV regional wall motion abnormalities, diastolic function indeterminate due to severe MAC, trivial MR, severe calcification of the aortic valve, trivial aortic regurgitation, mild aortic stenosis, IVC normal in size.  Underwent thoracentesis with 350 mL of fluid removed on the left on 10/17. --Continue IV Lasix 20 mg BID --Monitor strict I/O's and daily weights --Supplemental oxygen if needed --BiPAP prn --Ambulatory O2 screen tomorrow morning   Hypertensive urgency SBP as high as 222.  Started on IV nitro drip in the ED with mild temporary improvement; which is now titrated off --Amlodipine increased to 10 mg PO daily --Lasix 5m IV q12h --Hydralazine 269mPO q6h PRN SBP >165 or DBP >110 --Holding bisoprolol due to borderline bradycardia --Continue aspirin and statin --Continue to monitor BP closely   CKD (chronic kidney disease) stage 4, GFR 15-29 ml/min (HCC) Baseline creatinine of 1.40-1.80. --Cr 1.19>1.32>1.38>1.60 --BMP daily   History of CVA (cerebrovascular accident) Continue aspirin and statin.   Hyperlipidemia Continue atorvastatin.   Type 2 diabetes mellitus with hyperlipidemia (HCC) Hemoglobin A1c 6.5 on 02/11/2022, well controlled  --sensitive SSI for coverage --CBGs qAC/HS.  Constipation --Senokot-S BID --Miralax BID PRN  Weakness/debility/gait disturbance/deconditioning: -- PT recommends home health, OT with no recommendations -- Continue therapy efforts while inpatient   DVT prophylaxis: heparin injection 5,000  Units Start: 03/09/22 0600    Code Status: Full Code Family Communication: Updated daughter present at bedside this morning  Disposition Plan:   Level of care: Telemetry Status is: Inpatient Remains inpatient appropriate because: IV diuresis, and a likely discharge home tomorrow  Consultants:  IR  Procedures:  TTE:  Thoracentesis:   Antimicrobials:  Ceftriaxone 10/14 - 10/14   Subjective: Patient seen examined bedside, resting comfortably.  Lying in bed.  Present.  Blood pressure much improved with increased dose of amlodipine.  Underwent thoracentesis by IR yesterday.  Shortness of breath much improved.  Now on room air.  No other specific questions or concerns at this time.  Denies dizziness, no chest pain, no palpitations, no fever/chills/night sweats, no nausea/vomiting/diarrhea, no abdominal pain, no focal weakness, no fatigue, no paresthesias.  No acute events overnight per nursing staff.  Objective: Vitals:   03/10/22 1331 03/10/22 2116 03/11/22 0417 03/11/22 0544  BP: (!) 177/53 (!) 138/59 (!) 144/103   Pulse: 65 60 62   Resp: _0 Temp: (!) 97.4 F (36.3 C) 98.1 F (36.7 C) 97.8 F (36.6 C)   TempSrc: Oral  Oral   SpO2: 95% 95% 92%   Weight:    70 kg  Height:        Intake/Output Summary (Last 24 hours) at 03/11/2022 1050 Last data filed at 03/11/2022 1045 Gross per 24 hour  Intake 400 ml  Output 100 ml  Net 300 ml   Filed Weights   03/08/22 1555 03/10/22 0500 03/11/22 0544  Weight: 68.9 kg 68.7 kg 70 kg    Examination:  Physical Exam: GEN: NAD, alert and oriented x 3, elderly/chronically ill appearance HEENT: NCAT, PERRL, EOMI, sclera clear, MMM PULM: Sounds slightly diminished bilateral bases, normal Respaire effort without accessory muscle use, no wheezes/crackles, now on room air CV: Bradycardic, regular rhythm w/o M/G/R GI: abd soft, NTND, NABS, no R/G/M MSK: 1+ lower extremity peripheral edema, moves all extremity independently NEURO: CN II-XII intact, no focal deficits, sensation to light touch intact PSYCH: normal mood/affect Integumentary: dry/intact, no rashes or  wounds    Data Reviewed: I have personally reviewed following labs and imaging studies  CBC: Recent Labs  Lab 03/08/22 1630 03/09/22 0620  WBC 7.0 9.5  NEUTROABS 4.6  --   HGB 11.6* 11.0*  HCT 37.1 34.5*  MCV 87.5 86.5  PLT 292 497   Basic Metabolic Panel: Recent Labs  Lab 03/08/22 1630 03/09/22 0620 03/10/22 0357 03/11/22 0449  NA 139 139 138 134*  K 3.5 4.3 4.1 4.3  CL 106 104 105 102  CO2 _1 GLUCOSE 146* 160* 145* 111*  BUN _2 24*  CREATININE 1.19* 1.32* 1.38* 1.60*  CALCIUM 8.9 8.7* 8.7* 8.6*  MG  --  2.2 2.1  --    GFR: Estimated Creatinine Clearance: 21 mL/min (A) (by C-G formula based on SCr of 1.6 mg/dL (H)). Liver Function Tests: Recent Labs  Lab 03/08/22 1630 03/10/22 0357  AST 22  --   ALT 23  --   ALKPHOS 114  --   BILITOT 0.7  --   PROT 6.0* 5.5*  ALBUMIN 3.0*  --    No results for input(s): "LIPASE", "AMYLASE" in the last 168 hours. No results for input(s): "AMMONIA" in the last 168 hours. Coagulation Profile: No results for input(s): "INR", "PROTIME" in the last 168 hours. Cardiac Enzymes: No results for input(s): "CKTOTAL", "CKMB", "CKMBINDEX", "TROPONINI" in the last 168  hours. BNP (last 3 results) No results for input(s): "PROBNP" in the last 8760 hours. HbA1C: No results for input(s): "HGBA1C" in the last 72 hours. CBG: Recent Labs  Lab 03/10/22 0742 03/10/22 1125 03/10/22 1708 03/10/22 2115 03/11/22 0741  GLUCAP 137* 133* 100* 138* 110*   Lipid Profile: No results for input(s): "CHOL", "HDL", "LDLCALC", "TRIG", "CHOLHDL", "LDLDIRECT" in the last 72 hours. Thyroid Function Tests: No results for input(s): "TSH", "T4TOTAL", "FREET4", "T3FREE", "THYROIDAB" in the last 72 hours. Anemia Panel: No results for input(s): "VITAMINB12", "FOLATE", "FERRITIN", "TIBC", "IRON", "RETICCTPCT" in the last 72 hours. Sepsis Labs: No results for input(s): "PROCALCITON", "LATICACIDVEN" in the last 168 hours.  Recent Results  (from the past 240 hour(s))  Resp Panel by RT-PCR (Flu A&B, Covid) Anterior Nasal Swab     Status: None   Collection Time: 03/08/22  4:58 PM   Specimen: Anterior Nasal Swab  Result Value Ref Range Status   SARS Coronavirus 2 by RT PCR NEGATIVE NEGATIVE Final    Comment: (NOTE) SARS-CoV-2 target nucleic acids are NOT DETECTED.  The SARS-CoV-2 RNA is generally detectable in upper respiratory specimens during the acute phase of infection. The lowest concentration of SARS-CoV-2 viral copies this assay can detect is 138 copies/mL. A negative result does not preclude SARS-Cov-2 infection and should not be used as the sole basis for treatment or other patient management decisions. A negative result may occur with  improper specimen collection/handling, submission of specimen other than nasopharyngeal swab, presence of viral mutation(s) within the areas targeted by this assay, and inadequate number of viral copies(<138 copies/mL). A negative result must be combined with clinical observations, patient history, and epidemiological information. The expected result is Negative.  Fact Sheet for Patients:  EntrepreneurPulse.com.au  Fact Sheet for Healthcare Providers:  IncredibleEmployment.be  This test is no t yet approved or cleared by the [place] FDA and  has been authorized for detection and/or diagnosis of SARS-CoV-2 by FDA under an Emergency Use Authorization (EUA). This EUA will remain  in effect (meaning this test can be used) for the duration of the COVID-19 declaration under Section 564(b)(1) of the Act, 21 U.S.C.section 360bbb-3(b)(1), unless the authorization is terminated  or revoked sooner.       Influenza A by PCR NEGATIVE NEGATIVE Final   Influenza B by PCR NEGATIVE NEGATIVE Final    Comment: (NOTE) The Xpert Xpress SARS-CoV-2/FLU/RSV plus assay is intended as an aid in the diagnosis of influenza from Nasopharyngeal swab specimens  and should not be used as a sole basis for treatment. Nasal washings and aspirates are unacceptable for Xpert Xpress SARS-CoV-2/FLU/RSV testing.  Fact Sheet for Patients: EntrepreneurPulse.com.au  Fact Sheet for Healthcare Providers: IncredibleEmployment.be  This test is not yet approved or cleared by the [place] FDA and has been authorized for detection and/or diagnosis of SARS-CoV-2 by FDA under an Emergency Use Authorization (EUA). This EUA will remain in effect (meaning this test can be used) for the duration of the COVID-19 declaration under Section 564(b)(1) of the Act, 21 U.S.C. section 360bbb-3(b)(1), unless the authorization is terminated or revoked.  Performed at [facility], [place] [address], [place], [place] [address]   Urine Culture     Status: Abnormal   Collection Time: 03/08/22  8:45 PM   Specimen: Urine, Clean Catch  Result Value Ref Range Status   Specimen Description   Final    URINE, CLEAN CATCH Performed at [facility], [place] [address] [place], [place] [address]  Special Requests   Final    NONE Performed at [facility], [place] [address] [place], [place] [address]    Culture MULTIPLE SPECIES PRESENT, SUGGEST RECOLLECTION (A)  Final   Report Status 03/10/2022 FINAL  Final  Body fluid culture w Gram Stain     Status: None (Preliminary result)   Collection Time: 03/10/22 12:43 PM   Specimen: PATH Cytology Pleural fluid  Result Value Ref Range Status   Specimen Description   Final    PLEURAL Performed at [place] [address], [place], [place] [address]    Special Requests   Final    NONE Performed at [facility], [place] [address], [place], [place] [address]    Gram Stain   Final    RARE WBC PRESENT, PREDOMINANTLY MONONUCLEAR NO ORGANISMS SEEN    Culture   Final    NO GROWTH < 24 HOURS Performed at [facility], [place] [address] [place], [place] [address]    Report Status PENDING  Incomplete         Radiology Studies: US THORACENTESIS ASP PLEURAL SPACE W/IMG GUIDE  Result Date: 03/10/2022 INDICATION: Patient admitted with acute respiratory failure, new bilateral pleural effusions. Request for diagnotic and therapeutic thoracentesis EXAM: ULTRASOUND GUIDED LEFT THORACENTESIS MEDICATIONS: 7 mL 1% lidocaine COMPLICATIONS: None immediate. PROCEDURE: An ultrasound guided thoracentesis was thoroughly discussed with the patient and questions answered. The benefits, risks, alternatives and complications were also discussed. The patient understands and wishes to proceed with the procedure. Written consent was obtained. Ultrasound was performed to localize and mark an adequate pocket of fluid in the left chest. The area was then prepped and draped in the normal sterile fashion. 1% Lidocaine was used for local anesthesia. Under ultrasound guidance a 6 Fr Safe-T-Centesis catheter was introduced. Thoracentesis was performed. The catheter was removed and a dressing applied. FINDINGS: A total of approximately 350 mL of clear dark yellow fluid was removed. Samples were sent to the laboratory as requested by the clinical team. IMPRESSION: Successful ultrasound guided left thoracentesis yielding 350 mL of pleural fluid. Read by Candiss Norse, PA-C Electronically Signed   By: Corrie Mckusick D.O.   On: 03/10/2022 13:47   DG CHEST PORT 1 VIEW  Result Date: 03/10/2022 CLINICAL DATA:  Status post left thoracentesis EXAM: PORTABLE CHEST 1 VIEW COMPARISON:  03/08/2022 chest radiograph. FINDINGS: Stable cardiomediastinal silhouette with mild cardiomegaly. No pneumothorax. Small left pleural effusion, decreased. Stable small right pleural effusion. No pulmonary edema. Patchy bibasilar lung opacities, similar. IMPRESSION: 1. No pneumothorax. Small left pleural effusion, decreased. Stable small right pleural effusion.  2. Stable patchy bibasilar lung opacities, favor atelectasis. 3. Stable mild cardiomegaly without pulmonary edema. Electronically Signed   By: Ilona Sorrel M.D.   On: 03/10/2022 13:18   ECHOCARDIOGRAM COMPLETE  Result Date: 03/10/2022    ECHOCARDIOGRAM REPORT   Patient Name:   DONYEL CASTAGNOLA Date of Exam: 03/10/2022 Medical Rec #:  [id number]     Height:       63.0 in Accession #:    [id number]    Weight:       151.5 lb Date of Birth:  01-Jan-1928     BSA:          1.718 m Patient Age:    42 years      BP:           111/55 mmHg Patient Gender: F  HR:           65 bpm. Exam Location:  Inpatient Procedure: 2D Echo, Cardiac Doppler and Color Doppler Indications:    Dyspnea R06.00  History:        Patient has prior history of Echocardiogram examinations, most                 recent 12/08/2018. Risk Factors:Hypertension, Diabetes and                 Dyslipidemia.  Sonographer:    Darlina Sicilian RDCS Referring Phys: [id number] VISHAL R PATEL  Sonographer Comments: Apical PEDOF not obtained, patient going for thoracentesis. IMPRESSIONS  1. Left ventricular ejection fraction, by estimation, is 65 to 70%. The left ventricle has normal function. The left ventricle has no regional wall motion abnormalities. There is moderate concentric left ventricular hypertrophy. Diastolic function indeterminant due to severe MAC.  2. Right ventricular systolic function is normal. The right ventricular size is normal. Mildly increased right ventricular wall thickness.  3. Left atrial size was mildly dilated.  4. The mitral valve is degenerative. Trivial mitral valve regurgitation. Severe mitral annular calcification.  5. The aortic valve is tricuspid. There is severe calcifcation of the aortic valve. There is severe thickening of the aortic valve. Aortic valve regurgitation is trivial. There is borderline mild aortic stenosis. Aortic valve mean gradient measures 8.0 mmHg. Aortic valve Vmax measures 1.86 m/s.  6. The inferior vena  cava is normal in size with greater than 50% respiratory variability, suggesting right atrial pressure of 3 mmHg. Comparison(s): Compared to prior TTE in 2020, there is no signficant change. Borderline mild AS on current study. FINDINGS  Left Ventricle: Left ventricular ejection fraction, by estimation, is 65 to 70%. The left ventricle has normal function. The left ventricle has no regional wall motion abnormalities. The left ventricular internal cavity size was normal in size. There is  moderate concentric left ventricular hypertrophy. Diastolic function indeterminant due to severe MAC. Right Ventricle: The right ventricular size is normal. Mildly increased right ventricular wall thickness. Right ventricular systolic function is normal. Left Atrium: Left atrial size was mildly dilated. Right Atrium: Right atrial size was normal in size. Pericardium: There is no evidence of pericardial effusion. Mitral Valve: The mitral valve is degenerative in appearance. There is mild thickening of the mitral valve leaflet(s). There is mild calcification of the mitral valve leaflet(s). Severe mitral annular calcification. Trivial mitral valve regurgitation. MV  peak gradient, 8.8 mmHg. The mean mitral valve gradient is 2.0 mmHg. Tricuspid Valve: The tricuspid valve is normal in structure. Tricuspid valve regurgitation is trivial. Aortic Valve: The aortic valve is tricuspid. There is severe calcifcation of the aortic valve. There is severe thickening of the aortic valve. Aortic valve regurgitation is trivial. There is borderline mild aortic stenosis. Aortic valve mean gradient measures 8.0 mmHg. Aortic valve peak gradient measures 13.8 mmHg. Aortic valve area, by VTI measures 1.80 cm. Pulmonic Valve: The pulmonic valve was normal in structure. Pulmonic valve regurgitation is trivial. Aorta: The aortic root and ascending aorta are structurally normal, with no evidence of dilitation. Venous: The inferior vena cava is normal in size  with greater than 50% respiratory variability, suggesting right atrial pressure of 3 mmHg. IAS/Shunts: The atrial septum is grossly normal.  LEFT VENTRICLE PLAX 2D LVIDd:         3.00 cm   Diastology LVIDs:         2.20 cm   LV e' medial:  2.82 cm/s LV PW:         1.30 cm   LV E/e' medial:  23.9 LV IVS:        1.80 cm   LV e' lateral:   4.74 cm/s LVOT diam:     1.90 cm   LV E/e' lateral: 14.2 LV SV:         68 LV SV Index:   39 LVOT Area:     2.84 cm  RIGHT VENTRICLE RV S prime:     8.11 cm/s TAPSE (M-mode): 1.4 cm LEFT ATRIUM             Index LA diam:        3.20 cm 1.86 cm/m LA Vol (A2C):   31.6 ml 18.39 ml/m LA Vol (A4C):   31.7 ml 18.45 ml/m LA Biplane Vol: 33.2 ml 19.32 ml/m  AORTIC VALVE AV Area (Vmax):    1.83 cm AV Area (Vmean):   1.76 cm AV Area (VTI):     1.80 cm AV Vmax:           186.00 cm/s AV Vmean:          125.667 cm/s AV VTI:            0.376 m AV Peak Grad:      13.8 mmHg AV Mean Grad:      8.0 mmHg LVOT Vmax:         120.00 cm/s LVOT Vmean:        78.100 cm/s LVOT VTI:          0.239 m LVOT/AV VTI ratio: 0.64  AORTA Ao Root diam: 3.00 cm Ao Asc diam:  2.80 cm MITRAL VALVE MV Area (PHT): 1.89 cm     SHUNTS MV Area VTI:   1.61 cm     Systemic VTI:  0.24 m MV Peak grad:  8.8 mmHg     Systemic Diam: 1.90 cm MV Mean grad:  2.0 mmHg MV Vmax:       1.48 m/s MV Vmean:      71.4 cm/s MV Decel Time: 402 msec MV E velocity: 67.30 cm/s MV A velocity: 127.00 cm/s MV E/A ratio:  0.53 Gwyndolyn Kaufman MD Electronically signed by Gwyndolyn Kaufman MD Signature Date/Time: 03/10/2022/12:53:23 PM    Final         Scheduled Meds:  amLODipine  10 mg Oral Daily   aspirin EC  81 mg Oral q AM   atorvastatin  20 mg Oral QHS   furosemide  20 mg Intravenous BID   heparin  5,000 Units Subcutaneous Q8H   insulin aspart  0-5 Units Subcutaneous QHS   insulin aspart  0-9 Units Subcutaneous TID WC   potassium chloride  20 mEq Oral Daily   senna-docusate  1 tablet Oral BID   sodium chloride flush  3  mL Intravenous Q12H   Continuous Infusions:   LOS: 3 days    Time spent: 48 minutes spent on chart review, discussion with nursing staff, consultants, updating family and interview/physical exam; more than 50% of that time was spent in counseling and/or coordination of care.    Tiani Stanbery J [place] DO [facility] Available via Epic secure chat 7am-7pm After these hours, please refer to coverage provider listed on [email or web address] 03/11/2022, 10:50 AM

## 2022-03-11 NOTE — Care Management Important Message (Signed)
Important Message  Patient Details IM Letter placed in Patient's room. Name: Stacey Armstrong MRN: [id number] Date of Birth: [birth year]   Medicare Important Message Given:  Yes     Kerin Salen 03/11/2022, 1:00 PM

## 2022-03-12 DIAGNOSIS — J9601 Acute respiratory failure with hypoxia: Secondary | ICD-10-CM | POA: Diagnosis not present

## 2022-03-12 DIAGNOSIS — I5033 Acute on chronic diastolic (congestive) heart failure: Secondary | ICD-10-CM | POA: Diagnosis present

## 2022-03-12 DIAGNOSIS — N184 Chronic kidney disease, stage 4 (severe): Secondary | ICD-10-CM | POA: Diagnosis not present

## 2022-03-12 DIAGNOSIS — J90 Pleural effusion, not elsewhere classified: Secondary | ICD-10-CM | POA: Diagnosis not present

## 2022-03-12 LAB — BASIC METABOLIC PANEL
Anion gap: 7 (ref 5–15)
BUN: 24 mg/dL — ABNORMAL HIGH (ref 8–23)
CO2: 27 mmol/L (ref 22–32)
Calcium: 8.6 mg/dL — ABNORMAL LOW (ref 8.9–10.3)
Chloride: 102 mmol/L (ref 98–111)
Creatinine, Ser: 1.5 mg/dL — ABNORMAL HIGH (ref 0.44–1.00)
GFR, Estimated: 32 mL/min — ABNORMAL LOW (ref >60)
Glucose, Bld: 101 mg/dL — ABNORMAL HIGH (ref 70–99)
Potassium: 4.2 mmol/L (ref 3.5–5.1)
Sodium: 136 mmol/L (ref 135–145)

## 2022-03-12 LAB — GLUCOSE, CAPILLARY
Glucose-Capillary: 89 mg/dL (ref 70–99)
Glucose-Capillary: 98 mg/dL (ref 70–99)

## 2022-03-12 MED ORDER — SENNOSIDES-DOCUSATE SODIUM 8.6-50 MG PO TABS: 1.0000 | ORAL_TABLET | Freq: Two times a day (BID) | ORAL | 2 refills | Status: DC

## 2022-03-12 MED ORDER — FUROSEMIDE 40 MG PO TABS: 40.0000 mg | ORAL_TABLET | Freq: Every day | ORAL | 2 refills | Status: DC

## 2022-03-12 MED ORDER — POTASSIUM CHLORIDE CRYS ER 20 MEQ PO TBCR: 20.0000 meq | EXTENDED_RELEASE_TABLET | Freq: Every day | ORAL | 2 refills | Status: DC

## 2022-03-12 MED ORDER — AMLODIPINE BESYLATE 10 MG PO TABS: 10.0000 mg | ORAL_TABLET | Freq: Every day | ORAL | 2 refills | Status: DC

## 2022-03-12 MED ORDER — FUROSEMIDE 20 MG PO TABS: 20.0000 mg | ORAL_TABLET | Freq: Every day | ORAL | 2 refills | Status: DC

## 2022-03-12 NOTE — Discharge Instructions (Signed)
Please take all medications as instructed Please follow up with your primary care provider in 1-2 weeks or upon first available appointment Please return to the ED if you develop chest pain, shortness of breath, weakness or fever.

## 2022-03-12 NOTE — Assessment & Plan Note (Signed)
See A/P above

## 2022-03-12 NOTE — TOC Transition Note (Signed)
Transition of Care [facility]) - CM/SW Discharge Note   Patient Details  Name: Anila Bojarski MRN: [id number] Date of Birth: 1927-12-23  [facility]) CM/SW Contact:  Leeroy Cha, RN Phone Number: 03/12/2022, 11:24 AM   Clinical Narrative:     Home and travel 02 arranged through [employer]  Patient dcd to return home on 3l o2 via [place]  Final next level of care: [place] Barriers to Discharge: Barriers Resolved   Patient Goals and CMS Choice Patient states their goals for this hospitalization and ongoing recovery are:: to return home CMS Medicare.gov Compare Post Acute Care list provided to:: Patient Choice offered to / list presented to : Patient  Discharge Placement                       Discharge Plan and Services   Discharge Planning Services: CM Consult Post Acute Care Choice: Durable Medical Equipment          DME Arranged: Oxygen DME Agency: [employer] Date DME Agency Contacted: 03/12/22 Time DME Agency Contacted: [phone] Representative spoke with at DME Agency: zack [email or web address] AND DANIELLE            Social Determinants of Health ([place]) Interventions     Readmission Risk Interventions   No data to display

## 2022-03-12 NOTE — Telephone Encounter (Signed)
Pt was released from the hospital. Pt is scheduled for an OV on 03/18/22. Daughter called to ask if Pt should come in sooner? Please advise.

## 2022-03-12 NOTE — Consult Note (Addendum)
   [facility] CM Inpatient Consult   03/12/2022  Stacey Armstrong 1928/03/31 [id number]  [facility] [ACO] Patient: [name]:  [name], Rayford Halsted, MD, [place] [facility], is a provider that is listed for the Transition of Care follow up    Patient screened for hospitalization with noted high risk score for unplanned readmission risk and to assess for potential [facility] service needs for post hospital transition.  Review of patient's medical record reveals patient is may need oxygen needs follow up. 11:20 am  1st call attempt to hospital bedside phone without success. Spoke with patient via cell phone listed in Epic, explained nature of call and regarding post hospital follow up. Patient agrees to follow up.  11:07 am Plan:  Will reach out to inpatient team for follow up needs with [facility].   11:20 am Will make referral request to [facility] team.  1400: ongoing secure chats for post hospital needs noted  For questions contact:   Natividad Brood, RN BSN [place]  [phone] business mobile phone Toll free office [phone]  *[place]  [phone] Fax number: [phone] [email or web address]

## 2022-03-12 NOTE — Discharge Summary (Addendum)
Physician Discharge Summary   Patient: Stacey Armstrong MRN: [id number] DOB: Feb 06, 1928  Admit date:     03/08/2022  Discharge date: 03/12/22  Discharge Physician: Vernelle Emerald   PCP: [name], Rayford Halsted, MD   Recommendations at discharge:    Please follow up with your PCP in 1-2 weeks Please use your medications as directed Please use your supplemental oxygen as instructed  3 liters per minute with exertion Weight yourself daily, notify  your PCP if this has incrased by more than 2 pounds in one day or 5 pounds in one week.  Discharge Diagnoses: Principal Problem:   Acute respiratory failure ([place]) Active Problems:   Acute on chronic diastolic CHF (congestive heart failure) (HCC)   Hypertensive urgency   CKD (chronic kidney disease) stage 4, GFR 15-29 ml/min (HCC)   Bilateral pleural effusion   Type 2 diabetes mellitus with hyperlipidemia (HCC)   Hyperlipidemia   History of CVA (cerebrovascular accident)   Acute respiratory failure with hypoxia ([place])  Resolved Problems:   * No resolved hospital problems. *  Hospital Course: Stacey Armstrong is a 88 y.o. female with medical history significant for COPD, CKD stage IV, history of CVA, T2DM, HTN, HLD who is admitted with acute respiratory failure due to bilateral pleural effusions and hypertensive urgency.  Assessment and Plan: * Acute respiratory failure (HCC) Thought to be secondary to hypertensive urgency and acute on chronic diastolic congestive heart failure resulting in bilateral pleural effusions. Had some improvement in dyspnea with temporary BiPAP use.   BiPap was weaned off however patient was noted to have an ongoing need for 3lpm of O2 via Ong with exertion at time of discharge.  Arrangements were made for homegoing oxygen with exertion. Improved with Intravenous lasix  Transitioned to 59m of Lasix PO Qdaily at discharge  Acute on chronic diastolic CHF (congestive heart failure) ([place] See A/P  above  Hypertensive urgency SBP as high as 222 on admission.   Started on IV nitro drip in the ED with mild temporary improvement with concurrent transient BiPAP therapy Home regimen of Amlodipine was titrated upwards to 169mQdaily Nitroglycerin drip was weaned off was able  Lasix titrated as above  Bilateral pleural effusion See A/P above  CKD (chronic kidney disease) stage 4, GFR 15-29 ml/min (HCC) Renal function actually improved from baseline with creatinine 1.19.    History of CVA (cerebrovascular accident) Aspirin and statin were continued  Hyperlipidemia Atorvastatin  Type 2 diabetes mellitus with hyperlipidemia ([place] with sensitive sliding scale      [place]. and patient was instructed, not to drive, operate heavy machinery, perform activities at heights, swimming or participation in water activities or provide baby-sitting services while on Pain, Sleep and Anxiety Medications; until their outpatient Physician has advised to do so again. Also recommended to not to take more than prescribed Pain, Sleep and Anxiety Medications.  Consultants: None Procedures performed: None  Disposition: Assisted living Diet recommendation:  Discharge Diet Orders (From admission, onward)     Start     Ordered   03/12/22 0000  Diet - low sodium heart healthy        03/12/22 1334           Cardiac and Carb modified diet DISCHARGE MEDICATION: Allergies as of 03/12/2022       Reactions   Coconut Oil    Cocos Nucifera Hives   Penicillins Hives   Bactrim [sulfamethoxazole-trimethoprim] Nausea  And Vomiting   Other Hives   Berries         Medication List     STOP taking these medications    budesonide 0.25 MG/2ML nebulizer solution Commonly known as: PULMICORT   ipratropium 0.03 % nasal spray Commonly known as: ATROVENT   ipratropium-albuterol 0.5-2.5 (3) MG/3ML Soln Commonly  known as: DUONEB       TAKE these medications    albuterol 108 (90 Base) MCG/ACT inhaler Commonly known as: VENTOLIN HFA Inhale 2 puffs into the lungs every 6 (six) hours as needed for wheezing or shortness of breath.   amitriptyline 25 MG tablet Commonly known as: ELAVIL Take 1 tablet every third night What changed:  how much to take how to take this when to take this additional instructions   amLODipine 10 MG tablet Commonly known as: NORVASC Take 1 tablet (10 mg total) by mouth daily. Start taking on: March 13, 2022 What changed: how much to take   aspirin EC 81 MG tablet Take 81 mg by mouth in the morning.   atorvastatin 20 MG tablet Commonly known as: LIPITOR Take 1 tablet (20 mg total) by mouth at bedtime.   B-D SINGLE USE SWABS REGULAR Pads USE AS DIRECTED EVERY DAY   bisoprolol 5 MG tablet Commonly known as: ZEBETA TAKE 1 TABLET EVERY DAY   docusate sodium 100 MG capsule Commonly known as: Colace Take 1 capsule (100 mg total) by mouth 2 (two) times daily.   Fish Oil 1000 MG Caps Take 2,000 mg by mouth daily.   furosemide 20 MG tablet Commonly known as: Lasix Take 1 tablet (20 mg total) by mouth daily. What changed:  how much to take how to take this when to take this additional instructions   multivitamin with minerals Tabs tablet Take 1 tablet by mouth daily. Centrum Silver   mupirocin ointment 2 % Commonly known as: BACTROBAN Apply 1 Application topically 2 (two) times daily as needed (anti-fungal bacteria).   polyethylene glycol powder 17 GM/SCOOP powder Commonly known as: GLYCOLAX/MIRALAX Take 17 g by mouth 2 (two) times daily as needed. What changed: when to take this   potassium chloride SA 20 MEQ tablet Commonly known as: KLOR-CON M Take 1 tablet (20 mEq total) by mouth daily.   PRESERVISION AREDS 2 PO Take 1 tablet by mouth in the morning and at bedtime.   PROBIOTIC PO Take 1 capsule by mouth in the morning.    senna-docusate 8.6-50 MG tablet Commonly known as: Senokot-S Take 1 tablet by mouth 2 (two) times daily.   Systane Ultra 0.4-0.3 % Soln Generic drug: Polyethyl Glycol-Propyl Glycol Place 1-2 drops into both eyes 3 (three) times daily as needed (dry/irritated eyes.).   True Metrix Blood Glucose Test test strip Generic drug: glucose blood TEST BLOOD SUGAR EVERY DAY What changed: See the new instructions.   True Metrix Meter Devi 1 each by Does not apply route daily.   TRUEplus Lancets 33G Misc TEST BLOOD SUGAR EVERY DAY   Vitamin D3 50 MCG (2000 UT) Tabs Take 2,000 Units by mouth in the morning.               Durable Medical Equipment  (From admission, onward)           Start     Ordered   03/12/22 1117  For home use only DME oxygen  Once       Question Answer Comment  Length of Need Lifetime   Mode or (Route)  Nasal cannula   Liters per Minute 3   Frequency Continuous (stationary and portable oxygen unit needed)   Oxygen conserving device Yes   Oxygen delivery system Gas      03/12/22 1117            Follow-up Information     [name], Rayford Halsted, MD Follow up in 1 week(s).   Specialty: Internal Medicine Why: Please call to schedule a follow up appointment in 1-2 weeks. Contact information: [place] [address] [phone]                Discharge Exam: Filed Weights   03/10/22 0500 03/11/22 0544 03/12/22 0500  Weight: 68.7 kg 70 kg 70.2 kg   Constitutional: Awake alert and oriented x3, no associated distress.   Respiratory:  bibasilar without any associated wheezing.  Normal respiratory effort. No accessory muscle use.  Cardiovascular: Regular rate and rhythm, no murmurs / rubs / gallops. No extremity edema. 2+ pedal pulses. No carotid bruits.  Abdomen: Abdomen is soft and nontender.  No evidence of intra-abdominal masses.  Positive bowel sounds noted in all quadrants.   Musculoskeletal: Trace distal  bilateral lower extremity pitting edema.  Much improved compared to prior examinations.  Good ROM, no contractures. Normal muscle tone.     Condition at discharge: fair  The results of significant diagnostics from this hospitalization (including imaging, microbiology, ancillary and laboratory) are listed below for reference.   Imaging Studies: US THORACENTESIS ASP PLEURAL SPACE W/IMG GUIDE  Result Date: 03/10/2022 INDICATION: Patient admitted with acute respiratory failure, new bilateral pleural effusions. Request for diagnotic and therapeutic thoracentesis EXAM: ULTRASOUND GUIDED LEFT THORACENTESIS MEDICATIONS: 7 mL 1% lidocaine COMPLICATIONS: None immediate. PROCEDURE: An ultrasound guided thoracentesis was thoroughly discussed with the patient and questions answered. The benefits, risks, alternatives and complications were also discussed. The patient understands and wishes to proceed with the procedure. Written consent was obtained. Ultrasound was performed to localize and mark an adequate pocket of fluid in the left chest. The area was then prepped and draped in the normal sterile fashion. 1% Lidocaine was used for local anesthesia. Under ultrasound guidance a 6 Fr Safe-T-Centesis catheter was introduced. Thoracentesis was performed. The catheter was removed and a dressing applied. FINDINGS: A total of approximately 350 mL of clear dark yellow fluid was removed. Samples were sent to the laboratory as requested by the clinical team. IMPRESSION: Successful ultrasound guided left thoracentesis yielding 350 mL of pleural fluid. Read by Candiss Norse, PA-C Electronically Signed   By: Corrie Mckusick D.O.   On: 03/10/2022 13:47   DG CHEST PORT 1 VIEW  Result Date: 03/10/2022 CLINICAL DATA:  Status post left thoracentesis EXAM: PORTABLE CHEST 1 VIEW COMPARISON:  03/08/2022 chest radiograph. FINDINGS: Stable cardiomediastinal silhouette with mild cardiomegaly. No pneumothorax. Small left pleural  effusion, decreased. Stable small right pleural effusion. No pulmonary edema. Patchy bibasilar lung opacities, similar. IMPRESSION: 1. No pneumothorax. Small left pleural effusion, decreased. Stable small right pleural effusion. 2. Stable patchy bibasilar lung opacities, favor atelectasis. 3. Stable mild cardiomegaly without pulmonary edema. Electronically Signed   By: Ilona Sorrel M.D.   On: 03/10/2022 13:18   ECHOCARDIOGRAM COMPLETE  Result Date: 03/10/2022    ECHOCARDIOGRAM REPORT   Patient Name:   SHANEISHA BURKEL Date of Exam: 03/10/2022 Medical Rec #:  [id number]     Height:       63.0 in Accession #:    [id number]    Weight:  151.5 lb Date of Birth:  Sep 26, 1927     BSA:          1.718 m Patient Age:    88 years      BP:           111/55 mmHg Patient Gender: F             HR:           65 bpm. Exam Location:  Inpatient Procedure: 2D Echo, Cardiac Doppler and Color Doppler Indications:    Dyspnea R06.00  History:        Patient has prior history of Echocardiogram examinations, most                 recent 12/08/2018. Risk Factors:Hypertension, Diabetes and                 Dyslipidemia.  Sonographer:    Darlina Sicilian RDCS Referring Phys: [id number] VISHAL R PATEL  Sonographer Comments: Apical PEDOF not obtained, patient going for thoracentesis. IMPRESSIONS  1. Left ventricular ejection fraction, by estimation, is 65 to 70%. The left ventricle has normal function. The left ventricle has no regional wall motion abnormalities. There is moderate concentric left ventricular hypertrophy. Diastolic function indeterminant due to severe MAC.  2. Right ventricular systolic function is normal. The right ventricular size is normal. Mildly increased right ventricular wall thickness.  3. Left atrial size was mildly dilated.  4. The mitral valve is degenerative. Trivial mitral valve regurgitation. Severe mitral annular calcification.  5. The aortic valve is tricuspid. There is severe calcifcation of the aortic valve. There  is severe thickening of the aortic valve. Aortic valve regurgitation is trivial. There is borderline mild aortic stenosis. Aortic valve mean gradient measures 8.0 mmHg. Aortic valve Vmax measures 1.86 m/s.  6. The inferior vena cava is normal in size with greater than 50% respiratory variability, suggesting right atrial pressure of 3 mmHg. Comparison(s): Compared to prior TTE in 2020, there is no signficant change. Borderline mild AS on current study. FINDINGS  Left Ventricle: Left ventricular ejection fraction, by estimation, is 65 to 70%. The left ventricle has normal function. The left ventricle has no regional wall motion abnormalities. The left ventricular internal cavity size was normal in size. There is  moderate concentric left ventricular hypertrophy. Diastolic function indeterminant due to severe MAC. Right Ventricle: The right ventricular size is normal. Mildly increased right ventricular wall thickness. Right ventricular systolic function is normal. Left Atrium: Left atrial size was mildly dilated. Right Atrium: Right atrial size was normal in size. Pericardium: There is no evidence of pericardial effusion. Mitral Valve: The mitral valve is degenerative in appearance. There is mild thickening of the mitral valve leaflet(s). There is mild calcification of the mitral valve leaflet(s). Severe mitral annular calcification. Trivial mitral valve regurgitation. MV  peak gradient, 8.8 mmHg. The mean mitral valve gradient is 2.0 mmHg. Tricuspid Valve: The tricuspid valve is normal in structure. Tricuspid valve regurgitation is trivial. Aortic Valve: The aortic valve is tricuspid. There is severe calcifcation of the aortic valve. There is severe thickening of the aortic valve. Aortic valve regurgitation is trivial. There is borderline mild aortic stenosis. Aortic valve mean gradient measures 8.0 mmHg. Aortic valve peak gradient measures 13.8 mmHg. Aortic valve area, by VTI measures 1.80 cm. Pulmonic Valve: The  pulmonic valve was normal in structure. Pulmonic valve regurgitation is trivial. Aorta: The aortic root and ascending aorta are structurally normal, with no evidence of dilitation. Venous: The inferior  vena cava is normal in size with greater than 50% respiratory variability, suggesting right atrial pressure of 3 mmHg. IAS/Shunts: The atrial septum is grossly normal.  LEFT VENTRICLE PLAX 2D LVIDd:         3.00 cm   Diastology LVIDs:         2.20 cm   LV e' medial:    2.82 cm/s LV PW:         1.30 cm   LV E/e' medial:  23.9 LV IVS:        1.80 cm   LV e' lateral:   4.74 cm/s LVOT diam:     1.90 cm   LV E/e' lateral: 14.2 LV SV:         68 LV SV Index:   39 LVOT Area:     2.84 cm  RIGHT VENTRICLE RV S prime:     8.11 cm/s TAPSE (M-mode): 1.4 cm LEFT ATRIUM             Index LA diam:        3.20 cm 1.86 cm/m LA Vol (A2C):   31.6 ml 18.39 ml/m LA Vol (A4C):   31.7 ml 18.45 ml/m LA Biplane Vol: 33.2 ml 19.32 ml/m  AORTIC VALVE AV Area (Vmax):    1.83 cm AV Area (Vmean):   1.76 cm AV Area (VTI):     1.80 cm AV Vmax:           186.00 cm/s AV Vmean:          125.667 cm/s AV VTI:            0.376 m AV Peak Grad:      13.8 mmHg AV Mean Grad:      8.0 mmHg LVOT Vmax:         120.00 cm/s LVOT Vmean:        78.100 cm/s LVOT VTI:          0.239 m LVOT/AV VTI ratio: 0.64  AORTA Ao Root diam: 3.00 cm Ao Asc diam:  2.80 cm MITRAL VALVE MV Area (PHT): 1.89 cm     SHUNTS MV Area VTI:   1.61 cm     Systemic VTI:  0.24 m MV Peak grad:  8.8 mmHg     Systemic Diam: 1.90 cm MV Mean grad:  2.0 mmHg MV Vmax:       1.48 m/s MV Vmean:      71.4 cm/s MV Decel Time: 402 msec MV E velocity: 67.30 cm/s MV A velocity: 127.00 cm/s MV E/A ratio:  0.53 Gwyndolyn Kaufman MD Electronically signed by Gwyndolyn Kaufman MD Signature Date/Time: 03/10/2022/12:53:23 PM    Final    CT Angio Chest PE W and/or Wo Contrast  Result Date: 03/08/2022 CLINICAL DATA:  High probability for pulmonary embolism. EXAM: CT ANGIOGRAPHY CHEST WITH CONTRAST  TECHNIQUE: Multidetector CT imaging of the chest was performed using the standard protocol during bolus administration of intravenous contrast. Multiplanar CT image reconstructions and MIPs were obtained to evaluate the vascular anatomy. RADIATION DOSE REDUCTION: This exam was performed according to the departmental dose-optimization program which includes automated exposure control, adjustment of the mA and/or kV according to patient size and/or use of iterative reconstruction technique. CONTRAST:  89m OMNIPAQUE IOHEXOL 350 MG/ML SOLN COMPARISON:  Chest x-ray same day.  CT angiogram chest 09/05/2021. FINDINGS: Cardiovascular: Heart is mildly enlarged. Aorta is normal in size. There are atherosclerotic calcifications of the aorta. There is no pericardial effusion. There is adequate  opacification of the pulmonary arteries to the segmental level. There is no evidence for pulmonary embolism. Mediastinum/Nodes: No enlarged mediastinal, hilar, or axillary lymph nodes. Thyroid gland, trachea, and esophagus demonstrate no significant findings. There is a small hiatal hernia. Lungs/Pleura: There are moderate-sized bilateral pleural effusions. 9 mm ground-glass nodular density in the right upper lobe appears unchanged from prior. There is atelectasis in the bilateral lower lobes and lingula, left greater than right. No pneumothorax. Trachea and central airways are patent. Upper Abdomen: No acute abnormality. Musculoskeletal: Multilevel degenerative changes affect the spine. Review of the MIP images confirms the above findings. IMPRESSION: 1. No evidence for pulmonary embolism. 2. Moderate-sized bilateral pleural effusions. 3. 9 mm ground-glass nodular density in the right upper lobe (stable for 6 months). Continued surveillance recommended with Chest CTs every 2 years until 5 years. If nodule grows or develops solid component(s), consider resection. These guidelines do not apply to immunocompromised patients and patients  with cancer. Follow up in patients with significant comorbidities as clinically warranted. For lung cancer screening, adhere to Lung-RADS guidelines. Reference: Radiology. 2017; 284(1):228-43. 4. Mild cardiomegaly. Aortic Atherosclerosis (ICD10-I70.0). Electronically Signed   By: Ronney Asters M.D.   On: 03/08/2022 22:48   CT Head Wo Contrast  Result Date: 03/08/2022 CLINICAL DATA:  Hypertension, headaches, neurological deficit EXAM: CT HEAD WITHOUT CONTRAST TECHNIQUE: Contiguous axial images were obtained from the base of the skull through the vertex without intravenous contrast. RADIATION DOSE REDUCTION: This exam was performed according to the departmental dose-optimization program which includes automated exposure control, adjustment of the mA and/or kV according to patient size and/or use of iterative reconstruction technique. COMPARISON:  01/24/2019 FINDINGS: Brain: No acute intracranial findings are seen. There are no signs of bleeding within the cranium. Cortical sulci are prominent. Few old lacunar infarcts are seen in basal ganglia. There is decreased density in periventricular white matter. Vascular: Unremarkable. Skull: No acute findings are seen. Sinuses/Orbits: Possible small osteoma is seen in left ethmoid sinus. Other: None. IMPRESSION: No acute intracranial findings are seen in noncontrast CT brain. Atrophy. Old lacunar infarct is seen in basal ganglia. Small-vessel disease. Electronically Signed   By: Elmer Picker M.D.   On: 03/08/2022 16:26   DG Chest 2 View  Result Date: 03/08/2022 CLINICAL DATA:  Shortness of breath EXAM: CHEST - 2 VIEW COMPARISON:  09/04/2021 FINDINGS: Cardiac size is within normal limits. There is interval appearance of small to moderate bilateral pleural effusions, more so on the left side. Pleural effusions limit evaluation of lower lung fields for infiltrates. There are no signs of alveolar pulmonary edema. There is no pneumothorax. Increase in AP diameter  of chest suggests COPD. Osteopenia is seen in bony structures. IMPRESSION: There is interval appearance of small to moderate bilateral pleural effusions, more so on the left side. There are no signs of alveolar pulmonary edema. Evaluation of lower lung fields for infiltrates is limited by the effusions. Rest of the lung fields appear clear. Electronically Signed   By: Elmer Picker M.D.   On: 03/08/2022 16:23    Microbiology: Results for orders placed or performed during the hospital encounter of 03/08/22  Resp Panel by RT-PCR (Flu A&B, Covid) Anterior Nasal Swab     Status: None   Collection Time: 03/08/22  4:58 PM   Specimen: Anterior Nasal Swab  Result Value Ref Range Status   SARS Coronavirus 2 by RT PCR NEGATIVE NEGATIVE Final    Comment: (NOTE) SARS-CoV-2 target nucleic acids are NOT DETECTED.  The  SARS-CoV-2 RNA is generally detectable in upper respiratory specimens during the acute phase of infection. The lowest concentration of SARS-CoV-2 viral copies this assay can detect is 138 copies/mL. A negative result does not preclude SARS-Cov-2 infection and should not be used as the sole basis for treatment or other patient management decisions. A negative result may occur with  improper specimen collection/handling, submission of specimen other than nasopharyngeal swab, presence of viral mutation(s) within the areas targeted by this assay, and inadequate number of viral copies(<138 copies/mL). A negative result must be combined with clinical observations, patient history, and epidemiological information. The expected result is Negative.  Fact Sheet for Patients:  EntrepreneurPulse.com.au  Fact Sheet for Healthcare Providers:  IncredibleEmployment.be  This test is no t yet approved or cleared by the [place] FDA and  has been authorized for detection and/or diagnosis of SARS-CoV-2 by FDA under an Emergency Use Authorization (EUA). This  EUA will remain  in effect (meaning this test can be used) for the duration of the COVID-19 declaration under Section 564(b)(1) of the Act, 21 U.S.C.section 360bbb-3(b)(1), unless the authorization is terminated  or revoked sooner.       Influenza A by PCR NEGATIVE NEGATIVE Final   Influenza B by PCR NEGATIVE NEGATIVE Final    Comment: (NOTE) The Xpert Xpress SARS-CoV-2/FLU/RSV plus assay is intended as an aid in the diagnosis of influenza from Nasopharyngeal swab specimens and should not be used as a sole basis for treatment. Nasal washings and aspirates are unacceptable for Xpert Xpress SARS-CoV-2/FLU/RSV testing.  Fact Sheet for Patients: EntrepreneurPulse.com.au  Fact Sheet for Healthcare Providers: IncredibleEmployment.be  This test is not yet approved or cleared by the [place] FDA and has been authorized for detection and/or diagnosis of SARS-CoV-2 by FDA under an Emergency Use Authorization (EUA). This EUA will remain in effect (meaning this test can be used) for the duration of the COVID-19 declaration under Section 564(b)(1) of the Act, 21 U.S.C. section 360bbb-3(b)(1), unless the authorization is terminated or revoked.  Performed at [facility], [place] [address], [place], [place] [address]   Urine Culture     Status: Abnormal   Collection Time: 03/08/22  8:45 PM   Specimen: Urine, Clean Catch  Result Value Ref Range Status   Specimen Description   Final    URINE, CLEAN CATCH Performed at [facility], [place] [address], [place], [place] [address]    Special Requests   Final    NONE Performed at [facility], [place] [address], [place], [place] [address]    Culture MULTIPLE SPECIES PRESENT, SUGGEST RECOLLECTION (A)  Final   Report Status 03/10/2022 FINAL  Final  Body fluid culture w Gram Stain     Status: None (Preliminary result)   Collection Time: 03/10/22 12:43 PM    Specimen: PATH Cytology Pleural fluid  Result Value Ref Range Status   Specimen Description   Final    PLEURAL Performed at [place] [address], [place], [place] [address]    Special Requests   Final    NONE Performed at [facility], [place] [address] [place],  [address]    Gram Stain   Final    RARE WBC PRESENT, PREDOMINANTLY MONONUCLEAR NO ORGANISMS SEEN    Culture   Final    NO GROWTH 2 DAYS Performed at [facility], [place] [address] [place],  [address]    Report Status PENDING  Incomplete  Labs: CBC: Recent Labs  Lab 03/08/22 1630 03/09/22 0620  WBC 7.0 9.5  NEUTROABS 4.6  --   HGB 11.6* 11.0*  HCT 37.1 34.5*  MCV 87.5 86.5  PLT 292 009   Basic Metabolic Panel: Recent Labs  Lab 03/08/22 1630 03/09/22 0620 03/10/22 0357 03/11/22 0449 03/12/22 0515  NA 139 139 138 134* 136  K 3.5 4.3 4.1 4.3 4.2  CL 106 104 105 102 102  CO2 _0 GLUCOSE 146* 160* 145* 111* 101*  BUN _1 24* 24*  CREATININE 1.19* 1.32* 1.38* 1.60* 1.50*  CALCIUM 8.9 8.7* 8.7* 8.6* 8.6*  MG  --  2.2 2.1  --   --    Liver Function Tests: Recent Labs  Lab 03/08/22 1630 03/10/22 0357  AST 22  --   ALT 23  --   ALKPHOS 114  --   BILITOT 0.7  --   PROT 6.0* 5.5*  ALBUMIN 3.0*  --    CBG: Recent Labs  Lab 03/11/22 1136 03/11/22 1608 03/11/22 2001 03/12/22 0735 03/12/22 1144  GLUCAP 111* 90 119* 89 98    Discharge time spent: greater than 30 minutes.  Signed: Vernelle Emerald, MD [facility] 03/12/2022

## 2022-03-12 NOTE — TOC Transition Note (Addendum)
Transition of Care [facility]) - CM/SW Discharge Note   Patient Details  Name: Stacey Armstrong MRN: [id number] Date of Birth: 02-10-1928  Transition of Care [facility]) CM/SW Contact:  Leeroy Cha, RN Phone Number: 03/12/2022, 1:44 PM   Clinical Narrative:    Orders for [facility]hc sent to [facility] at 1340. Unable to staff. [facility] at [phone] [facility].  Final next level of care: [place] Barriers to Discharge: Barriers Resolved   Patient Goals and CMS Choice Patient states their goals for this hospitalization and ongoing recovery are:: to return home CMS Medicare.gov Compare Post Acute Care list provided to:: Patient Choice offered to / list presented to : Patient  Discharge Placement                       Discharge Plan and Services   Discharge Planning Services: CM Consult Post Acute Care Choice: Durable Medical Equipment          DME Arranged: Oxygen DME Agency: [employer] Date DME Agency Contacted: 03/12/22 Time DME Agency Contacted: [id number] Representative spoke with at DME Agency: zack blank AND DANIELLE            Social Determinants of Health ([place]) Interventions     Readmission Risk Interventions   No data to display

## 2022-03-12 NOTE — TOC Transition Note (Signed)
Transition of Care [facility]) - CM/SW Discharge Note   Patient Details  Name: Stacey Armstrong MRN: [id number] Date of Birth: [birth year]  Transition of Care [facility]) CM/SW Contact:  Leeroy Cha, RN Phone Number: 03/12/2022, 10:42 AM   Clinical Narrative:     Patient discharged to return home with self care.  Final next level of care: Home/Self Care Barriers to Discharge: Barriers Resolved   Patient Goals and CMS Choice Patient states their goals for this hospitalization and ongoing recovery are:: to return home CMS Medicare.gov Compare Post Acute Care list provided to:: Patient    Discharge Placement                       Discharge Plan and Services   Discharge Planning Services: CM Consult                                 Social Determinants of Health (SDOH) Interventions     Readmission Risk Interventions   No data to display

## 2022-03-12 NOTE — Telephone Encounter (Signed)
Daughter is aware 

## 2022-03-13 ENCOUNTER — Telehealth: Payer: Self-pay

## 2022-03-13 NOTE — Telephone Encounter (Signed)
Transition Care Management Follow-up Telephone Call Date of discharge and from where: [facility] 03/12/2022 How have you been since you were released from the hospital? tired Any questions or concerns? No  Items Reviewed: Did the pt receive and understand the discharge instructions provided? Yes  Medications obtained and verified? Yes  Other? No  Any new allergies since your discharge? No  Dietary orders reviewed? Yes Do you have support at home? Yes   Home Care and Equipment/Supplies: Were home health services ordered? no If so, what is the name of the agency?n/a Has the agency set up a time to come to the patient's home? no Were any new equipment or medical supplies ordered?  No What is the name of the medical supply agency? N/a Were you able to get the supplies/equipment? not applicable Do you have any questions related to the use of the equipment or supplies? No  Functional Questionnaire: (I = Independent and D = Dependent) ADLs: I  Bathing/Dressing- I  Meal Prep- I  Eating- I  Maintaining continence- I  Transferring/Ambulation- I  Managing Meds- I  Follow up appointments reviewed:  PCP Hospital f/u appt confirmed? Yes  Scheduled to see  on Dr Doretha Imus @ 03/18/2022$RemoveBeforeDE'@[id number]$ :00. [facility] f/u appt confirmed? No   Are transportation arrangements needed? No  If their condition worsens, is the pt aware to call PCP or go to the Emergency Dept.? Yes Was the patient provided with contact information for the PCP's office or ED? Yes Was to pt encouraged to call back with questions or concerns? Yes Juanda Crumble, LPN [place] Direct Dial [phone]

## 2022-03-14 LAB — BODY FLUID CULTURE W GRAM STAIN: Culture: NO GROWTH

## 2022-03-17 ENCOUNTER — Telehealth: Payer: Self-pay | Admitting: *Deleted

## 2022-03-17 NOTE — Chronic Care Management (AMB) (Unsigned)
  Care Coordination  Outreach Note  03/17/2022 Name: Melvine Julin MRN: [id number] DOB: May 22, 1928   Care Coordination Outreach Attempts: An unsuccessful telephone outreach was attempted today to offer the patient information about available care coordination services as a benefit of their health plan.   Follow Up Plan:  Additional outreach attempts will be made to offer the patient care coordination information and services.   Encounter Outcome:  No Answer  Julian Hy, [place] Direct Dial: [phone]

## 2022-03-18 ENCOUNTER — Inpatient Hospital Stay: Payer: Medicare HMO | Admitting: Internal Medicine

## 2022-03-19 NOTE — Chronic Care Management (AMB) (Signed)
  Care Coordination  Outreach Note  03/19/2022 Name: Stacey Armstrong MRN: [id number] DOB: [birth year]   Care Coordination Outreach Attempts: A second unsuccessful outreach was attempted today to offer the patient with information about available care coordination services as a benefit of their health plan.     Referral received   Follow Up Plan:  Additional outreach attempts will be made to offer the patient care coordination information and services.   Encounter Outcome:  No Answer  Julian Hy, [place] Direct Dial: [phone]

## 2022-03-19 NOTE — Chronic Care Management (AMB) (Signed)
  Care Coordination   Note   03/19/2022 Name: Stacey Armstrong MRN: [id number] DOB: 1927/09/03  Stacey Armstrong is a 88 y.o. year old female who sees [name], Rayford Halsted, MD for primary care. I reached out to Harshitha Fretz by phone today to offer care coordination services.  Ms. Zipp was given information about Care Coordination services today including:   The Care Coordination services include support from the care team which includes your Nurse Coordinator, Clinical Social Worker, or Pharmacist.  The Care Coordination team is here to help remove barriers to the health concerns and goals most important to you. Care Coordination services are voluntary, and the patient may decline or stop services at any time by request to their care team member.   Care Coordination Consent Status: Patient agreed to services and verbal consent obtained.   Follow up plan:  Telephone appointment with care coordination team member scheduled for:  03/20/2022  Encounter Outcome:  Pt. Scheduled from referral   Julian Hy, [place] Direct Dial: [phone]

## 2022-03-20 ENCOUNTER — Encounter: Payer: Self-pay | Admitting: *Deleted

## 2022-03-20 ENCOUNTER — Ambulatory Visit: Payer: Self-pay | Admitting: *Deleted

## 2022-03-20 NOTE — Patient Outreach (Signed)
  Care Coordination   Initial Visit Note   03/20/2022 Name: Stacey Armstrong MRN: [id number] DOB: 1927/08/20  Stacey Armstrong is a 88 y.o. year old female who sees [name], Rayford Halsted, MD for primary care. I spoke with  Joselyn Glassman  and her DPR daughter Stacey Armstrong by phone today.  What matters to the patients health and wellness today?  Managing her blood pressure and getting the O2 tank removed from her home.    Goals Addressed               This Visit's Progress     "Blood pressure is alittle high"/Removal of O2 system (pt-stated)        Care Coordination Interventions:  Pt currently living in [facility] independent living facility and utilizes the PT personnel within the facility. Evaluation of current treatment plan related to hypertension self management and patient's adherence to plan as established by provider Provided education to patient re: stroke prevention, s/s of heart attack and stroke Reviewed medications with patient and discussed importance of compliance Discussed plans with patient for ongoing care management follow up and provided patient with direct contact information for care management team Advised patient, providing education and rationale, to monitor blood pressure daily and record, calling PCP for findings outside established parameters Reviewed scheduled/upcoming provider appointments including:  Discussed complications of poorly controlled blood pressure such as heart disease, stroke, circulatory complications, vision complications, kidney impairment, sexual dysfunction Screening for signs and symptoms of depression related to chronic disease state  Assessed social determinant of health barriers Verified provider has increased pt's blood pressure medications to improve her elevated readings. Also verified pt and daughter is aware on how to recognize high readings and what to do. Educated on signs and symptoms and when to contact her provider or seek immediate  medical care.  Encouraged pt to document all readings and avoid stressors that can influence her blood pressures. Pt reports today's reading at 164/68 and pt remains asymptomatic. Will add to AVS education on HTN and stroke prevention based upon pt's hx. O2 system: Verified DME is from [facility]. RN stress the importance of utilizing her home O2 however daughter reports pt has been stable with her pulse-ox reads at 92-94% with no SOB or respiratory distress and the device takes up a lot of space in the pt's living area. Informed the daughter that the number is on the concentrator for [employer] however pt should make her provider aware prior to removing the tank from the home. States pt was not really using the oxygen. Hospital follow up visit was rescheduled for next week.          SDOH assessments and interventions completed:  Yes  SDOH Interventions Today    Flowsheet Row Most Recent Value  SDOH Interventions   Food Insecurity Interventions Intervention Not Indicated  Housing Interventions Intervention Not Indicated  Transportation Interventions Intervention Not Indicated  Utilities Interventions Intervention Not Indicated        Care Coordination Interventions Activated:  Yes  Care Coordination Interventions:  Yes, provided   Follow up plan: Follow up call scheduled for 04/24/2022 @ 4:00 PM    Encounter Outcome:  Pt. Visit Completed   Raina Mina, RN Care Management Coordinator [place] Office [phone]

## 2022-03-20 NOTE — Patient Instructions (Signed)
Visit Information  Thank you for taking time to visit with me today. Please don't hesitate to contact me if I can be of assistance to you.   Following are the goals we discussed today:   Goals Addressed               This Visit's Progress     "Blood pressure is alittle high"/Removal of O2 system (pt-stated)        Care Coordination Interventions:  Pt currently living in [facility] independent living facility and utilizes the PT personnel within the facility. Evaluation of current treatment plan related to hypertension self management and patient's adherence to plan as established by provider Provided education to patient re: stroke prevention, s/s of heart attack and stroke Reviewed medications with patient and discussed importance of compliance Discussed plans with patient for ongoing care management follow up and provided patient with direct contact information for care management team Advised patient, providing education and rationale, to monitor blood pressure daily and record, calling PCP for findings outside established parameters Reviewed scheduled/upcoming provider appointments including:  Discussed complications of poorly controlled blood pressure such as heart disease, stroke, circulatory complications, vision complications, kidney impairment, sexual dysfunction Screening for signs and symptoms of depression related to chronic disease state  Assessed social determinant of health barriers Verified provider has increased pt's blood pressure medications to improve her elevated readings. Also verified pt and daughter is aware on how to recognize high readings and what to do. Educated on signs and symptoms and when to contact her provider or seek immediate medical care.  Encouraged pt to document all readings and avoid stressors that can influence her blood pressures. Pt reports today's reading at 164/68 and pt remains asymptomatic. Will add to AVS education on HTN and stroke prevention  based upon pt's hx. O2 system: Verified DME is from [facility]. RN stress the importance of utilizing her home O2 however daughter reports pt has been stable with her pulse-ox reads at 92-94% with no SOB or respiratory distress and the device takes up a lot of space in the pt's living area. Informed the daughter that the number is on the concentrator for [employer] however pt should make her provider aware prior to removing the tank from the home. States pt was not really using the oxygen. Hospital follow up visit was rescheduled for next week.          Our next appointment is by telephone on 04/24/2022 at 4:00 PM.  Please call the care guide team at [phone] if you need to cancel or reschedule your appointment.   If you are experiencing a Mental Health or [place] or need someone to talk to, please call the Suicide and Crisis Lifeline: 988  Patient verbalizes understanding of instructions and care plan provided today and agrees to view in [place]. Active MyChart status and patient understanding of how to access instructions and care plan via MyChart confirmed with patient.     Raina Mina, RN Care Management Coordinator [place] Office [phone]

## 2022-03-27 ENCOUNTER — Ambulatory Visit (INDEPENDENT_AMBULATORY_CARE_PROVIDER_SITE_OTHER): Payer: Medicare HMO

## 2022-03-27 ENCOUNTER — Ambulatory Visit (INDEPENDENT_AMBULATORY_CARE_PROVIDER_SITE_OTHER): Payer: Medicare HMO | Admitting: Internal Medicine

## 2022-03-27 ENCOUNTER — Encounter: Payer: Self-pay | Admitting: Internal Medicine

## 2022-03-27 VITALS — BP 165/60 | HR 59 | Temp 97.7°F | Wt 154.1 lb

## 2022-03-27 DIAGNOSIS — I5033 Acute on chronic diastolic (congestive) heart failure: Secondary | ICD-10-CM

## 2022-03-27 DIAGNOSIS — I160 Hypertensive urgency: Secondary | ICD-10-CM | POA: Diagnosis not present

## 2022-03-27 DIAGNOSIS — R109 Unspecified abdominal pain: Secondary | ICD-10-CM | POA: Diagnosis not present

## 2022-03-27 DIAGNOSIS — K5641 Fecal impaction: Secondary | ICD-10-CM

## 2022-03-27 DIAGNOSIS — L03115 Cellulitis of right lower limb: Secondary | ICD-10-CM

## 2022-03-27 DIAGNOSIS — Z09 Encounter for follow-up examination after completed treatment for conditions other than malignant neoplasm: Secondary | ICD-10-CM

## 2022-03-27 MED ORDER — ACCU-CHEK FASTCLIX LANCETS MISC: 1.0000 | Freq: Every day | 2 refills | Status: DC

## 2022-03-27 MED ORDER — DOXYCYCLINE HYCLATE 100 MG PO TABS: 100.0000 mg | ORAL_TABLET | Freq: Two times a day (BID) | ORAL | 0 refills | Status: AC

## 2022-03-27 NOTE — Progress Notes (Signed)
Established Patient Office Visit     CC/Reason for Visit: Hospital follow-up, leg pain  HPI: Korissa Horsford is a 88 y.o. female who is coming in today for the above mentioned reasons. Past Medical History is significant for: Diabetes, hypertension and hyperlipidemia.  She also has had nephrolithiasis with stent placement in the recent past.  She was hospitalized from 03/08/2022-03/12/2022 for acute respiratory failure due to flash pulmonary edema related to hypertensive urgency as well as acute on chronic heart failure with preserved ejection fraction.  She had pleural effusion and required a left-sided thoracentesis.  Shortness of breath has improved.  Oxygen saturation has stayed above 94%, she wants her oxygen removed from the house.  Over the past few days she has noted the appearance of a swollen area with an erythematous patch over her right ankle.  This all emanates from a small open wound on her anterior leg.  She has had issues with constipation and fecal impaction in the past.  They are wanting an abdominal film to check stool burden to help guide amount of MiraLAX that needs to be taken.   Past Medical/Surgical History: Past Medical History:  Diagnosis Date   Arthritis    Complication of anesthesia    after gallbladder surgery with extubation had shallow respirations,  needed to continue intudation for additonal 10-12 days   Diabetes ([place])    DVT (deep venous thrombosis) (HCC)    left leg after hysterectomy   GERD (gastroesophageal reflux disease)    History of COVID-19 05/2021   History of kidney stones    History of transient ischemic attack (TIA)    HOH (hard of hearing)    Hyperlipidemia    Hypertension    Sleep apnea     Past Surgical History:  Procedure Laterality Date   ABDOMINAL HYSTERECTOMY     APPENDECTOMY     CATARACT EXTRACTION, BILATERAL     CHOLECYSTECTOMY     COLONOSCOPY     CYSTOSCOPY WITH STENT PLACEMENT Right 08/14/2021   Procedure: CYSTOSCOPY  WITH STENT PLACEMENT;  Surgeon: Ardis Hughs, MD;  Location: [facility];  Service: Urology;  Laterality: Right;  ONLY NEEDS 30 MIN   EXTRACORPOREAL SHOCK WAVE LITHOTRIPSY Right 02/06/2022   Procedure: RIGHT EXTRACORPOREAL SHOCK WAVE LITHOTRIPSY (ESWL);  Surgeon: Ardis Hughs, MD;  Location: [facility];  Service: Urology;  Laterality: Right;    Social History:  reports that she has quit smoking. She has never used smokeless tobacco. She reports that she does not currently use alcohol. She reports that she does not use drugs.  Allergies: Allergies  Allergen Reactions   Coconut Oil    Cocos Nucifera Hives   Penicillins Hives   Bactrim [Sulfamethoxazole-Trimethoprim] Nausea And Vomiting   Other Hives    Berries     Family History:  Family History  Problem Relation Age of Onset   CAD Maternal Grandmother    Pneumonia Maternal Grandfather      Current Outpatient Medications:    Accu-Chek FastClix Lancets MISC, 1 each by Does not apply route daily., Disp: 102 each, Rfl: 2   albuterol (VENTOLIN HFA) 108 (90 Base) MCG/ACT inhaler, Inhale 2 puffs into the lungs every 6 (six) hours as needed for wheezing or shortness of breath., Disp: 8 g, Rfl: 2   Alcohol Swabs (B-D SINGLE USE SWABS REGULAR) PADS, USE AS DIRECTED EVERY DAY, Disp: 100 each, Rfl: 0   amitriptyline (ELAVIL) 25 MG tablet, Take 1 tablet every third  night (Patient taking differently: Take 25 mg by mouth See admin instructions. Take 25mg  by mouth every fourth night), Disp: 45 tablet, Rfl: 1   amLODipine (NORVASC) 10 MG tablet, Take 1 tablet (10 mg total) by mouth daily., Disp: 30 tablet, Rfl: 2   aspirin EC 81 MG tablet, Take 81 mg by mouth in the morning., Disp: , Rfl:    atorvastatin (LIPITOR) 20 MG tablet, Take 1 tablet (20 mg total) by mouth at bedtime., Disp: 90 tablet, Rfl: 0   bisoprolol (ZEBETA) 5 MG tablet, TAKE 1 TABLET EVERY DAY, Disp: 90 tablet, Rfl: 1   Blood Glucose Monitoring Suppl (TRUE  METRIX METER) DEVI, 1 each by Does not apply route daily., Disp: 1 each, Rfl: 1   Cholecalciferol (VITAMIN D3) 50 MCG (2000 UT) TABS, Take 2,000 Units by mouth in the morning., Disp: , Rfl:    doxycycline (VIBRA-TABS) 100 MG tablet, Take 1 tablet (100 mg total) by mouth 2 (two) times daily for 10 days., Disp: 20 tablet, Rfl: 0   furosemide (LASIX) 20 MG tablet, Take 1 tablet (20 mg total) by mouth daily., Disp: 30 tablet, Rfl: 2   Multiple Vitamin (MULTIVITAMIN WITH MINERALS) TABS tablet, Take 1 tablet by mouth daily. Centrum Silver, Disp: , Rfl:    Multiple Vitamins-Minerals (PRESERVISION AREDS 2 PO), Take 1 tablet by mouth in the morning and at bedtime., Disp: , Rfl:    mupirocin ointment (BACTROBAN) 2 %, Apply 1 Application topically 2 (two) times daily as needed (anti-fungal bacteria)., Disp: , Rfl:    Omega-3 Fatty Acids (FISH OIL) 1000 MG CAPS, Take 2,000 mg by mouth daily., Disp: , Rfl:    Polyethyl Glycol-Propyl Glycol (SYSTANE ULTRA) 0.4-0.3 % SOLN, Place 1-2 drops into both eyes 3 (three) times daily as needed (dry/irritated eyes.)., Disp: , Rfl:    polyethylene glycol powder (GLYCOLAX/MIRALAX) 17 GM/SCOOP powder, Take 17 g by mouth 2 (two) times daily as needed. (Patient taking differently: Take 17 g by mouth in the morning and at bedtime.), Disp: 3350 g, Rfl: 1   potassium chloride SA (KLOR-CON M) 20 MEQ tablet, Take 1 tablet (20 mEq total) by mouth daily., Disp: 30 tablet, Rfl: 2   Probiotic Product (PROBIOTIC PO), Take 1 capsule by mouth in the morning., Disp: , Rfl:    senna-docusate (SENOKOT-S) 8.6-50 MG tablet, Take 1 tablet by mouth 2 (two) times daily., Disp: 60 tablet, Rfl: 2   TRUE METRIX BLOOD GLUCOSE TEST test strip, TEST BLOOD SUGAR EVERY DAY, Disp: 100 strip, Rfl: 10  Review of Systems:  Constitutional: Denies fever, chills, diaphoresis.  HEENT: Denies photophobia, eye pain, redness, hearing loss, ear pain, congestion, sore throat, rhinorrhea, sneezing, mouth sores, trouble  swallowing, neck pain, neck stiffness and tinnitus.   Respiratory: Denies SOB, DOE, cough, chest tightness,  and wheezing.   Cardiovascular: Denies chest pain, palpitations and leg swelling.  Gastrointestinal: Denies nausea, vomiting, abdominal pain, diarrhea, constipation, blood in stool and abdominal distention.  Genitourinary: Denies dysuria, urgency, frequency, hematuria, flank pain and difficulty urinating.  Endocrine: Denies: hot or cold intolerance, sweats, changes in hair or nails, polyuria, polydipsia. Skin: Denies pallor, rash. Neurological: Denies dizziness, seizures, syncope, weakness, light-headedness, numbness and headaches.  Hematological: Denies adenopathy. Easy bruising, personal or family bleeding history  Psychiatric/Behavioral: Denies suicidal ideation, mood changes, confusion, nervousness, sleep disturbance and agitation    Physical Exam: Vitals:   03/27/22 1536 03/27/22 1540  BP: (!) 150/70 (!) 165/60  Pulse: (!) 59   Temp: 97.7 F (36.5  C)   TempSrc: Oral   SpO2: 99%   Weight: 154 lb 1.6 oz (69.9 kg)     Body mass index is 27.3 kg/m.   Constitutional: NAD, calm, comfortable Eyes: PERRL, lids and conjunctivae normal, wears corrective lenses ENMT: Mucous membranes are moist.  Very hard of hearing. Respiratory: clear to auscultation bilaterally, no wheezing, no crackles. Normal respiratory effort. No accessory muscle use.  Cardiovascular: Regular rate and rhythm, no murmurs / rubs / gallops.  Skin: Small epidermal defect over anterior right lower leg with surrounding erythema and edema. Psychiatric: Normal judgment and insight. Alert and oriented x 3. Normal mood.    Impression and Plan:  Hospital discharge follow-up  Hypertensive urgency  Acute on chronic diastolic CHF (congestive heart failure) (HCC)  Fecal impaction (HCC) - Plan: DG Abd 2 Views  Cellulitis of right lower extremity - Plan: doxycycline (VIBRA-TABS) 100 MG tablet  -I believe she  has a right lower extremity cellulitis, I will send a prescription for doxycycline for her to take twice a day for 10 days. -I agree with abdominal film to rule out fecal impaction given her history and constipation. -Blood pressure remains elevated but amlodipine dose was recently increased during her hospital stay, no further changes today. -Her volume status is currently stable.  Continue furosemide daily 20 mg.  Time spent:34 minutes reviewing chart, interviewing and examining patient and formulating plan of care.      Lelon Frohlich, MD [facility]

## 2022-04-01 ENCOUNTER — Telehealth: Payer: Self-pay | Admitting: Internal Medicine

## 2022-04-01 DIAGNOSIS — J9600 Acute respiratory failure, unspecified whether with hypoxia or hypercapnia: Secondary | ICD-10-CM

## 2022-04-01 NOTE — Telephone Encounter (Signed)
Pt called, returning CMA's call. CMA was unavailable. Pt asked that CMA call back at their earliest convenience.

## 2022-04-01 NOTE — Telephone Encounter (Signed)
Patient is aware of xray results. Patient states that she need a note to discontinue her oxygen. Okay to give note?

## 2022-04-01 NOTE — Telephone Encounter (Signed)
Pt's daughter Nevin Bloodgood called to ask MD to please call:  [place] [phone]  Daughter stated they need a Doctor's Note to cancel the oxygen equipment.  Previous message from [place] was read to daughter.

## 2022-04-01 NOTE — Telephone Encounter (Signed)
Order was placed to d/c oxygen

## 2022-04-04 ENCOUNTER — Other Ambulatory Visit: Payer: Self-pay | Admitting: Internal Medicine

## 2022-04-04 NOTE — Telephone Encounter (Signed)
Patient's daughter called back regarding oxygen equipment. Doctor's note needs to go to intake department by fax   Fax number is  [phone]  She states that until they have the note from the doctor they will continue to charge her mother week by week for the equipment     Daughter is aware that Dr.Hernandez and Apolonio Schneiders are out of the office today (11/10) and will be back in Monday.       Please advise

## 2022-04-08 NOTE — Telephone Encounter (Signed)
[employer] has received the order.

## 2022-04-24 ENCOUNTER — Ambulatory Visit: Payer: Self-pay | Admitting: *Deleted

## 2022-04-24 ENCOUNTER — Encounter: Payer: Self-pay | Admitting: *Deleted

## 2022-04-24 NOTE — Patient Outreach (Signed)
  Care Coordination   Follow Up Visit Note   04/24/2022 Name: Stacey Armstrong MRN: [id number] DOB: [birth year]  Stacey Armstrong is a 88 y.o. year old female who sees [name], Rayford Halsted, MD for primary care. I spoke with  Joselyn Glassman by phone today.  What matters to the patients health and wellness today?  No further needs today    Goals Addressed               This Visit's Progress     COMPLETED: "Blood pressure is alittle high"/Removal of O2 system (pt-stated)        Care Coordination Interventions:  Pt currently living in [facility] facility and utilizes the PT personnel within the facility. Pt reports the Home O2 system has been removed from the home and she continue to breath with no distress. RN will continue to encouraged pt to use her pulse Ox to check her sats when needed. Pt reports her bp taken by wrist at 174-190/66-73 with the last provider office read at 165/60 and pt remains asymptomatic. Strongly encouraged pt to alert her daughter/provider with any abnormal symptoms related to bp or her CHF to avoid readmissions or ED visits (pt verbalized an understanding). Offered arm cuff however declined due to pt's excess skin-folds due to weight loss that irritates pt's skin area.  Verified pt continues to have sufficient transportation to medical appointments. Verified pt adherent with taking all her prescribed medications with no needed refills.  No additional needs at this time as pt and caregiver daughter Nevin Bloodgood are aware on who to contact this RN case manager for any additional needs or resources. Case will be closed.          SDOH assessments and interventions completed:  Yes  SDOH Interventions Today    Flowsheet Row Most Recent Value  SDOH Interventions   Transportation Interventions Intervention Not Indicated        Care Coordination Interventions:  Yes, provided   Follow up plan: No further intervention required.   Encounter Outcome:   Pt. Visit Completed   Raina Mina, RN Care Management Coordinator [place] Office [phone]

## 2022-04-24 NOTE — Patient Instructions (Signed)
Visit Information  Thank you for taking time to visit with me today. Please don't hesitate to contact me if I can be of assistance to you.   Following are the goals we discussed today:   Goals Addressed               This Visit's Progress     COMPLETED: "Blood pressure is alittle high"/Removal of O2 system (pt-stated)        Care Coordination Interventions:  Pt currently living in [facility] independent living facility and utilizes the PT personnel within the facility. Pt reports the Home O2 system has been removed from the home and she continue to breath with no distress. RN will continue to encouraged pt to use her pulse Ox to check her sats when needed. Pt reports her bp taken by wrist at 174-190/66-73 with the last provider office read at 165/60 and pt remains asymptomatic. Strongly encouraged pt to alert her daughter/provider with any abnormal symptoms related to bp or her CHF to avoid readmissions or ED visits (pt verbalized an understanding). Offered arm cuff however declined due to pt's excess skin-folds due to weight loss that irritates pt's skin area.  Verified pt continues to have sufficient transportation to medical appointments. Verified pt adherent with taking all her prescribed medications with no needed refills.  No additional needs at this time as pt and caregiver daughter Nevin Bloodgood are aware on who to contact this RN case manager for any additional needs or resources. Case will be closed.          Please call the care guide team at [phone] if you need to cancel or reschedule your appointment.   If you are experiencing a Mental Health or [place] or need someone to talk to, please call the Suicide and Crisis Lifeline: 988  Patient verbalizes understanding of instructions and care plan provided today and agrees to view in [place]. Active MyChart status and patient understanding of how to access instructions and care plan via MyChart confirmed with  patient.     No further follow up required: No further follow up needs  Raina Mina, RN Care Management Coordinator [place] Office [phone]

## 2022-04-25 DIAGNOSIS — N202 Calculus of kidney with calculus of ureter: Secondary | ICD-10-CM | POA: Diagnosis not present

## 2022-05-16 ENCOUNTER — Encounter: Payer: Self-pay | Admitting: Gastroenterology

## 2022-05-16 ENCOUNTER — Ambulatory Visit (INDEPENDENT_AMBULATORY_CARE_PROVIDER_SITE_OTHER): Payer: Medicare HMO | Admitting: Gastroenterology

## 2022-05-16 VITALS — BP 130/58 | HR 58 | Ht 64.0 in | Wt 154.0 lb

## 2022-05-16 DIAGNOSIS — R32 Unspecified urinary incontinence: Secondary | ICD-10-CM

## 2022-05-16 DIAGNOSIS — K5909 Other constipation: Secondary | ICD-10-CM | POA: Diagnosis not present

## 2022-05-16 DIAGNOSIS — R159 Full incontinence of feces: Secondary | ICD-10-CM

## 2022-05-16 NOTE — Progress Notes (Signed)
HPI :  88 year old female with a history of chronic constipation, hypertension, heart failure, referred by Lelon Frohlich, MD for chronic constipation, question of fecal impaction, fecal incontinence.  Patient is accompanied by her daughter today who helps provide some of her history.  She states she has not had longstanding history of chronic constipation.  She previously lived in [place] and moved to [place] about 3 years ago.  When in [place] she had multiple trips to the emergency room, she states on average she was there every 6 months for fecal impaction and required disimpactions in the past.  She states previously when living in [place] she would have a bowel movement on average once per week.  Since moving to [place] where her daughter checks on her frequently, she is drinking much more water, she will have a bowel movement roughly once every other day.  She has been on a regimen of MiraLAX once daily as well as Colace or senna a few times daily.  Her stool frequency has been improved compared to previous on the regimen, however she has times where she passes stool and does not realize she is doing so, leading to incontinence.  She wears depends to help manage this.  She also has significant urinary incontinence that causes her problems.  Incontinence has been ongoing for some time.  She has never seen pelvic floor physical therapy.  She does not see any blood in her stool.  She had a colonoscopy in the past but states it has been several years, was done in [place].  She does not have any reports of that on file.  More recently, she was hospitalized from 03/08/2022-03/12/2022 for acute respiratory failure due to flash pulmonary edema related to hypertensive urgency as well as acute on chronic heart failure with preserved ejection fraction.  Her respiratory status has improved, she is not requiring any oxygen.  She does require walker for ambulation assistance.  Her primary  care obtained an x-ray of her abdomen on November 5 showing suspected fecal impaction with moderate stool volume in the rectum.  On review of her medication list she has been on Elavil 25 mg nightly for a long time, states she was placed on this by urologist years ago, although unclear if they were treating anxiety with this or issues with her bladder?   Abdominal xray - 03/30/22: IMPRESSION: 1. Increased moderate fecal stasis. No evidence of bowel obstruction. Suspected moderate impacted stool in the rectum. 2. Interval removal of previous double-J right ureteral stent. 3. Cardiomegaly. 4. Osteopenia and degenerative change.   Echocardiogram 03/10/22: EF 65-70%, mod LVH,     Past Medical History:  Diagnosis Date   Arthritis    Complication of anesthesia    after gallbladder surgery with extubation had shallow respirations,  needed to continue intudation for additonal 10-12 days   Diabetes ([place])    DVT (deep venous thrombosis) (HCC)    left leg after hysterectomy   GERD (gastroesophageal reflux disease)    History of COVID-19 05/2021   History of kidney stones    History of transient ischemic attack (TIA)    HOH (hard of hearing)    Hyperlipidemia    Hypertension    Kidney stones 2023   Sleep apnea      Past Surgical History:  Procedure Laterality Date   ABDOMINAL HYSTERECTOMY     APPENDECTOMY     CATARACT EXTRACTION, BILATERAL     CHOLECYSTECTOMY     COLONOSCOPY  CYSTOSCOPY WITH STENT PLACEMENT Right 08/14/2021   Procedure: CYSTOSCOPY WITH STENT PLACEMENT;  Surgeon: Ardis Hughs, MD;  Location: [facility];  Service: Urology;  Laterality: Right;  ONLY NEEDS 30 MIN   EXTRACORPOREAL SHOCK WAVE LITHOTRIPSY Right 02/06/2022   Procedure: RIGHT EXTRACORPOREAL SHOCK WAVE LITHOTRIPSY (ESWL);  Surgeon: Ardis Hughs, MD;  Location: [facility];  Service: Urology;  Laterality: Right;   Family History  Problem Relation Age of Onset   CAD Maternal  Grandmother    Heart disease Maternal Grandmother    Pneumonia Maternal Grandfather    Liver disease Neg Hx    Colon cancer Neg Hx    Esophageal cancer Neg Hx    Social History   Tobacco Use   Smoking status: Former   Smokeless tobacco: Never  Scientific laboratory technician Use: Never used  Substance Use Topics   Alcohol use: Not Currently   Drug use: Never   Current Outpatient Medications  Medication Sig Dispense Refill   Accu-Chek FastClix Lancets MISC USE AS DIRECTED 102 each 3   albuterol (VENTOLIN HFA) 108 (90 Base) MCG/ACT inhaler Inhale 2 puffs into the lungs every 6 (six) hours as needed for wheezing or shortness of breath. 8 g 2   Alcohol Swabs (B-D SINGLE USE SWABS REGULAR) PADS USE AS DIRECTED EVERY DAY 100 each 0   amitriptyline (ELAVIL) 25 MG tablet Take 1 tablet every third night (Patient taking differently: Take 25 mg by mouth See admin instructions. Take 25mg  by mouth every fourth night) 45 tablet 1   amLODipine (NORVASC) 10 MG tablet Take 1 tablet (10 mg total) by mouth daily. 30 tablet 2   aspirin EC 81 MG tablet Take 81 mg by mouth in the morning.     atorvastatin (LIPITOR) 20 MG tablet Take 1 tablet (20 mg total) by mouth at bedtime. 90 tablet 0   bisoprolol (ZEBETA) 5 MG tablet TAKE 1 TABLET EVERY DAY 90 tablet 1   Blood Glucose Monitoring Suppl (TRUE METRIX METER) DEVI 1 each by Does not apply route daily. 1 each 1   budesonide (PULMICORT) 0.25 MG/2ML nebulizer solution Inhale into the lungs.     Cholecalciferol (VITAMIN D3) 50 MCG (2000 UT) TABS Take 2,000 Units by mouth in the morning.     furosemide (LASIX) 20 MG tablet Take 1 tablet (20 mg total) by mouth daily. 30 tablet 2   Multiple Vitamin (MULTIVITAMIN WITH MINERALS) TABS tablet Take 1 tablet by mouth daily. Centrum Silver     Multiple Vitamins-Minerals (PRESERVISION AREDS 2 PO) Take 1 tablet by mouth in the morning and at bedtime.     mupirocin ointment (BACTROBAN) 2 % Apply 1 Application topically 2 (two) times  daily as needed (anti-fungal bacteria).     Omega-3 Fatty Acids (FISH OIL) 1000 MG CAPS Take 2,000 mg by mouth daily.     Polyethyl Glycol-Propyl Glycol (SYSTANE ULTRA) 0.4-0.3 % SOLN Place 1-2 drops into both eyes 3 (three) times daily as needed (dry/irritated eyes.).     polyethylene glycol powder (GLYCOLAX/MIRALAX) 17 GM/SCOOP powder Take 17 g by mouth 2 (two) times daily as needed. (Patient taking differently: Take 17 g by mouth in the morning and at bedtime.) 3350 g 1   potassium chloride SA (KLOR-CON M) 20 MEQ tablet Take 1 tablet (20 mEq total) by mouth daily. 30 tablet 2   Probiotic Product (PROBIOTIC PO) Take 1 capsule by mouth in the morning.     senna-docusate (SENOKOT-S) 8.6-50 MG tablet  Take 1 tablet by mouth 2 (two) times daily. 60 tablet 2   TRUE METRIX BLOOD GLUCOSE TEST test strip TEST BLOOD SUGAR EVERY DAY 100 strip 10   No current facility-administered medications for this visit.   Allergies  Allergen Reactions   Coconut Oil    Cocos Nucifera Hives   Penicillins Hives   Bactrim [Sulfamethoxazole-Trimethoprim] Nausea And Vomiting   Other Hives    Berries      Review of Systems: All systems reviewed and negative except where noted in HPI.   Lab Results  Component Value Date   WBC 9.5 03/09/2022   HGB 11.0 (L) 03/09/2022   HCT 34.5 (L) 03/09/2022   MCV 86.5 03/09/2022   PLT 294 03/09/2022    Lab Results  Component Value Date   CREATININE 1.50 (H) 03/12/2022   BUN 24 (H) 03/12/2022   NA 136 03/12/2022   K 4.2 03/12/2022   CL 102 03/12/2022   CO2 27 03/12/2022    Lab Results  Component Value Date   ALT 23 03/08/2022   AST 22 03/08/2022   ALKPHOS 114 03/08/2022   BILITOT 0.7 03/08/2022    Physical Exam: BP (!) 130/58   Pulse (!) 58   Ht $R'5\' 4"'Zc$  (1.626 m)   Wt 154 lb (69.9 kg)   SpO2 98%   BMI 26.43 kg/m  Constitutional: Pleasant,  female in no acute distress. HEENT: Normocephalic and atraumatic. Conjunctivae are normal. No scleral icterus. Neck  supple.  Cardiovascular: Normal rate, regular rhythm.  Pulmonary/chest: Effort normal and breath sounds normal.  Abdominal: Soft, nondistended, nontender.  There are no masses palpable.  DRE - CMA Quintin [place] as standby -no impacted stool in the rectal vault appreciated, soft stool only.  No polyps or mass lesions.  Low resting rectal tone /squeeze pressure, normal descent. Extremities: no edema Neurological: Alert and oriented to person place and time. Skin: Skin is warm and dry. No rashes noted. Psychiatric: Normal mood and affect. Behavior is normal.   ASSESSMENT: 88 y.o. female here for assessment of the following  1. Chronic constipation   2. Incontinence of feces, unspecified fecal incontinence type   3. Urinary incontinence, unspecified type    History as above, chronic constipation with history of numerous impactions in the past.  Now drinking more water and on her stable bowel regimen and moving her bowels more frequently, but having episodes of leakage of stool and incontinence at times in the setting of ongoing urinary incontinence.  Rectal exam does not show any fecal impaction on exam today although quite possible she has increased stool burden more proximally.  I discussed options with the patient and her daughter.  Recommend a bowel preparation to clear out her colon in case she has more proximal stool or partial impaction/overflow incontinence.  She is agreeable to do a MiraLAX bowel prep and we discussed with him how to do this.  When she is done with that, I would continue MiraLAX daily but add Citrucel daily to help bulk her stool and see if she can help retain this better and minimize leakage.  Given her ongoing constipation and age, I do not think Elavil is a great long-term option for her, could be contributing to constipation and recommend she talk with her primary care about stopping this.  Otherwise, given her DRE findings and urinary incontinence, I will also refer her  to pelvic floor physical therapy to see if that will help that aspect of things as well.  We  want to avoid colonoscopy, given her age and comorbidities, if possible.  Daughter and patient are in agreement with this.  No alarm symptoms otherwise, no bleeding etc.  Will await her course, if no improvement she will contact us for reassessment.  PLAN: - Miralax bowel prep - 238gm bottle, mix with 2 x 32 oz Gatorades - add Citrucel once daily - continue Miralax daily to BID - recommend she stop Elavil, can discuss with her primary care - can use enemas in the future if concerned about possible underlying impaction, does not need that currently however - refer to pelvic floor PT  Follow up as needed or with persistent symptoms.  Jolly Mango, MD [facility]  CC: [name], [place]*

## 2022-05-16 NOTE — Patient Instructions (Addendum)
_______________________________________________________  If you are age 86 or older, your body mass index should be between 23-30. Your Body mass index is 26.43 kg/m. If this is out of the aforementioned range listed, please consider follow up with your Primary Care Provider.  If you are age 76 or younger, your body mass index should be between 19-25. Your Body mass index is 26.43 kg/m. If this is out of the aformentioned range listed, please consider follow up with your Primary Care Provider.   ________________________________________________________  The [facility] providers would like to encourage you to use [facility] to communicate with providers for non-urgent requests or questions.  Due to long hold times on the telephone, sending your provider a message by [facility] may be a faster and more efficient way to get a response.  Please allow 48 business hours for a response.  Please remember that this is for non-urgent requests.  _______________________________________________________  Due to recent changes in healthcare laws, you may see the results of your imaging and laboratory studies on MyChart before your provider has had a chance to review them.  We understand that in some cases there may be results that are confusing or concerning to you. Not all laboratory results come back in the same time frame and the provider may be waiting for multiple results in order to interpret others.  Please give [place] 48 hours in order for your provider to thoroughly review all the results before contacting the office for clarification of your results.   _______________________________________________________  Dr Havery Moros recommends that you complete a bowel purge (to clean out your bowels). Please do the following: Purchase a 238 g bottle of Miralax over the counter as well as two 32 ounces bottles of Gatorade. Mix 1/2 bottle Miralax in 32 oz Gatorade and drink over a 2-3 hour period. Repeat.  You should expect  results within 1 to 6 hours after completing the bowel purge. _______________________________________________________  Please purchase the following medications over the counter and take as directed: Citrucel  _________________________________________________________  Continue Miralax twice daily _________________________________________________________  Discontinue amitriptyline/Elavil _________________________________________________________  You have been referred to Pelvic floor therapy. If you have not received an appointment date within the next 3 weeks, please let us know.

## 2022-05-21 ENCOUNTER — Telehealth: Payer: Self-pay | Admitting: Gastroenterology

## 2022-05-21 ENCOUNTER — Other Ambulatory Visit: Payer: Self-pay | Admitting: Internal Medicine

## 2022-05-21 NOTE — Telephone Encounter (Signed)
Patient called states she was given prep instructions but would like to go over them with someone is very confused. Please call [phone]

## 2022-05-21 NOTE — Telephone Encounter (Signed)
Spoke with patient who indicates due to her difficulty hearing, she was unable to really understand what Dr Havery Moros wanted her to do with gatorade and miralax. Since she has accommodations on her phone to hear better, she would like repeat instructions for miralax purge. Instructions have been reiterated for her (see patient instructions from 05/16/22 office visit for specifics). She indicates that she does have understanding of this information now.

## 2022-05-21 NOTE — Telephone Encounter (Addendum)
Pt is calling and need new rx from dr Jerilee Hoh Furosemide (LASIX) 20 MG table  and potassium chloride SA (KLOR-CON M) 20 MEQ tablet [id number] [place], [place] [address] Phone: [phone]  Fax: [phone]

## 2022-05-28 MED ORDER — FUROSEMIDE 20 MG PO TABS: 20.0000 mg | ORAL_TABLET | Freq: Every day | ORAL | 2 refills | Status: DC

## 2022-05-28 MED ORDER — POTASSIUM CHLORIDE CRYS ER 20 MEQ PO TBCR: 20.0000 meq | EXTENDED_RELEASE_TABLET | Freq: Every day | ORAL | 2 refills | Status: DC

## 2022-05-28 NOTE — Telephone Encounter (Signed)
Both filled last by  Vernelle Emerald, MD . Faythe Ghee to refill?

## 2022-05-28 NOTE — Telephone Encounter (Signed)
Refills sent

## 2022-05-30 DIAGNOSIS — M6281 Muscle weakness (generalized): Secondary | ICD-10-CM | POA: Diagnosis not present

## 2022-06-06 DIAGNOSIS — M6281 Muscle weakness (generalized): Secondary | ICD-10-CM | POA: Diagnosis not present

## 2022-06-13 DIAGNOSIS — M6281 Muscle weakness (generalized): Secondary | ICD-10-CM | POA: Diagnosis not present

## 2022-06-20 DIAGNOSIS — M6281 Muscle weakness (generalized): Secondary | ICD-10-CM | POA: Diagnosis not present

## 2022-06-27 DIAGNOSIS — M6281 Muscle weakness (generalized): Secondary | ICD-10-CM | POA: Diagnosis not present

## 2022-06-29 ENCOUNTER — Encounter (HOSPITAL_COMMUNITY): Payer: Self-pay

## 2022-06-29 ENCOUNTER — Inpatient Hospital Stay (HOSPITAL_COMMUNITY)
Admission: EM | Admit: 2022-06-29 | Discharge: 2022-07-03 | DRG: 202 | Disposition: A | Discharge status: Home Health | Payer: Medicare HMO | Attending: Internal Medicine | Admitting: Internal Medicine

## 2022-06-29 ENCOUNTER — Other Ambulatory Visit: Payer: Self-pay

## 2022-06-29 ENCOUNTER — Emergency Department (HOSPITAL_COMMUNITY): Payer: Medicare HMO

## 2022-06-29 DIAGNOSIS — G4730 Sleep apnea, unspecified: Secondary | ICD-10-CM | POA: Diagnosis present

## 2022-06-29 DIAGNOSIS — J111 Influenza due to unidentified influenza virus with other respiratory manifestations: Secondary | ICD-10-CM

## 2022-06-29 DIAGNOSIS — E785 Hyperlipidemia, unspecified: Secondary | ICD-10-CM | POA: Diagnosis present

## 2022-06-29 DIAGNOSIS — Z8673 Personal history of transient ischemic attack (TIA), and cerebral infarction without residual deficits: Secondary | ICD-10-CM | POA: Diagnosis not present

## 2022-06-29 DIAGNOSIS — R6510 Systemic inflammatory response syndrome (SIRS) of non-infectious origin without acute organ dysfunction: Secondary | ICD-10-CM | POA: Insufficient documentation

## 2022-06-29 DIAGNOSIS — R0981 Nasal congestion: Secondary | ICD-10-CM | POA: Diagnosis not present

## 2022-06-29 DIAGNOSIS — G4489 Other headache syndrome: Secondary | ICD-10-CM | POA: Diagnosis not present

## 2022-06-29 DIAGNOSIS — R509 Fever, unspecified: Secondary | ICD-10-CM | POA: Diagnosis not present

## 2022-06-29 DIAGNOSIS — Z880 Allergy status to penicillin: Secondary | ICD-10-CM

## 2022-06-29 DIAGNOSIS — Z86718 Personal history of other venous thrombosis and embolism: Secondary | ICD-10-CM

## 2022-06-29 DIAGNOSIS — K6389 Other specified diseases of intestine: Secondary | ICD-10-CM | POA: Diagnosis not present

## 2022-06-29 DIAGNOSIS — Z66 Do not resuscitate: Secondary | ICD-10-CM | POA: Diagnosis present

## 2022-06-29 DIAGNOSIS — K5900 Constipation, unspecified: Secondary | ICD-10-CM

## 2022-06-29 DIAGNOSIS — I10 Essential (primary) hypertension: Secondary | ICD-10-CM | POA: Diagnosis not present

## 2022-06-29 DIAGNOSIS — Z1152 Encounter for screening for COVID-19: Secondary | ICD-10-CM | POA: Diagnosis not present

## 2022-06-29 DIAGNOSIS — J208 Acute bronchitis due to other specified organisms: Principal | ICD-10-CM | POA: Diagnosis present

## 2022-06-29 DIAGNOSIS — E1169 Type 2 diabetes mellitus with other specified complication: Secondary | ICD-10-CM | POA: Diagnosis present

## 2022-06-29 DIAGNOSIS — E1122 Type 2 diabetes mellitus with diabetic chronic kidney disease: Secondary | ICD-10-CM | POA: Diagnosis present

## 2022-06-29 DIAGNOSIS — K219 Gastro-esophageal reflux disease without esophagitis: Secondary | ICD-10-CM | POA: Diagnosis present

## 2022-06-29 DIAGNOSIS — I5032 Chronic diastolic (congestive) heart failure: Secondary | ICD-10-CM | POA: Diagnosis present

## 2022-06-29 DIAGNOSIS — Z7951 Long term (current) use of inhaled steroids: Secondary | ICD-10-CM

## 2022-06-29 DIAGNOSIS — Z79899 Other long term (current) drug therapy: Secondary | ICD-10-CM | POA: Diagnosis not present

## 2022-06-29 DIAGNOSIS — N1832 Chronic kidney disease, stage 3b: Secondary | ICD-10-CM | POA: Diagnosis not present

## 2022-06-29 DIAGNOSIS — I959 Hypotension, unspecified: Secondary | ICD-10-CM | POA: Diagnosis not present

## 2022-06-29 DIAGNOSIS — Z8249 Family history of ischemic heart disease and other diseases of the circulatory system: Secondary | ICD-10-CM | POA: Diagnosis not present

## 2022-06-29 DIAGNOSIS — N200 Calculus of kidney: Secondary | ICD-10-CM | POA: Diagnosis not present

## 2022-06-29 DIAGNOSIS — Z7982 Long term (current) use of aspirin: Secondary | ICD-10-CM

## 2022-06-29 DIAGNOSIS — R112 Nausea with vomiting, unspecified: Secondary | ICD-10-CM | POA: Diagnosis not present

## 2022-06-29 DIAGNOSIS — K5904 Chronic idiopathic constipation: Secondary | ICD-10-CM | POA: Diagnosis not present

## 2022-06-29 DIAGNOSIS — D649 Anemia, unspecified: Secondary | ICD-10-CM | POA: Diagnosis not present

## 2022-06-29 DIAGNOSIS — J101 Influenza due to other identified influenza virus with other respiratory manifestations: Secondary | ICD-10-CM | POA: Diagnosis not present

## 2022-06-29 DIAGNOSIS — Z9102 Food additives allergy status: Secondary | ICD-10-CM | POA: Diagnosis not present

## 2022-06-29 DIAGNOSIS — Z91048 Other nonmedicinal substance allergy status: Secondary | ICD-10-CM

## 2022-06-29 DIAGNOSIS — Z882 Allergy status to sulfonamides status: Secondary | ICD-10-CM | POA: Diagnosis not present

## 2022-06-29 DIAGNOSIS — Z8616 Personal history of COVID-19: Secondary | ICD-10-CM | POA: Diagnosis not present

## 2022-06-29 DIAGNOSIS — J449 Chronic obstructive pulmonary disease, unspecified: Secondary | ICD-10-CM | POA: Diagnosis present

## 2022-06-29 DIAGNOSIS — I130 Hypertensive heart and chronic kidney disease with heart failure and stage 1 through stage 4 chronic kidney disease, or unspecified chronic kidney disease: Secondary | ICD-10-CM | POA: Diagnosis present

## 2022-06-29 DIAGNOSIS — Z87891 Personal history of nicotine dependence: Secondary | ICD-10-CM

## 2022-06-29 DIAGNOSIS — J189 Pneumonia, unspecified organism: Secondary | ICD-10-CM | POA: Diagnosis not present

## 2022-06-29 DIAGNOSIS — K567 Ileus, unspecified: Secondary | ICD-10-CM | POA: Diagnosis not present

## 2022-06-29 DIAGNOSIS — K5641 Fecal impaction: Secondary | ICD-10-CM | POA: Diagnosis not present

## 2022-06-29 LAB — URINALYSIS, ROUTINE W REFLEX MICROSCOPIC
Bilirubin Urine: NEGATIVE
Glucose, UA: 50 mg/dL — AB
Hgb urine dipstick: NEGATIVE
Ketones, ur: NEGATIVE mg/dL
Leukocytes,Ua: NEGATIVE
Nitrite: NEGATIVE
Protein, ur: >=300 mg/dL — AB
Specific Gravity, Urine: 1.018 (ref 1.005–1.030)
pH: 5 (ref 5.0–8.0)

## 2022-06-29 LAB — RESPIRATORY PANEL BY PCR

## 2022-06-29 LAB — CBC WITH DIFFERENTIAL/PLATELET
Abs Immature Granulocytes: 0.14 10*3/uL — ABNORMAL HIGH (ref 0.00–0.07)
Basophils Absolute: 0.1 10*3/uL (ref 0.0–0.1)
Basophils Relative: 0 %
Eosinophils Absolute: 0.1 10*3/uL (ref 0.0–0.5)
Eosinophils Relative: 0 %
HCT: 36.3 % (ref 36.0–46.0)
Hemoglobin: 11.4 g/dL — ABNORMAL LOW (ref 12.0–15.0)
Immature Granulocytes: 1 %
Lymphocytes Relative: 4 %
Lymphs Abs: 0.7 10*3/uL (ref 0.7–4.0)
MCH: 27.2 pg (ref 26.0–34.0)
MCHC: 31.4 g/dL (ref 30.0–36.0)
MCV: 86.6 fL (ref 80.0–100.0)
Monocytes Absolute: 1.5 10*3/uL — ABNORMAL HIGH (ref 0.1–1.0)
Monocytes Relative: 9 %
Neutro Abs: 15 10*3/uL — ABNORMAL HIGH (ref 1.7–7.7)
Neutrophils Relative %: 86 %
Platelets: 236 10*3/uL (ref 150–400)
RBC: 4.19 MIL/uL (ref 3.87–5.11)
RDW: 15.3 % (ref 11.5–15.5)
WBC: 17.5 10*3/uL — ABNORMAL HIGH (ref 4.0–10.5)
nRBC: 0 % (ref 0.0–0.2)

## 2022-06-29 LAB — GLUCOSE, CAPILLARY
Glucose-Capillary: 117 mg/dL — ABNORMAL HIGH (ref 70–99)
Glucose-Capillary: 137 mg/dL — ABNORMAL HIGH (ref 70–99)

## 2022-06-29 LAB — CBG MONITORING, ED: Glucose-Capillary: 136 mg/dL — ABNORMAL HIGH (ref 70–99)

## 2022-06-29 LAB — RESP PANEL BY RT-PCR (RSV, FLU A&B, COVID)  RVPGX2
Influenza A by PCR: NEGATIVE
Influenza B by PCR: NEGATIVE
Resp Syncytial Virus by PCR: NEGATIVE
SARS Coronavirus 2 by RT PCR: NEGATIVE

## 2022-06-29 LAB — BASIC METABOLIC PANEL
Anion gap: 12 (ref 5–15)
BUN: 22 mg/dL (ref 8–23)
CO2: 22 mmol/L (ref 22–32)
Calcium: 8.9 mg/dL (ref 8.9–10.3)
Chloride: 101 mmol/L (ref 98–111)
Creatinine, Ser: 1.27 mg/dL — ABNORMAL HIGH (ref 0.44–1.00)
GFR, Estimated: 40 mL/min — ABNORMAL LOW (ref >60)
Glucose, Bld: 180 mg/dL — ABNORMAL HIGH (ref 70–99)
Potassium: 3.9 mmol/L (ref 3.5–5.1)
Sodium: 135 mmol/L (ref 135–145)

## 2022-06-29 LAB — HEMOGLOBIN A1C
Hgb A1c MFr Bld: 6.4 % — ABNORMAL HIGH (ref 4.8–5.6)
Mean Plasma Glucose: 136.98 mg/dL

## 2022-06-29 LAB — PROCALCITONIN: Procalcitonin: <0.1 ng/mL

## 2022-06-29 LAB — STREP PNEUMONIAE URINARY ANTIGEN: Strep Pneumo Urinary Antigen: NEGATIVE

## 2022-06-29 MED ORDER — INSULIN ASPART 100 UNIT/ML IJ SOLN
0.0000 [IU] | Freq: Three times a day (TID) | INTRAMUSCULAR | Status: DC
  Administered 2022-06-29 – 2022-07-03 (×5): 1 [IU] via SUBCUTANEOUS
  Filled 2022-06-29: qty 0.09

## 2022-06-29 MED ORDER — SODIUM CHLORIDE 0.9 % IV SOLN: 500.0000 mg | INTRAVENOUS | Status: DC

## 2022-06-29 MED ORDER — ONDANSETRON HCL 4 MG/2ML IJ SOLN: 4.0000 mg | Freq: Four times a day (QID) | INTRAMUSCULAR | Status: DC | PRN

## 2022-06-29 MED ORDER — INSULIN ASPART 100 UNIT/ML IJ SOLN
0.0000 [IU] | Freq: Every day | INTRAMUSCULAR | Status: DC
  Filled 2022-06-29: qty 0.05

## 2022-06-29 MED ORDER — OXYCODONE HCL 5 MG PO TABS: 5.0000 mg | ORAL_TABLET | ORAL | Status: DC | PRN

## 2022-06-29 MED ORDER — ACETAMINOPHEN 650 MG RE SUPP: 650.0000 mg | Freq: Four times a day (QID) | RECTAL | Status: DC | PRN

## 2022-06-29 MED ORDER — DOXYCYCLINE HYCLATE 100 MG PO TABS
100.0000 mg | ORAL_TABLET | Freq: Once | ORAL | Status: AC
  Administered 2022-06-29: 100 mg via ORAL
  Filled 2022-06-29: qty 1

## 2022-06-29 MED ORDER — ACETAMINOPHEN 325 MG PO TABS: 650.0000 mg | ORAL_TABLET | Freq: Four times a day (QID) | ORAL | Status: DC | PRN

## 2022-06-29 MED ORDER — AMLODIPINE BESYLATE 10 MG PO TABS
10.0000 mg | ORAL_TABLET | Freq: Once | ORAL | Status: AC
  Administered 2022-06-29: 10 mg via ORAL
  Filled 2022-06-29: qty 1

## 2022-06-29 MED ORDER — SODIUM CHLORIDE 0.9 % IV SOLN
500.0000 mg | Freq: Every day | INTRAVENOUS | Status: DC
  Administered 2022-06-29 – 2022-07-01 (×3): 500 mg via INTRAVENOUS
  Filled 2022-06-29 (×3): qty 5

## 2022-06-29 MED ORDER — DOCUSATE SODIUM 100 MG PO CAPS
100.0000 mg | ORAL_CAPSULE | Freq: Two times a day (BID) | ORAL | Status: DC
  Administered 2022-06-29 (×2): 100 mg via ORAL
  Filled 2022-06-29 (×2): qty 1

## 2022-06-29 MED ORDER — SODIUM CHLORIDE 0.9 % IV SOLN: INTRAVENOUS | Status: DC

## 2022-06-29 MED ORDER — LACTATED RINGERS IV BOLUS
250.0000 mL | Freq: Once | INTRAVENOUS | Status: AC
  Administered 2022-06-29: 250 mL via INTRAVENOUS

## 2022-06-29 MED ORDER — SODIUM CHLORIDE 0.9 % IV SOLN
2.0000 g | INTRAVENOUS | Status: DC
  Administered 2022-06-30 – 2022-07-03 (×4): 2 g via INTRAVENOUS
  Filled 2022-06-29 (×4): qty 20

## 2022-06-29 MED ORDER — SODIUM CHLORIDE 0.9 % IV SOLN
1.0000 g | Freq: Once | INTRAVENOUS | Status: AC
  Administered 2022-06-29: 1 g via INTRAVENOUS
  Filled 2022-06-29: qty 10

## 2022-06-29 MED ORDER — ONDANSETRON HCL 4 MG PO TABS: 4.0000 mg | ORAL_TABLET | Freq: Four times a day (QID) | ORAL | Status: DC | PRN

## 2022-06-29 NOTE — ED Provider Notes (Signed)
Received in hand off Flu like illness  Follow up urine and labs Cxr 89 yo female from home with onset of shaking chills, weakness, dyspnea last night.  Patient is febrile CXR with some bilateral lower lobe Patient generally weak, tachypneic, sats 94-95% Does not require oxygen at this time. Will admit for iv abx, monitoring Discussed with Dr. Marylyn Ishihara who will see and assess    Pattricia Boss, MD 06/29/22 1034

## 2022-06-29 NOTE — ED Provider Notes (Signed)
[place] EMERGENCY DEPARTMENT AT [facility] Provider Note   CSN: [id number] Arrival date & time: 06/29/22  2820     History  Chief Complaint  Patient presents with   Nasal Congestion   Fever   Generalized Body Aches    Stacey Armstrong is a 89 y.o. female.  The history is provided by the patient.  Fever She has history of hypertension, diabetes, hyperlipidemia, transient ischemic attack, DVT not currently on anticoagulation and comes in because of onset last night of subjective fever, chills, body aches, nausea.  She denies diaphoresis.  There has been no cough but she has had occasional sneezing.  She has not vomited and denies diarrhea but has had urinary frequency.  She denies any sick contacts.   Home Medications Prior to Admission medications   Medication Sig Start Date End Date Taking? Authorizing Provider  Accu-Chek FastClix Lancets MISC USE AS DIRECTED 04/07/22   [name], Rayford Halsted, MD  albuterol (VENTOLIN HFA) 108 (90 Base) MCG/ACT inhaler Inhale 2 puffs into the lungs every 6 (six) hours as needed for wheezing or shortness of breath. 09/09/21   Oswald Hillock, MD  Alcohol Swabs (B-D SINGLE USE SWABS REGULAR) PADS USE AS DIRECTED EVERY DAY 09/11/20   [name], Rayford Halsted, MD  amitriptyline (ELAVIL) 25 MG tablet Take 1 tablet every third night Patient taking differently: Take 25 mg by mouth See admin instructions. Take 25mg  by mouth every fourth night 08/26/21   [name], Rayford Halsted, MD  amLODipine (NORVASC) 10 MG tablet Take 1 tablet (10 mg total) by mouth daily. 03/13/22   Shalhoub, Sherryll Burger, MD  aspirin EC 81 MG tablet Take 81 mg by mouth in the morning.    [provider]  atorvastatin (LIPITOR) 20 MG tablet Take 1 tablet (20 mg total) by mouth at bedtime. 02/24/22   [name], Rayford Halsted, MD  bisoprolol (ZEBETA) 5 MG tablet TAKE 1 TABLET EVERY DAY 12/30/21   [name], Rayford Halsted, MD  Blood Glucose Monitoring Suppl (TRUE METRIX  METER) DEVI 1 each by Does not apply route daily. 09/28/20   [name], Rayford Halsted, MD  budesonide (PULMICORT) 0.25 MG/2ML nebulizer solution Inhale into the lungs. 09/09/21   [provider]  Cholecalciferol (VITAMIN D3) 50 MCG (2000 UT) TABS Take 2,000 Units by mouth in the morning.    [provider]  furosemide (LASIX) 20 MG tablet Take 1 tablet (20 mg total) by mouth daily. 05/28/22 08/26/22  [name], Rayford Halsted, MD  Multiple Vitamin (MULTIVITAMIN WITH MINERALS) TABS tablet Take 1 tablet by mouth daily. Centrum Silver    [provider]  Multiple Vitamins-Minerals (PRESERVISION AREDS 2 PO) Take 1 tablet by mouth in the morning and at bedtime.    [provider]  mupirocin ointment (BACTROBAN) 2 % Apply 1 Application topically 2 (two) times daily as needed (anti-fungal bacteria).    [provider]  Omega-3 Fatty Acids (FISH OIL) 1000 MG CAPS Take 2,000 mg by mouth daily.    [provider]  Polyethyl Glycol-Propyl Glycol (SYSTANE ULTRA) 0.4-0.3 % SOLN Place 1-2 drops into both eyes 3 (three) times daily as needed (dry/irritated eyes.).    [provider]  polyethylene glycol powder (GLYCOLAX/MIRALAX) 17 GM/SCOOP powder Take 17 g by mouth 2 (two) times daily as needed. Patient taking differently: Take 17 g by mouth in the morning and at bedtime. 12/30/18   [name], Rayford Halsted, MD  potassium chloride SA (KLOR-CON M)  20 MEQ tablet Take 1 tablet (20 mEq total) by mouth daily. 05/28/22   [name], Rayford Halsted, MD  Probiotic Product (PROBIOTIC PO) Take 1 capsule by mouth in the morning.    [provider]  senna-docusate (SENOKOT-S) 8.6-50 MG tablet Take 1 tablet by mouth 2 (two) times daily. 03/12/22   Shalhoub, Sherryll Burger, MD  TRUE METRIX BLOOD GLUCOSE TEST test strip TEST BLOOD SUGAR EVERY DAY 03/10/22   [name], Rayford Halsted, MD      Allergies    Coconut oil, Cocos nucifera, Penicillins, Bactrim  [sulfamethoxazole-trimethoprim], and Other    Review of Systems   Review of Systems  Constitutional:  Positive for fever.  All other systems reviewed and are negative.   Physical Exam Updated Vital Signs BP (!) 169/52 (BP Location: Right Arm)   Pulse 62   Temp 99.7 F (37.6 C) (Oral)   Resp 17   SpO2 93%  Physical Exam Vitals and nursing note reviewed.   89 year old female, resting comfortably and in no acute distress. Vital signs are significant for elevated blood pressure.  Temperature while not technically a fever, is high for this time of day.. Oxygen saturation is 93%, which is normal. Head is normocephalic and atraumatic. PERRLA, EOMI. Oropharynx is clear. Neck is nontender and supple without adenopathy or JVD. Back is nontender and there is no CVA tenderness. Lungs are clear without rales, wheezes, or rhonchi. Chest is nontender. Heart has regular rate and rhythm without murmur. Abdomen is soft, flat, nontender. Extremities have 2+ pretibial edema, full range of motion is present. Skin is warm and dry without rash. Neurologic: Mental status is normal, cranial nerves are intact, moves all extremities equally.  ED Results / Procedures / Treatments   Labs (all labs ordered are listed, but only abnormal results are displayed) Labs Reviewed  CBC WITH DIFFERENTIAL/PLATELET - Abnormal; Notable for the following components:      Result Value   WBC 17.5 (*)    Hemoglobin 11.4 (*)    Neutro Abs 15.0 (*)    Monocytes Absolute 1.5 (*)    Abs Immature Granulocytes 0.14 (*)    All other components within normal limits  RESP PANEL BY RT-PCR (RSV, FLU A&B, COVID)  [id number]  URINALYSIS, ROUTINE W REFLEX MICROSCOPIC  BASIC METABOLIC PANEL   Procedures Procedures    Medications Ordered in ED Medications - No data to display  ED Course/ Medical Decision Making/ A&P                             Medical Decision Making Amount and/or Complexity of Data Reviewed Labs:  ordered.   Influenza-like illness.  With urinary frequency, I am concerned about possible urinary tract infection.  Patient's daughter is concerned she might be dehydrated, so I have ordered CBC and basic metabolic panel.  I have also ordered respiratory pathogen panel.  I have reviewed and interpreted her laboratory test, and my interpretation is negative respiratory pathogen panel, moderate leukocytosis with left shift which is nonspecific, basic metabolic panel and urinalysis still pending.  Case is signed out to Dr. Jeanell Sparrow, oncoming physician.  Final Clinical Impression(s) / ED Diagnoses Final diagnoses:  Influenza-like illness  Normochromic normocytic anemia    Rx / DC Orders ED Discharge Orders     None         Delora Fuel, MD 11/91/47 [phone]

## 2022-06-29 NOTE — ED Triage Notes (Signed)
Pt BIB EMS from [place] [facility]. Pt complains of body aches, fever, and congestion since midnight.

## 2022-06-29 NOTE — Progress Notes (Signed)
       Overnight   NAME: Stacey Armstrong MRN: [id number] DOB : December 23, 1927    Date of Service   06/29/2022   HPI/Events of Note    Notified by RN for family/Patient concern over code status.  Reviewed the following pertinent with family and Patient at bedside :   Patient/Family does confirm the following: CPR- YES Defibrillation- YES Pressors- YES BiPAP- YES Intubation- YES Verified information at bedside    Patient/Family CONFIRM that patient does not want prolonged ventilation/ETT.    Interventions/ Plan   Patient is now full code  Patient does not want prolonged ventilator if the need arises.   Family will bring Living Will/other paperwork in AM       Gershon Cull BSN MSNA MSN [place] Nurse Practitioner [place]

## 2022-06-29 NOTE — ED Notes (Signed)
ED TO INPATIENT HANDOFF REPORT  ED Nurse Name and Phone #: Clarise Cruz [phone]  S Name/Age/Gender Stacey Armstrong 89 y.o. female Room/Bed: WA13/WA13  Code Status   Code Status: DNR  Home/SNF/Other  Patient oriented to: self, place, time, and situation Is this baseline? Yes   Triage Complete: Triage complete  Chief Complaint Febrile illness, acute [R50.9]  Triage Note Pt BIB EMS from [place] [facility]. Pt complains of body aches, fever, and congestion since midnight.    Allergies Allergies  Allergen Reactions   Penicillins Hives   Bactrim [Sulfamethoxazole-Trimethoprim] Nausea And Vomiting   Coconut Oil Hives, Swelling and Rash   Cocos Nucifera Hives, Swelling and Rash   Other Hives, Swelling and Rash    Berries     Level of Care/Admitting Diagnosis ED Disposition     ED Disposition  Admit   Condition  --   Comment  Hospital Area: [place] [[id number]]  Level of Care: Med-Surg [16]  May place patient in observation at [facility] or [place] if equivalent level of care is available:: No  Covid Evaluation: Confirmed COVID Negative  Diagnosis: Febrile illness, acute [119147]  Admitting Physician: Jonnie Finner [[id number]]  Attending Physician: Jonnie Finner [[id number]]          B Medical/Surgery History Past Medical History:  Diagnosis Date   Arthritis    Complication of anesthesia    after gallbladder surgery with extubation had shallow respirations,  needed to continue intudation for additonal 10-12 days   Diabetes ([place])    DVT (deep venous thrombosis) (HCC)    left leg after hysterectomy   GERD (gastroesophageal reflux disease)    History of COVID-19 05/2021   History of kidney stones    History of transient ischemic attack (TIA)    HOH (hard of hearing)    Hyperlipidemia    Hypertension    Kidney stones 2023   Sleep apnea    Past Surgical History:  Procedure Laterality Date   ABDOMINAL HYSTERECTOMY      APPENDECTOMY     CATARACT EXTRACTION, BILATERAL     CHOLECYSTECTOMY     COLONOSCOPY     CYSTOSCOPY WITH STENT PLACEMENT Right 08/14/2021   Procedure: CYSTOSCOPY WITH STENT PLACEMENT;  Surgeon: Ardis Hughs, MD;  Location: [facility];  Service: Urology;  Laterality: Right;  ONLY NEEDS 30 MIN   EXTRACORPOREAL SHOCK WAVE LITHOTRIPSY Right 02/06/2022   Procedure: RIGHT EXTRACORPOREAL SHOCK WAVE LITHOTRIPSY (ESWL);  Surgeon: Ardis Hughs, MD;  Location: [facility];  Service: Urology;  Laterality: Right;     A IV Location/Drains/Wounds Patient Lines/Drains/Airways Status     Active Line/Drains/Airways     Name Placement date Placement time Site Days   Peripheral IV 06/29/22 22 G Left Antecubital 06/29/22  0635  Antecubital  less than 1   Ureteral Drain/Stent Right ureter 6 Fr. 08/14/21  0719  Right ureter  319   External Urinary Catheter 06/29/22  0609  --  less than 1   Airway 08/14/21  0610  -- 319   Incision (Closed) 08/14/21 Vagina Other (Comment) 08/14/21  0651  -- 319            Intake/Output Last 24 hours  Intake/Output Summary (Last 24 hours) at 06/29/2022 1153 Last data filed at 06/29/2022 1010 Gross per 24 hour  Intake 250 ml  Output --  Net 250 ml    Labs/Imaging Results for orders placed or performed during the hospital encounter  of 06/29/22 (from the past 48 hour(s))  Resp panel by RT-PCR (RSV, Flu A&B, Covid) Anterior Nasal Swab     Status: None   Collection Time: 06/29/22  5:51 AM   Specimen: Anterior Nasal Swab  Result Value Ref Range   SARS Coronavirus 2 by RT PCR NEGATIVE NEGATIVE    Comment: (NOTE) SARS-CoV-2 target nucleic acids are NOT DETECTED.  The SARS-CoV-2 RNA is generally detectable in upper respiratory specimens during the acute phase of infection. The lowest concentration of SARS-CoV-2 viral copies this assay can detect is 138 copies/mL. A negative result does not preclude SARS-Cov-2 infection and should not be used as  the sole basis for treatment or other patient management decisions. A negative result may occur with  improper specimen collection/handling, submission of specimen other than nasopharyngeal swab, presence of viral mutation(s) within the areas targeted by this assay, and inadequate number of viral copies(<138 copies/mL). A negative result must be combined with clinical observations, patient history, and epidemiological information. The expected result is Negative.  Fact Sheet for Patients:  EntrepreneurPulse.com.au  Fact Sheet for Healthcare Providers:  IncredibleEmployment.be  This test is no t yet approved or cleared by the [place] FDA and  has been authorized for detection and/or diagnosis of SARS-CoV-2 by FDA under an Emergency Use Authorization (EUA). This EUA will remain  in effect (meaning this test can be used) for the duration of the COVID-19 declaration under Section 564(b)(1) of the Act, 21 U.S.C.section 360bbb-3(b)(1), unless the authorization is terminated  or revoked sooner.       Influenza A by PCR NEGATIVE NEGATIVE   Influenza B by PCR NEGATIVE NEGATIVE    Comment: (NOTE) The Xpert Xpress SARS-CoV-2/FLU/RSV plus assay is intended as an aid in the diagnosis of influenza from Nasopharyngeal swab specimens and should not be used as a sole basis for treatment. Nasal washings and aspirates are unacceptable for Xpert Xpress SARS-CoV-2/FLU/RSV testing.  Fact Sheet for Patients: EntrepreneurPulse.com.au  Fact Sheet for Healthcare Providers: IncredibleEmployment.be  This test is not yet approved or cleared by the [place] FDA and has been authorized for detection and/or diagnosis of SARS-CoV-2 by FDA under an Emergency Use Authorization (EUA). This EUA will remain in effect (meaning this test can be used) for the duration of the COVID-19 declaration under Section 564(b)(1) of the Act, 21  U.S.C. section 360bbb-3(b)(1), unless the authorization is terminated or revoked.     Resp Syncytial Virus by PCR NEGATIVE NEGATIVE    Comment: (NOTE) Fact Sheet for Patients: EntrepreneurPulse.com.au  Fact Sheet for Healthcare Providers: IncredibleEmployment.be  This test is not yet approved or cleared by the [place] FDA and has been authorized for detection and/or diagnosis of SARS-CoV-2 by FDA under an Emergency Use Authorization (EUA). This EUA will remain in effect (meaning this test can be used) for the duration of the COVID-19 declaration under Section 564(b)(1) of the Act, 21 U.S.C. section 360bbb-3(b)(1), unless the authorization is terminated or revoked.  Performed at [facility], [place] [address], [place], [place] [address]   Basic metabolic panel     Status: Abnormal   Collection Time: 06/29/22  6:41 AM  Result Value Ref Range   Sodium 135 135 - 145 mmol/L   Potassium 3.9 3.5 - 5.1 mmol/L   Chloride 101 98 - 111 mmol/L   CO2 22 22 - 32 mmol/L   Glucose, Bld 180 (H) 70 - 99 mg/dL    Comment: Glucose reference range applies only to samples taken  after fasting for at least 8 hours.   BUN 22 8 - 23 mg/dL   Creatinine, Ser 1.27 (H) 0.44 - 1.00 mg/dL   Calcium 8.9 8.9 - 10.3 mg/dL   GFR, Estimated 40 (L) >60 mL/min    Comment: (NOTE) Calculated using the CKD-EPI Creatinine Equation (2021)    Anion gap 12 5 - 15    Comment: Performed at [facility], [place] [address], [place], [place] [address]  CBC with Differential     Status: Abnormal   Collection Time: 06/29/22  6:41 AM  Result Value Ref Range   WBC 17.5 (H) 4.0 - 10.5 K/uL   RBC 4.19 3.87 - 5.11 MIL/uL   Hemoglobin 11.4 (L) 12.0 - 15.0 g/dL   HCT 36.3 36.0 - 46.0 %   MCV 86.6 80.0 - 100.0 fL   MCH 27.2 26.0 - 34.0 pg   MCHC 31.4 30.0 - 36.0 g/dL   RDW 15.3 11.5 - 15.5 %   Platelets 236 150 - 400 K/uL   nRBC 0.0 0.0 - 0.2 %    Neutrophils Relative % 86 %   Neutro Abs 15.0 (H) 1.7 - 7.7 K/uL   Lymphocytes Relative 4 %   Lymphs Abs 0.7 0.7 - 4.0 K/uL   Monocytes Relative 9 %   Monocytes Absolute 1.5 (H) 0.1 - 1.0 K/uL   Eosinophils Relative 0 %   Eosinophils Absolute 0.1 0.0 - 0.5 K/uL   Basophils Relative 0 %   Basophils Absolute 0.1 0.0 - 0.1 K/uL   Immature Granulocytes 1 %   Abs Immature Granulocytes 0.14 (H) 0.00 - 0.07 K/uL    Comment: Performed at [facility], [place] [address] [place], [place] [address]  Procalcitonin - Baseline     Status: None   Collection Time: 06/29/22  6:41 AM  Result Value Ref Range   Procalcitonin <0.10 ng/mL    Comment:        Interpretation: PCT (Procalcitonin) <= 0.5 ng/mL: Systemic infection (sepsis) is not likely. Local bacterial infection is possible. (NOTE)       Sepsis PCT Algorithm           Lower Respiratory Tract                                      Infection PCT Algorithm    ----------------------------     ----------------------------         PCT < 0.25 ng/mL                PCT < 0.10 ng/mL          Strongly encourage             Strongly discourage   discontinuation of antibiotics    initiation of antibiotics    ----------------------------     -----------------------------       PCT 0.25 - 0.50 ng/mL            PCT 0.10 - 0.25 ng/mL               OR       >80% decrease in PCT            Discourage initiation of  antibiotics      Encourage discontinuation           of antibiotics    ----------------------------     -----------------------------         PCT >= 0.50 ng/mL              PCT 0.26 - 0.50 ng/mL               AND        <80% decrease in PCT             Encourage initiation of                                             antibiotics       Encourage continuation           of antibiotics    ----------------------------     -----------------------------        PCT >= 0.50 ng/mL                   PCT > 0.50 ng/mL               AND         increase in PCT                  Strongly encourage                                      initiation of antibiotics    Strongly encourage escalation           of antibiotics                                     -----------------------------                                           PCT <= 0.25 ng/mL                                                 OR                                        > 80% decrease in PCT                                      Discontinue / Do not initiate                                             antibiotics  Performed at [place] [address], [place], [place] [address]   Urinalysis, Routine w reflex microscopic -Urine, Clean Catch  Status: Abnormal   Collection Time: 06/29/22  9:09 AM  Result Value Ref Range   Color, Urine YELLOW YELLOW   APPearance HAZY (A) CLEAR   Specific Gravity, Urine 1.018 1.005 - 1.030   pH 5.0 5.0 - 8.0   Glucose, UA 50 (A) NEGATIVE mg/dL   Hgb urine dipstick NEGATIVE NEGATIVE   Bilirubin Urine NEGATIVE NEGATIVE   Ketones, ur NEGATIVE NEGATIVE mg/dL   Protein, ur >=300 (A) NEGATIVE mg/dL   Nitrite NEGATIVE NEGATIVE   Leukocytes,Ua NEGATIVE NEGATIVE   RBC / HPF 0-5 0 - 5 RBC/hpf   WBC, UA 6-10 0 - 5 WBC/hpf   Bacteria, UA RARE (A) NONE SEEN   Squamous Epithelial / HPF 0-5 0 - 5 /HPF   Mucus PRESENT    Hyaline Casts, UA PRESENT     Comment: Performed at [facility], [place] [address], [place], [place] [address]   DG Chest Port 1 View  Result Date: 06/29/2022 CLINICAL DATA:  Fever with body aches EXAM: PORTABLE CHEST 1 VIEW COMPARISON:  03/10/2022 FINDINGS: Low volume chest with interstitial coarsening at the bases, similar to priors. Aeration is better than the most recent comparison. No edema, effusion, or pneumothorax. Normal heart size. IMPRESSION: Chronic interstitial opacity at the bases.  No focal pneumonia. Electronically Signed    By: Jorje Guild M.D.   On: 06/29/2022 07:42    Pending Labs Unresulted Labs (From admission, onward)     Start     Ordered   06/29/22 1104  Legionella Pneumophila Serogp 1 Ur Ag  (COPD / Pneumonia / Cellulitis / Lower Extremity Wound)  Once,   R        06/29/22 1103   06/29/22 1104  Strep pneumoniae urinary antigen  (COPD / Pneumonia / Cellulitis / Lower Extremity Wound)  Once,   R        06/29/22 1103   06/29/22 1053  Culture, blood (Routine X 2) w Reflex to ID Panel  BLOOD CULTURE X 2,   R      06/29/22 1052   06/29/22 1035  Respiratory (~20 pathogens) panel by PCR  (Respiratory panel by PCR (~20 pathogens, ~24 hr TAT)  w precautions)  Once,   URGENT        06/29/22 1034   Signed and Held  Hemoglobin A1c  Once,   R       Comments: To assess prior glycemic control    Signed and Held   Signed and Held  Comprehensive metabolic panel  Tomorrow morning,   R        Signed and Held   Signed and Held  CBC  Tomorrow morning,   R        Signed and Held            Vitals/Pain Today's Vitals   06/29/22 0800 06/29/22 0900 06/29/22 1000 06/29/22 1037  BP: (!) 144/44 (!) 157/52 (!) 154/52   Pulse: 63 63 60   Resp: $Remo'16 18 14   'bfkjS$ Temp:  (!) 101.1 F (38.4 C)    TempSrc:  Rectal    SpO2: 97% 96% 95%   PainSc:    5     Isolation Precautions Droplet precaution  Medications Medications  insulin aspart (novoLOG) injection 0-9 Units (has no administration in time range)  insulin aspart (novoLOG) injection 0-5 Units (has no administration in time range)  acetaminophen (TYLENOL) tablet 650 mg (has no administration in time range)    Or  acetaminophen (  TYLENOL) suppository 650 mg (has no administration in time range)  oxyCODONE (Oxy IR/ROXICODONE) immediate release tablet 5 mg (has no administration in time range)  ondansetron (ZOFRAN) tablet 4 mg (has no administration in time range)    Or  ondansetron (ZOFRAN) injection 4 mg (has no administration in time range)  docusate sodium  (COLACE) capsule 100 mg (has no administration in time range)  lactated ringers bolus 250 mL (0 mLs Intravenous Stopped 06/29/22 1010)  cefTRIAXone (ROCEPHIN) 1 g in sodium chloride 0.9 % 100 mL IVPB (1 g Intravenous New Bag/Given 06/29/22 1030)  doxycycline (VIBRA-TABS) tablet 100 mg (100 mg Oral Given 06/29/22 1031)    Mobility walks with device     Focused Assessments    R Recommendations: See Admitting Provider Note  Report given to:   Additional Notes:

## 2022-06-29 NOTE — H&P (Signed)
History and Physical    PatientMarland Kitchen Stacey Armstrong RVI:[id number] DOB: [birth year] DOA: 06/29/2022 DOS: the patient was seen and examined on 06/29/2022 PCP: [name], Rayford Halsted, MD  Patient coming from: Home  Chief Complaint:  Chief Complaint  Patient presents with   Nasal Congestion   Fever   Generalized Body Aches   HPI: Stacey Armstrong is a 89 y.o. female with medical history significant of CKD3b, COPD, CVA, DM2, HTN, HLD. Presenting with fevers, chills and body aches. She reports that she was in her normal state of health until around midnight last night. She woke with chills and aches. She's had some sneezing and sinus congestion, but no specific cough. She hasn't had any sick contacts. She called her family for help this morning around 4am when her symptoms did not improve. They decided to bring her to the hospital for assistance. She denies any other aggravating or alleviating factors.   Review of Systems: As mentioned in the history of present illness. All other systems reviewed and are negative. Past Medical History:  Diagnosis Date   Arthritis    Complication of anesthesia    after gallbladder surgery with extubation had shallow respirations,  needed to continue intudation for additonal 10-12 days   Diabetes ([place])    DVT (deep venous thrombosis) (HCC)    left leg after hysterectomy   GERD (gastroesophageal reflux disease)    History of COVID-19 05/2021   History of kidney stones    History of transient ischemic attack (TIA)    HOH (hard of hearing)    Hyperlipidemia    Hypertension    Kidney stones 2023   Sleep apnea    Past Surgical History:  Procedure Laterality Date   ABDOMINAL HYSTERECTOMY     APPENDECTOMY     CATARACT EXTRACTION, BILATERAL     CHOLECYSTECTOMY     COLONOSCOPY     CYSTOSCOPY WITH STENT PLACEMENT Right 08/14/2021   Procedure: CYSTOSCOPY WITH STENT PLACEMENT;  Surgeon: Ardis Hughs, MD;  Location: [facility];  Service: Urology;  Laterality:  Right;  ONLY NEEDS 30 MIN   EXTRACORPOREAL SHOCK WAVE LITHOTRIPSY Right 02/06/2022   Procedure: RIGHT EXTRACORPOREAL SHOCK WAVE LITHOTRIPSY (ESWL);  Surgeon: Ardis Hughs, MD;  Location: [facility];  Service: Urology;  Laterality: Right;   Social History:  reports that she has quit smoking. She has never used smokeless tobacco. She reports that she does not currently use alcohol. She reports that she does not use drugs.  Allergies  Allergen Reactions   Coconut Oil    Cocos Nucifera Hives   Penicillins Hives   Bactrim [Sulfamethoxazole-Trimethoprim] Nausea And Vomiting   Other Hives    Berries     Family History  Problem Relation Age of Onset   CAD Maternal Grandmother    Heart disease Maternal Grandmother    Pneumonia Maternal Grandfather    Liver disease Neg Hx    Colon cancer Neg Hx    Esophageal cancer Neg Hx     Prior to Admission medications   Medication Sig Start Date End Date Taking? Authorizing Provider  Accu-Chek FastClix Lancets MISC USE AS DIRECTED 04/07/22   [name], Rayford Halsted, MD  albuterol (VENTOLIN HFA) 108 (90 Base) MCG/ACT inhaler Inhale 2 puffs into the lungs every 6 (six) hours as needed for wheezing or shortness of breath. 09/09/21   Oswald Hillock, MD  Alcohol Swabs (B-D SINGLE USE SWABS REGULAR) PADS USE AS DIRECTED EVERY DAY 09/11/20   Jerilee Hoh  Everardo Beals, MD  amitriptyline (ELAVIL) 25 MG tablet Take 1 tablet every third night Patient taking differently: Take 25 mg by mouth See admin instructions. Take 25mg  by mouth every fourth night 08/26/21   [name], Rayford Halsted, MD  amLODipine (NORVASC) 10 MG tablet Take 1 tablet (10 mg total) by mouth daily. 03/13/22   Shalhoub, Sherryll Burger, MD  aspirin EC 81 MG tablet Take 81 mg by mouth in the morning.    [provider]  atorvastatin (LIPITOR) 20 MG tablet Take 1 tablet (20 mg total) by mouth at bedtime. 02/24/22   [name], Rayford Halsted, MD  bisoprolol (ZEBETA) 5 MG  tablet TAKE 1 TABLET EVERY DAY 12/30/21   [name], Rayford Halsted, MD  Blood Glucose Monitoring Suppl (TRUE METRIX METER) DEVI 1 each by Does not apply route daily. 09/28/20   [name], Rayford Halsted, MD  budesonide (PULMICORT) 0.25 MG/2ML nebulizer solution Inhale into the lungs. 09/09/21   [provider]  Cholecalciferol (VITAMIN D3) 50 MCG (2000 UT) TABS Take 2,000 Units by mouth in the morning.    [provider]  furosemide (LASIX) 20 MG tablet Take 1 tablet (20 mg total) by mouth daily. 05/28/22 08/26/22  [name], Rayford Halsted, MD  Multiple Vitamin (MULTIVITAMIN WITH MINERALS) TABS tablet Take 1 tablet by mouth daily. Centrum Silver    [provider]  Multiple Vitamins-Minerals (PRESERVISION AREDS 2 PO) Take 1 tablet by mouth in the morning and at bedtime.    [provider]  mupirocin ointment (BACTROBAN) 2 % Apply 1 Application topically 2 (two) times daily as needed (anti-fungal bacteria).    [provider]  Omega-3 Fatty Acids (FISH OIL) 1000 MG CAPS Take 2,000 mg by mouth daily.    [provider]  Polyethyl Glycol-Propyl Glycol (SYSTANE ULTRA) 0.4-0.3 % SOLN Place 1-2 drops into both eyes 3 (three) times daily as needed (dry/irritated eyes.).    [provider]  polyethylene glycol powder (GLYCOLAX/MIRALAX) 17 GM/SCOOP powder Take 17 g by mouth 2 (two) times daily as needed. Patient taking differently: Take 17 g by mouth in the morning and at bedtime. 12/30/18   [name], Rayford Halsted, MD  potassium chloride SA (KLOR-CON M) 20 MEQ tablet Take 1 tablet (20 mEq total) by mouth daily. 05/28/22   [name], Rayford Halsted, MD  Probiotic Product (PROBIOTIC PO) Take 1 capsule by mouth in the morning.    [provider]  senna-docusate (SENOKOT-S) 8.6-50 MG tablet Take 1 tablet by mouth 2 (two) times daily. 03/12/22   Shalhoub, Sherryll Burger, MD  TRUE METRIX BLOOD GLUCOSE TEST test strip TEST BLOOD SUGAR EVERY DAY 03/10/22    [name], Rayford Halsted, MD    Physical Exam: Vitals:   06/29/22 0645 06/29/22 0800 06/29/22 0900 06/29/22 1000  BP: (!) 169/52 (!) 144/44 (!) 157/52 (!) 154/52  Pulse: 62 63 63 60  Resp: 17 16 18 14   Temp:   (!) 101.1 F (38.4 C)   TempSrc:   Rectal   SpO2: 93% 97% 96% 95%   General: 89 y.o. female resting in bed in NAD Eyes: PERRL, normal sclera ENMT: Nares patent w/o discharge, orophaynx clear, dentition normal, ears w/o discharge/lesions/ulcers Neck: Supple, trachea midline Cardiovascular: RRR, +S1, S2, no m/g/r, equal pulses throughout Respiratory: CTABL, no w/r/r, normal WOB GI: BS+, NDNT, no masses noted, no organomegaly noted MSK: No e/c/c Neuro: A&O x 3, no focal deficits, no photophobia/nuchal rigidity Psyc: Appropriate interaction and affect, calm/cooperative  Data Reviewed:  Results for orders placed or performed during the hospital encounter of 06/29/22 (from the past 24 hour(s))  Resp panel by RT-PCR (RSV, Flu A&B, Covid) Anterior Nasal Swab     Status: None   Collection Time: 06/29/22  5:51 AM   Specimen: Anterior Nasal Swab  Result Value Ref Range   SARS Coronavirus 2 by RT PCR NEGATIVE NEGATIVE   Influenza A by PCR NEGATIVE NEGATIVE   Influenza B by PCR NEGATIVE NEGATIVE   Resp Syncytial Virus by PCR NEGATIVE NEGATIVE  Basic metabolic panel     Status: Abnormal   Collection Time: 06/29/22  6:41 AM  Result Value Ref Range   Sodium 135 135 - 145 mmol/L   Potassium 3.9 3.5 - 5.1 mmol/L   Chloride 101 98 - 111 mmol/L   CO2 22 22 - 32 mmol/L   Glucose, Bld 180 (H) 70 - 99 mg/dL   BUN 22 8 - 23 mg/dL   Creatinine, Ser 1.27 (H) 0.44 - 1.00 mg/dL   Calcium 8.9 8.9 - 10.3 mg/dL   GFR, Estimated 40 (L) >60 mL/min   Anion gap 12 5 - 15  CBC with Differential     Status: Abnormal   Collection Time: 06/29/22  6:41 AM  Result Value Ref Range   WBC 17.5 (H) 4.0 - 10.5 K/uL   RBC 4.19 3.87 - 5.11 MIL/uL   Hemoglobin 11.4 (L) 12.0 - 15.0 g/dL   HCT 36.3 36.0  - 46.0 %   MCV 86.6 80.0 - 100.0 fL   MCH 27.2 26.0 - 34.0 pg   MCHC 31.4 30.0 - 36.0 g/dL   RDW 15.3 11.5 - 15.5 %   Platelets 236 150 - 400 K/uL   nRBC 0.0 0.0 - 0.2 %   Neutrophils Relative % 86 %   Neutro Abs 15.0 (H) 1.7 - 7.7 K/uL   Lymphocytes Relative 4 %   Lymphs Abs 0.7 0.7 - 4.0 K/uL   Monocytes Relative 9 %   Monocytes Absolute 1.5 (H) 0.1 - 1.0 K/uL   Eosinophils Relative 0 %   Eosinophils Absolute 0.1 0.0 - 0.5 K/uL   Basophils Relative 0 %   Basophils Absolute 0.1 0.0 - 0.1 K/uL   Immature Granulocytes 1 %   Abs Immature Granulocytes 0.14 (H) 0.00 - 0.07 K/uL  Urinalysis, Routine w reflex microscopic -Urine, Clean Catch     Status: Abnormal   Collection Time: 06/29/22  9:09 AM  Result Value Ref Range   Color, Urine YELLOW YELLOW   APPearance HAZY (A) CLEAR   Specific Gravity, Urine 1.018 1.005 - 1.030   pH 5.0 5.0 - 8.0   Glucose, UA 50 (A) NEGATIVE mg/dL   Hgb urine dipstick NEGATIVE NEGATIVE   Bilirubin Urine NEGATIVE NEGATIVE   Ketones, ur NEGATIVE NEGATIVE mg/dL   Protein, ur >=300 (A) NEGATIVE mg/dL   Nitrite NEGATIVE NEGATIVE   Leukocytes,Ua NEGATIVE NEGATIVE   RBC / HPF 0-5 0 - 5 RBC/hpf   WBC, UA 6-10 0 - 5 WBC/hpf   Bacteria, UA RARE (A) NONE SEEN   Squamous Epithelial / HPF 0-5 0 - 5 /HPF   Mucus PRESENT    Hyaline Casts, UA PRESENT    CXR: Chronic interstitial opacity at the bases. No focal pneumonia.   Assessment and Plan: Febrile illness SIRS     - place in obs, med-surg     - she's had some sinus symptoms; maybe a bacterial sinusitis?     -  she was started on rocephin/doxy for PNA; can continue for now     - COVID/flu/RSV are negative; check RVP and procal     - follow blood Cx     - UA negative  HTN      - continue home regimen when confirmed  HLD     - continue home regimen when confirmed  Hx of CVA     - continue home regimen when confirmed  CKD3b     - at baseline, trend  DM2     - A1c, SSI, glucose checks, DM  diet  COPD     - continue home regimen when confirmed  Advance Care Planning:   Code Status: DNR  Consults: None  Family Communication: w/ dtr at bedside  Severity of Illness: The appropriate patient status for this patient is OBSERVATION. Observation status is judged to be reasonable and necessary in order to provide the required intensity of service to ensure the patient's safety. The patient's presenting symptoms, physical exam findings, and initial radiographic and laboratory data in the context of their medical condition is felt to place them at decreased risk for further clinical deterioration. Furthermore, it is anticipated that the patient will be medically stable for discharge from the hospital within 2 midnights of admission.   Author: Jonnie Finner, DO 06/29/2022 10:34 AM  For on call review [email or web address].

## 2022-06-30 DIAGNOSIS — E785 Hyperlipidemia, unspecified: Secondary | ICD-10-CM

## 2022-06-30 DIAGNOSIS — E1169 Type 2 diabetes mellitus with other specified complication: Secondary | ICD-10-CM | POA: Diagnosis not present

## 2022-06-30 DIAGNOSIS — J449 Chronic obstructive pulmonary disease, unspecified: Secondary | ICD-10-CM | POA: Diagnosis not present

## 2022-06-30 DIAGNOSIS — I10 Essential (primary) hypertension: Secondary | ICD-10-CM | POA: Diagnosis not present

## 2022-06-30 DIAGNOSIS — N1832 Chronic kidney disease, stage 3b: Secondary | ICD-10-CM

## 2022-06-30 DIAGNOSIS — Z8673 Personal history of transient ischemic attack (TIA), and cerebral infarction without residual deficits: Secondary | ICD-10-CM | POA: Diagnosis not present

## 2022-06-30 DIAGNOSIS — R509 Fever, unspecified: Secondary | ICD-10-CM | POA: Diagnosis not present

## 2022-06-30 LAB — COMPREHENSIVE METABOLIC PANEL
ALT: 17 U/L (ref 0–44)
AST: 19 U/L (ref 15–41)
Albumin: 2.7 g/dL — ABNORMAL LOW (ref 3.5–5.0)
Alkaline Phosphatase: 72 U/L (ref 38–126)
Anion gap: 10 (ref 5–15)
BUN: 18 mg/dL (ref 8–23)
CO2: 24 mmol/L (ref 22–32)
Calcium: 8.5 mg/dL — ABNORMAL LOW (ref 8.9–10.3)
Chloride: 101 mmol/L (ref 98–111)
Creatinine, Ser: 1.09 mg/dL — ABNORMAL HIGH (ref 0.44–1.00)
GFR, Estimated: 48 mL/min — ABNORMAL LOW (ref >60)
Glucose, Bld: 134 mg/dL — ABNORMAL HIGH (ref 70–99)
Potassium: 4 mmol/L (ref 3.5–5.1)
Sodium: 135 mmol/L (ref 135–145)
Total Bilirubin: 1 mg/dL (ref 0.3–1.2)
Total Protein: 5.5 g/dL — ABNORMAL LOW (ref 6.5–8.1)

## 2022-06-30 LAB — CBC
HCT: 31.8 % — ABNORMAL LOW (ref 36.0–46.0)
Hemoglobin: 10.3 g/dL — ABNORMAL LOW (ref 12.0–15.0)
MCH: 28.5 pg (ref 26.0–34.0)
MCHC: 32.4 g/dL (ref 30.0–36.0)
MCV: 87.8 fL (ref 80.0–100.0)
Platelets: 206 10*3/uL (ref 150–400)
RBC: 3.62 MIL/uL — ABNORMAL LOW (ref 3.87–5.11)
RDW: 15.4 % (ref 11.5–15.5)
WBC: 13.5 10*3/uL — ABNORMAL HIGH (ref 4.0–10.5)
nRBC: 0 % (ref 0.0–0.2)

## 2022-06-30 LAB — GLUCOSE, CAPILLARY
Glucose-Capillary: 137 mg/dL — ABNORMAL HIGH (ref 70–99)
Glucose-Capillary: 148 mg/dL — ABNORMAL HIGH (ref 70–99)
Glucose-Capillary: 89 mg/dL (ref 70–99)
Glucose-Capillary: 146 mg/dL — ABNORMAL HIGH (ref 70–99)

## 2022-06-30 MED ORDER — AMLODIPINE BESYLATE 10 MG PO TABS
10.0000 mg | ORAL_TABLET | Freq: Every day | ORAL | Status: DC
  Administered 2022-06-30 – 2022-07-03 (×4): 10 mg via ORAL
  Filled 2022-06-30 (×4): qty 1

## 2022-06-30 MED ORDER — ASPIRIN-ACETAMINOPHEN-CAFFEINE 250-250-65 MG PO TABS: 1.0000 | ORAL_TABLET | Freq: Every day | ORAL | Status: DC | PRN

## 2022-06-30 MED ORDER — IPRATROPIUM-ALBUTEROL 0.5-2.5 (3) MG/3ML IN SOLN
3.0000 mL | Freq: Three times a day (TID) | RESPIRATORY_TRACT | Status: DC
  Administered 2022-06-30 – 2022-07-02 (×6): 3 mL via RESPIRATORY_TRACT
  Filled 2022-06-30 (×6): qty 3

## 2022-06-30 MED ORDER — POLYVINYL ALCOHOL 1.4 % OP SOLN: 1.0000 [drp] | Freq: Three times a day (TID) | OPHTHALMIC | Status: DC | PRN

## 2022-06-30 MED ORDER — ASPIRIN 81 MG PO TBEC
81.0000 mg | DELAYED_RELEASE_TABLET | Freq: Every morning | ORAL | Status: DC
  Administered 2022-06-30 – 2022-07-03 (×4): 81 mg via ORAL
  Filled 2022-06-30 (×4): qty 1

## 2022-06-30 MED ORDER — GUAIFENESIN ER 600 MG PO TB12
600.0000 mg | ORAL_TABLET | Freq: Two times a day (BID) | ORAL | Status: DC
  Administered 2022-06-30 – 2022-07-03 (×7): 600 mg via ORAL
  Filled 2022-06-30 (×7): qty 1

## 2022-06-30 MED ORDER — POLYETHYLENE GLYCOL 3350 17 G PO PACK
17.0000 g | PACK | Freq: Every day | ORAL | Status: DC
  Administered 2022-06-30 – 2022-07-01 (×2): 17 g via ORAL
  Filled 2022-06-30: qty 1

## 2022-06-30 MED ORDER — FLUTICASONE PROPIONATE 50 MCG/ACT NA SUSP
2.0000 | Freq: Every day | NASAL | Status: DC
  Administered 2022-07-01 – 2022-07-03 (×3): 2 via NASAL
  Filled 2022-06-30: qty 16

## 2022-06-30 MED ORDER — SENNOSIDES-DOCUSATE SODIUM 8.6-50 MG PO TABS
1.0000 | ORAL_TABLET | Freq: Two times a day (BID) | ORAL | Status: DC
  Administered 2022-06-30 – 2022-07-02 (×5): 1 via ORAL
  Filled 2022-06-30 (×5): qty 1

## 2022-06-30 MED ORDER — PANTOPRAZOLE SODIUM 40 MG PO TBEC
40.0000 mg | DELAYED_RELEASE_TABLET | Freq: Every day | ORAL | Status: DC
  Administered 2022-06-30 – 2022-07-03 (×4): 40 mg via ORAL
  Filled 2022-06-30 (×4): qty 1

## 2022-06-30 MED ORDER — POLYETHYL GLYCOL-PROPYL GLYCOL 0.4-0.3 % OP SOLN: 1.0000 [drp] | Freq: Three times a day (TID) | OPHTHALMIC | Status: DC | PRN

## 2022-06-30 MED ORDER — MOMETASONE FURO-FORMOTEROL FUM 200-5 MCG/ACT IN AERO
2.0000 | INHALATION_SPRAY | Freq: Two times a day (BID) | RESPIRATORY_TRACT | Status: DC
  Administered 2022-06-30 – 2022-07-02 (×5): 2 via RESPIRATORY_TRACT
  Filled 2022-06-30: qty 8.8

## 2022-06-30 MED ORDER — BISOPROLOL FUMARATE 5 MG PO TABS
5.0000 mg | ORAL_TABLET | Freq: Every day | ORAL | Status: DC
  Administered 2022-06-30 – 2022-07-03 (×4): 5 mg via ORAL
  Filled 2022-06-30 (×4): qty 1

## 2022-06-30 MED ORDER — LORATADINE 10 MG PO TABS
10.0000 mg | ORAL_TABLET | Freq: Every day | ORAL | Status: DC
  Administered 2022-06-30 – 2022-07-03 (×4): 10 mg via ORAL
  Filled 2022-06-30 (×4): qty 1

## 2022-06-30 NOTE — TOC Initial Note (Signed)
Transition of Care [facility]) - Initial/Assessment Note    Patient Details  Name: Stacey Armstrong MRN: [id number] Date of Birth: 13-Oct-1927  [facility] CM/SW Contact:    Vassie Moselle, LCSW Phone Number: 06/30/2022, 10:51 AM  Clinical Narrative:                 Met with pt and daughter in room. Pt/daughter share that pt currently resides at [facility]. She has a rollator, shower seat, and grab bars and denies further DME needs. Pt currently receives [facility] with [facility]. Pt's daughter is to transport pt home at discharge.  TOC will continue to follow for discharge needs.    Expected Discharge Plan: [place] Barriers to Discharge: Continued Medical Work up   Patient Goals and CMS Choice Patient states their goals for this hospitalization and ongoing recovery are:: To go home CMS Medicare.gov Compare Post Acute Care list provided to:: Patient Choice offered to / list presented to : Patient, Adult Children      Expected Discharge Plan and Services In-house Referral: NA Discharge Planning Services: NA Post Acute Care Choice: [place] arrangements for the past 2 months: [place]                 DME Arranged: N/A DME Agency: NA                  Prior Living Arrangements/Services Living arrangements for the past 2 months: [place] Lives with:: Self Patient language and need for interpreter reviewed:: Yes Do you feel safe going back to the place where you live?: Yes      Need for Family Participation in Patient Care: No (Comment) Care giver support system in place?: Yes (comment) Current home services: DME, Home PT (rollator, grab bars, shower seat) Criminal Activity/Legal Involvement Pertinent to Current Situation/Hospitalization: No - Comment as needed  Activities of Daily Living Home Assistive Devices/Equipment: Walker (specify type) (rolling) ADL Screening (condition at time of  admission) Patient's cognitive ability adequate to safely complete daily activities?: Yes Is the patient deaf or have difficulty hearing?: Yes Does the patient have difficulty seeing, even when wearing glasses/contacts?: No Does the patient have difficulty concentrating, remembering, or making decisions?: No Patient able to express need for assistance with ADLs?: Yes Does the patient have difficulty dressing or bathing?: Yes Independently performs ADLs?: No Communication: Independent Dressing (OT): Needs assistance Grooming: Independent Feeding: Independent Bathing: Needs assistance Is this a change from baseline?: Change from baseline, expected to last <3 days Toileting: Needs assistance Is this a change from baseline?: Change from baseline, expected to last <3 days In/Out Bed: Needs assistance Is this a change from baseline?: Change from baseline, expected to last <3 days Walks in Home: Independent with device (comment) Does the patient have difficulty walking or climbing stairs?: Yes Weakness of Legs: None Weakness of Arms/Hands: None  Permission Sought/Granted Permission sought to share information with : Facility Sport and exercise psychologist, Family Supports Permission granted to share information with : Yes, Verbal Permission Granted, Yes, Release of Information Signed  Share Information with NAME: Lavina Hamman  Permission granted to share info w AGENCY: [facility]  Permission granted to share info w Relationship: Daughter  Permission granted to share info w Contact Information: [phone]  Emotional Assessment Appearance:: Appears younger than stated age Attitude/Demeanor/Rapport: Engaged Affect (typically observed): Accepting Orientation: : Oriented to Self, Oriented to Place, Oriented to  Time, Oriented to Situation Alcohol / Substance Use:  Not Applicable Psych Involvement: No (comment)  Admission diagnosis:  Febrile illness, acute [R50.9] Influenza-like illness  [J11.1] Normochromic normocytic anemia [D64.9] Community acquired pneumonia, unspecified laterality [J18.9] Patient Active Problem List   Diagnosis Date Noted   Febrile illness, acute 06/29/2022   SIRS (systemic inflammatory response syndrome) ([place]) 06/29/2022   Stage 3b chronic kidney disease (CKD) ([place]) 06/29/2022   COPD (chronic obstructive pulmonary disease) ([place]) 06/29/2022   Acute on chronic diastolic CHF (congestive heart failure) ([place]) 03/12/2022   Acute respiratory failure with hypoxia ([place]) 03/09/2022   Bilateral pleural effusion 03/08/2022   Hypertensive urgency 03/08/2022   History of CVA (cerebrovascular accident) 03/08/2022   Acute respiratory failure ([place]) 09/07/2021   Acute viral bronchitis 09/06/2021   CKD (chronic kidney disease) stage 4, GFR 15-29 ml/min ([place]) 09/06/2021   Pulmonary nodule 09/06/2021   Weakness 09/06/2021   Bronchitis 09/05/2021   AKI (acute kidney injury) ([place]) 08/20/2021   Hyperlipidemia 08/20/2021   Hyponatremia 08/20/2021   Anemia in chronic renal disease 08/20/2021   Urolithiasis 08/20/2021   Mild protein malnutrition ([place]) 08/20/2021   Contact dermatitis 07/10/2020   Presbycusis of both ears 07/10/2020   Vasomotor rhinitis 07/10/2020   Fecal impaction ([place]) 12/07/2018   Type 2 diabetes mellitus with hyperlipidemia ([place]) 12/07/2018   Essential hypertension 12/07/2018   Diarrhea 12/07/2018   Nausea and vomiting 12/07/2018   ARF (acute renal failure) ([place]) 12/07/2018   PCP:  [name], Rayford Halsted, MD Pharmacy:   [facility], [place] [address] Phone: [phone] Fax: [phone]  [id number] - [place], [address] [place] [address] Phone: [phone] Fax: [phone]     Social Determinants of Health (SDOH) Social History: Lazy Y U: No Food Insecurity (06/29/2022)  Housing: Low Risk  (06/29/2022)  Transportation Needs: No Transportation Needs (06/29/2022)  Utilities: Not At Risk (06/29/2022)  Depression (PHQ2-9): Low Risk  (03/20/2022)  Tobacco Use: Medium Risk (06/29/2022)   SDOH Interventions: Housing Interventions: Intervention Not Indicated   Readmission Risk Interventions     No data to display

## 2022-06-30 NOTE — Progress Notes (Signed)
PROGRESS NOTE    Stacey Armstrong  QQV:[id number] DOB: [birth year] DOA: 06/29/2022 PCP: Philip Aspen, Limmie Patricia, MD    Chief Complaint  Patient presents with   Nasal Congestion   Fever   Generalized Body Aches    Brief Narrative:  Patient 89 year old female history of CKD stage IIIb, COPD, CVA, diabetes, hypertension, hyperlipidemia presented with fever chills body aches suddenly the night prior to admission with some associated sneezing, sinus congestion but no specific cough.  Patient noted to have a fever on presentation.  COVID-19 PCR negative, influenza A and B PCR negative, respiratory viral PCR negative, respiratory viral panel negative.  Patient placed on empiric IV antibiotics of Rocephin and azithromycin.  Patient pancultured.   Assessment & Plan:   Principal Problem:   Febrile illness, acute Active Problems:   Type 2 diabetes mellitus with hyperlipidemia (HCC)   Essential hypertension   Hyperlipidemia   History of CVA (cerebrovascular accident)   SIRS (systemic inflammatory response syndrome) (HCC)   Stage 3b chronic kidney disease (CKD) (HCC)   COPD (chronic obstructive pulmonary disease) (HCC)   #1 SIRS/febrile illness -Patient presenting with febrile like illness with fever with a temp of 101.1, upper respiratory symptoms, chills, body aches with sudden onset. -COVID-19 PCR negative, influenza a and B PCR negative, respiratory viral PCR negative, respiratory viral panel negative, chest x-ray with no acute abnormalities noted. -Pancultured. -Check a urine culture, blood cultures pending. -Continue empiric IV Rocephin, IV azithromycin. -Placed on Mucinex, Claritin, Flonase, PPI, scheduled DuoNebs. -Supportive care.  2.  Hypertension -Resume home regimen Norvasc, Zebeta. -SSI.  3.  History of CVA -Continue aspirin for secondary stroke prophylaxis. -Hold statin due to presentation with diffuse body aches and may resume on discharge.  4.  Hyperlipidemia -Hold  statin.  5.  CKD stage IIIb -Stable.  6.  Diabetes mellitus type 2 -Hemoglobin A1c 6.4 (06/29/2022) -CBG 137 this morning. -SSI.  7.  COPD -Placed on scheduled DuoNebs.   -Per med rec patient supposed to be on Pulmicort however states patient not taking.  -Dulera.   DVT prophylaxis: SCDs Code Status: Full(discussed and verified with patient and daughter at bedside.) Family Communication: Updated patient, daughter at bedside. Disposition: TBD.  Status is: Observation The patient remains OBS appropriate and will d/c before 2 midnights.   Consultants:  None  Procedures:  Chest x-ray 06/29/2022  Antimicrobials: IV azithromycin 06/29/2022.>>> IV Rocephin 06/29/2022>>>>   Subjective: Patient sitting up in chair.  Still feels very congested.  Overall feeling better than when she came to the hospital.  She is improved shortness of breath.  No chest pain.  Diffuse aches improving.  Daughter at bedside.  Objective: Vitals:   06/29/22 2337 06/30/22 0600 06/30/22 1407 06/30/22 1421  BP: (!) 187/58 (!) 155/61  (!) 137/53  Pulse: 61 65  (!) 55  Resp: 18 15  18   Temp: 98.9 F (37.2 C) 98.2 F (36.8 C)  98.2 F (36.8 C)  TempSrc: Oral Oral  Oral  SpO2: 95% 92% 96% 96%    Intake/Output Summary (Last 24 hours) at 06/30/2022 1613 Last data filed at 06/30/2022 1000 Gross per 24 hour  Intake 1307 ml  Output 975 ml  Net 332 ml   There were no vitals filed for this visit.  Examination:  General exam: Appears calm and comfortable.  Congested. Respiratory system: Lungs clear to auscultation bilaterally.  No wheezes, no crackles, no rhonchi noted.   Cardiovascular system: S1 & S2 heard, RRR. No JVD, murmurs, rubs, gallops  or clicks. No pedal edema. Gastrointestinal system: Abdomen is nondistended, soft and nontender. No organomegaly or masses felt. Normal bowel sounds heard. Central nervous system: Alert and oriented. No focal neurological deficits. Extremities: Symmetric 5 x 5  power. Skin: No rashes, lesions or ulcers Psychiatry: Judgement and insight appear normal. Mood & affect appropriate.     Data Reviewed: I have personally reviewed following labs and imaging studies  CBC: Recent Labs  Lab 06/29/22 0641 06/30/22 0453  WBC 17.5* 13.5*  NEUTROABS 15.0*  --   HGB 11.4* 10.3*  HCT 36.3 31.8*  MCV 86.6 87.8  PLT 236 998    Basic Metabolic Panel: Recent Labs  Lab 06/29/22 0641 06/30/22 0453  NA 135 135  K 3.9 4.0  CL 101 101  CO2 22 24  GLUCOSE 180* 134*  BUN 22 18  CREATININE 1.27* 1.09*  CALCIUM 8.9 8.5*    GFR: CrCl cannot be calculated (Unknown ideal weight.).  Liver Function Tests: Recent Labs  Lab 06/30/22 0453  AST 19  ALT 17  ALKPHOS 72  BILITOT 1.0  PROT 5.5*  ALBUMIN 2.7*    CBG: Recent Labs  Lab 06/29/22 1206 06/29/22 1616 06/29/22 2100 06/30/22 0746 06/30/22 1157  GLUCAP 136* 117* 137* 137* 148*     Recent Results (from the past 240 hour(s))  Resp panel by RT-PCR (RSV, Flu A&B, Covid) Anterior Nasal Swab     Status: None   Collection Time: 06/29/22  5:51 AM   Specimen: Anterior Nasal Swab  Result Value Ref Range Status   SARS Coronavirus 2 by RT PCR NEGATIVE NEGATIVE Final    Comment: (NOTE) SARS-CoV-2 target nucleic acids are NOT DETECTED.  The SARS-CoV-2 RNA is generally detectable in upper respiratory specimens during the acute phase of infection. The lowest concentration of SARS-CoV-2 viral copies this assay can detect is 138 copies/mL. A negative result does not preclude SARS-Cov-2 infection and should not be used as the sole basis for treatment or other patient management decisions. A negative result may occur with  improper specimen collection/handling, submission of specimen other than nasopharyngeal swab, presence of viral mutation(s) within the areas targeted by this assay, and inadequate number of viral copies(<138 copies/mL). A negative result must be combined with clinical  observations, patient history, and epidemiological information. The expected result is Negative.  Fact Sheet for Patients:  EntrepreneurPulse.com.au  Fact Sheet for Healthcare Providers:  IncredibleEmployment.be  This test is no t yet approved or cleared by the [place] FDA and  has been authorized for detection and/or diagnosis of SARS-CoV-2 by FDA under an Emergency Use Authorization (EUA). This EUA will remain  in effect (meaning this test can be used) for the duration of the COVID-19 declaration under Section 564(b)(1) of the Act, 21 U.S.C.section 360bbb-3(b)(1), unless the authorization is terminated  or revoked sooner.       Influenza A by PCR NEGATIVE NEGATIVE Final   Influenza B by PCR NEGATIVE NEGATIVE Final    Comment: (NOTE) The Xpert Xpress SARS-CoV-2/FLU/RSV plus assay is intended as an aid in the diagnosis of influenza from Nasopharyngeal swab specimens and should not be used as a sole basis for treatment. Nasal washings and aspirates are unacceptable for Xpert Xpress SARS-CoV-2/FLU/RSV testing.  Fact Sheet for Patients: EntrepreneurPulse.com.au  Fact Sheet for Healthcare Providers: IncredibleEmployment.be  This test is not yet approved or cleared by the [place] FDA and has been authorized for detection and/or diagnosis of SARS-CoV-2 by FDA under an Emergency Use Authorization (EUA). This  EUA will remain in effect (meaning this test can be used) for the duration of the COVID-19 declaration under Section 564(b)(1) of the Act, 21 U.S.C. section 360bbb-3(b)(1), unless the authorization is terminated or revoked.     Resp Syncytial Virus by PCR NEGATIVE NEGATIVE Final    Comment: (NOTE) Fact Sheet for Patients: BloggerCourse.com  Fact Sheet for Healthcare Providers: SeriousBroker.it  This test is not yet approved or cleared by  the [place] FDA and has been authorized for detection and/or diagnosis of SARS-CoV-2 by FDA under an Emergency Use Authorization (EUA). This EUA will remain in effect (meaning this test can be used) for the duration of the COVID-19 declaration under Section 564(b)(1) of the Act, 21 U.S.C. section 360bbb-3(b)(1), unless the authorization is terminated or revoked.  Performed at [facility], [address] [place], [place] [address]   Respiratory (~20 pathogens) panel by PCR     Status: None   Collection Time: 06/29/22  6:41 AM   Specimen: Nasopharyngeal Swab; Respiratory  Result Value Ref Range Status   Adenovirus NOT DETECTED NOT DETECTED Final   Coronavirus 229E NOT DETECTED NOT DETECTED Final    Comment: (NOTE) The Coronavirus on the Respiratory Panel, DOES NOT test for the novel  Coronavirus (2019 nCoV)    Coronavirus HKU1 NOT DETECTED NOT DETECTED Final   Coronavirus NL63 NOT DETECTED NOT DETECTED Final   Coronavirus OC43 NOT DETECTED NOT DETECTED Final   Metapneumovirus NOT DETECTED NOT DETECTED Final   Rhinovirus / Enterovirus NOT DETECTED NOT DETECTED Final   Influenza A NOT DETECTED NOT DETECTED Final   Influenza B NOT DETECTED NOT DETECTED Final   Parainfluenza Virus 1 NOT DETECTED NOT DETECTED Final   Parainfluenza Virus 2 NOT DETECTED NOT DETECTED Final   Parainfluenza Virus 3 NOT DETECTED NOT DETECTED Final   Parainfluenza Virus 4 NOT DETECTED NOT DETECTED Final   Respiratory Syncytial Virus NOT DETECTED NOT DETECTED Final   Bordetella pertussis NOT DETECTED NOT DETECTED Final   Bordetella Parapertussis NOT DETECTED NOT DETECTED Final   Chlamydophila pneumoniae NOT DETECTED NOT DETECTED Final   Mycoplasma pneumoniae NOT DETECTED NOT DETECTED Final    Comment: Performed at [facility], [address], [place], [place] [address]  Culture, blood (Routine X 2) w Reflex to ID Panel     Status: None (Preliminary result)   Collection Time:  06/29/22 12:10 PM   Specimen: BLOOD LEFT HAND  Result Value Ref Range Status   Specimen Description   Final    BLOOD LEFT HAND Performed at [facility], [address] [place], [place] [address]    Special Requests   Final    BOTTLES DRAWN AEROBIC AND ANAEROBIC Blood Culture results may not be optimal due to an excessive volume of blood received in culture bottles Performed at [facility], [address] [place], [place] [address]    Culture   Final    NO GROWTH < 24 HOURS Performed at [facility], [address], [place], [place] [address]    Report Status PENDING  Incomplete  Culture, blood (Routine X 2) w Reflex to ID Panel     Status: None (Preliminary result)   Collection Time: 06/29/22  1:15 PM   Specimen: BLOOD  Result Value Ref Range Status   Specimen Description   Final    BLOOD BLOOD RIGHT ARM Performed at [facility], [address] [place], [place] [address]  Special Requests   Final    BOTTLES DRAWN AEROBIC ONLY Blood Culture adequate volume Performed at [facility] [address] [place], [place] [address]    Culture   Final    NO GROWTH < 24 HOURS Performed at [facility] [address], [place], [place] [address]    Report Status PENDING  Incomplete         Radiology Studies: DG Chest Port 1 View  Result Date: 06/29/2022 CLINICAL DATA:  Fever with body aches EXAM: PORTABLE CHEST 1 VIEW COMPARISON:  03/10/2022 FINDINGS: Low volume chest with interstitial coarsening at the bases, similar to priors. Aeration is better than the most recent comparison. No edema, effusion, or pneumothorax. Normal heart size. IMPRESSION: Chronic interstitial opacity at the bases.  No focal pneumonia. Electronically Signed   By: Jorje Guild M.D.   On: 06/29/2022 07:42        Scheduled Meds:  amLODipine  10 mg Oral Daily   aspirin EC  81 mg Oral q AM   bisoprolol  5 mg Oral Daily   fluticasone  2  spray Each Nare Daily   guaiFENesin  600 mg Oral BID   insulin aspart  0-5 Units Subcutaneous QHS   insulin aspart  0-9 Units Subcutaneous TID WC   ipratropium-albuterol  3 mL Nebulization TID   loratadine  10 mg Oral Daily   pantoprazole  40 mg Oral Q0600   polyethylene glycol  17 g Oral Daily   senna-docusate  1 tablet Oral BID   Continuous Infusions:  sodium chloride 75 mL/hr at 06/30/22 6160   azithromycin 500 mg (06/30/22 1133)   cefTRIAXone (ROCEPHIN)  IV 2 g (06/30/22 0952)     LOS: 0 days    Time spent: 40 minutes    Irine Seal, MD [facility]   To contact the attending provider between 7A-7P or the covering provider during after hours 7P-7A, please log into the web site [email or web address] and access using universal [place] password for that web site. If you do not have the password, please call the hospital operator.  06/30/2022, 4:13 PM

## 2022-06-30 NOTE — Progress Notes (Signed)
Mobility Specialist - Progress Note   06/30/22 1147  Mobility  Activity Ambulated with assistance in hallway  Level of Assistance Standby assist, set-up cues, supervision of patient - no hands on  Assistive Device Four wheel walker  Distance Ambulated (ft) 250 ft  Activity Response Tolerated well  Mobility Referral Yes  $Mobility charge 1 Mobility   Pt received in recliner and agreeable to mobility. C/o feeling fatigued during session, but still eager to ambulate despite given option to take seated rest break. No other complaints during session. P to recliner after session with all needs met & daughter in room.   [facility]

## 2022-07-01 ENCOUNTER — Telehealth: Payer: Self-pay | Admitting: Internal Medicine

## 2022-07-01 DIAGNOSIS — J449 Chronic obstructive pulmonary disease, unspecified: Secondary | ICD-10-CM | POA: Diagnosis present

## 2022-07-01 DIAGNOSIS — K5900 Constipation, unspecified: Secondary | ICD-10-CM | POA: Diagnosis not present

## 2022-07-01 DIAGNOSIS — Z8249 Family history of ischemic heart disease and other diseases of the circulatory system: Secondary | ICD-10-CM | POA: Diagnosis not present

## 2022-07-01 DIAGNOSIS — G4730 Sleep apnea, unspecified: Secondary | ICD-10-CM | POA: Diagnosis present

## 2022-07-01 DIAGNOSIS — J111 Influenza due to unidentified influenza virus with other respiratory manifestations: Secondary | ICD-10-CM | POA: Diagnosis not present

## 2022-07-01 DIAGNOSIS — J189 Pneumonia, unspecified organism: Secondary | ICD-10-CM | POA: Diagnosis not present

## 2022-07-01 DIAGNOSIS — Z8616 Personal history of COVID-19: Secondary | ICD-10-CM | POA: Diagnosis not present

## 2022-07-01 DIAGNOSIS — K5904 Chronic idiopathic constipation: Secondary | ICD-10-CM | POA: Diagnosis not present

## 2022-07-01 DIAGNOSIS — Z8673 Personal history of transient ischemic attack (TIA), and cerebral infarction without residual deficits: Secondary | ICD-10-CM | POA: Diagnosis not present

## 2022-07-01 DIAGNOSIS — Z66 Do not resuscitate: Secondary | ICD-10-CM | POA: Diagnosis present

## 2022-07-01 DIAGNOSIS — Z86718 Personal history of other venous thrombosis and embolism: Secondary | ICD-10-CM | POA: Diagnosis not present

## 2022-07-01 DIAGNOSIS — N1832 Chronic kidney disease, stage 3b: Secondary | ICD-10-CM | POA: Diagnosis present

## 2022-07-01 DIAGNOSIS — Z880 Allergy status to penicillin: Secondary | ICD-10-CM | POA: Diagnosis not present

## 2022-07-01 DIAGNOSIS — N200 Calculus of kidney: Secondary | ICD-10-CM | POA: Diagnosis not present

## 2022-07-01 DIAGNOSIS — Z9102 Food additives allergy status: Secondary | ICD-10-CM | POA: Diagnosis not present

## 2022-07-01 DIAGNOSIS — Z91048 Other nonmedicinal substance allergy status: Secondary | ICD-10-CM | POA: Diagnosis not present

## 2022-07-01 DIAGNOSIS — I10 Essential (primary) hypertension: Secondary | ICD-10-CM | POA: Diagnosis not present

## 2022-07-01 DIAGNOSIS — K567 Ileus, unspecified: Secondary | ICD-10-CM | POA: Diagnosis not present

## 2022-07-01 DIAGNOSIS — E785 Hyperlipidemia, unspecified: Secondary | ICD-10-CM | POA: Diagnosis present

## 2022-07-01 DIAGNOSIS — E1122 Type 2 diabetes mellitus with diabetic chronic kidney disease: Secondary | ICD-10-CM | POA: Diagnosis present

## 2022-07-01 DIAGNOSIS — Z87891 Personal history of nicotine dependence: Secondary | ICD-10-CM | POA: Diagnosis not present

## 2022-07-01 DIAGNOSIS — D649 Anemia, unspecified: Secondary | ICD-10-CM | POA: Diagnosis present

## 2022-07-01 DIAGNOSIS — Z79899 Other long term (current) drug therapy: Secondary | ICD-10-CM | POA: Diagnosis not present

## 2022-07-01 DIAGNOSIS — K5641 Fecal impaction: Secondary | ICD-10-CM | POA: Diagnosis present

## 2022-07-01 DIAGNOSIS — Z882 Allergy status to sulfonamides status: Secondary | ICD-10-CM | POA: Diagnosis not present

## 2022-07-01 DIAGNOSIS — I130 Hypertensive heart and chronic kidney disease with heart failure and stage 1 through stage 4 chronic kidney disease, or unspecified chronic kidney disease: Secondary | ICD-10-CM | POA: Diagnosis present

## 2022-07-01 DIAGNOSIS — R509 Fever, unspecified: Secondary | ICD-10-CM | POA: Diagnosis present

## 2022-07-01 DIAGNOSIS — J208 Acute bronchitis due to other specified organisms: Secondary | ICD-10-CM | POA: Diagnosis present

## 2022-07-01 DIAGNOSIS — K219 Gastro-esophageal reflux disease without esophagitis: Secondary | ICD-10-CM | POA: Diagnosis present

## 2022-07-01 DIAGNOSIS — E1169 Type 2 diabetes mellitus with other specified complication: Secondary | ICD-10-CM | POA: Diagnosis present

## 2022-07-01 DIAGNOSIS — Z1152 Encounter for screening for COVID-19: Secondary | ICD-10-CM | POA: Diagnosis not present

## 2022-07-01 DIAGNOSIS — K6389 Other specified diseases of intestine: Secondary | ICD-10-CM | POA: Diagnosis not present

## 2022-07-01 DIAGNOSIS — I5032 Chronic diastolic (congestive) heart failure: Secondary | ICD-10-CM | POA: Diagnosis present

## 2022-07-01 LAB — CBC WITH DIFFERENTIAL/PLATELET
Abs Immature Granulocytes: 0.04 10*3/uL (ref 0.00–0.07)
Basophils Absolute: 0 10*3/uL (ref 0.0–0.1)
Basophils Relative: 0 %
Eosinophils Absolute: 0.3 10*3/uL (ref 0.0–0.5)
Eosinophils Relative: 3 %
HCT: 33.9 % — ABNORMAL LOW (ref 36.0–46.0)
Hemoglobin: 10.4 g/dL — ABNORMAL LOW (ref 12.0–15.0)
Immature Granulocytes: 1 %
Lymphocytes Relative: 15 %
Lymphs Abs: 1.3 10*3/uL (ref 0.7–4.0)
MCH: 27 pg (ref 26.0–34.0)
MCHC: 30.7 g/dL (ref 30.0–36.0)
MCV: 88.1 fL (ref 80.0–100.0)
Monocytes Absolute: 0.8 10*3/uL (ref 0.1–1.0)
Monocytes Relative: 10 %
Neutro Abs: 6.2 10*3/uL (ref 1.7–7.7)
Neutrophils Relative %: 71 %
Platelets: 216 10*3/uL (ref 150–400)
RBC: 3.85 MIL/uL — ABNORMAL LOW (ref 3.87–5.11)
RDW: 15.2 % (ref 11.5–15.5)
WBC: 8.6 10*3/uL (ref 4.0–10.5)
nRBC: 0 % (ref 0.0–0.2)

## 2022-07-01 LAB — BASIC METABOLIC PANEL
Anion gap: 8 (ref 5–15)
BUN: 19 mg/dL (ref 8–23)
CO2: 24 mmol/L (ref 22–32)
Calcium: 8.6 mg/dL — ABNORMAL LOW (ref 8.9–10.3)
Chloride: 106 mmol/L (ref 98–111)
Creatinine, Ser: 1.11 mg/dL — ABNORMAL HIGH (ref 0.44–1.00)
GFR, Estimated: 47 mL/min — ABNORMAL LOW (ref >60)
Glucose, Bld: 122 mg/dL — ABNORMAL HIGH (ref 70–99)
Potassium: 4 mmol/L (ref 3.5–5.1)
Sodium: 138 mmol/L (ref 135–145)

## 2022-07-01 LAB — MAGNESIUM: Magnesium: 2.1 mg/dL (ref 1.7–2.4)

## 2022-07-01 LAB — LEGIONELLA PNEUMOPHILA SEROGP 1 UR AG: L. pneumophila Serogp 1 Ur Ag: NEGATIVE

## 2022-07-01 LAB — GLUCOSE, CAPILLARY
Glucose-Capillary: 115 mg/dL — ABNORMAL HIGH (ref 70–99)
Glucose-Capillary: 109 mg/dL — ABNORMAL HIGH (ref 70–99)
Glucose-Capillary: 107 mg/dL — ABNORMAL HIGH (ref 70–99)
Glucose-Capillary: 158 mg/dL — ABNORMAL HIGH (ref 70–99)

## 2022-07-01 LAB — URINE CULTURE: Culture: NO GROWTH

## 2022-07-01 MED ORDER — AZITHROMYCIN 250 MG PO TABS
500.0000 mg | ORAL_TABLET | Freq: Every day | ORAL | Status: AC
  Administered 2022-07-02 – 2022-07-03 (×2): 500 mg via ORAL
  Filled 2022-07-01 (×2): qty 2

## 2022-07-01 MED ORDER — FUROSEMIDE 10 MG/ML IJ SOLN: 20.0000 mg | Freq: Once | INTRAMUSCULAR | Status: DC

## 2022-07-01 MED ORDER — FUROSEMIDE 10 MG/ML IJ SOLN
20.0000 mg | Freq: Two times a day (BID) | INTRAMUSCULAR | Status: AC
  Administered 2022-07-01 (×2): 20 mg via INTRAVENOUS
  Filled 2022-07-01 (×2): qty 2

## 2022-07-01 MED ORDER — FUROSEMIDE 20 MG PO TABS
20.0000 mg | ORAL_TABLET | Freq: Every day | ORAL | Status: DC
  Administered 2022-07-02 – 2022-07-03 (×2): 20 mg via ORAL
  Filled 2022-07-01 (×2): qty 1

## 2022-07-01 MED ORDER — POLYETHYLENE GLYCOL 3350 17 G PO PACK
17.0000 g | PACK | Freq: Two times a day (BID) | ORAL | Status: DC
  Administered 2022-07-01 – 2022-07-03 (×4): 17 g via ORAL
  Filled 2022-07-01 (×4): qty 1

## 2022-07-01 MED ORDER — SORBITOL 70 % SOLN
30.0000 mL | Status: AC
  Administered 2022-07-01 (×2): 30 mL via ORAL
  Filled 2022-07-01 (×2): qty 30

## 2022-07-01 NOTE — Evaluation (Signed)
Physical Therapy Evaluation Patient Details Name: Llana Deshazo MRN: [id number] DOB: 1927-07-07 Today's Date: 07/01/2022  History of Present Illness  Ms. Marshman is a 89 yr old female admitted 06/29/22 to the hospital with fever, chills, and body aches. PMH: CKD 3, COPD, CVA, DM II, HTN, HLD, arthritis, DVT, TIA  Clinical Impression  Pt admitted with above diagnosis. At baseline, pt from ILF and ambulatory in community with rollator.  Today, pt transferring at supervision level for lines and ambulated 250' in hallway with rollator safely.  She tolerated well and is near baseline.  No further PT needs.      Recommendations for follow up therapy are one component of a multi-disciplinary discharge planning process, led by the attending physician.  Recommendations may be updated based on patient status, additional functional criteria and insurance authorization.  Follow Up Recommendations No PT follow up      Assistance Recommended at Discharge PRN  Patient can return home with the following  Assist for transportation    Equipment Recommendations None recommended by PT  Recommendations for Other Services       Functional Status Assessment Patient has not had a recent decline in their functional status     Precautions / Restrictions Precautions Precautions: None      Mobility  Bed Mobility               General bed mobility comments: Pt was received seated in the bedside chair    Transfers Overall transfer level: Needs assistance Equipment used: Rollator (4 wheels) Transfers: Sit to/from Stand Sit to Stand: Supervision                Ambulation/Gait Ambulation/Gait assistance: Supervision Gait Distance (Feet): 200 Feet Assistive device: Rollator (4 wheels) Gait Pattern/deviations: WFL(Within Functional Limits) Gait velocity: fair speed        Stairs            Wheelchair Mobility    Modified Rankin (Stroke Patients Only)       Balance Overall  balance assessment: Independent   Sitting balance-Leahy Scale: Normal     Standing balance support: Bilateral upper extremity supported, No upper extremity supported Standing balance-Leahy Scale: Good Standing balance comment: could stand and transfer without AD; rollator to walk in hall                             Pertinent Vitals/Pain Pain Assessment Pain Assessment: No/denies pain    Home Living Family/patient expects to be discharged to:: Other (Comment) (ILF) Living Arrangements: Alone   Type of Home: Independent living facility Home Access: Level entry       Home Layout: One level Home Equipment: Rollator (4 wheels);BSC/3in1      Prior Function Prior Level of Function : Independent/Modified Independent             Mobility Comments: She did not use an assistive device inside, however used a rollator outside of her apartment. ADLs Comments: She was modified independent to independent with ADLs. Her daughter assisted with transportation.     Hand Dominance   Dominant Hand: Right    Extremity/Trunk Assessment   Upper Extremity Assessment Upper Extremity Assessment: Overall WFL for tasks assessed    Lower Extremity Assessment Lower Extremity Assessment: Overall WFL for tasks assessed    Cervical / Trunk Assessment Cervical / Trunk Assessment: Kyphotic  Communication   Communication: HOH  Cognition Arousal/Alertness: Awake/alert Behavior During Therapy: WFL for tasks  assessed/performed Overall Cognitive Status: Within Functional Limits for tasks assessed                                 General Comments: Oriented x4, able to follow commands        General Comments General comments (skin integrity, edema, etc.): vss    Exercises     Assessment/Plan    PT Assessment Patient does not need any further PT services  PT Problem List         PT Treatment Interventions      PT Goals (Current goals can be found in the  Care Plan section)  Acute Rehab PT Goals Patient Stated Goal: return home PT Goal Formulation: All assessment and education complete, DC therapy    Frequency       Co-evaluation               AM-PAC PT "6 Clicks" Mobility  Outcome Measure Help needed turning from your back to your side while in a flat bed without using bedrails?: None Help needed moving from lying on your back to sitting on the side of a flat bed without using bedrails?: None Help needed moving to and from a bed to a chair (including a wheelchair)?: None Help needed standing up from a chair using your arms (e.g., wheelchair or bedside chair)?: None Help needed to walk in hospital room?: A Little Help needed climbing 3-5 steps with a railing? : A Little 6 Click Score: 22    End of Session Equipment Utilized During Treatment: Gait belt Activity Tolerance: Patient tolerated treatment well Patient left: with call bell/phone within reach;in chair;with chair alarm set Nurse Communication: Mobility status PT Visit Diagnosis: Other abnormalities of gait and mobility (R26.89)    Time: 7939-0300 PT Time Calculation (min) (ACUTE ONLY): 12 min   Charges:   PT Evaluation $PT Eval Low Complexity: 1 Low          Tauna Macfarlane, PT Acute Rehab [facility] [phone]   Karlton Lemon 07/01/2022, 2:37 PM

## 2022-07-01 NOTE — Evaluation (Signed)
Occupational Therapy Evaluation Patient Details Name: Jaelah Hauth MRN: [id number] DOB: 1927/12/10 Today's Date: 07/01/2022   History of Present Illness Ms. Prentiss is a 89 yr old female admitted to the hospital with fever, chills, and body aches. PMH: CKD 3, COPD, CVA, DM II, HTN, HLD, arthritis, DVT, TIA   Clinical Impression   The patient performed all assessed tasks with distant supervision or better, including lower body dressing, sit to stand, a toilet transfer at bathroom level, and ambulating using a rollator. She appears to be at or very near to her baseline level of functioning for self-care management & she is not presenting with acute functional deficits that warrant the need for further OT services; she is in agreement with this. As such, OT will sign off.       Recommendations for follow up therapy are one component of a multi-disciplinary discharge planning process, led by the attending physician.  Recommendations may be updated based on patient status, additional functional criteria and insurance authorization.   Follow Up Recommendations  No OT follow up; Return to [place] Recommended at Discharge PRN  Patient can return home with the following Assist for transportation;Assistance with cooking/housework    Functional Status Assessment  Patient has not had a recent decline in their functional status  Equipment Recommendations  None recommended by OT       Precautions / Restrictions Precautions Precautions: None Restrictions Weight Bearing Restrictions: No      Mobility Bed Mobility      General bed mobility comments: Pt was received seated in the bedside chair    Transfers Overall transfer level: Needs assistance Equipment used: Rollator (4 wheels) Transfers: Sit to/from Stand          Balance     Sitting balance-Leahy Scale: Good         Standing balance comment: Supervision with rollator           ADL  either performed or assessed with clinical judgement   ADL Overall ADL's : At baseline           Vision   Additional Comments: She correctly read the time depicted on the wall clock            Pertinent Vitals/Pain Pain Assessment Pain Assessment: No/denies pain     Hand Dominance Right   Extremity/Trunk Assessment Upper Extremity Assessment Upper Extremity Assessment: Overall WFL for tasks assessed   Lower Extremity Assessment Lower Extremity Assessment: Overall WFL for tasks assessed       Communication Communication Communication: HOH   Cognition Arousal/Alertness: Awake/alert Behavior During Therapy: WFL for tasks assessed/performed Overall Cognitive Status: Within Functional Limits for tasks assessed            General Comments: Oriented x4, able to follow commands            Home Living Family/patient expects to be discharged to:: Other (Comment) ([place]) Living Arrangements: Alone   Type of Home: Independent living facility Home Access: Level entry     Home Layout: One level     Bathroom Shower/Tub: Walk-in shower         Home Equipment: Rollator (4 wheels);BSC/3in1          Prior Functioning/Environment Prior Level of Function : Independent/Modified Independent             Mobility Comments: She did not use an assistive device inside, however used a rollator outside of her apartment. ADLs Comments:  She was modified independent to independent with ADLs. Her daughter assisted with transportation.              OT Treatment/Interventions:   No further treatment needs identified       OT Frequency:  N/A       AM-PAC OT "6 Clicks" Daily Activity     Outcome Measure Help from another person eating meals?: None Help from another person taking care of personal grooming?: None Help from another person toileting, which includes using toliet, bedpan, or urinal?: None Help from another person bathing  (including washing, rinsing, drying)?: None Help from another person to put on and taking off regular upper body clothing?: None Help from another person to put on and taking off regular lower body clothing?: None 6 Click Score: 24   End of Session Equipment Utilized During Treatment: Rollator (4 wheels) Nurse Communication: Mobility status  Activity Tolerance: Patient tolerated treatment well Patient left: in chair;with call bell/phone within reach;with chair alarm set  OT Visit Diagnosis: Muscle weakness (generalized) (M62.81)                Time: 0370-4888 OT Time Calculation (min): 15 min Charges:  OT General Charges $OT Visit: 1 Visit OT Evaluation $OT Eval Low Complexity: 1 Low    Yocelin Vanlue L Dondi Aime, OTR/L 07/01/2022, 12:37 PM

## 2022-07-01 NOTE — Progress Notes (Signed)
PROGRESS NOTE    Stacey Armstrong  VIF:[id number] DOB: 01-16-1928 DOA: 06/29/2022 PCP: Philip Aspen, Limmie Patricia, MD    Chief Complaint  Patient presents with   Nasal Congestion   Fever   Generalized Body Aches    Brief Narrative:  Patient 89 year old female history of CKD stage IIIb, COPD, CVA, diabetes, hypertension, hyperlipidemia presented with fever chills body aches suddenly the night prior to admission with some associated sneezing, sinus congestion but no specific cough.  Patient noted to have a fever on presentation.  COVID-19 PCR negative, influenza A and B PCR negative, respiratory viral PCR negative, respiratory viral panel negative.  Patient placed on empiric IV antibiotics of Rocephin and azithromycin.  Patient pancultured.   Assessment & Plan:   Principal Problem:   Febrile illness, acute Active Problems:   Type 2 diabetes mellitus with hyperlipidemia (HCC)   Essential hypertension   Hyperlipidemia   History of CVA (cerebrovascular accident)   SIRS (systemic inflammatory response syndrome) (HCC)   Stage 3b chronic kidney disease (CKD) (HCC)   COPD (chronic obstructive pulmonary disease) (HCC)   Constipation   #1 SIRS/febrile illness -Patient presenting with febrile like illness with fever with a temp of 101.1, upper respiratory symptoms, chills, body aches with sudden onset. -COVID-19 PCR negative, influenza a and B PCR negative, respiratory viral PCR negative, respiratory viral panel negative, chest x-ray with no acute abnormalities noted. -Pancultured. -Urine culture with no growth to date.   -Blood cultures with no growth x 2 days.   -Urine strep pneumococcus antigen negative. -Continue IV Rocephin.  -Change IV azithromycin to oral azithromycin.   -Continue Mucinex, Claritin, Flonase, PPI, scheduled DuoNebs.   -Blood cultures remain negative after 3 days will transition from IV Rocephin to oral Omnicef to complete a 5 to 7-day course of empiric antibiotic  treatment.  -Supportive care.  2.  Hypertension -Continue home regimen Norvasc, Zebeta. -Will give a dose of IV Lasix x 2 doses and resume home regimen oral Lasix tomorrow.  3.  History of CVA -Aspirin for secondary stroke prophylaxis.   -Hold statin due to presentation with diffuse body aches and may resume on discharge.  4.  Hyperlipidemia -Statin.  5.  CKD stage IIIb -Stable.  6.  Diabetes mellitus type 2 -Hemoglobin A1c 6.4 (06/29/2022) -CBG 115 this morning.  -SSI.  7.  COPD -Continue Dulera, Claritin, PPI, scheduled DuoNebs.  8.  Constipation -Patient stated has not had a bowel movement in almost a week. -Increase MiraLAX to twice daily, continue Senokot-S twice daily. -Sorbitol 30 cc every 3 hours x 2 doses.   DVT prophylaxis: SCDs Code Status: Full(discussed and verified with patient and daughter at bedside.) Family Communication: Updated patient, daughter at bedside. Disposition: TBD.  Status is: Inpatient.    Consultants:  None  Procedures:  Chest x-ray 06/29/2022  Antimicrobials: IV azithromycin 06/29/2022.>>> IV Rocephin 06/29/2022>>>>   Subjective: Sitting up in chair.  Feels some improvement with shortness of breath and congestion since admission.  Denies any chest pain.  Diffuse aches improved.  Complain of constipation.  Per patient no bowel movement in over a week.  Daughter at bedside.    Objective: Vitals:   07/01/22 0335 07/01/22 0851 07/01/22 1221 07/01/22 1350  BP: (!) 170/66  (!) 161/57   Pulse: 66  (!) 57   Resp: 20  16   Temp: 98.3 F (36.8 C)  98.2 F (36.8 C)   TempSrc:   Oral   SpO2: 94% 95% 100% 96%  Intake/Output Summary (Last 24 hours) at 07/01/2022 1522 Last data filed at 07/01/2022 1339 Gross per 24 hour  Intake 2813.26 ml  Output 1725 ml  Net 1088.26 ml   There were no vitals filed for this visit.  Examination:  General exam: NAD.  Congested.  Respiratory system: Bibasilar carackles, no wheezing.  Fair air movement.   Speaking in full sentences.  Cardiovascular system: Regular rate rhythm no murmurs rubs or gallops.  No JVD.  Trace to 1+ bilateral lower extremity edema. Gastrointestinal system: Abdomen is soft, nontender, nondistended, positive bowel sounds.  No rebound.  No guarding.  Central nervous system: Alert and oriented. No focal neurological deficits. Extremities: Symmetric 5 x 5 power. Skin: No rashes, lesions or ulcers Psychiatry: Judgement and insight appear normal. Mood & affect appropriate.     Data Reviewed: I have personally reviewed following labs and imaging studies  CBC: Recent Labs  Lab 06/29/22 0641 06/30/22 0453 07/01/22 0456  WBC 17.5* 13.5* 8.6  NEUTROABS 15.0*  --  6.2  HGB 11.4* 10.3* 10.4*  HCT 36.3 31.8* 33.9*  MCV 86.6 87.8 88.1  PLT 236 206 675    Basic Metabolic Panel: Recent Labs  Lab 06/29/22 0641 06/30/22 0453 07/01/22 0456  NA 135 135 138  K 3.9 4.0 4.0  CL 101 101 106  CO2 $Re'22 24 24  'QKr$ GLUCOSE 180* 134* 122*  BUN $Re'22 18 19  'onK$ CREATININE 1.27* 1.09* 1.11*  CALCIUM 8.9 8.5* 8.6*  MG  --   --  2.1    GFR: CrCl cannot be calculated (Unknown ideal weight.).  Liver Function Tests: Recent Labs  Lab 06/30/22 0453  AST 19  ALT 17  ALKPHOS 72  BILITOT 1.0  PROT 5.5*  ALBUMIN 2.7*    CBG: Recent Labs  Lab 06/30/22 1157 06/30/22 1632 06/30/22 1941 07/01/22 0735 07/01/22 1148  GLUCAP 148* 89 146* 115* 109*     Recent Results (from the past 240 hour(s))  Resp panel by RT-PCR (RSV, Flu A&B, Covid) Anterior Nasal Swab     Status: None   Collection Time: 06/29/22  5:51 AM   Specimen: Anterior Nasal Swab  Result Value Ref Range Status   SARS Coronavirus 2 by RT PCR NEGATIVE NEGATIVE Final    Comment: (NOTE) SARS-CoV-2 target nucleic acids are NOT DETECTED.  The SARS-CoV-2 RNA is generally detectable in upper respiratory specimens during the acute phase of infection. The lowest concentration of SARS-CoV-2 viral copies this assay can  detect is 138 copies/mL. A negative result does not preclude SARS-Cov-2 infection and should not be used as the sole basis for treatment or other patient management decisions. A negative result may occur with  improper specimen collection/handling, submission of specimen other than nasopharyngeal swab, presence of viral mutation(s) within the areas targeted by this assay, and inadequate number of viral copies(<138 copies/mL). A negative result must be combined with clinical observations, patient history, and epidemiological information. The expected result is Negative.  Fact Sheet for Patients:  EntrepreneurPulse.com.au  Fact Sheet for Healthcare Providers:  IncredibleEmployment.be  This test is no t yet approved or cleared by the [place] FDA and  has been authorized for detection and/or diagnosis of SARS-CoV-2 by FDA under an Emergency Use Authorization (EUA). This EUA will remain  in effect (meaning this test can be used) for the duration of the COVID-19 declaration under Section 564(b)(1) of the Act, 21 U.S.C.section 360bbb-3(b)(1), unless the authorization is terminated  or revoked sooner.       Influenza  A by PCR NEGATIVE NEGATIVE Final   Influenza B by PCR NEGATIVE NEGATIVE Final    Comment: (NOTE) The Xpert Xpress SARS-CoV-2/FLU/RSV plus assay is intended as an aid in the diagnosis of influenza from Nasopharyngeal swab specimens and should not be used as a sole basis for treatment. Nasal washings and aspirates are unacceptable for Xpert Xpress SARS-CoV-2/FLU/RSV testing.  Fact Sheet for Patients: EntrepreneurPulse.com.au  Fact Sheet for Healthcare Providers: IncredibleEmployment.be  This test is not yet approved or cleared by the [place] FDA and has been authorized for detection and/or diagnosis of SARS-CoV-2 by FDA under an Emergency Use Authorization (EUA). This EUA will remain in  effect (meaning this test can be used) for the duration of the COVID-19 declaration under Section 564(b)(1) of the Act, 21 U.S.C. section 360bbb-3(b)(1), unless the authorization is terminated or revoked.     Resp Syncytial Virus by PCR NEGATIVE NEGATIVE Final    Comment: (NOTE) Fact Sheet for Patients: EntrepreneurPulse.com.au  Fact Sheet for Healthcare Providers: IncredibleEmployment.be  This test is not yet approved or cleared by the [place] FDA and has been authorized for detection and/or diagnosis of SARS-CoV-2 by FDA under an Emergency Use Authorization (EUA). This EUA will remain in effect (meaning this test can be used) for the duration of the COVID-19 declaration under Section 564(b)(1) of the Act, 21 U.S.C. section 360bbb-3(b)(1), unless the authorization is terminated or revoked.  Performed at [facility], [place] [address] [place], [place] [address]   Respiratory (~20 pathogens) panel by PCR     Status: None   Collection Time: 06/29/22  6:41 AM   Specimen: Nasopharyngeal Swab; Respiratory  Result Value Ref Range Status   Adenovirus NOT DETECTED NOT DETECTED Final   Coronavirus 229E NOT DETECTED NOT DETECTED Final    Comment: (NOTE) The Coronavirus on the Respiratory Panel, DOES NOT test for the novel  Coronavirus (2019 nCoV)    Coronavirus HKU1 NOT DETECTED NOT DETECTED Final   Coronavirus NL63 NOT DETECTED NOT DETECTED Final   Coronavirus OC43 NOT DETECTED NOT DETECTED Final   Metapneumovirus NOT DETECTED NOT DETECTED Final   Rhinovirus / Enterovirus NOT DETECTED NOT DETECTED Final   Influenza A NOT DETECTED NOT DETECTED Final   Influenza B NOT DETECTED NOT DETECTED Final   Parainfluenza Virus 1 NOT DETECTED NOT DETECTED Final   Parainfluenza Virus 2 NOT DETECTED NOT DETECTED Final   Parainfluenza Virus 3 NOT DETECTED NOT DETECTED Final   Parainfluenza Virus 4 NOT DETECTED NOT DETECTED Final    Respiratory Syncytial Virus NOT DETECTED NOT DETECTED Final   Bordetella pertussis NOT DETECTED NOT DETECTED Final   Bordetella Parapertussis NOT DETECTED NOT DETECTED Final   Chlamydophila pneumoniae NOT DETECTED NOT DETECTED Final   Mycoplasma pneumoniae NOT DETECTED NOT DETECTED Final    Comment: Performed at [facility], [place]. [address] [place], [place] [address]  Urine Culture (for pregnant, neutropenic or urologic patients or patients with an indwelling urinary catheter)     Status: None   Collection Time: 06/29/22  9:09 AM   Specimen: Urine, Catheterized  Result Value Ref Range Status   Specimen Description   Final    URINE, CATHETERIZED Performed at [place] [address], [place], [place] [address]    Special Requests   Final    NONE Performed at [facility], [place] [address] [place], [place] [address]    Culture   Final    NO GROWTH Performed at  [facility], [place] [address], [place], [place] [address]    Report Status 07/01/2022 FINAL  Final  Culture, blood (Routine X 2) w Reflex to ID Panel     Status: None (Preliminary result)   Collection Time: 06/29/22 12:10 PM   Specimen: BLOOD LEFT HAND  Result Value Ref Range Status   Specimen Description   Final    BLOOD LEFT HAND Performed at [facility], [place] [address] [place], [place] [address]    Special Requests   Final    BOTTLES DRAWN AEROBIC AND ANAEROBIC Blood Culture results may not be optimal due to an excessive volume of blood received in culture bottles Performed at [place] [address], [place], [place] [address]    Culture   Final    NO GROWTH 2 DAYS Performed at [place] [address] [place], [place] [address]    Report Status PENDING  Incomplete  Culture, blood (Routine X 2) w Reflex to ID Panel     Status: None (Preliminary result)   Collection Time: 06/29/22  1:15 PM   Specimen: BLOOD  Result Value  Ref Range Status   Specimen Description   Final    BLOOD BLOOD RIGHT ARM Performed at [facility], [place] [address] [place], [place] [address]    Special Requests   Final    BOTTLES DRAWN AEROBIC ONLY Blood Culture adequate volume Performed at [place] [address], [place], [place] [address]    Culture   Final    NO GROWTH 2 DAYS Performed at [place] [address] [place], [place] [address]    Report Status PENDING  Incomplete         Radiology Studies: No results found.      Scheduled Meds:  amLODipine  10 mg Oral Daily   aspirin EC  81 mg Oral q AM   [START ON 07/02/2022] azithromycin  500 mg Oral Daily   bisoprolol  5 mg Oral Daily   fluticasone  2 spray Each Nare Daily   furosemide  20 mg Intravenous Q12H   guaiFENesin  600 mg Oral BID   insulin aspart  0-5 Units Subcutaneous QHS   insulin aspart  0-9 Units Subcutaneous TID WC   ipratropium-albuterol  3 mL Nebulization TID   loratadine  10 mg Oral Daily   mometasone-formoterol  2 puff Inhalation BID   pantoprazole  40 mg Oral Q0600   polyethylene glycol  17 g Oral Daily   senna-docusate  1 tablet Oral BID   sorbitol  30 mL Oral Q3H   Continuous Infusions:  cefTRIAXone (ROCEPHIN)  IV 2 g (07/01/22 0936)     LOS: 0 days    Time spent: 35 minutes    Irine Seal, MD [facility]   To contact the attending provider between 7A-7P or the covering provider during after hours 7P-7A, please log into the web site [email or web address] and access using universal [place] password for that web site. If you do not have the password, please call the hospital operator.  07/01/2022, 3:22 PM

## 2022-07-01 NOTE — Telephone Encounter (Signed)
FYI paula pt daughter is calling to let md know her mother is in hospital at [facility] and hopefully will be there another day. Pt daughter did not give me the dx

## 2022-07-02 ENCOUNTER — Inpatient Hospital Stay (HOSPITAL_COMMUNITY): Payer: Medicare HMO

## 2022-07-02 DIAGNOSIS — K5904 Chronic idiopathic constipation: Secondary | ICD-10-CM

## 2022-07-02 DIAGNOSIS — J111 Influenza due to unidentified influenza virus with other respiratory manifestations: Secondary | ICD-10-CM | POA: Diagnosis not present

## 2022-07-02 DIAGNOSIS — R509 Fever, unspecified: Secondary | ICD-10-CM | POA: Diagnosis not present

## 2022-07-02 DIAGNOSIS — J189 Pneumonia, unspecified organism: Secondary | ICD-10-CM | POA: Diagnosis not present

## 2022-07-02 DIAGNOSIS — D649 Anemia, unspecified: Secondary | ICD-10-CM

## 2022-07-02 LAB — CBC WITH DIFFERENTIAL/PLATELET
Abs Immature Granulocytes: 0.04 10*3/uL (ref 0.00–0.07)
Basophils Absolute: 0 10*3/uL (ref 0.0–0.1)
Basophils Relative: 1 %
Eosinophils Absolute: 0.3 10*3/uL (ref 0.0–0.5)
Eosinophils Relative: 4 %
HCT: 34.4 % — ABNORMAL LOW (ref 36.0–46.0)
Hemoglobin: 10.7 g/dL — ABNORMAL LOW (ref 12.0–15.0)
Immature Granulocytes: 1 %
Lymphocytes Relative: 13 %
Lymphs Abs: 1.1 10*3/uL (ref 0.7–4.0)
MCH: 27.3 pg (ref 26.0–34.0)
MCHC: 31.1 g/dL (ref 30.0–36.0)
MCV: 87.8 fL (ref 80.0–100.0)
Monocytes Absolute: 0.9 10*3/uL (ref 0.1–1.0)
Monocytes Relative: 11 %
Neutro Abs: 5.6 10*3/uL (ref 1.7–7.7)
Neutrophils Relative %: 70 %
Platelets: 242 10*3/uL (ref 150–400)
RBC: 3.92 MIL/uL (ref 3.87–5.11)
RDW: 15 % (ref 11.5–15.5)
WBC: 8 10*3/uL (ref 4.0–10.5)
nRBC: 0 % (ref 0.0–0.2)

## 2022-07-02 LAB — BASIC METABOLIC PANEL
Anion gap: 10 (ref 5–15)
BUN: 18 mg/dL (ref 8–23)
CO2: 25 mmol/L (ref 22–32)
Calcium: 8.8 mg/dL — ABNORMAL LOW (ref 8.9–10.3)
Chloride: 101 mmol/L (ref 98–111)
Creatinine, Ser: 1.13 mg/dL — ABNORMAL HIGH (ref 0.44–1.00)
GFR, Estimated: 46 mL/min — ABNORMAL LOW (ref >60)
Glucose, Bld: 119 mg/dL — ABNORMAL HIGH (ref 70–99)
Potassium: 3.6 mmol/L (ref 3.5–5.1)
Sodium: 136 mmol/L (ref 135–145)

## 2022-07-02 LAB — GLUCOSE, CAPILLARY
Glucose-Capillary: 122 mg/dL — ABNORMAL HIGH (ref 70–99)
Glucose-Capillary: 94 mg/dL (ref 70–99)
Glucose-Capillary: 96 mg/dL (ref 70–99)
Glucose-Capillary: 160 mg/dL — ABNORMAL HIGH (ref 70–99)

## 2022-07-02 LAB — TSH: TSH: 1.557 u[IU]/mL (ref 0.350–4.500)

## 2022-07-02 LAB — T4, FREE: Free T4: 1.11 ng/dL (ref 0.61–1.12)

## 2022-07-02 LAB — BRAIN NATRIURETIC PEPTIDE: B Natriuretic Peptide: 335.1 pg/mL — ABNORMAL HIGH (ref 0.0–100.0)

## 2022-07-02 MED ORDER — IPRATROPIUM-ALBUTEROL 0.5-2.5 (3) MG/3ML IN SOLN
3.0000 mL | Freq: Two times a day (BID) | RESPIRATORY_TRACT | Status: DC
  Administered 2022-07-02: 3 mL via RESPIRATORY_TRACT
  Filled 2022-07-02 (×2): qty 3

## 2022-07-02 MED ORDER — LINACLOTIDE 145 MCG PO CAPS
290.0000 ug | ORAL_CAPSULE | Freq: Every day | ORAL | Status: DC
  Administered 2022-07-02 – 2022-07-03 (×2): 290 ug via ORAL
  Filled 2022-07-02 (×2): qty 2

## 2022-07-02 MED ORDER — PSYLLIUM 95 % PO PACK
1.0000 | PACK | Freq: Every day | ORAL | Status: DC
  Administered 2022-07-02 – 2022-07-03 (×2): 1 via ORAL
  Filled 2022-07-02 (×2): qty 1

## 2022-07-02 MED ORDER — BISACODYL 5 MG PO TBEC
10.0000 mg | DELAYED_RELEASE_TABLET | Freq: Once | ORAL | Status: AC
  Administered 2022-07-02: 10 mg via ORAL
  Filled 2022-07-02: qty 2

## 2022-07-02 MED ORDER — IOHEXOL 9 MG/ML PO SOLN
500.0000 mL | ORAL | Status: AC
  Administered 2022-07-02: 500 mL via ORAL

## 2022-07-02 MED ORDER — SORBITOL 70 % SOLN
960.0000 mL | TOPICAL_OIL | Freq: Once | ORAL | Status: AC
  Administered 2022-07-02: 960 mL via RECTAL
  Filled 2022-07-02 (×2): qty 240

## 2022-07-02 MED ORDER — IOHEXOL 9 MG/ML PO SOLN
ORAL | Status: AC
  Administered 2022-07-02: 500 mL via ORAL
  Filled 2022-07-02: qty 1000

## 2022-07-02 NOTE — Progress Notes (Signed)
Mobility Specialist - Progress Note   07/02/22 0943  Mobility  Activity Ambulated with assistance in hallway  Level of Assistance Modified independent, requires aide device or extra time  Assistive Device Other (Comment) (rollator)  Distance Ambulated (ft) 200 ft  Range of Motion/Exercises Active  Activity Response Tolerated well  Mobility Referral Yes  $Mobility charge 1 Mobility   Pt was found on recliner chair and agreeable to ambulate. Had no complaints and became fatigued by EOS. Returned to [employer] chair with necessities in reach and chair alarm on.  Ferd Hibbs Mobility Specialist

## 2022-07-02 NOTE — Progress Notes (Addendum)
[facility]                                                                              Stacey Armstrong, is a 89 y.o. female, DOB - 1928-03-03, KDX:[id number] Admit date - 06/29/2022    Outpatient Primary MD for the patient is [name], Rayford Halsted, MD  LOS - 1  days  Chief Complaint  Patient presents with   Nasal Congestion   Fever   Generalized Body Aches       Brief summary   Patient 89 year old female history of CKD stage IIIb, COPD, CVA, diabetes, hypertension, hyperlipidemia presented with fever chills body aches suddenly the night prior to admission with some associated sneezing, sinus congestion but no specific cough. Patient noted to have a fever on presentation. COVID-19 PCR negative, influenza A and B PCR negative, respiratory viral PCR negative, respiratory viral panel negative.  Patient placed on empiric IV antibiotics of Rocephin and azithromycin, admitted for further workup..    Assessment & Plan    Principal Problem:   SIRS, Febrile illness, acute -Presented with febrile illness, fever 101.1 F, URI, body aches, chills with sudden onset.  COVID, flu, RSV negative, RVP negative -Chest x-ray showed no acute abnormalities -Blood cultures negative so far.  Urine culture NTD -Urine strep antigen, urine Legionella antigen negative -Patient was placed on IV Rocephin, Zithromax, continue -Supportive care   Active Problems: Essential hypertension -Continue bisoprolol, Norvasc -Received Lasix IV x 2 doses and home regimen 20 mg daily resumed -Follow be met  Constipation, longstanding history -Has chronic constipation, fecal impaction follows GI, Dr. Havery Moros -Patient has been taking MiraLAX and Senokot as at home with no significant improvement.  Was seen on 05/16/2022 by GI outpatient was recommended to increase MiraLAX twice daily, Elavil was discontinued. -Per patient, no BM for last 1 week, obtain abdominal x-ray.  May need CT abdomen,  pending x-ray results -Increase MiraLAX to twice daily, smog enema today - started on Linzess 290 mcg daily, psyllium daily -Encouraged p.o. hydration -Obtain TSH, free T4 Addendum: 2:22pm Abdominal x-ray shows diffuse ileus, will obtain CT abdomen and pelvis.  Findings discussed with patient's daughter on the phone.  History of CVA -Continue aspirin, hold statin due to presentation with diffuse bodyaches -Will resume statin on discharge.  Hyperlipidemia  -Will resume statin on discharge.   CKD stage IIIb -Creatinine 1.1, at baseline   Diabetes mellitus type 2, with complications CKD stage IIIb -Hemoglobin A1c 6.4 on 2//24 -Continue sliding scale insulin  COPD -Currently stable, continue Dulera, Claritin, scheduled DuoNebs  GERD -Continue PPI    Code Status: Full CODE STATUS DVT Prophylaxis:  SCDs Start: 06/29/22 1232   Level of Care: Level of care: Med-Surg Family Communication: Updated patient's daughter on the phone today Disposition Plan:      Remains inpatient appropriate: Hopefully DC home tomorrow if constipation is improved   Procedures:  None  Consultants:   None  Antimicrobials:   Anti-infectives (From admission, onward)    Start     Dose/Rate Route Frequency Ordered Stop   07/02/22 1000  azithromycin (ZITHROMAX) tablet 500 mg  500 mg Oral Daily 07/01/22 1144 07/04/22 0959   06/30/22 1000  cefTRIAXone (ROCEPHIN) 2 g in sodium chloride 0.9 % 100 mL IVPB        2 g 200 mL/hr over 30 Minutes Intravenous Every 24 hours 06/29/22 1231 07/05/22 0959   06/30/22 1000  azithromycin (ZITHROMAX) 500 mg in sodium chloride 0.9 % 250 mL IVPB  Status:  Discontinued        500 mg 250 mL/hr over 60 Minutes Intravenous Every 24 hours 06/29/22 1231 06/29/22 1340   06/29/22 1430  azithromycin (ZITHROMAX) 500 mg in sodium chloride 0.9 % 250 mL IVPB  Status:  Discontinued        500 mg 250 mL/hr over 60 Minutes Intravenous Daily 06/29/22 1340 07/01/22 1144    06/29/22 1030  cefTRIAXone (ROCEPHIN) 1 g in sodium chloride 0.9 % 100 mL IVPB        1 g 200 mL/hr over 30 Minutes Intravenous  Once 06/29/22 1022 06/29/22 1154   06/29/22 1030  doxycycline (VIBRA-TABS) tablet 100 mg        100 mg Oral  Once 06/29/22 1022 06/29/22 1031          Medications  amLODipine  10 mg Oral Daily   aspirin EC  81 mg Oral q AM   azithromycin  500 mg Oral Daily   bisacodyl  10 mg Oral Once   bisoprolol  5 mg Oral Daily   fluticasone  2 spray Each Nare Daily   furosemide  20 mg Oral Daily   guaiFENesin  600 mg Oral BID   insulin aspart  0-5 Units Subcutaneous QHS   insulin aspart  0-9 Units Subcutaneous TID WC   ipratropium-albuterol  3 mL Nebulization BID   linaclotide  290 mcg Oral QAC breakfast   loratadine  10 mg Oral Daily   mometasone-formoterol  2 puff Inhalation BID   pantoprazole  40 mg Oral Q0600   polyethylene glycol  17 g Oral BID   psyllium  1 packet Oral Daily   sorbitol, milk of mag, mineral oil, glycerin (SMOG) enema  960 mL Rectal Once      Subjective:   Stacey Armstrong was seen and examined today.  Per patient, severe constipation, no BM in the last 1 week.  No nausea vomiting or abdominal pain.  No fevers.  Sitting up in the chair, tolerating diet.  No significant chest pain or shortness of breath.   Objective:   Vitals:   07/01/22 1350 07/01/22 2014 07/02/22 0459 07/02/22 0728  BP:  (!) 146/100 (!) 183/57   Pulse:  (!) 58 64   Resp:  16 20   Temp:  98.2 F (36.8 C) 98 F (36.7 C)   TempSrc:   Oral   SpO2: 96% 100% 95% 97%    Intake/Output Summary (Last 24 hours) at 07/02/2022 1316 Last data filed at 07/02/2022 0900 Gross per 24 hour  Intake 830 ml  Output 2550 ml  Net -1720 ml     Wt Readings from Last 3 Encounters:  05/16/22 69.9 kg  03/27/22 69.9 kg  03/12/22 70.2 kg     Exam General: Alert and oriented x 3, NAD Cardiovascular: S1 S2 auscultated,  RRR Respiratory: Clear to auscultation bilaterally, no  wheezing Gastrointestinal: Soft, nontender, nondistended, + bowel sounds Ext: no pedal edema bilaterally Neuro: no new FNDs Skin: No rashes Psych: Normal affect     Data Reviewed:  I have personally reviewed following labs    CBC  Lab Results  Component Value Date   WBC 8.0 07/02/2022   RBC 3.92 07/02/2022   HGB 10.7 (L) 07/02/2022   HCT 34.4 (L) 07/02/2022   MCV 87.8 07/02/2022   MCH 27.3 07/02/2022   PLT 242 07/02/2022   MCHC 31.1 07/02/2022   RDW 15.0 07/02/2022   LYMPHSABS 1.1 07/02/2022   MONOABS 0.9 07/02/2022   EOSABS 0.3 07/02/2022   BASOSABS 0.0 34/75/8307     Last metabolic panel Lab Results  Component Value Date   NA 136 07/02/2022   K 3.6 07/02/2022   CL 101 07/02/2022   CO2 25 07/02/2022   BUN 18 07/02/2022   CREATININE 1.13 (H) 07/02/2022   GLUCOSE 119 (H) 07/02/2022   GFRNONAA 46 (L) 07/02/2022   GFRAA >60 01/24/2019   CALCIUM 8.8 (L) 07/02/2022   PHOS 3.0 08/22/2021   PROT 5.5 (L) 06/30/2022   ALBUMIN 2.7 (L) 06/30/2022   BILITOT 1.0 06/30/2022   ALKPHOS 72 06/30/2022   AST 19 06/30/2022   ALT 17 06/30/2022   ANIONGAP 10 07/02/2022    CBG (last 3)  Recent Labs    07/01/22 2016 07/02/22 0733 07/02/22 1154  GLUCAP 158* 122* 94      Coagulation Profile: No results for input(s): "INR", "PROTIME" in the last 168 hours.   Radiology Studies: I have personally reviewed the imaging studies  No results found.     Estill Cotta M.D. [facility] 07/02/2022, 1:16 PM  Available via Epic secure chat 7am-7pm After 7 pm, please refer to night coverage provider listed on amion.

## 2022-07-03 DIAGNOSIS — J111 Influenza due to unidentified influenza virus with other respiratory manifestations: Secondary | ICD-10-CM | POA: Diagnosis not present

## 2022-07-03 DIAGNOSIS — J189 Pneumonia, unspecified organism: Secondary | ICD-10-CM | POA: Diagnosis not present

## 2022-07-03 DIAGNOSIS — D649 Anemia, unspecified: Secondary | ICD-10-CM | POA: Diagnosis not present

## 2022-07-03 DIAGNOSIS — R509 Fever, unspecified: Secondary | ICD-10-CM | POA: Diagnosis not present

## 2022-07-03 DIAGNOSIS — K5641 Fecal impaction: Secondary | ICD-10-CM

## 2022-07-03 LAB — BASIC METABOLIC PANEL
Anion gap: 12 (ref 5–15)
BUN: 26 mg/dL — ABNORMAL HIGH (ref 8–23)
CO2: 25 mmol/L (ref 22–32)
Calcium: 8.9 mg/dL (ref 8.9–10.3)
Chloride: 98 mmol/L (ref 98–111)
Creatinine, Ser: 1.4 mg/dL — ABNORMAL HIGH (ref 0.44–1.00)
GFR, Estimated: 35 mL/min — ABNORMAL LOW (ref >60)
Glucose, Bld: 140 mg/dL — ABNORMAL HIGH (ref 70–99)
Potassium: 3.7 mmol/L (ref 3.5–5.1)
Sodium: 135 mmol/L (ref 135–145)

## 2022-07-03 LAB — GLUCOSE, CAPILLARY
Glucose-Capillary: 84 mg/dL (ref 70–99)
Glucose-Capillary: 129 mg/dL — ABNORMAL HIGH (ref 70–99)

## 2022-07-03 MED ORDER — GUAIFENESIN ER 600 MG PO TB12: 600.0000 mg | ORAL_TABLET | Freq: Two times a day (BID) | ORAL | 0 refills | Status: DC

## 2022-07-03 MED ORDER — FUROSEMIDE 20 MG PO TABS: 20.0000 mg | ORAL_TABLET | Freq: Every day | ORAL | 2 refills | Status: DC

## 2022-07-03 MED ORDER — CEFPODOXIME PROXETIL 200 MG PO TABS: 200.0000 mg | ORAL_TABLET | Freq: Two times a day (BID) | ORAL | 0 refills | Status: AC

## 2022-07-03 MED ORDER — PANTOPRAZOLE SODIUM 40 MG PO TBEC: 40.0000 mg | DELAYED_RELEASE_TABLET | Freq: Every day | ORAL | 3 refills | Status: DC

## 2022-07-03 MED ORDER — AZITHROMYCIN 500 MG PO TABS: 500.0000 mg | ORAL_TABLET | Freq: Every day | ORAL | 0 refills | Status: AC

## 2022-07-03 MED ORDER — AMLODIPINE BESYLATE 10 MG PO TABS: 10.0000 mg | ORAL_TABLET | Freq: Every day | ORAL | Status: DC

## 2022-07-03 MED ORDER — LORATADINE 10 MG PO TABS: 10.0000 mg | ORAL_TABLET | Freq: Every day | ORAL | 3 refills | Status: DC

## 2022-07-03 MED ORDER — FLUTICASONE PROPIONATE 50 MCG/ACT NA SUSP: 2.0000 | Freq: Every day | NASAL | 2 refills | Status: DC

## 2022-07-03 MED ORDER — POLYETHYLENE GLYCOL 3350 17 GM/SCOOP PO POWD: 17.0000 g | Freq: Two times a day (BID) | ORAL | 1 refills | Status: AC

## 2022-07-03 MED ORDER — LINACLOTIDE 290 MCG PO CAPS: 290.0000 ug | ORAL_CAPSULE | Freq: Every day | ORAL | 3 refills | Status: DC

## 2022-07-03 MED ORDER — MOMETASONE FURO-FORMOTEROL FUM 200-5 MCG/ACT IN AERO: 2.0000 | INHALATION_SPRAY | Freq: Two times a day (BID) | RESPIRATORY_TRACT | 3 refills | Status: DC

## 2022-07-03 MED ORDER — PSYLLIUM 95 % PO PACK: 1.0000 | PACK | Freq: Every day | ORAL | 3 refills | Status: AC

## 2022-07-03 MED ORDER — BISACODYL 10 MG RE SUPP: 10.0000 mg | RECTAL | 0 refills | Status: DC | PRN

## 2022-07-03 NOTE — Progress Notes (Signed)
Pt refused her breathing tx this am. Pt requested to sleep. No distress noted at this time.

## 2022-07-03 NOTE — Discharge Summary (Signed)
Physician Discharge Summary   Patient: Stacey Armstrong MRN: [id number] DOB: 06/18/1927  Admit date:     06/29/2022  Discharge date: 07/03/22  Discharge Physician: Estill Cotta, MD    PCP: [name], Rayford Halsted, MD   Recommendations at discharge:   Started on bowel regimen with Linzess 290 mcg p.o. daily before breakfast MiraLAX 17 g p.o. twice daily Metamucil 1 packet daily, stay hydrated Dulcolax suppository as needed Continue Zithromax 500 mg p.o. daily for 3 days, Vantin 200 mg p.o. twice daily for 3 days Follow [facility] outpatient, may need to decrease Lasix to QOD  Discharge Diagnoses:    Febrile illness, acute   Severe constipation with fecal impaction   Type 2 diabetes mellitus with hyperlipidemia ([place])   Essential hypertension   Hyperlipidemia   History of CVA (cerebrovascular accident)   SIRS (systemic inflammatory response syndrome) (HCC)   Stage 3b chronic kidney disease (CKD) (HCC)   COPD (chronic obstructive pulmonary disease) (HCC) History of diastolic CHF    Hospital Course: Patient 89 year old female history of CKD stage IIIb, COPD, CVA, diabetes, hypertension, hyperlipidemia presented with fever chills body aches suddenly the night prior to admission with some associated sneezing, sinus congestion but no specific cough. Patient noted to have a fever on presentation. COVID-19 PCR negative, influenza A and B PCR negative, respiratory viral PCR negative, respiratory viral panel negative.  Patient placed on empiric IV antibiotics of Rocephin and azithromycin, admitted for further workup..     Assessment and Plan:    SIRS, Febrile illness, acute -Presented with febrile illness, fever 101.1 F, URI, body aches, chills with sudden onset.  COVID, flu, RSV negative, RVP negative -Chest x-ray showed no acute abnormalities -Blood cultures negative so far.  Urine culture NTD -Urine strep antigen, urine Legionella antigen negative -Patient was placed on IV Rocephin,  Zithromax, transition to oral Vantin and Zithromax x 3 days to complete course.     Essential hypertension, history of diastolic CHF -Continue bisoprolol, Norvasc -Received Lasix IV x 2 doses in ED and home regimen 20 mg daily resumed   Constipation, longstanding history with fecal impaction -Has chronic constipation, fecal impaction follows GI, Dr. Havery Moros -Patient has been taking MiraLAX and Senokot as at home with no significant improvement.  Was seen on 05/16/2022 by GI outpatient was recommended to increase MiraLAX twice daily, Elavil was discontinued. -Per patient, no BM for last 1 week, abdominal x-ray showed ileus.  CT abdomen showed large stool in the rectum with fecal impaction.  -Patient was placed on Linzess, MiraLAX and also received smog enema x 1 -TSH 1.5, free T41.1 -Started on Linzess 290 mcg daily, MiraLAX 17 g p.o. twice daily, Metamucil 1 packet daily and stay hydrated.  Dulcolax suppository as needed   History of CVA -Continue aspirin -Resume statin   Hyperlipidemia  -Resume statin.   CKD stage IIIb -Creatinine 1.1 -Creatinine 1.4 at discharge, likely due to IV Lasix, patient recommended to hold Lasix for 2 days.   Diabetes mellitus type 2, with complications CKD stage IIIb -Hemoglobin A1c 6.4 on 2//24    COPD -Currently stable, continue Dulera, Claritin, bronchodilators   GERD -Continue PPI  Management and plan discussed with patient's daughter on the phone today with recommendation for hydration and good bowel regimen.  Outpatient follow-up with GI, Dr. Havery Moros in 2 weeks     Pain control - [place] was reviewed. and patient was instructed, not to drive, operate heavy machinery, perform activities at heights,  swimming or participation in water activities or provide baby-sitting services while on Pain, Sleep and Anxiety Medications; until their outpatient Physician has advised to do so again.  Also recommended to not to take more than prescribed Pain, Sleep and Anxiety Medications.  Consultants: None Procedures performed:None Disposition: Home Diet recommendation:  Discharge Diet Orders (From admission, onward)     Start     Ordered   07/03/22 0000  Diet Carb Modified        07/03/22 1144           Carb modified diet DISCHARGE MEDICATION: Allergies as of 07/03/2022       Reactions   Penicillins Hives   Bactrim [sulfamethoxazole-trimethoprim] Nausea And Vomiting   Coconut Oil Hives, Swelling, Rash   Cocos Nucifera Hives, Swelling, Rash   Other Hives, Swelling, Rash   Berries         Medication List     STOP taking these medications    amitriptyline 25 MG tablet Commonly known as: ELAVIL   budesonide 0.25 MG/2ML nebulizer solution Commonly known as: PULMICORT   senna-docusate 8.6-50 MG tablet Commonly known as: Senokot-S       TAKE these medications    Accu-Chek FastClix Lancets Misc USE AS DIRECTED   albuterol 108 (90 Base) MCG/ACT inhaler Commonly known as: VENTOLIN HFA Inhale 2 puffs into the lungs every 6 (six) hours as needed for wheezing or shortness of breath. What changed: when to take this   amLODipine 10 MG tablet Commonly known as: NORVASC Take 1 tablet (10 mg total) by mouth daily.   aspirin EC 81 MG tablet Take 81 mg by mouth in the morning.   aspirin-acetaminophen-caffeine 250-250-65 MG tablet Commonly known as: EXCEDRIN MIGRAINE Take 1-2 tablets by mouth daily as needed for headache or migraine.   atorvastatin 20 MG tablet Commonly known as: LIPITOR Take 1 tablet (20 mg total) by mouth at bedtime.   azithromycin 500 MG tablet Commonly known as: Zithromax Take 1 tablet (500 mg total) by mouth daily for 3 days. Take 1 tablet daily for 3 days.   bisacodyl 10 MG suppository Commonly known as: DULCOLAX Place 1 suppository (10 mg total) rectally as needed for moderate constipation. Available OTC.   bisoprolol 5 MG  tablet Commonly known as: ZEBETA TAKE 1 TABLET EVERY DAY   cefpodoxime 200 MG tablet Commonly known as: VANTIN Take 1 tablet (200 mg total) by mouth 2 (two) times daily for 3 days.   Fish Oil 1000 MG Caps Take 2,000 mg by mouth daily.   fluticasone 50 MCG/ACT nasal spray Commonly known as: FLONASE Place 2 sprays into both nostrils daily. Start taking on: July 04, 2022   furosemide 20 MG tablet Commonly known as: Lasix Take 1 tablet (20 mg total) by mouth daily. Start taking on: July 05, 2022 What changed: These instructions start on July 05, 2022. If you are unsure what to do until then, ask your doctor or other care provider.   guaiFENesin 600 MG 12 hr tablet Commonly known as: MUCINEX Take 1 tablet (600 mg total) by mouth 2 (two) times daily.   ipratropium 0.03 % nasal spray Commonly known as: ATROVENT Place 2 sprays into both nostrils 3 (three) times daily as needed for rhinitis.   linaclotide 290 MCG Caps capsule Commonly known as: LINZESS Take 1 capsule (290 mcg total) by mouth daily before breakfast. Start taking on: July 04, 2022   loratadine 10 MG tablet Commonly known as: CLARITIN Take 1 tablet (10  mg total) by mouth daily. Start taking on: July 04, 2022   mometasone-formoterol 200-5 MCG/ACT Aero Commonly known as: DULERA Inhale 2 puffs into the lungs 2 (two) times daily.   multivitamin with minerals Tabs tablet Take 1 tablet by mouth daily. Centrum Silver   mupirocin ointment 2 % Commonly known as: BACTROBAN Apply 1 Application topically as needed (anti-fungal bacteria).   pantoprazole 40 MG tablet Commonly known as: PROTONIX Take 1 tablet (40 mg total) by mouth daily.   polyethylene glycol powder 17 GM/SCOOP powder Commonly known as: GLYCOLAX/MIRALAX Take 17 g by mouth 2 (two) times daily. What changed:  when to take this reasons to take this   potassium chloride SA 20 MEQ tablet Commonly known as: KLOR-CON M Take 1 tablet  (20 mEq total) by mouth daily.   PRESERVISION AREDS 2 PO Take 1 tablet by mouth in the morning and at bedtime.   PROBIOTIC PO Take 1 capsule by mouth daily.   psyllium 95 % Pack Commonly known as: HYDROCIL/METAMUCIL Take 1 packet by mouth daily. Start taking on: July 04, 2022   Systane Ultra 0.4-0.3 % Soln Generic drug: Polyethyl Glycol-Propyl Glycol Place 1-2 drops into both eyes 3 (three) times daily as needed (dry/irritated eyes.).   True Metrix Blood Glucose Test test strip Generic drug: glucose blood TEST BLOOD SUGAR EVERY DAY   True Metrix Meter Devi 1 each by Does not apply route daily.   Vitamin D3 50 MCG (2000 UT) Tabs Take 2,000 Units by mouth in the morning.        Follow-up Information     [name], Rayford Halsted, MD. Schedule an appointment as soon as possible for a visit in 2 week(s).   Specialty: Internal Medicine Why: for hospital follow-up Contact information: [place] [address] [phone]         Yetta Flock, MD. Schedule an appointment as soon as possible for a visit in 2 week(s).   Specialty: Gastroenterology Why: for hospital follow-up Contact information: [place] [address] [phone]                Discharge Exam: Danley Danker Weights   07/03/22 0500  Weight: 72.5 kg   S: Had a large BM last night, was disimpacted and 1 BM this morning.  Feels better and sleepy.  No acute complaints.  Looking forward to go home today.  BP (!) 170/58 (BP Location: Left Arm)   Pulse 66   Temp 98 F (36.7 C) (Oral)   Resp 18   Wt 72.5 kg   SpO2 99%   BMI 27.44 kg/m   Physical Exam General: Alert and oriented x 3, NAD Cardiovascular: S1 S2 clear, RRR.  Respiratory: CTAB, no wheezing, rales or rhonchi Gastrointestinal: Soft, nontender, nondistended, NBS Ext: no pedal edema bilaterally Neuro: no new deficits Psych: Normal affect   Condition at discharge: fair  The  results of significant diagnostics from this hospitalization (including imaging, microbiology, ancillary and laboratory) are listed below for reference.   Imaging Studies: CT ABDOMEN PELVIS WO CONTRAST  Result Date: 07/02/2022 CLINICAL DATA:  89 year old female presents for evaluation of bowel obstruction. EXAM: CT ABDOMEN AND PELVIS WITHOUT CONTRAST TECHNIQUE: Multidetector CT imaging of the abdomen and pelvis was performed following the standard protocol without IV contrast. RADIATION DOSE REDUCTION: This exam was performed according to the departmental dose-optimization program which includes automated exposure control, adjustment of the mA and/or kV according to patient size and/or  use of iterative reconstruction technique. COMPARISON:  August 20, 2021 FINDINGS: Lower chest: Small bilateral pleural effusions. Basilar volume loss and mild septal thickening. Coronary artery calcification of LEFT and RIGHT coronary circulation. No pericardial effusion. Heart is incompletely imaged. No chest wall abnormality. Hepatobiliary: Smooth hepatic contours. No pericholecystic stranding. No gross biliary duct distension. Smooth hepatic contours. No visible lesion on noncontrast imaging. Post cholecystectomy without gross biliary duct distension. Pancreas: Fatty atrophy of the pancreas without adjacent stranding or fluid. Spleen: Normal. Adrenals/Urinary Tract: Adrenal glands are normal. Cortical scarring of the bilateral kidneys and nephrolithiasis on the LEFT similar to prior imaging. Removal of nephroureteral sent seen on previous imaging. No ureteral calculi. Renal pelvic calculus on the prior study in the RIGHT renal pelvis has been removed. Small RIGHT intrarenal calculus in the lower pole of the RIGHT kidney. Trabeculated urinary bladder without substantial adjacent stranding. No hydronephrosis. Stomach/Bowel: Small to moderate hiatal hernia. No signs upstream bowel dilation but large volume stool filling the rectum  measuring 9.9 x 9.2 cm. Despite the large volume of stool in the rectum there is evidence of rectal wall thickening which is circumferential. Extensive diverticular changes of the colon. Appendix not visible, no secondary signs that would suggest acute appendiceal process. Vascular/Lymphatic: Aortic atherosclerosis. No sign of aneurysm. Smooth contour of the IVC. There is no gastrohepatic or hepatoduodenal ligament lymphadenopathy. No retroperitoneal or mesenteric lymphadenopathy. No pelvic sidewall lymphadenopathy. Atherosclerotic changes are moderate. Reproductive: Post hysterectomy. Other: No ascites or pneumoperitoneum. Musculoskeletal: Mild stranding about the greater trochanter bilaterally and in the subcutaneous fat along the dependent body wall in gluteal regions. IMPRESSION: 1. Large volume stool filling the rectum measuring 9.9 x 9.2 cm. Despite the large volume of stool in the rectum there is evidence of rectal wall thickening which is circumferential. Findings are suspicious for fecal impaction and stercoral proctitis. Only mild distension of upstream bowel without frank evidence of obstruction. 2. Extensive diverticular changes of the colon without evidence of acute diverticulitis. 3. Small to moderate hiatal hernia. 4. Small bilateral pleural effusions with basilar volume loss and mild septal thickening. Correlate with any clinical or laboratory evidence of heart failure. 5. Cortical scarring of the bilateral kidneys with nephrolithiasis. Removal of RIGHT nephroureteral stent and RIGHT renal pelvic calculus. 6. Coronary artery calcification of LEFT and RIGHT coronary circulation. 7. Aortic atherosclerosis. 8. Mild stranding about the greater trochanter bilaterally and in the subcutaneous fat along the dependent body wall in gluteal regions. Correlate with any clinical evidence of cellulitis or developing pressure ulcers. Aortic Atherosclerosis (ICD10-I70.0). Electronically Signed   By: Zetta Bills  M.D.   On: 07/02/2022 18:56   DG Abd Portable 2V  Result Date: 07/02/2022 CLINICAL DATA:  Ileus. EXAM: PORTABLE ABDOMEN - 2 VIEW COMPARISON:  04/25/2022 FINDINGS: No significant stool burden. Mild to moderate gaseous distention of colon and air throughout the small bowel suggesting diffuse ileus. No free air is identified. The soft tissue shadows are grossly maintained. Bibasilar scarring changes. The bony structures are unremarkable. IMPRESSION: 1. Diffuse ileus. 2. No significant stool burden. Electronically Signed   By: Marijo Sanes M.D.   On: 07/02/2022 14:15   DG Chest Port 1 View  Result Date: 06/29/2022 CLINICAL DATA:  Fever with body aches EXAM: PORTABLE CHEST 1 VIEW COMPARISON:  03/10/2022 FINDINGS: Low volume chest with interstitial coarsening at the bases, similar to priors. Aeration is better than the most recent comparison. No edema, effusion, or pneumothorax. Normal heart size. IMPRESSION: Chronic interstitial opacity at the bases.  No focal pneumonia. Electronically Signed   By: Jorje Guild M.D.   On: 06/29/2022 07:42    Microbiology: Results for orders placed or performed during the hospital encounter of 06/29/22  Resp panel by RT-PCR (RSV, Flu A&B, Covid) Anterior Nasal Swab     Status: None   Collection Time: 06/29/22  5:51 AM   Specimen: Anterior Nasal Swab  Result Value Ref Range Status   SARS Coronavirus 2 by RT PCR NEGATIVE NEGATIVE Final    Comment: (NOTE) SARS-CoV-2 target nucleic acids are NOT DETECTED.  The SARS-CoV-2 RNA is generally detectable in upper respiratory specimens during the acute phase of infection. The lowest concentration of SARS-CoV-2 viral copies this assay can detect is 138 copies/mL. A negative result does not preclude SARS-Cov-2 infection and should not be used as the sole basis for treatment or other patient management decisions. A negative result may occur with  improper specimen collection/handling, submission of specimen other than  nasopharyngeal swab, presence of viral mutation(s) within the areas targeted by this assay, and inadequate number of viral copies(<138 copies/mL). A negative result must be combined with clinical observations, patient history, and epidemiological information. The expected result is Negative.  Fact Sheet for Patients:  EntrepreneurPulse.com.au  Fact Sheet for Healthcare Providers:  IncredibleEmployment.be  This test is no t yet approved or cleared by the [place] FDA and  has been authorized for detection and/or diagnosis of SARS-CoV-2 by FDA under an Emergency Use Authorization (EUA). This EUA will remain  in effect (meaning this test can be used) for the duration of the COVID-19 declaration under Section 564(b)(1) of the Act, 21 U.S.C.section 360bbb-3(b)(1), unless the authorization is terminated  or revoked sooner.       Influenza A by PCR NEGATIVE NEGATIVE Final   Influenza B by PCR NEGATIVE NEGATIVE Final    Comment: (NOTE) The Xpert Xpress SARS-CoV-2/FLU/RSV plus assay is intended as an aid in the diagnosis of influenza from Nasopharyngeal swab specimens and should not be used as a sole basis for treatment. Nasal washings and aspirates are unacceptable for Xpert Xpress SARS-CoV-2/FLU/RSV testing.  Fact Sheet for Patients: EntrepreneurPulse.com.au  Fact Sheet for Healthcare Providers: IncredibleEmployment.be  This test is not yet approved or cleared by the [place] FDA and has been authorized for detection and/or diagnosis of SARS-CoV-2 by FDA under an Emergency Use Authorization (EUA). This EUA will remain in effect (meaning this test can be used) for the duration of the COVID-19 declaration under Section 564(b)(1) of the Act, 21 U.S.C. section 360bbb-3(b)(1), unless the authorization is terminated or revoked.     Resp Syncytial Virus by PCR NEGATIVE NEGATIVE Final    Comment:  (NOTE) Fact Sheet for Patients: EntrepreneurPulse.com.au  Fact Sheet for Healthcare Providers: IncredibleEmployment.be  This test is not yet approved or cleared by the [place] FDA and has been authorized for detection and/or diagnosis of SARS-CoV-2 by FDA under an Emergency Use Authorization (EUA). This EUA will remain in effect (meaning this test can be used) for the duration of the COVID-19 declaration under Section 564(b)(1) of the Act, 21 U.S.C. section 360bbb-3(b)(1), unless the authorization is terminated or revoked.  Performed at [facility], [place] [address], [place], [place] [address]   Respiratory (~20 pathogens) panel by PCR     Status: None   Collection Time: 06/29/22  6:41 AM   Specimen: Nasopharyngeal Swab; Respiratory  Result Value Ref Range Status   Adenovirus NOT DETECTED NOT DETECTED Final   Coronavirus 229E NOT  DETECTED NOT DETECTED Final    Comment: (NOTE) The Coronavirus on the Respiratory Panel, DOES NOT test for the novel  Coronavirus (2019 nCoV)    Coronavirus HKU1 NOT DETECTED NOT DETECTED Final   Coronavirus NL63 NOT DETECTED NOT DETECTED Final   Coronavirus OC43 NOT DETECTED NOT DETECTED Final   Metapneumovirus NOT DETECTED NOT DETECTED Final   Rhinovirus / Enterovirus NOT DETECTED NOT DETECTED Final   Influenza A NOT DETECTED NOT DETECTED Final   Influenza B NOT DETECTED NOT DETECTED Final   Parainfluenza Virus 1 NOT DETECTED NOT DETECTED Final   Parainfluenza Virus 2 NOT DETECTED NOT DETECTED Final   Parainfluenza Virus 3 NOT DETECTED NOT DETECTED Final   Parainfluenza Virus 4 NOT DETECTED NOT DETECTED Final   Respiratory Syncytial Virus NOT DETECTED NOT DETECTED Final   Bordetella pertussis NOT DETECTED NOT DETECTED Final   Bordetella Parapertussis NOT DETECTED NOT DETECTED Final   Chlamydophila pneumoniae NOT DETECTED NOT DETECTED Final   Mycoplasma pneumoniae NOT DETECTED NOT DETECTED  Final    Comment: Performed at [facility], [place] [address] [place], [place] [address]  Urine Culture (for pregnant, neutropenic or urologic patients or patients with an indwelling urinary catheter)     Status: None   Collection Time: 06/29/22  9:09 AM   Specimen: Urine, Catheterized  Result Value Ref Range Status   Specimen Description   Final    URINE, CATHETERIZED Performed at [facility] [address], [place], [place] [address]    Special Requests   Final    NONE Performed at [facility], [place] [address] [place], [place] [address]    Culture   Final    NO GROWTH Performed at [facility], [place] [address], [place], [place] [address]    Report Status 07/01/2022 FINAL  Final  Culture, blood (Routine X 2) w Reflex to ID Panel     Status: None (Preliminary result)   Collection Time: 06/29/22 12:10 PM   Specimen: BLOOD LEFT HAND  Result Value Ref Range Status   Specimen Description   Final    BLOOD LEFT HAND Performed at [facility], [place] [address] [place], [place] [address]    Special Requests   Final    BOTTLES DRAWN AEROBIC AND ANAEROBIC Blood Culture results may not be optimal due to an excessive volume of blood received in culture bottles Performed at [facility] [address], [place], [place] [address]    Culture   Final    NO GROWTH 4 DAYS Performed at [facility], [place] [address], [place], [place] [address]    Report Status PENDING  Incomplete  Culture, blood (Routine X 2) w Reflex to ID Panel     Status: None (Preliminary result)   Collection Time: 06/29/22  1:15 PM   Specimen: BLOOD  Result Value Ref Range Status   Specimen Description   Final    BLOOD BLOOD RIGHT ARM Performed at [facility], [place] [address], [place], [place] [address]    Special Requests   Final    BOTTLES DRAWN AEROBIC ONLY Blood Culture adequate volume Performed at [facility] [address], [place], [place] [address]    Culture   Final    NO GROWTH 4 DAYS Performed at [facility], [place] [address], [place], [place] [address]    Report Status PENDING  Incomplete    Labs:  CBC: Recent Labs  Lab 06/29/22 0641 06/30/22 0453 07/01/22 0456 07/02/22 0606  WBC 17.5* 13.5* 8.6 8.0  NEUTROABS 15.0*  --  6.2 5.6  HGB 11.4* 10.3* 10.4* 10.7*  HCT 36.3 31.8* 33.9* 34.4*  MCV 86.6 87.8 88.1 87.8  PLT 236 206 216 784   Basic Metabolic Panel: Recent Labs  Lab 06/29/22 0641 06/30/22 0453 07/01/22 0456 07/02/22 0606 07/03/22 0440  NA 135 135 138 136 135  K 3.9 4.0 4.0 3.6 3.7  CL 101 101 106 101 98  CO2 $Re'22 24 24 25 25  'pks$ GLUCOSE 180* 134* 122* 119* 140*  BUN $Re'22 18 19 18 'FPF$ 26*  CREATININE 1.27* 1.09* 1.11* 1.13* 1.40*  CALCIUM 8.9 8.5* 8.6* 8.8* 8.9  MG  --   --  2.1  --   --    Liver Function Tests: Recent Labs  Lab 06/30/22 0453  AST 19  ALT 17  ALKPHOS 72  BILITOT 1.0  PROT 5.5*  ALBUMIN 2.7*   CBG: Recent Labs  Lab 07/02/22 1154 07/02/22 1650 07/02/22 2212 07/03/22 0752 07/03/22 1124  GLUCAP 94 96 160* 84 129*    Discharge time spent: greater than 30 minutes.  Signed: Estill Cotta, MD [facility] 07/03/2022

## 2022-07-04 ENCOUNTER — Telehealth: Payer: Self-pay

## 2022-07-04 LAB — CULTURE, BLOOD (ROUTINE X 2)
Culture: NO GROWTH
Culture: NO GROWTH
Special Requests: ADEQUATE

## 2022-07-04 NOTE — Patient Outreach (Signed)
  Care Coordination TOC Note Transition Care Management Follow-up Telephone Call Date of discharge and from where: 07/03/22-[facility]   Dx: "influenza like illness" How have you been since you were released from the hospital? Patient states she is "doing okay-working on straightening out her meds due to med changes." She states she takes pills directly from bottle but has pill box if needed. Her daughter assists with med mgmt. She rested well last night. Patient voices she is getting ready to eat a bowl of soup for lunch. She has occasional cough-productive at times. She voices she is taking abx therapy as ordered.  Any questions or concerns? Yes-Patient with questions regarding dosage and instructions for Lasix. Reviewed with patient med instructions and dosage  Items Reviewed: Did the pt receive and understand the discharge instructions provided? Yes  Medications obtained and verified? Yes -Patient states daughter picking up some of her meds today Other? Yes  Any new allergies since your discharge? No  Dietary orders reviewed? Yes Do you have support at home? Yes   Home Care and Equipment/Supplies: Were home health services ordered? Yes-[facility] If so, what is the name of the agency? [facility]  Has the agency set up a time to come to the patient's home? no Were any new equipment or medical supplies ordered?  No What is the name of the medical supply agency? N/A Were you able to get the supplies/equipment? not applicable Do you have any questions related to the use of the equipment or supplies? No  Functional Questionnaire: (I = Independent and D = Dependent) ADLs: A  Bathing/Dressing- A  Meal Prep- A  Eating- I  Maintaining continence- I  Transferring/Ambulation- I  Managing Meds- A  Follow up appointments reviewed:  PCP Hospital f/u appt confirmed? No  -Patient voices daughter makes appt and will call office [facility] f/u appt confirmed? No--Patient voices  daughter makes appt and will call office Are transportation arrangements needed? No  If their condition worsens, is the pt aware to call PCP or go to the Emergency Dept.? Yes Was the patient provided with contact information for the PCP's office or ED? Yes Was to pt encouraged to call back with questions or concerns? Yes  SDOH assessments and interventions completed:   Yes SDOH Interventions Today    Flowsheet Row Most Recent Value  SDOH Interventions   Food Insecurity Interventions Intervention Not Indicated  Transportation Interventions Intervention Not Indicated       Care Coordination Interventions:  Interventions Today    Flowsheet Row Most Recent Value  Education Interventions   Education Provided Provided Verbal Education  [resp mgmt]  Provided Verbal Education On Nutrition, Medication  Nutrition Interventions   Nutrition Discussed/Reviewed Nutrition Discussed  Safety Interventions   Safety Discussed/Reviewed Safety Discussed       TOC Interventions Today    Flowsheet Row Most Recent Value  TOC Interventions   TOC Interventions Discussed/Reviewed TOC Interventions Discussed        Encounter Outcome:  Pt. Visit Completed    Enzo Montgomery, RN,BSN,CCM [place] Management Telephonic Care Management Coordinator Direct Phone: [phone] Toll Free: [phone] Fax: [phone]

## 2022-07-04 NOTE — Patient Outreach (Signed)
  Care Coordination TOC Note Transition Care Management Unsuccessful Follow-up Telephone Call  Date of discharge and from where:  07/03/22-[facility]   Attempts:  1st Attempt  Reason for unsuccessful TCM follow-up call:  Left voice message    Hetty Blend [place] Management Telephonic Care Management Coordinator Direct Phone: [phone] Toll Free: [phone] Fax: [phone]

## 2022-07-07 ENCOUNTER — Ambulatory Visit: Payer: Medicare HMO | Admitting: Physical Therapy

## 2022-07-07 NOTE — Therapy (Deleted)
OUTPATIENT PHYSICAL THERAPY FEMALE PELVIC EVALUATION   Patient Name: Stacey Armstrong MRN: [id number] DOB:[birth year], 89 y.o., female Today's Date: 07/07/2022  END OF SESSION:   Past Medical History:  Diagnosis Date   Arthritis    Complication of anesthesia    after gallbladder surgery with extubation had shallow respirations,  needed to continue intudation for additonal 10-12 days   Diabetes ([place])    DVT (deep venous thrombosis) ([place])    left leg after hysterectomy   GERD (gastroesophageal reflux disease)    History of COVID-19 05/2021   History of kidney stones    History of transient ischemic attack (TIA)    HOH (hard of hearing)    Hyperlipidemia    Hypertension    Kidney stones 2023   Sleep apnea    Past Surgical History:  Procedure Laterality Date   ABDOMINAL HYSTERECTOMY     APPENDECTOMY     CATARACT EXTRACTION, BILATERAL     CHOLECYSTECTOMY     COLONOSCOPY     CYSTOSCOPY WITH STENT PLACEMENT Right 08/14/2021   Procedure: CYSTOSCOPY WITH STENT PLACEMENT;  Surgeon: Ardis Hughs, MD;  Location: [facility];  Service: Urology;  Laterality: Right;  ONLY NEEDS 30 MIN   EXTRACORPOREAL SHOCK WAVE LITHOTRIPSY Right 02/06/2022   Procedure: RIGHT EXTRACORPOREAL SHOCK WAVE LITHOTRIPSY (ESWL);  Surgeon: Ardis Hughs, MD;  Location: [facility];  Service: Urology;  Laterality: Right;   Patient Active Problem List   Diagnosis Date Noted   Constipation 07/01/2022   Febrile illness, acute 06/29/2022   SIRS (systemic inflammatory response syndrome) ([place]) 06/29/2022   Stage 3b chronic kidney disease (CKD) ([place]) 06/29/2022   COPD (chronic obstructive pulmonary disease) ([place]) 06/29/2022   Acute on chronic diastolic CHF (congestive heart failure) ([place]) 03/12/2022   Acute respiratory failure with hypoxia ([place]) 03/09/2022   Bilateral pleural effusion 03/08/2022   Hypertensive urgency 03/08/2022   History of CVA (cerebrovascular accident) 03/08/2022   Acute  respiratory failure ([place]) 09/07/2021   Acute viral bronchitis 09/06/2021   CKD (chronic kidney disease) stage 4, GFR 15-29 ml/min ([place]) 09/06/2021   Pulmonary nodule 09/06/2021   Weakness 09/06/2021   Bronchitis 09/05/2021   AKI (acute kidney injury) ([place]) 08/20/2021   Hyperlipidemia 08/20/2021   Hyponatremia 08/20/2021   Anemia in chronic renal disease 08/20/2021   Urolithiasis 08/20/2021   Mild protein malnutrition ([place]) 08/20/2021   Contact dermatitis 07/10/2020   Presbycusis of both ears 07/10/2020   Vasomotor rhinitis 07/10/2020   Fecal impaction ([place]) 12/07/2018   Type 2 diabetes mellitus with hyperlipidemia ([place]) 12/07/2018   Essential hypertension 12/07/2018   Diarrhea 12/07/2018   Nausea and vomiting 12/07/2018   ARF (acute renal failure) ([place]) 12/07/2018    PCP: ***  REFERRING PROVIDER: Yetta Flock, MD   REFERRING DIAG:  K59.09 (ICD-10-CM) - Chronic constipation  R15.9 (ICD-10-CM) - Incontinence of feces, unspecified fecal incontinence type  R32 (ICD-10-CM) - Urinary incontinence, unspecified type    THERAPY DIAG:  No diagnosis found.  Rationale for Evaluation and Treatment: Rehabilitation  ONSET DATE: ***  SUBJECTIVE:  SUBJECTIVE STATEMENT: *** Fluid intake: {Yes/No:304960894}   PAIN:  Are you having pain? {yes/no:20286} NPRS scale: ***/10 Pain location: {pelvic pain location:27098}  Pain type: {type:313116} Pain description: {PAIN DESCRIPTION:21022940}   Aggravating factors: *** Relieving factors: ***  PRECAUTIONS: {Therapy precautions:24002}  WEIGHT BEARING RESTRICTIONS: {Yes ***/No:24003}  FALLS:  Has patient fallen in last 6 months? {fallsyesno:27318}  LIVING ENVIRONMENT: Lives with: {OPRC lives with:25569::"lives with their family"} Lives in:  {Lives in:25570} Stairs: {opstairs:27293} Has following equipment at home: {Assistive devices:23999}  OCCUPATION: ***  PLOF: {PLOF:24004}  PATIENT GOALS: ***  PERTINENT HISTORY:  *** Sexual abuse: {Yes/No:304960894}  BOWEL MOVEMENT: Pain with bowel movement: {yes/no:20286} Type of bowel movement:{PT BM type:27100} Fully empty rectum: {Yes/No:304960894} Leakage: {Yes/No:304960894} Pads: {Yes/No:304960894} Fiber supplement: {Yes/No:304960894}  URINATION: Pain with urination: {yes/no:20286} Fully empty bladder: {Yes/No:304960894} Stream: {PT urination:27102} Urgency: {Yes/No:304960894} Frequency: *** Leakage: {PT leakage:27103} Pads: {Yes/No:304960894}  INTERCOURSE: Pain with intercourse: {pain with intercourse PA:27099} Ability to have vaginal penetration:  {Yes/No:304960894} Climax: *** Marinoff Scale: ***/3  PREGNANCY: Vaginal deliveries *** Tearing {Yes***/No:304960894} C-section deliveries *** Currently pregnant {Yes***/No:304960894}  PROLAPSE: {PT prolapse:27101}   OBJECTIVE:   DIAGNOSTIC FINDINGS:  ***  PATIENT SURVEYS:  {rehab surveys:24030}  PFIQ-7 ***  COGNITION: Overall cognitive status: {cognition:24006}     SENSATION: Light touch: {intact/deficits:24005} Proprioception: {intact/deficits:24005}  MUSCLE LENGTH: Hamstrings: Right *** deg; Left *** deg Thomas test: Right *** deg; Left *** deg  LUMBAR SPECIAL TESTS:  {lumbar special test:25242}  FUNCTIONAL TESTS:  {Functional tests:24029}  GAIT: Distance walked: *** Assistive device utilized: {Assistive devices:23999} Level of assistance: {Levels of assistance:24026} Comments: ***  POSTURE: {posture:25561}  PELVIC ALIGNMENT:  LUMBARAROM/PROM:  A/PROM A/PROM  eval  Flexion   Extension   Right lateral flexion   Left lateral flexion   Right rotation   Left rotation    (Blank rows = not tested)  LOWER EXTREMITY ROM:  {AROM/PROM:27142} ROM Right eval Left eval  Hip  flexion    Hip extension    Hip abduction    Hip adduction    Hip internal rotation    Hip external rotation    Knee flexion    Knee extension    Ankle dorsiflexion    Ankle plantarflexion    Ankle inversion    Ankle eversion     (Blank rows = not tested)  LOWER EXTREMITY MMT:  MMT Right eval Left eval  Hip flexion    Hip extension    Hip abduction    Hip adduction    Hip internal rotation    Hip external rotation    Knee flexion    Knee extension    Ankle dorsiflexion    Ankle plantarflexion    Ankle inversion    Ankle eversion     PALPATION:   General  ***                External Perineal Exam ***                             Internal Pelvic Floor ***  Patient confirms identification and approves PT to assess internal pelvic floor and treatment {yes/no:20286}  PELVIC MMT:   MMT eval  Vaginal   Internal Anal Sphincter   External Anal Sphincter   Puborectalis   Diastasis Recti   (Blank rows = not tested)        TONE: ***  PROLAPSE: ***  TODAY'S TREATMENT:  DATE: ***  EVAL ***   PATIENT EDUCATION:  Education details: *** Person educated: {Person educated:25204} Education method: {Education Method:25205} Education comprehension: {Education Comprehension:25206}  HOME EXERCISE PROGRAM: ***  ASSESSMENT:  CLINICAL IMPRESSION: Patient is a *** y.o. *** who was seen today for physical therapy evaluation and treatment for ***.   OBJECTIVE IMPAIRMENTS: {opptimpairments:25111}.   ACTIVITY LIMITATIONS: {activitylimitations:27494}  PARTICIPATION LIMITATIONS: {participationrestrictions:25113}  PERSONAL FACTORS: {Personal factors:25162} are also affecting patient's functional outcome.   REHAB POTENTIAL: {rehabpotential:25112}  CLINICAL DECISION MAKING: {clinical decision making:25114}  EVALUATION COMPLEXITY: {Evaluation  complexity:25115}   GOALS: Goals reviewed with patient? {yes/no:20286}  SHORT TERM GOALS: Target date: ***  *** Baseline: Goal status: {GOALSTATUS:25110}  2.  *** Baseline:  Goal status: {GOALSTATUS:25110}  3.  *** Baseline:  Goal status: {GOALSTATUS:25110}  4.  *** Baseline:  Goal status: {GOALSTATUS:25110}  5.  *** Baseline:  Goal status: {GOALSTATUS:25110}  6.  *** Baseline:  Goal status: {GOALSTATUS:25110}  LONG TERM GOALS: Target date: ***  *** Baseline:  Goal status: {GOALSTATUS:25110}  2.  *** Baseline:  Goal status: {GOALSTATUS:25110}  3.  *** Baseline:  Goal status: {GOALSTATUS:25110}  4.  *** Baseline:  Goal status: {GOALSTATUS:25110}  5.  *** Baseline:  Goal status: {GOALSTATUS:25110}  6.  *** Baseline:  Goal status: {GOALSTATUS:25110}  PLAN:  PT FREQUENCY: {rehab frequency:25116}  PT DURATION: {rehab duration:25117}  PLANNED INTERVENTIONS: {rehab planned interventions:25118::"Therapeutic exercises","Therapeutic activity","Neuromuscular re-education","Balance training","Gait training","Patient/Family education","Self Care","Joint mobilization"}  PLAN FOR NEXT SESSION: ***   Camillo Flaming Railynn Ballo, PT 07/07/2022, 7:47 AM

## 2022-07-11 ENCOUNTER — Telehealth: Payer: Self-pay | Admitting: Gastroenterology

## 2022-07-11 NOTE — Telephone Encounter (Signed)
PT daughter is calling because her mother was hospitalized for 5 days and had a rectal blockage cleaned. She is to be seen within 2 weeks but I was only able to schedule her 3/19. She is requesting if she can be seen earlier. Please advise.

## 2022-07-11 NOTE — Telephone Encounter (Signed)
Lm on vm for patient's daughter Nevin Bloodgood to return call.

## 2022-07-14 ENCOUNTER — Ambulatory Visit (INDEPENDENT_AMBULATORY_CARE_PROVIDER_SITE_OTHER): Payer: Medicare HMO | Admitting: Internal Medicine

## 2022-07-14 VITALS — BP 151/56 | HR 56 | Temp 97.7°F | Wt 152.7 lb

## 2022-07-14 DIAGNOSIS — J208 Acute bronchitis due to other specified organisms: Secondary | ICD-10-CM | POA: Diagnosis not present

## 2022-07-14 DIAGNOSIS — K5641 Fecal impaction: Secondary | ICD-10-CM | POA: Diagnosis not present

## 2022-07-14 DIAGNOSIS — Z09 Encounter for follow-up examination after completed treatment for conditions other than malignant neoplasm: Secondary | ICD-10-CM

## 2022-07-14 DIAGNOSIS — R600 Localized edema: Secondary | ICD-10-CM

## 2022-07-14 MED ORDER — LINACLOTIDE 290 MCG PO CAPS: 290.0000 ug | ORAL_CAPSULE | Freq: Every day | ORAL | 1 refills | Status: DC

## 2022-07-14 MED ORDER — FUROSEMIDE 20 MG PO TABS: 20.0000 mg | ORAL_TABLET | Freq: Every day | ORAL | 1 refills | Status: DC

## 2022-07-14 MED ORDER — MOMETASONE FURO-FORMOTEROL FUM 200-5 MCG/ACT IN AERO: 2.0000 | INHALATION_SPRAY | Freq: Two times a day (BID) | RESPIRATORY_TRACT | 3 refills | Status: DC

## 2022-07-14 NOTE — Telephone Encounter (Signed)
Pt's daughter returned call and pt's appt was rescheduled to 07/16/22 at 9 am with Alonza Bogus, PA-C.

## 2022-07-14 NOTE — Progress Notes (Signed)
Hospital follow-up visit     CC/Reason for Visit: Hospitalization follow-up  HPI: Stacey Armstrong is a 89 y.o. female who is coming in today for the above mentioned reasons, specifically transitional care services face-to-face visit.    Dates hospitalized: 06/29/2022-07/03/2022 Days since discharge from hospital: 11 Patient was discharged from the hospital to: Home Reason for admission to hospital: Fecal impaction, SIRS Date of interactive phone contact with patient and/or caregiver: 07/04/2022  I have reviewed in detail patient's discharge summary plus pertinent specific notes, labs, and images from the hospitalization.  Yes  Patient was admitted with fever and chills.  Found to have multiple SIRS criteria and was admitted on broad-spectrum antibiotic therapy.  All culture data was negative.  She also had significant fecal impaction requiring colonoscopy prep.  She has been having good bowel movements ever since.  She is hesitant to take fiber supplements in addition to [place] as she has been having loose stools since discharge.  Medication reconciliation was done today and patient is taking meds as recommended by discharging hospitalist/specialist.  Yes   Past Medical/Surgical History: Past Medical History:  Diagnosis Date   Arthritis    Complication of anesthesia    after gallbladder surgery with extubation had shallow respirations,  needed to continue intudation for additonal 10-12 days   Diabetes ([place])    DVT (deep venous thrombosis) ([facility])    left leg after hysterectomy   GERD (gastroesophageal reflux disease)    History of COVID-19 05/2021   History of kidney stones    History of transient ischemic attack (TIA)    HOH (hard of hearing)    Hyperlipidemia    Hypertension    Kidney stones 2023   Sleep apnea     Past Surgical History:  Procedure Laterality Date   ABDOMINAL HYSTERECTOMY     APPENDECTOMY     CATARACT EXTRACTION, BILATERAL      CHOLECYSTECTOMY     COLONOSCOPY     CYSTOSCOPY WITH STENT PLACEMENT Right 08/14/2021   Procedure: CYSTOSCOPY WITH STENT PLACEMENT;  Surgeon: Ardis Hughs, MD;  Location: [facility];  Service: Urology;  Laterality: Right;  ONLY NEEDS 30 MIN   EXTRACORPOREAL SHOCK WAVE LITHOTRIPSY Right 02/06/2022   Procedure: RIGHT EXTRACORPOREAL SHOCK WAVE LITHOTRIPSY (ESWL);  Surgeon: Ardis Hughs, MD;  Location: [facility];  Service: Urology;  Laterality: Right;    Social History:  reports that she has quit smoking. She has never used smokeless tobacco. She reports that she does not currently use alcohol. She reports that she does not use drugs.  Allergies: Allergies  Allergen Reactions   Penicillins Hives   Bactrim [Sulfamethoxazole-Trimethoprim] Nausea And Vomiting   Coconut Oil Hives, Swelling and Rash   Other Hives, Swelling and Rash    Berries     Family History:  Family History  Problem Relation Age of Onset   CAD Maternal Grandmother    Heart disease Maternal Grandmother    Pneumonia Maternal Grandfather    Liver disease Neg Hx    Colon cancer Neg Hx    Esophageal cancer Neg Hx      Current Outpatient Medications:    Accu-Chek FastClix Lancets MISC, USE AS DIRECTED, Disp: 102 each, Rfl: 3   amLODipine (NORVASC) 10 MG tablet, Take 1 tablet (10 mg total) by mouth daily., Disp: 30 tablet, Rfl: 2   aspirin EC 81 MG tablet, Take 81 mg by mouth in the morning., Disp: , Rfl:  aspirin-acetaminophen-caffeine (EXCEDRIN MIGRAINE) 250-250-65 MG tablet, Take 1-2 tablets by mouth daily as needed for headache or migraine., Disp: , Rfl:    atorvastatin (LIPITOR) 20 MG tablet, Take 1 tablet (20 mg total) by mouth at bedtime., Disp: 90 tablet, Rfl: 0   bisacodyl (DULCOLAX) 10 MG suppository, Place 1 suppository (10 mg total) rectally as needed for moderate constipation. Available OTC., Disp: 30 suppository, Rfl: 0   bisoprolol (ZEBETA) 5 MG tablet, TAKE 1 TABLET EVERY DAY,  Disp: 90 tablet, Rfl: 1   Blood Glucose Monitoring Suppl (TRUE METRIX METER) DEVI, 1 each by Does not apply route daily., Disp: 1 each, Rfl: 1   Cholecalciferol (VITAMIN D3) 50 MCG (2000 UT) TABS, Take 2,000 Units by mouth in the morning., Disp: , Rfl:    fluticasone (FLONASE) 50 MCG/ACT nasal spray, Place 2 sprays into both nostrils daily., Disp: 16 g, Rfl: 2   furosemide (LASIX) 20 MG tablet, Take 1 tablet (20 mg total) by mouth daily., Disp: 90 tablet, Rfl: 1   guaiFENesin (MUCINEX) 600 MG 12 hr tablet, Take 1 tablet (600 mg total) by mouth 2 (two) times daily., Disp: 30 tablet, Rfl: 0   linaclotide (LINZESS) 290 MCG CAPS capsule, Take 1 capsule (290 mcg total) by mouth daily before breakfast., Disp: 90 capsule, Rfl: 1   loratadine (CLARITIN) 10 MG tablet, Take 1 tablet (10 mg total) by mouth daily., Disp: 30 tablet, Rfl: 3   mometasone-formoterol (DULERA) 200-5 MCG/ACT AERO, Inhale 2 puffs into the lungs 2 (two) times daily., Disp: 1 each, Rfl: 3   Multiple Vitamin (MULTIVITAMIN WITH MINERALS) TABS tablet, Take 1 tablet by mouth daily. Centrum Silver, Disp: , Rfl:    Multiple Vitamins-Minerals (PRESERVISION AREDS 2 PO), Take 1 tablet by mouth in the morning and at bedtime., Disp: , Rfl:    mupirocin ointment (BACTROBAN) 2 %, Apply 1 Application topically as needed (anti-fungal bacteria)., Disp: , Rfl:    Omega-3 Fatty Acids (FISH OIL) 1000 MG CAPS, Take 2,000 mg by mouth daily., Disp: , Rfl:    pantoprazole (PROTONIX) 40 MG tablet, Take 1 tablet (40 mg total) by mouth daily., Disp: 30 tablet, Rfl: 3   Polyethyl Glycol-Propyl Glycol (SYSTANE ULTRA) 0.4-0.3 % SOLN, Place 1-2 drops into both eyes 3 (three) times daily as needed (dry/irritated eyes.)., Disp: , Rfl:    polyethylene glycol powder (GLYCOLAX/MIRALAX) 17 GM/SCOOP powder, Take 17 g by mouth 2 (two) times daily., Disp: 3350 g, Rfl: 1   potassium chloride SA (KLOR-CON M) 20 MEQ tablet, Take 1 tablet (20 mEq total) by mouth daily., Disp: 30  tablet, Rfl: 2   Probiotic Product (PROBIOTIC PO), Take 1 capsule by mouth daily., Disp: , Rfl:    psyllium (HYDROCIL/METAMUCIL) 95 % PACK, Take 1 packet by mouth daily., Disp: 240 each, Rfl: 3   TRUE METRIX BLOOD GLUCOSE TEST test strip, TEST BLOOD SUGAR EVERY DAY, Disp: 100 strip, Rfl: 10  Review of Systems:  Negative except as mentioned in HPI.    Physical Exam: Vitals:   07/14/22 1536 07/14/22 1540  BP: (!) 146/55 (!) 151/56  Pulse: (!) 56   Temp: 97.7 F (36.5 C)   TempSrc: Oral   SpO2: 98%   Weight: 152 lb 11.2 oz (69.3 kg)     Body mass index is 26.21 kg/m.   Physical Exam Vitals reviewed.  Constitutional:      Appearance: Normal appearance.  HENT:     Head: Normocephalic and atraumatic.  Eyes:     Conjunctiva/sclera:  Conjunctivae normal.     Pupils: Pupils are equal, round, and reactive to light.  Cardiovascular:     Rate and Rhythm: Normal rate and regular rhythm.  Pulmonary:     Effort: Pulmonary effort is normal.     Breath sounds: Normal breath sounds.  Skin:    General: Skin is warm and dry.  Neurological:     General: No focal deficit present.     Mental Status: She is alert and oriented to person, place, and time.  Psychiatric:        Mood and Affect: Mood normal.        Behavior: Behavior normal.        Thought Content: Thought content normal.        Judgment: Judgment normal.     Impression and Plan:  Hospital discharge follow-up  Acute viral bronchitis - Plan: mometasone-formoterol (DULERA) 200-5 MCG/ACT AERO  Bilateral leg edema - Plan: furosemide (LASIX) 20 MG tablet  Fecal impaction ([facility]) - Plan: linaclotide (LINZESS) 290 MCG CAPS capsule  [facility] charts have been reviewed in great detail. -Source of SIRS was never clear, she has completed antibiotic therapy. -She again had issues with fecal impaction, she has not been taking fiber supplements that were prescribed.  I have again explained to patient how important it is in her  case to have a good bowel regimen as she has had at least 3 hospitalizations in the past year and a half for fecal impaction.  Prescriptions for Linzess have been sent.  She is also taking MiraLAX. -Lasix prescription sent per request.  Medical decision making of moderate complexity was utilized today.      Lelon Frohlich, MD  [facility]

## 2022-07-16 ENCOUNTER — Ambulatory Visit: Payer: Medicare HMO | Admitting: Gastroenterology

## 2022-07-16 ENCOUNTER — Encounter: Payer: Self-pay | Admitting: Gastroenterology

## 2022-07-16 VITALS — BP 130/62 | HR 64 | Ht 64.0 in | Wt 156.0 lb

## 2022-07-16 DIAGNOSIS — K5909 Other constipation: Secondary | ICD-10-CM

## 2022-07-16 MED ORDER — LINACLOTIDE 145 MCG PO CAPS: 145.0000 ug | ORAL_CAPSULE | Freq: Every day | ORAL | 3 refills | Status: DC

## 2022-07-16 NOTE — Progress Notes (Signed)
07/16/2022 Stacey Armstrong [id number] Sep 17, 1927   HISTORY OF PRESENT ILLNESS: This is a 89 year old female who is known to Dr. Havery Moros from a fairly recent visit in December 2023.  She was seen here for issues with chronic constipation and fecal impactions.  He had recommended MiraLAX once to twice daily, Citrucel once daily, enemas if needed.  They had also discussed possible pelvic floor physical therapy.  A couple of weeks ago she was hospitalized for bronchitis.  While she was there they did a CT scan as below.  CT scan of the abdomen and pelvis without contrast 07/02/2022:  IMPRESSION: 1. Large volume stool filling the rectum measuring 9.9 x 9.2 cm. Despite the large volume of stool in the rectum there is evidence of rectal wall thickening which is circumferential. Findings are suspicious for fecal impaction and stercoral proctitis. Only mild distension of upstream bowel without frank evidence of obstruction. 2. Extensive diverticular changes of the colon without evidence of acute diverticulitis. 3. Small to moderate hiatal hernia. 4. Small bilateral pleural effusions with basilar volume loss and mild septal thickening. Correlate with any clinical or laboratory evidence of heart failure. 5. Cortical scarring of the bilateral kidneys with nephrolithiasis. Removal of RIGHT nephroureteral stent and RIGHT renal pelvic calculus. 6. Coronary artery calcification of LEFT and RIGHT coronary circulation. 7. Aortic atherosclerosis. 8. Mild stranding about the greater trochanter bilaterally and in the subcutaneous fat along the dependent body wall in gluteal regions. Correlate with any clinical evidence of cellulitis or developing pressure ulcers.   Aortic Atherosclerosis (ICD10-I70.0).   While there they gave her a SMOG enema, MiraLAX, Linzess.  She is here for follow-up.  She is now on Linzess 290 mcg daily, MiraLAX twice daily, and Metamucil.  Yesterday she had 2 hard stools and  then 3 liquidy/watery stools.  Past Medical History:  Diagnosis Date   Arthritis    Bronchitis    Complication of anesthesia    after gallbladder surgery with extubation had shallow respirations,  needed to continue intudation for additonal 10-12 days   Diabetes ([place])    DVT (deep venous thrombosis) (HCC)    left leg after hysterectomy   GERD (gastroesophageal reflux disease)    History of COVID-19 05/2021   History of kidney stones    History of transient ischemic attack (TIA)    HOH (hard of hearing)    Hyperlipidemia    Hypertension    Kidney stones 2023   Sleep apnea    Past Surgical History:  Procedure Laterality Date   ABDOMINAL HYSTERECTOMY     APPENDECTOMY     CATARACT EXTRACTION, BILATERAL     CHOLECYSTECTOMY     COLONOSCOPY     CYSTOSCOPY WITH STENT PLACEMENT Right 08/14/2021   Procedure: CYSTOSCOPY WITH STENT PLACEMENT;  Surgeon: Ardis Hughs, MD;  Location: [facility];  Service: Urology;  Laterality: Right;  ONLY NEEDS 30 MIN   EXTRACORPOREAL SHOCK WAVE LITHOTRIPSY Right 02/06/2022   Procedure: RIGHT EXTRACORPOREAL SHOCK WAVE LITHOTRIPSY (ESWL);  Surgeon: Ardis Hughs, MD;  Location: [facility];  Service: Urology;  Laterality: Right;    reports that she has quit smoking. She has never used smokeless tobacco. She reports that she does not currently use alcohol. She reports that she does not use drugs. family history includes CAD in her maternal grandmother; Heart disease in her maternal grandmother; Pneumonia in her maternal grandfather. Allergies  Allergen Reactions   Penicillins Hives   Bactrim [Sulfamethoxazole-Trimethoprim] Nausea And Vomiting  Coconut Oil Hives, Swelling and Rash   Other Hives, Swelling and Rash    Berries       Outpatient Encounter Medications as of 07/16/2022  Medication Sig   Accu-Chek FastClix Lancets MISC USE AS DIRECTED   amLODipine (NORVASC) 10 MG tablet Take 1 tablet (10 mg total) by mouth daily.    aspirin EC 81 MG tablet Take 81 mg by mouth in the morning.   aspirin-acetaminophen-caffeine (EXCEDRIN MIGRAINE) 250-250-65 MG tablet Take 1-2 tablets by mouth daily as needed for headache or migraine.   atorvastatin (LIPITOR) 20 MG tablet Take 1 tablet (20 mg total) by mouth at bedtime.   bisacodyl (DULCOLAX) 10 MG suppository Place 1 suppository (10 mg total) rectally as needed for moderate constipation. Available OTC.   bisoprolol (ZEBETA) 5 MG tablet TAKE 1 TABLET EVERY DAY   Blood Glucose Monitoring Suppl (TRUE METRIX METER) DEVI 1 each by Does not apply route daily.   Cholecalciferol (VITAMIN D3) 50 MCG (2000 UT) TABS Take 2,000 Units by mouth in the morning.   fluticasone (FLONASE) 50 MCG/ACT nasal spray Place 2 sprays into both nostrils daily.   furosemide (LASIX) 20 MG tablet Take 1 tablet (20 mg total) by mouth daily.   guaiFENesin (MUCINEX) 600 MG 12 hr tablet Take 1 tablet (600 mg total) by mouth 2 (two) times daily.   linaclotide (LINZESS) 290 MCG CAPS capsule Take 1 capsule (290 mcg total) by mouth daily before breakfast.   loratadine (CLARITIN) 10 MG tablet Take 1 tablet (10 mg total) by mouth daily.   mometasone-formoterol (DULERA) 200-5 MCG/ACT AERO Inhale 2 puffs into the lungs 2 (two) times daily.   Multiple Vitamin (MULTIVITAMIN WITH MINERALS) TABS tablet Take 1 tablet by mouth daily. Centrum Silver   Multiple Vitamins-Minerals (PRESERVISION AREDS 2 PO) Take 1 tablet by mouth in the morning and at bedtime.   mupirocin ointment (BACTROBAN) 2 % Apply 1 Application topically as needed (anti-fungal bacteria).   Omega-3 Fatty Acids (FISH OIL) 1000 MG CAPS Take 2,000 mg by mouth daily.   pantoprazole (PROTONIX) 40 MG tablet Take 1 tablet (40 mg total) by mouth daily.   Polyethyl Glycol-Propyl Glycol (SYSTANE ULTRA) 0.4-0.3 % SOLN Place 1-2 drops into both eyes 3 (three) times daily as needed (dry/irritated eyes.).   polyethylene glycol powder (GLYCOLAX/MIRALAX) 17 GM/SCOOP powder  Take 17 g by mouth 2 (two) times daily.   potassium chloride SA (KLOR-CON M) 20 MEQ tablet Take 1 tablet (20 mEq total) by mouth daily.   Probiotic Product (PROBIOTIC PO) Take 1 capsule by mouth daily.   psyllium (HYDROCIL/METAMUCIL) 95 % PACK Take 1 packet by mouth daily.   TRUE METRIX BLOOD GLUCOSE TEST test strip TEST BLOOD SUGAR EVERY DAY   No facility-administered encounter medications on file as of 07/16/2022.     REVIEW OF SYSTEMS  : All other systems reviewed and negative except where noted in the History of Present Illness.   PHYSICAL EXAM: BP 130/62   Pulse 64   Ht $R'5\' 4"'GI$  (1.626 m)   Wt 156 lb (70.8 kg)   SpO2 96%   BMI 26.78 kg/m  General: Well developed white female in no acute distress Head: Normocephalic and atraumatic Eyes:  Sclerae anicteric, conjunctiva pink. Ears: Normal auditory acuity Lungs: Clear throughout to auscultation; no W/R/R. Heart: Regular rate and rhythm; no M/R/G. Abdomen: Soft, non-distended.  BS present.  Non-tender. Musculoskeletal: Symmetrical with no gross deformities  Skin: No lesions on visible extremities Extremities: No edema  Neurological: Alert  oriented x 4, grossly non-focal Psychological:  Alert and cooperative. Normal mood and affect  ASSESSMENT AND PLAN: *Chronic constipation and fecal impactions: Currently on Linzess 290 mcg daily and Linzess twice daily.  Also just recently started Metamucil daily to help with bulk.  She did to have 2 hard stools and 3 very loose watery stools yesterday.  She will continue that current regimen for now.  If it ends up being too much then she can either titrate down the dose of Linzess to 145 mcg daily and/or decrease the dose of MiraLAX to daily.  She was given some samples of Linzess 145 in case she needs to change that.  They will keep [place] updated on her progress.  Also, they discussed pelvic floor physical therapy and would like to hold off on proceeding with that for now.  CC:  [name],  [place]*

## 2022-07-16 NOTE — Patient Instructions (Signed)
We have sent the following medications to your pharmacy for you to pick up at your convenience: Linzess 145 mcg daily before breakfast if need to reduce dose.   _______________________________________________________  If your blood pressure at your visit was 140/90 or greater, please contact your primary care physician to follow up on this.  _______________________________________________________  If you are age 87 or older, your body mass index should be between 23-30. Your Body mass index is 26.78 kg/m. If this is out of the aforementioned range listed, please consider follow up with your Primary Care Provider.  If you are age 85 or younger, your body mass index should be between 19-25. Your Body mass index is 26.78 kg/m. If this is out of the aformentioned range listed, please consider follow up with your Primary Care Provider.   ________________________________________________________  The [facility] providers would like to encourage you to use [facility] to communicate with providers for non-urgent requests or questions.  Due to long hold times on the telephone, sending your provider a message by [facility] may be a faster and more efficient way to get a response.  Please allow 48 business hours for a response.  Please remember that this is for non-urgent requests.  _______________________________________________________

## 2022-07-16 NOTE — Progress Notes (Signed)
Agree with assessment and plan as outlined.  She needs a very aggressive bowel regimen to prevent recurrent impaction..  Sounds like she is moving her bowels pretty well recently since the regimen was started.

## 2022-07-21 ENCOUNTER — Other Ambulatory Visit: Payer: Self-pay | Admitting: Internal Medicine

## 2022-07-21 DIAGNOSIS — E785 Hyperlipidemia, unspecified: Secondary | ICD-10-CM

## 2022-08-03 ENCOUNTER — Encounter: Payer: Self-pay | Admitting: Internal Medicine

## 2022-08-05 ENCOUNTER — Encounter: Payer: Self-pay | Admitting: Internal Medicine

## 2022-08-05 DIAGNOSIS — H04123 Dry eye syndrome of bilateral lacrimal glands: Secondary | ICD-10-CM | POA: Diagnosis not present

## 2022-08-05 DIAGNOSIS — H35033 Hypertensive retinopathy, bilateral: Secondary | ICD-10-CM | POA: Diagnosis not present

## 2022-08-05 DIAGNOSIS — H353132 Nonexudative age-related macular degeneration, bilateral, intermediate dry stage: Secondary | ICD-10-CM | POA: Diagnosis not present

## 2022-08-05 DIAGNOSIS — E113293 Type 2 diabetes mellitus with mild nonproliferative diabetic retinopathy without macular edema, bilateral: Secondary | ICD-10-CM | POA: Diagnosis not present

## 2022-08-12 ENCOUNTER — Ambulatory Visit: Payer: Medicare HMO | Admitting: Nurse Practitioner

## 2022-08-25 ENCOUNTER — Other Ambulatory Visit: Payer: Self-pay | Admitting: Internal Medicine

## 2022-09-17 ENCOUNTER — Other Ambulatory Visit: Payer: Self-pay | Admitting: Internal Medicine

## 2022-09-24 ENCOUNTER — Other Ambulatory Visit: Payer: Self-pay | Admitting: Internal Medicine

## 2022-09-24 DIAGNOSIS — I10 Essential (primary) hypertension: Secondary | ICD-10-CM

## 2022-10-03 ENCOUNTER — Other Ambulatory Visit: Payer: Self-pay | Admitting: Internal Medicine

## 2022-10-03 DIAGNOSIS — E785 Hyperlipidemia, unspecified: Secondary | ICD-10-CM

## 2022-10-14 ENCOUNTER — Other Ambulatory Visit: Payer: Self-pay | Admitting: Internal Medicine

## 2022-10-14 ENCOUNTER — Telehealth: Payer: Self-pay | Admitting: Internal Medicine

## 2022-10-14 MED ORDER — ACCU-CHEK FASTCLIX LANCETS MISC: 1.0000 | Freq: Every day | 3 refills | Status: DC

## 2022-10-14 MED ORDER — PANTOPRAZOLE SODIUM 40 MG PO TBEC: 40.0000 mg | DELAYED_RELEASE_TABLET | Freq: Every day | ORAL | 1 refills | Status: DC

## 2022-10-14 NOTE — Telephone Encounter (Signed)
Pt is calling and would like to know if she suppose to be still taking pantoprazole (PROTONIX) 40 MG tablet , loratadine (CLARITIN) 10 MG tablet if so pt needs refills  [id number] - [place], [place] - [address] AT [facility] [address] Phone: [phone]  Fax: [phone]

## 2022-10-14 NOTE — Telephone Encounter (Signed)
Left detailed message on machine for patient to return our call. 

## 2022-10-14 NOTE — Telephone Encounter (Signed)
Patient states that she does not have allergies.  Refill sent of Pantoprazole.

## 2022-10-29 ENCOUNTER — Encounter: Payer: Self-pay | Admitting: Internal Medicine

## 2022-10-29 ENCOUNTER — Ambulatory Visit (INDEPENDENT_AMBULATORY_CARE_PROVIDER_SITE_OTHER): Payer: Medicare HMO | Admitting: Internal Medicine

## 2022-10-29 VITALS — BP 130/70 | HR 57 | Temp 97.6°F | Wt 156.6 lb

## 2022-10-29 DIAGNOSIS — E782 Mixed hyperlipidemia: Secondary | ICD-10-CM | POA: Diagnosis not present

## 2022-10-29 DIAGNOSIS — I10 Essential (primary) hypertension: Secondary | ICD-10-CM

## 2022-10-29 DIAGNOSIS — E785 Hyperlipidemia, unspecified: Secondary | ICD-10-CM | POA: Diagnosis not present

## 2022-10-29 DIAGNOSIS — R350 Frequency of micturition: Secondary | ICD-10-CM | POA: Diagnosis not present

## 2022-10-29 DIAGNOSIS — N184 Chronic kidney disease, stage 4 (severe): Secondary | ICD-10-CM

## 2022-10-29 DIAGNOSIS — E1169 Type 2 diabetes mellitus with other specified complication: Secondary | ICD-10-CM | POA: Diagnosis not present

## 2022-10-29 DIAGNOSIS — K5909 Other constipation: Secondary | ICD-10-CM

## 2022-10-29 LAB — URINALYSIS, ROUTINE W REFLEX MICROSCOPIC
Bilirubin Urine: NEGATIVE
Hgb urine dipstick: NEGATIVE
Ketones, ur: NEGATIVE
Nitrite: NEGATIVE
Specific Gravity, Urine: 1.025 (ref 1.000–1.030)
Total Protein, Urine: 100 — AB
Urine Glucose: NEGATIVE
Urobilinogen, UA: 0.2 (ref 0.0–1.0)
pH: 6 (ref 5.0–8.0)

## 2022-10-29 LAB — POCT GLYCOSYLATED HEMOGLOBIN (HGB A1C): Hemoglobin A1C: 6.5 % — AB (ref 4.0–5.6)

## 2022-10-29 LAB — POC URINALSYSI DIPSTICK (AUTOMATED)
Bilirubin, UA: NEGATIVE
Blood, UA: NEGATIVE
Glucose, UA: NEGATIVE
Ketones, UA: NEGATIVE
Nitrite, UA: NEGATIVE
Protein, UA: POSITIVE — AB
Spec Grav, UA: 1.02 (ref 1.010–1.025)
Urobilinogen, UA: 0.2 E.U./dL
pH, UA: 6 (ref 5.0–8.0)

## 2022-10-29 MED ORDER — POTASSIUM CHLORIDE CRYS ER 20 MEQ PO TBCR: 20.0000 meq | EXTENDED_RELEASE_TABLET | Freq: Every day | ORAL | 1 refills | Status: DC

## 2022-10-29 NOTE — Progress Notes (Signed)
Established Patient Office Visit     CC/Reason for Visit: Follow-up chronic conditions and discuss urinary frequency  HPI: Stacey Armstrong is a 89 y.o. female who is coming in today for the above mentioned reasons. Past Medical History is significant for: Diabetes, hypertension, hyperlipidemia.  For the past week and a half has been having increased urinary frequency, no dysuria.  Constipation continues to be an issue for you.  She stopped taking MiraLAX for a while as she was on vacation in [place] and did not want to have any accidents.  She has since resumed.  Yesterday she had 3 BMs.   Past Medical/Surgical History: Past Medical History:  Diagnosis Date   Arthritis    Bronchitis    Complication of anesthesia    after gallbladder surgery with extubation had shallow respirations,  needed to continue intudation for additonal 10-12 days   Diabetes (HCC)    DVT (deep venous thrombosis) (HCC)    left leg after hysterectomy   GERD (gastroesophageal reflux disease)    History of COVID-19 05/2021   History of kidney stones    History of transient ischemic attack (TIA)    HOH (hard of hearing)    Hyperlipidemia    Hypertension    Kidney stones 2023   Sleep apnea     Past Surgical History:  Procedure Laterality Date   ABDOMINAL HYSTERECTOMY     APPENDECTOMY     CATARACT EXTRACTION, BILATERAL     CHOLECYSTECTOMY     COLONOSCOPY     CYSTOSCOPY WITH STENT PLACEMENT Right 08/14/2021   Procedure: CYSTOSCOPY WITH STENT PLACEMENT;  Surgeon: Crist Fat, MD;  Location: [facility];  Service: Urology;  Laterality: Right;  ONLY NEEDS 30 MIN   EXTRACORPOREAL SHOCK WAVE LITHOTRIPSY Right 02/06/2022   Procedure: RIGHT EXTRACORPOREAL SHOCK WAVE LITHOTRIPSY (ESWL);  Surgeon: Crist Fat, MD;  Location: [facility];  Service: Urology;  Laterality: Right;    Social History:  reports that she has quit smoking. She has never used smokeless tobacco. She reports that  she does not currently use alcohol. She reports that she does not use drugs.  Allergies: Allergies  Allergen Reactions   Penicillins Hives   Bactrim [Sulfamethoxazole-Trimethoprim] Nausea And Vomiting   Coconut Oil Hives, Swelling and Rash   Other Hives, Swelling and Rash    Berries     Family History:  Family History  Problem Relation Age of Onset   CAD Maternal Grandmother    Heart disease Maternal Grandmother    Pneumonia Maternal Grandfather    Liver disease Neg Hx    Colon cancer Neg Hx    Esophageal cancer Neg Hx      Current Outpatient Medications:    Accu-Chek FastClix Lancets MISC, 1 each by Other route daily., Disp: 102 each, Rfl: 3   amLODipine (NORVASC) 10 MG tablet, TAKE 1 TABLET EVERY DAY, Disp: 90 tablet, Rfl: 0   aspirin EC 81 MG tablet, Take 81 mg by mouth in the morning., Disp: , Rfl:    aspirin-acetaminophen-caffeine (EXCEDRIN MIGRAINE) 250-250-65 MG tablet, Take 1-2 tablets by mouth daily as needed for headache or migraine., Disp: , Rfl:    atorvastatin (LIPITOR) 20 MG tablet, TAKE 1 TABLET EVERY DAY, Disp: 90 tablet, Rfl: 0   bisoprolol (ZEBETA) 5 MG tablet, TAKE 1 TABLET EVERY DAY, Disp: 90 tablet, Rfl: 1   Blood Glucose Monitoring Suppl (TRUE METRIX METER) DEVI, 1 each by Does not apply route daily., Disp: 1  each, Rfl: 1   Cholecalciferol (VITAMIN D3) 50 MCG (2000 UT) TABS, Take 2,000 Units by mouth in the morning., Disp: , Rfl:    fluticasone (FLONASE) 50 MCG/ACT nasal spray, Place 2 sprays into both nostrils daily., Disp: 16 g, Rfl: 2   furosemide (LASIX) 20 MG tablet, Take 1 tablet (20 mg total) by mouth daily., Disp: 90 tablet, Rfl: 1   linaclotide (LINZESS) 290 MCG CAPS capsule, Take 1 capsule (290 mcg total) by mouth daily before breakfast., Disp: 90 capsule, Rfl: 1   Multiple Vitamins-Minerals (PRESERVISION AREDS 2 PO), Take 1 tablet by mouth in the morning and at bedtime., Disp: , Rfl:    Omega-3 Fatty Acids (FISH OIL) 1000 MG CAPS, Take 2,000 mg by  mouth daily., Disp: , Rfl:    pantoprazole (PROTONIX) 40 MG tablet, Take 1 tablet (40 mg total) by mouth daily., Disp: 90 tablet, Rfl: 1   Polyethyl Glycol-Propyl Glycol (SYSTANE ULTRA) 0.4-0.3 % SOLN, Place 1-2 drops into both eyes 3 (three) times daily as needed (dry/irritated eyes.)., Disp: , Rfl:    polyethylene glycol powder (GLYCOLAX/MIRALAX) 17 GM/SCOOP powder, Take 17 g by mouth 2 (two) times daily., Disp: 3350 g, Rfl: 1   Probiotic Product (PROBIOTIC PO), Take 1 capsule by mouth daily., Disp: , Rfl:    psyllium (HYDROCIL/METAMUCIL) 95 % PACK, Take 1 packet by mouth daily., Disp: 240 each, Rfl: 3   TRUE METRIX BLOOD GLUCOSE TEST test strip, TEST BLOOD SUGAR EVERY DAY, Disp: 100 strip, Rfl: 10   potassium chloride SA (KLOR-CON M20) 20 MEQ tablet, Take 1 tablet (20 mEq total) by mouth daily., Disp: 90 tablet, Rfl: 1  Review of Systems:  Negative unless indicated in HPI.   Physical Exam: Vitals:   10/29/22 1127  BP: 130/70  Pulse: (!) 57  Temp: 97.6 F (36.4 C)  TempSrc: Oral  SpO2: 97%  Weight: 156 lb 9.6 oz (71 kg)    Body mass index is 26.88 kg/m.   Physical Exam Vitals reviewed.  Constitutional:      Appearance: Normal appearance.  HENT:     Head: Normocephalic and atraumatic.  Eyes:     Conjunctiva/sclera: Conjunctivae normal.     Pupils: Pupils are equal, round, and reactive to light.  Cardiovascular:     Rate and Rhythm: Normal rate and regular rhythm.  Pulmonary:     Effort: Pulmonary effort is normal.     Breath sounds: Normal breath sounds.  Skin:    General: Skin is warm and dry.  Neurological:     General: No focal deficit present.     Mental Status: She is alert and oriented to person, place, and time.  Psychiatric:        Mood and Affect: Mood normal.        Behavior: Behavior normal.        Thought Content: Thought content normal.        Judgment: Judgment normal.      Impression and Plan:  Type 2 diabetes mellitus with hyperlipidemia  (HCC) Assessment & Plan: A1c is stable at 6.5. She is off all DM medications.  Orders: -     POCT glycosylated hemoglobin (Hb A1C)  Essential hypertension Assessment & Plan: BP is well controlled on current regimen.   Mixed hyperlipidemia Assessment & Plan: Last LDL was well within goal at 48. Continue atorvastatin.   CKD (chronic kidney disease) stage 4, GFR 15-29 ml/min (HCC) Assessment & Plan: Baseline Cr is around 1.4-1.5.   Chronic  constipation Assessment & Plan: With multiple admissions for fecal impactions. Needs aggressive bowel regimen. Continues linzess, miralax and fiber supplements. Enemas only when needed and none recently.   Urine frequency -     POCT Urinalysis Dipstick (Automated) -     Urinalysis; Future -     Urine Culture; Future  Other orders -     Potassium Chloride Crys ER; Take 1 tablet (20 mEq total) by mouth daily.  Dispense: 90 tablet; Refill: 1  -Urine dipstick with trace leukocytes, but nothing else. Send for cx. Only treat with abx if positive.   Time spent:32 minutes reviewing chart, interviewing and examining patient and formulating plan of care.     Chaya Jan, MD [facility]

## 2022-10-29 NOTE — Assessment & Plan Note (Signed)
BP is well-controlled on current regimen.  

## 2022-10-29 NOTE — Assessment & Plan Note (Signed)
Baseline Cr is around 1.4-1.5.

## 2022-10-29 NOTE — Assessment & Plan Note (Signed)
A1c is stable at 6.5. She is off all DM medications.

## 2022-10-29 NOTE — Assessment & Plan Note (Signed)
Last LDL was well within goal at 48. Continue atorvastatin.

## 2022-10-29 NOTE — Assessment & Plan Note (Signed)
With multiple admissions for fecal impactions. Needs aggressive bowel regimen. Continues linzess, miralax and fiber supplements. Enemas only when needed and none recently.

## 2022-10-30 LAB — URINE CULTURE
MICRO NUMBER:: 15044638
SPECIMEN QUALITY:: ADEQUATE

## 2022-11-29 ENCOUNTER — Other Ambulatory Visit: Payer: Self-pay | Admitting: Internal Medicine

## 2022-12-10 ENCOUNTER — Telehealth: Payer: Self-pay | Admitting: Internal Medicine

## 2022-12-10 MED ORDER — LORATADINE 10 MG PO TABS: 10.0000 mg | ORAL_TABLET | Freq: Every day | ORAL | 1 refills | Status: DC

## 2022-12-10 NOTE — Telephone Encounter (Signed)
 Rx sent 

## 2022-12-10 NOTE — Telephone Encounter (Signed)
Not on current medication list. Okay to fill?

## 2022-12-10 NOTE — Telephone Encounter (Addendum)
Prescription Request  12/10/2022  LOV: 10/29/2022  What is the name of the medication or equipment? Loratadine 10 mg  Have you contacted your pharmacy to request a refill? No   Which pharmacy would you like this sent to?   [id number] - [place], [place] - [address] AT CORNER OF [facility] [address] [place] [address] Phone: [phone] Fax: [phone]    Patient notified that their request is being sent to the clinical staff for review and that they should receive a response within 2 business days.   Please advise at [facility] [phone]

## 2022-12-18 ENCOUNTER — Other Ambulatory Visit: Payer: Self-pay | Admitting: Internal Medicine

## 2022-12-18 DIAGNOSIS — E1169 Type 2 diabetes mellitus with other specified complication: Secondary | ICD-10-CM

## 2023-01-07 DIAGNOSIS — R0789 Other chest pain: Secondary | ICD-10-CM | POA: Diagnosis not present

## 2023-01-07 DIAGNOSIS — R110 Nausea: Secondary | ICD-10-CM | POA: Diagnosis not present

## 2023-01-07 DIAGNOSIS — N200 Calculus of kidney: Secondary | ICD-10-CM | POA: Diagnosis not present

## 2023-01-07 DIAGNOSIS — I10 Essential (primary) hypertension: Secondary | ICD-10-CM | POA: Diagnosis not present

## 2023-01-07 DIAGNOSIS — R42 Dizziness and giddiness: Secondary | ICD-10-CM | POA: Diagnosis not present

## 2023-01-07 DIAGNOSIS — N3941 Urge incontinence: Secondary | ICD-10-CM | POA: Diagnosis not present

## 2023-01-07 DIAGNOSIS — R350 Frequency of micturition: Secondary | ICD-10-CM | POA: Diagnosis not present

## 2023-01-07 DIAGNOSIS — R8271 Bacteriuria: Secondary | ICD-10-CM | POA: Diagnosis not present

## 2023-01-07 DIAGNOSIS — I959 Hypotension, unspecified: Secondary | ICD-10-CM | POA: Diagnosis not present

## 2023-01-08 ENCOUNTER — Inpatient Hospital Stay (HOSPITAL_COMMUNITY)
Admission: EM | Admit: 2023-01-08 | Discharge: 2023-01-10 | DRG: 442 | Disposition: A | Discharge status: Home / Self Care | Payer: Medicare HMO | Attending: Family Medicine | Admitting: Family Medicine

## 2023-01-08 ENCOUNTER — Encounter (HOSPITAL_COMMUNITY): Payer: Self-pay

## 2023-01-08 ENCOUNTER — Emergency Department (HOSPITAL_COMMUNITY): Payer: Medicare HMO

## 2023-01-08 ENCOUNTER — Inpatient Hospital Stay (HOSPITAL_COMMUNITY): Payer: Medicare HMO

## 2023-01-08 DIAGNOSIS — E785 Hyperlipidemia, unspecified: Secondary | ICD-10-CM | POA: Diagnosis present

## 2023-01-08 DIAGNOSIS — T378X5A Adverse effect of other specified systemic anti-infectives and antiparasitics, initial encounter: Secondary | ICD-10-CM | POA: Diagnosis present

## 2023-01-08 DIAGNOSIS — Z8673 Personal history of transient ischemic attack (TIA), and cerebral infarction without residual deficits: Secondary | ICD-10-CM

## 2023-01-08 DIAGNOSIS — I700 Atherosclerosis of aorta: Secondary | ICD-10-CM | POA: Diagnosis not present

## 2023-01-08 DIAGNOSIS — K219 Gastro-esophageal reflux disease without esophagitis: Secondary | ICD-10-CM | POA: Diagnosis present

## 2023-01-08 DIAGNOSIS — J9811 Atelectasis: Secondary | ICD-10-CM | POA: Diagnosis present

## 2023-01-08 DIAGNOSIS — Z881 Allergy status to other antibiotic agents status: Secondary | ICD-10-CM

## 2023-01-08 DIAGNOSIS — R0600 Dyspnea, unspecified: Secondary | ICD-10-CM

## 2023-01-08 DIAGNOSIS — Z87440 Personal history of urinary (tract) infections: Secondary | ICD-10-CM

## 2023-01-08 DIAGNOSIS — Z91048 Other nonmedicinal substance allergy status: Secondary | ICD-10-CM

## 2023-01-08 DIAGNOSIS — R109 Unspecified abdominal pain: Secondary | ICD-10-CM | POA: Diagnosis not present

## 2023-01-08 DIAGNOSIS — Z86718 Personal history of other venous thrombosis and embolism: Secondary | ICD-10-CM

## 2023-01-08 DIAGNOSIS — I129 Hypertensive chronic kidney disease with stage 1 through stage 4 chronic kidney disease, or unspecified chronic kidney disease: Secondary | ICD-10-CM | POA: Diagnosis present

## 2023-01-08 DIAGNOSIS — R918 Other nonspecific abnormal finding of lung field: Secondary | ICD-10-CM | POA: Diagnosis not present

## 2023-01-08 DIAGNOSIS — R079 Chest pain, unspecified: Secondary | ICD-10-CM | POA: Diagnosis not present

## 2023-01-08 DIAGNOSIS — R1110 Vomiting, unspecified: Secondary | ICD-10-CM | POA: Diagnosis not present

## 2023-01-08 DIAGNOSIS — Z1152 Encounter for screening for COVID-19: Secondary | ICD-10-CM

## 2023-01-08 DIAGNOSIS — B179 Acute viral hepatitis, unspecified: Principal | ICD-10-CM

## 2023-01-08 DIAGNOSIS — Z8249 Family history of ischemic heart disease and other diseases of the circulatory system: Secondary | ICD-10-CM | POA: Diagnosis not present

## 2023-01-08 DIAGNOSIS — Z91018 Allergy to other foods: Secondary | ICD-10-CM

## 2023-01-08 DIAGNOSIS — Z7982 Long term (current) use of aspirin: Secondary | ICD-10-CM

## 2023-01-08 DIAGNOSIS — J90 Pleural effusion, not elsewhere classified: Secondary | ICD-10-CM | POA: Diagnosis not present

## 2023-01-08 DIAGNOSIS — K718 Toxic liver disease with other disorders of liver: Principal | ICD-10-CM | POA: Diagnosis present

## 2023-01-08 DIAGNOSIS — Z882 Allergy status to sulfonamides status: Secondary | ICD-10-CM

## 2023-01-08 DIAGNOSIS — E1169 Type 2 diabetes mellitus with other specified complication: Secondary | ICD-10-CM

## 2023-01-08 DIAGNOSIS — E872 Acidosis, unspecified: Secondary | ICD-10-CM | POA: Diagnosis present

## 2023-01-08 DIAGNOSIS — R0789 Other chest pain: Secondary | ICD-10-CM | POA: Diagnosis not present

## 2023-01-08 DIAGNOSIS — N1831 Chronic kidney disease, stage 3a: Secondary | ICD-10-CM | POA: Diagnosis present

## 2023-01-08 DIAGNOSIS — G4733 Obstructive sleep apnea (adult) (pediatric): Secondary | ICD-10-CM | POA: Diagnosis present

## 2023-01-08 DIAGNOSIS — Z87891 Personal history of nicotine dependence: Secondary | ICD-10-CM

## 2023-01-08 DIAGNOSIS — E1122 Type 2 diabetes mellitus with diabetic chronic kidney disease: Secondary | ICD-10-CM | POA: Diagnosis present

## 2023-01-08 DIAGNOSIS — K7689 Other specified diseases of liver: Secondary | ICD-10-CM | POA: Diagnosis not present

## 2023-01-08 DIAGNOSIS — R7989 Other specified abnormal findings of blood chemistry: Secondary | ICD-10-CM

## 2023-01-08 DIAGNOSIS — Z9049 Acquired absence of other specified parts of digestive tract: Secondary | ICD-10-CM | POA: Diagnosis not present

## 2023-01-08 DIAGNOSIS — R911 Solitary pulmonary nodule: Secondary | ICD-10-CM | POA: Diagnosis not present

## 2023-01-08 DIAGNOSIS — K760 Fatty (change of) liver, not elsewhere classified: Secondary | ICD-10-CM | POA: Diagnosis present

## 2023-01-08 DIAGNOSIS — K2970 Gastritis, unspecified, without bleeding: Secondary | ICD-10-CM | POA: Diagnosis present

## 2023-01-08 DIAGNOSIS — Z8616 Personal history of COVID-19: Secondary | ICD-10-CM | POA: Diagnosis not present

## 2023-01-08 DIAGNOSIS — Z79899 Other long term (current) drug therapy: Secondary | ICD-10-CM | POA: Diagnosis not present

## 2023-01-08 DIAGNOSIS — J439 Emphysema, unspecified: Secondary | ICD-10-CM | POA: Diagnosis not present

## 2023-01-08 DIAGNOSIS — Z888 Allergy status to other drugs, medicaments and biological substances status: Secondary | ICD-10-CM

## 2023-01-08 DIAGNOSIS — K719 Toxic liver disease, unspecified: Secondary | ICD-10-CM

## 2023-01-08 LAB — ACETAMINOPHEN LEVEL: Acetaminophen (Tylenol), Serum: <10 ug/mL — ABNORMAL LOW (ref 10–30)

## 2023-01-08 LAB — RAPID URINE DRUG SCREEN, HOSP PERFORMED
Amphetamines: NOT DETECTED
Barbiturates: NOT DETECTED
Benzodiazepines: NOT DETECTED
Cocaine: NOT DETECTED
Opiates: NOT DETECTED
Tetrahydrocannabinol: NOT DETECTED

## 2023-01-08 LAB — TROPONIN I (HIGH SENSITIVITY)
Troponin I (High Sensitivity): 10 ng/L (ref <18)
Troponin I (High Sensitivity): 15 ng/L (ref <18)

## 2023-01-08 LAB — URINALYSIS, W/ REFLEX TO CULTURE (INFECTION SUSPECTED)
Bilirubin Urine: NEGATIVE
Glucose, UA: NEGATIVE mg/dL
Hgb urine dipstick: NEGATIVE
Ketones, ur: 5 mg/dL — AB
Leukocytes,Ua: NEGATIVE
Nitrite: NEGATIVE
Protein, ur: >=300 mg/dL — AB
Specific Gravity, Urine: 1.014 (ref 1.005–1.030)
pH: 6 (ref 5.0–8.0)

## 2023-01-08 LAB — RESPIRATORY PANEL BY PCR

## 2023-01-08 LAB — HEPATIC FUNCTION PANEL
ALT: 2397 U/L — ABNORMAL HIGH (ref 0–44)
AST: 5812 U/L — ABNORMAL HIGH (ref 15–41)
Albumin: 2.9 g/dL — ABNORMAL LOW (ref 3.5–5.0)
Alkaline Phosphatase: 210 U/L — ABNORMAL HIGH (ref 38–126)
Bilirubin, Direct: 0.5 mg/dL — ABNORMAL HIGH (ref 0.0–0.2)
Indirect Bilirubin: 1 mg/dL — ABNORMAL HIGH (ref 0.3–0.9)
Total Bilirubin: 1.5 mg/dL — ABNORMAL HIGH (ref 0.3–1.2)
Total Protein: 5.7 g/dL — ABNORMAL LOW (ref 6.5–8.1)

## 2023-01-08 LAB — BLOOD GAS, ARTERIAL
Acid-Base Excess: 1 mmol/L (ref 0.0–2.0)
Bicarbonate: 26 mmol/L (ref 20.0–28.0)
Drawn by: 164
O2 Saturation: 97.8 %
Patient temperature: 36.7
pCO2 arterial: 41 mmHg (ref 32–48)
pH, Arterial: 7.4 (ref 7.35–7.45)
pO2, Arterial: 81 mmHg — ABNORMAL LOW (ref 83–108)

## 2023-01-08 LAB — SEDIMENTATION RATE: Sed Rate: 33 mm/hr — ABNORMAL HIGH (ref 0–22)

## 2023-01-08 LAB — HEPATITIS PANEL, ACUTE
HCV Ab: NONREACTIVE
Hep A IgM: NONREACTIVE
Hep B C IgM: NONREACTIVE
Hepatitis B Surface Ag: NONREACTIVE

## 2023-01-08 LAB — BRAIN NATRIURETIC PEPTIDE: B Natriuretic Peptide: 372.5 pg/mL — ABNORMAL HIGH (ref 0.0–100.0)

## 2023-01-08 LAB — CBC WITH DIFFERENTIAL/PLATELET
Abs Immature Granulocytes: 0.02 10*3/uL (ref 0.00–0.07)
Basophils Absolute: 0 10*3/uL (ref 0.0–0.1)
Basophils Relative: 0 %
Eosinophils Absolute: 0 10*3/uL (ref 0.0–0.5)
Eosinophils Relative: 0 %
HCT: 38.5 % (ref 36.0–46.0)
Hemoglobin: 12 g/dL (ref 12.0–15.0)
Immature Granulocytes: 0 %
Lymphocytes Relative: 3 %
Lymphs Abs: 0.2 10*3/uL — ABNORMAL LOW (ref 0.7–4.0)
MCH: 26.1 pg (ref 26.0–34.0)
MCHC: 31.2 g/dL (ref 30.0–36.0)
MCV: 83.7 fL (ref 80.0–100.0)
Monocytes Absolute: 0.7 10*3/uL (ref 0.1–1.0)
Monocytes Relative: 10 %
Neutro Abs: 6.1 10*3/uL (ref 1.7–7.7)
Neutrophils Relative %: 87 %
Platelets: 255 10*3/uL (ref 150–400)
RBC: 4.6 MIL/uL (ref 3.87–5.11)
RDW: 16.3 % — ABNORMAL HIGH (ref 11.5–15.5)
WBC: 7.1 10*3/uL (ref 4.0–10.5)
nRBC: 0 % (ref 0.0–0.2)

## 2023-01-08 LAB — COMPREHENSIVE METABOLIC PANEL
ALT: 1495 U/L — ABNORMAL HIGH (ref 0–44)
AST: 3779 U/L — ABNORMAL HIGH (ref 15–41)
Albumin: 3.3 g/dL — ABNORMAL LOW (ref 3.5–5.0)
Alkaline Phosphatase: 208 U/L — ABNORMAL HIGH (ref 38–126)
Anion gap: 16 — ABNORMAL HIGH (ref 5–15)
BUN: 27 mg/dL — ABNORMAL HIGH (ref 8–23)
CO2: 22 mmol/L (ref 22–32)
Calcium: 9.4 mg/dL (ref 8.9–10.3)
Chloride: 98 mmol/L (ref 98–111)
Creatinine, Ser: 1.56 mg/dL — ABNORMAL HIGH (ref 0.44–1.00)
GFR, Estimated: 31 mL/min — ABNORMAL LOW (ref >60)
Glucose, Bld: 226 mg/dL — ABNORMAL HIGH (ref 70–99)
Potassium: 4 mmol/L (ref 3.5–5.1)
Sodium: 136 mmol/L (ref 135–145)
Total Bilirubin: 1.1 mg/dL (ref 0.3–1.2)
Total Protein: 6.4 g/dL — ABNORMAL LOW (ref 6.5–8.1)

## 2023-01-08 LAB — PROTIME-INR
INR: 1.1 (ref 0.8–1.2)
INR: 1 (ref 0.8–1.2)
Prothrombin Time: 14.1 seconds (ref 11.4–15.2)
Prothrombin Time: 13.2 seconds (ref 11.4–15.2)

## 2023-01-08 LAB — CK: Total CK: 18 U/L — ABNORMAL LOW (ref 38–234)

## 2023-01-08 LAB — SARS CORONAVIRUS 2 BY RT PCR: SARS Coronavirus 2 by RT PCR: NEGATIVE

## 2023-01-08 LAB — LACTIC ACID, PLASMA
Lactic Acid, Venous: 2.6 mmol/L (ref 0.5–1.9)
Lactic Acid, Venous: 2.3 mmol/L (ref 0.5–1.9)
Lactic Acid, Venous: 1.7 mmol/L (ref 0.5–1.9)

## 2023-01-08 LAB — ETHANOL: Alcohol, Ethyl (B): <10 mg/dL (ref <10)

## 2023-01-08 LAB — LIPASE, BLOOD: Lipase: 35 U/L (ref 11–51)

## 2023-01-08 LAB — CBG MONITORING, ED: Glucose-Capillary: 204 mg/dL — ABNORMAL HIGH (ref 70–99)

## 2023-01-08 MED ORDER — LACTATED RINGERS IV BOLUS
1000.0000 mL | Freq: Once | INTRAVENOUS | Status: AC
  Administered 2023-01-08: 1000 mL via INTRAVENOUS

## 2023-01-08 MED ORDER — SODIUM CHLORIDE 0.9 % IV SOLN: INTRAVENOUS | Status: DC

## 2023-01-08 MED ORDER — ALBUTEROL SULFATE (2.5 MG/3ML) 0.083% IN NEBU: 2.5000 mg | INHALATION_SOLUTION | RESPIRATORY_TRACT | Status: DC | PRN

## 2023-01-08 MED ORDER — PANTOPRAZOLE SODIUM 40 MG IV SOLR
40.0000 mg | Freq: Once | INTRAVENOUS | Status: AC
  Administered 2023-01-08: 40 mg via INTRAVENOUS
  Filled 2023-01-08: qty 10

## 2023-01-08 MED ORDER — PANTOPRAZOLE SODIUM 40 MG IV SOLR
40.0000 mg | INTRAVENOUS | Status: DC
  Administered 2023-01-08 – 2023-01-09 (×2): 40 mg via INTRAVENOUS
  Filled 2023-01-08 (×2): qty 10

## 2023-01-08 MED ORDER — ONDANSETRON HCL 4 MG/2ML IJ SOLN
4.0000 mg | Freq: Once | INTRAMUSCULAR | Status: AC
  Administered 2023-01-08: 4 mg via INTRAVENOUS
  Filled 2023-01-08: qty 2

## 2023-01-08 MED ORDER — LIDOCAINE VISCOUS HCL 2 % MT SOLN
15.0000 mL | Freq: Once | OROMUCOSAL | Status: AC
  Administered 2023-01-08: 15 mL via ORAL
  Filled 2023-01-08: qty 15

## 2023-01-08 MED ORDER — ALUM & MAG HYDROXIDE-SIMETH 200-200-20 MG/5ML PO SUSP
30.0000 mL | Freq: Once | ORAL | Status: AC
  Administered 2023-01-08: 30 mL via ORAL
  Filled 2023-01-08: qty 30

## 2023-01-08 MED ORDER — ONDANSETRON HCL 4 MG/2ML IJ SOLN: 4.0000 mg | Freq: Four times a day (QID) | INTRAMUSCULAR | Status: DC | PRN

## 2023-01-08 MED ORDER — ONDANSETRON HCL 4 MG PO TABS: 4.0000 mg | ORAL_TABLET | Freq: Four times a day (QID) | ORAL | Status: DC | PRN

## 2023-01-08 NOTE — Consult Note (Signed)
Consultation  Referring Provider:[facility]/ Maisie Fus Primary Care Physician:  Philip Aspen, Limmie Patricia, MD Primary Gastroenterologist:  Dr.Armbruster  Reason for Consultation:  elevated LFT's  HPI: Stacey Armstrong is a 89 y.o. female, known to Dr. Adela Lank and previously followed for chronic constipation.  She has history of prior TIA, adult onset diabetes mellitus, prior history of DVT, hyperlipidemia, GERD, hypertension, status post appendectomy, cholecystectomy and prior lithotripsy. He presented to the emergency room last evening with complaints of lower chest upper abdominal pain nausea and vomiting. Interestingly she had just been started on nitrofurantoin per her PCP had taken 2 doses of it yesterday for urinary tract infection.  She says a couple of hours later she started feeling bad with upper abdominal/lower chest pain. Workup in the emergency room with labs show WBC of 7.1/hemoglobin 12/macro 38.5/platelets 256 Sodium 136/potassium 4.0 BUN 27/creatinine 1.56 T. bili 1.1/alk phos 208/AST 3779/ALT 1495. Troponin within normal limits. UA positive for proteinuria, leukocytes negative Acute hepatitis panel negative   She underwent upper abdominal ultrasound shows her to be status postcholecystectomy with CBD of 7.5 mm and increased hepatic echogenicity and some mild nodularity.  Labs today show T. bili 1.5/AST 5812/ALT 2397/alk phos 210 INR 1.1 Lactate 2.6>2.3 EtOH negative/acetaminophen less than 10  Patient says she is never had any liver issues that she is aware of.  She is feeling better today with resolution of the upper abdominal pain, she says there is still some mild tenderness there, she is no longer nauseated and feels that she could eat something. Her recent pneumatic patient is or changes in medications other than the nitrofurantoin.   Past Medical History:  Diagnosis Date   Arthritis    Bronchitis    Complication of anesthesia    after gallbladder surgery with  extubation had shallow respirations,  needed to continue intudation for additonal 10-12 days   Diabetes (HCC)    DVT (deep venous thrombosis) (HCC)    left leg after hysterectomy   GERD (gastroesophageal reflux disease)    History of COVID-19 05/2021   History of kidney stones    History of transient ischemic attack (TIA)    HOH (hard of hearing)    Hyperlipidemia    Hypertension    Kidney stones 2023   Sleep apnea     Past Surgical History:  Procedure Laterality Date   ABDOMINAL HYSTERECTOMY     APPENDECTOMY     CATARACT EXTRACTION, BILATERAL     CHOLECYSTECTOMY     COLONOSCOPY     CYSTOSCOPY WITH STENT PLACEMENT Right 08/14/2021   Procedure: CYSTOSCOPY WITH STENT PLACEMENT;  Surgeon: Crist Fat, MD;  Location: [facility];  Service: Urology;  Laterality: Right;  ONLY NEEDS 30 MIN   EXTRACORPOREAL SHOCK WAVE LITHOTRIPSY Right 02/06/2022   Procedure: RIGHT EXTRACORPOREAL SHOCK WAVE LITHOTRIPSY (ESWL);  Surgeon: Crist Fat, MD;  Location: [facility];  Service: Urology;  Laterality: Right;    Prior to Admission medications   Medication Sig Start Date End Date Taking? Authorizing Provider  Accu-Chek FastClix Lancets MISC 1 each by Other route daily. 10/14/22   Philip Aspen, Limmie Patricia, MD  amLODipine (NORVASC) 10 MG tablet TAKE 1 TABLET EVERY DAY 12/01/22   Philip Aspen, Limmie Patricia, MD  aspirin EC 81 MG tablet Take 81 mg by mouth in the morning.    [provider]  aspirin-acetaminophen-caffeine (EXCEDRIN MIGRAINE) [phone] MG tablet Take 1-2 tablets by mouth daily as needed for headache or migraine.  [provider]  atorvastatin (LIPITOR) 20 MG tablet TAKE 1 TABLET EVERY DAY 12/18/22   Philip Aspen, Limmie Patricia, MD  bisoprolol (ZEBETA) 5 MG tablet TAKE 1 TABLET EVERY DAY 09/24/22   Philip Aspen, Limmie Patricia, MD  Blood Glucose Monitoring Suppl (TRUE METRIX METER) DEVI 1 each by Does not apply route daily. 09/28/20   Philip Aspen,  Limmie Patricia, MD  Cholecalciferol (VITAMIN D3) 50 MCG (2000 UT) TABS Take 2,000 Units by mouth in the morning.    [provider]  fluticasone (FLONASE) 50 MCG/ACT nasal spray Place 2 sprays into both nostrils daily. 07/04/22   Rai, Delene Ruffini, MD  furosemide (LASIX) 20 MG tablet Take 1 tablet (20 mg total) by mouth daily. 07/14/22   Philip Aspen, Limmie Patricia, MD  linaclotide [facility]) 290 MCG CAPS capsule Take 1 capsule (290 mcg total) by mouth daily before breakfast. 07/14/22   Philip Aspen, Limmie Patricia, MD  loratadine (CLARITIN) 10 MG tablet Take 1 tablet (10 mg total) by mouth daily. 12/10/22   Philip Aspen, Limmie Patricia, MD  Multiple Vitamins-Minerals (PRESERVISION AREDS 2 PO) Take 1 tablet by mouth in the morning and at bedtime.    [provider]  Omega-3 Fatty Acids (FISH OIL) 1000 MG CAPS Take 2,000 mg by mouth daily.    [provider]  pantoprazole (PROTONIX) 40 MG tablet Take 1 tablet (40 mg total) by mouth daily. 10/14/22   Philip Aspen, Limmie Patricia, MD  Polyethyl Glycol-Propyl Glycol (SYSTANE ULTRA) 0.4-0.3 % SOLN Place 1-2 drops into both eyes 3 (three) times daily as needed (dry/irritated eyes.).    [provider]  polyethylene glycol powder (GLYCOLAX/MIRALAX) 17 GM/SCOOP powder Take 17 g by mouth 2 (two) times daily. 07/03/22   Rai, Ripudeep K, MD  potassium chloride SA (KLOR-CON M20) 20 MEQ tablet Take 1 tablet (20 mEq total) by mouth daily. 10/29/22   Philip Aspen, Limmie Patricia, MD  Probiotic Product (PROBIOTIC PO) Take 1 capsule by mouth daily.    [provider]  psyllium (HYDROCIL/METAMUCIL) 95 % PACK Take 1 packet by mouth daily. 07/04/22   Rai, Delene Ruffini, MD  TRUE METRIX BLOOD GLUCOSE TEST test strip TEST BLOOD SUGAR EVERY DAY 03/10/22   Philip Aspen, Limmie Patricia, MD    No current facility-administered medications for this encounter.    Allergies as of 01/08/2023 - Review Complete 01/08/2023  Allergen Reaction Noted   Penicillins Hives  12/07/2018   Bactrim [sulfamethoxazole-trimethoprim] Nausea And Vomiting 08/22/2021   Coconut oil Hives, Swelling, and Rash 03/08/2022   Other Hives, Swelling, and Rash 07/08/2019    Family History  Problem Relation Age of Onset   CAD Maternal Grandmother    Heart disease Maternal Grandmother    Pneumonia Maternal Grandfather    Liver disease Neg Hx    Colon cancer Neg Hx    Esophageal cancer Neg Hx     Social History   Socioeconomic History   Marital status: Widowed    Spouse name: Not on file   Number of children: 4   Years of education: Not on file   Highest education level: Not on file  Occupational History   Occupation: retred  Tobacco Use   Smoking status: Former   Smokeless tobacco: Never  Vaping Use   Vaping status: Never Used  Substance and Sexual Activity   Alcohol use: Not Currently   Drug use: Never   Sexual activity: Not Currently    Birth control/protection: Surgical  Other Topics Concern  Not on file  Social History Narrative   Not on file   Social Determinants of Health   Financial Resource Strain: Not on file  Food Insecurity: No Food Insecurity (01/08/2023)   Hunger Vital Sign    Worried About Running Out of Food in the Last Year: Never true    Ran Out of Food in the Last Year: Never true  Transportation Needs: No Transportation Needs (01/08/2023)   PRAPARE - Administrator, Civil Service (Medical): No    Lack of Transportation (Non-Medical): No  Physical Activity: Not on file  Stress: Not on file  Social Connections: Not on file  Intimate Partner Violence: Not At Risk (01/08/2023)   Humiliation, Afraid, Rape, and Kick questionnaire    Fear of Current or Ex-Partner: No    Emotionally Abused: No    Physically Abused: No    Sexually Abused: No    Review of Systems: Pertinent positive and negative review of systems were noted in the above HPI section.  All other review of systems was otherwise negative.   Physical Exam: Vital  signs in last 24 hours: Temp:  [97.6 F (36.4 C)-98.4 F (36.9 C)] 97.6 F (36.4 C) (08/15 0931) Pulse Rate:  [56-71] 58 (08/15 0931) Resp:  [12-22] 18 (08/15 0931) BP: (126-183)/(39-129) 163/49 (08/15 0931) SpO2:  [94 %-100 %] 98 % (08/15 0931)   General:   Alert,  Well-developed, well-nourished, elderly white female, sitting up in the chair, pleasant and cooperative in NAD Head:  Normocephalic and atraumatic. Eyes:  Sclera clear, no icterus.   Conjunctiva pink. Ears:  Normal auditory acuity. Nose:  No deformity, discharge,  or lesions. Mouth:  No deformity or lesions.   Neck:  Supple; no masses or thyromegaly. Lungs:  Clear throughout to auscultation.   No wheezes, crackles, or rhonchi.  Heart:  Regular rate and rhythm; no murmurs, clicks, rubs,  or gallops. Abdomen:  Soft, no focal tenderness, BS active,nonpalp mass or hsm.   Rectal: Not done Msk:  Symmetrical without gross deformities. . Pulses:  Normal pulses noted. Extremities:  Without clubbing or edema. Neurologic:  Alert and  oriented x4;  grossly normal neurologically. Skin:  Intact without significant lesions or rashes.. Psych:  Alert and cooperative. Normal mood and affect.  Intake/Output from previous day: No intake/output data recorded. Intake/Output this shift: Total I/O In: 120 [P.O.:120] Out: -   Lab Results: Recent Labs    01/08/23 0109  WBC 7.1  HGB 12.0  HCT 38.5  PLT 255   BMET Recent Labs    01/08/23 0109  NA 136  K 4.0  CL 98  CO2 22  GLUCOSE 226*  BUN 27*  CREATININE 1.56*  CALCIUM 9.4   LFT Recent Labs    01/08/23 0418  PROT 5.7*  ALBUMIN 2.9*  AST 5,812*  ALT 2,397*  ALKPHOS 210*  BILITOT 1.5*  BILIDIR 0.5*  IBILI 1.0*   PT/INR Recent Labs    01/08/23 0645  LABPROT 14.1  INR 1.1   Hepatitis Panel Recent Labs    01/08/23 0418  HEPBSAG NON REACTIVE  HCVAB PENDING  HEPAIGM PENDING  HEPBIGM PENDING      IMPRESSION:  #48 89 year old white female with acute  epigastric/lower chest pain nausea and self-limited vomiting onset last p.m.  This is in association with starting nitrofurantoin yesterday for urinary tract infection, took 2 doses.  Workup most pertinent for severe transaminitis.  She has not had any documented hypotension to suggest shock liver Acute  hepatitis panel negative Likely this is an acute DI LI (drug-induced liver injury) secondary to nitrofurantoin. Rule out portal vein thrombosis  INR is currently normal, patient is feeling better, lactate improving, and fortunately does not appear to have acute hepatic failure at this time.  #2 history of chronic constipation #3.  Hyperlipidemia/on Lipitor #4.  Hypertension #5.  Diabetes mellitus #6.  Prior TIA #7 status post cholecystectomy  PLAN: Doppler ultrasound liver ordered stat-pending Trend daily LFTs and INR Stop nitrofurantoin-should not ever take again Would hold Lipitor for now until LFTs normalize GI will follow with you      PA-C 01/08/2023, 10:31 AM

## 2023-01-08 NOTE — Plan of Care (Signed)
°  Problem: Activity: °Goal: Risk for activity intolerance will decrease °Outcome: Progressing °  °Problem: Clinical Measurements: °Goal: Respiratory complications will improve °Outcome: Not Progressing °  °

## 2023-01-08 NOTE — ED Provider Notes (Signed)
[facility] Provider Note   CSN: [id number] Arrival date & time: 01/08/23  0043     History  Chief Complaint  Patient presents with   Chest Pain   Emesis    Stacey Armstrong is a 89 y.o. female.  62 female who presents ER today secondary to epigastric pain and nausea.  Patient states this started earlier tonight.  She started nitrofurantoin today for presumed UTI.  She states that she is never had that medication before.  She states that the pain does radiate towards her chest a little bit.  No shortness of breath, diaphoresis or lightheadedness.  No recent illnesses.  No fevers.  Had a loose stool with this but no previous diarrhea or constipation.   Chest Pain Associated symptoms: vomiting   Emesis      Home Medications Prior to Admission medications   Medication Sig Start Date End Date Taking? Authorizing Provider  Accu-Chek FastClix Lancets MISC 1 each by Other route daily. 10/14/22   Philip Aspen, Limmie Patricia, MD  amLODipine (NORVASC) 10 MG tablet TAKE 1 TABLET EVERY DAY 12/01/22   Philip Aspen, Limmie Patricia, MD  aspirin EC 81 MG tablet Take 81 mg by mouth in the morning.    [provider]  aspirin-acetaminophen-caffeine (EXCEDRIN MIGRAINE) [phone] MG tablet Take 1-2 tablets by mouth daily as needed for headache or migraine.    [provider]  atorvastatin (LIPITOR) 20 MG tablet TAKE 1 TABLET EVERY DAY 12/18/22   Philip Aspen, Limmie Patricia, MD  bisoprolol (ZEBETA) 5 MG tablet TAKE 1 TABLET EVERY DAY 09/24/22   Philip Aspen, Limmie Patricia, MD  Blood Glucose Monitoring Suppl (TRUE METRIX METER) DEVI 1 each by Does not apply route daily. 09/28/20   Philip Aspen, Limmie Patricia, MD  Cholecalciferol (VITAMIN D3) 50 MCG (2000 UT) TABS Take 2,000 Units by mouth in the morning.    [provider]  fluticasone (FLONASE) 50 MCG/ACT nasal spray Place 2 sprays into both nostrils daily. 07/04/22   Rai, Delene Ruffini, MD   furosemide (LASIX) 20 MG tablet Take 1 tablet (20 mg total) by mouth daily. 07/14/22   Philip Aspen, Limmie Patricia, MD  linaclotide [facility]) 290 MCG CAPS capsule Take 1 capsule (290 mcg total) by mouth daily before breakfast. 07/14/22   Philip Aspen, Limmie Patricia, MD  loratadine (CLARITIN) 10 MG tablet Take 1 tablet (10 mg total) by mouth daily. 12/10/22   Philip Aspen, Limmie Patricia, MD  Multiple Vitamins-Minerals (PRESERVISION AREDS 2 PO) Take 1 tablet by mouth in the morning and at bedtime.    [provider]  Omega-3 Fatty Acids (FISH OIL) 1000 MG CAPS Take 2,000 mg by mouth daily.    [provider]  pantoprazole (PROTONIX) 40 MG tablet Take 1 tablet (40 mg total) by mouth daily. 10/14/22   Philip Aspen, Limmie Patricia, MD  Polyethyl Glycol-Propyl Glycol (SYSTANE ULTRA) 0.4-0.3 % SOLN Place 1-2 drops into both eyes 3 (three) times daily as needed (dry/irritated eyes.).    [provider]  polyethylene glycol powder (GLYCOLAX/MIRALAX) 17 GM/SCOOP powder Take 17 g by mouth 2 (two) times daily. 07/03/22   Rai, Ripudeep K, MD  potassium chloride SA (KLOR-CON M20) 20 MEQ tablet Take 1 tablet (20 mEq total) by mouth daily. 10/29/22   Philip Aspen, Limmie Patricia, MD  Probiotic Product (PROBIOTIC PO) Take 1 capsule by mouth daily.    [provider]  psyllium (HYDROCIL/METAMUCIL) 95 % PACK Take 1 packet by  mouth daily. 07/04/22   Rai, Delene Ruffini, MD  TRUE METRIX BLOOD GLUCOSE TEST test strip TEST BLOOD SUGAR EVERY DAY 03/10/22   Philip Aspen, Limmie Patricia, MD      Allergies    Penicillins, Bactrim [sulfamethoxazole-trimethoprim], Coconut oil, and Other    Review of Systems   Review of Systems  Cardiovascular:  Positive for chest pain.  Gastrointestinal:  Positive for vomiting.    Physical Exam Updated Vital Signs BP (!) 150/129   Pulse 65   Temp 98.4 F (36.9 C) (Oral)   Resp 14   SpO2 97%  Physical Exam Vitals and nursing note reviewed.  Constitutional:       Appearance: She is well-developed.  HENT:     Head: Normocephalic and atraumatic.  Cardiovascular:     Rate and Rhythm: Normal rate and regular rhythm.  Pulmonary:     Effort: No respiratory distress.     Breath sounds: No stridor.  Abdominal:     General: There is no distension.     Tenderness: There is abdominal tenderness (epigastric).  Musculoskeletal:     Cervical back: Normal range of motion.  Neurological:     Mental Status: She is alert.     ED Results / Procedures / Treatments   Labs (all labs ordered are listed, but only abnormal results are displayed) Labs Reviewed  CBC WITH DIFFERENTIAL/PLATELET - Abnormal; Notable for the following components:      Result Value   RDW 16.3 (*)    Lymphs Abs 0.2 (*)    All other components within normal limits  CBG MONITORING, ED - Abnormal; Notable for the following components:   Glucose-Capillary 204 (*)    All other components within normal limits  COMPREHENSIVE METABOLIC PANEL  LIPASE, BLOOD  URINALYSIS, W/ REFLEX TO CULTURE (INFECTION SUSPECTED)  TROPONIN I (HIGH SENSITIVITY)    EKG EKG Interpretation Date/Time:  Thursday January 08 2023 00:54:29 EDT Ventricular Rate:  71 PR Interval:  159 QRS Duration:  103 QT Interval:  411 QTC Calculation: 447 R Axis:   39  Text Interpretation: Ectopic atrial rhythm Nonspecific repol abnormality, diffuse leads Confirmed by Marily Memos [phone]) on 01/08/2023 1:37:42 AM  Radiology DG Chest Portable 1 View  Result Date: 01/08/2023 CLINICAL DATA:  Chest pain, vomiting EXAM: PORTABLE CHEST 1 VIEW COMPARISON:  Chest radiograph dated 06/29/2022. CT chest dated 03/08/2022. FINDINGS: Mild bibasilar opacities, likely atelectasis. No frank interstitial edema. No pleural effusion or pneumothorax. Heart is top-normal in size.  Thoracic aortic atherosclerosis. IMPRESSION: Mild bibasilar opacities, likely atelectasis. Electronically Signed   By: Charline Bills M.D.   On: 01/08/2023 01:26     Procedures .Critical Care  Performed by: Marily Memos, MD Authorized by: Marily Memos, MD   Critical care provider statement:    Critical care time (minutes):  30   Critical care was necessary to treat or prevent imminent or life-threatening deterioration of the following conditions:  Hepatic failure   Critical care was time spent personally by me on the following activities:  Development of treatment plan with patient or surrogate, discussions with consultants, evaluation of patient's response to treatment, examination of patient, ordering and review of laboratory studies, ordering and review of radiographic studies, ordering and performing treatments and interventions, pulse oximetry, re-evaluation of patient's condition and review of old charts     Medications Ordered in ED Medications  lactated ringers bolus 1,000 mL (1,000 mLs Intravenous New Bag/Given 01/08/23 0113)  ondansetron (ZOFRAN) injection 4 mg (4 mg Intravenous  Given 01/08/23 0113)    ED Course/ Medical Decision Making/ A&P                                 Medical Decision Making Amount and/or Complexity of Data Reviewed Labs: ordered. Radiology: ordered. ECG/medicine tests: ordered.  Risk OTC drugs. Prescription drug management. Decision regarding hospitalization.  Possibly reaction to the nitrofurantoin? Possibly gastritis, pancreatitis, cardiac, PUD? Will eval for same and treat symptoms Patient's liver enzymes came back markedly elevated.  Denies alcohol, drugs, history of hepatitis or excessive Tylenol use.  Her ultrasound showed a mildly nodular liver with no other obvious abnormalities.  Went ahead and rechecked them and they rose even higher.  Her symptoms have all improved at this point.  Cardiac workup negative.  I discussed with gastroenterology, Dr. Lavon Paganini, and sounds like it is a classic pattern for shock liver.  There is no history of hypotension or fall/syncope.  Concern for possible  intra-abdominal pathology.  They will consult and recommend further workup.  At this time suggested fluids and adding a CK to make sure is not rhabdo.  Also PT/INR and lactic acid are pending to evaluate for general liver failure. Hospitalist paged, pending call back.   It does appear that nitrofurantoin can cause liver damage, not sure if it would cause this level of liver damage with just one dose?  Final Clinical Impression(s) / ED Diagnoses Final diagnoses:  None    Rx / DC Orders ED Discharge Orders     None         , Barbara Cower, MD 01/08/23 [phone]

## 2023-01-08 NOTE — H&P (Signed)
History and Physical    Stacey Armstrong ZOX:[id number] DOB: 01/14/1928 DOA: 01/08/2023  PCP: Stacey Armstrong, Stacey Patricia, MD  Patient coming from: home  I have personally briefly reviewed patient's old medical records in [facility]  Chief Complaint: n/v/chest pain   HPI: Stacey Armstrong is a 89 y.o. female with medical history significant of  Arthritis, DMII, hx of DVT post op , GERD, OSA, CKDIIIa,hypertension, hyperlipidemiaWho has history of recent diagnosis of UTI for which patient has been treated with Macrobid. Patient now presents to ED on 8/15 with n/v/dizziness and chest discomfort. Patient notes she started to feel poorly over the last 24 hours, she notes sob/ n/v/ abdominal pain. She also notes rhinorrhea as well. Currently  however she notes her symptoms have resolved  Although she still notes some respiratory congestion.  ED Course:  In ED on evaluation patient found to have significantly elevated LFTS With concern for viral hepatitis vs DILI due to Macrobid.  Case discussed with GI who will consult.  Vitals:  Afeb, bp 183/89, HR71, rr 12, sat 98% on ra   Labs: Wbc 7.1, hgb 12, plt 255,  Na 136, K 4, glu 226, cr1.56 ( 1.4) Lactic 2.6, 2.3 Tylenol level<10 ETOH level<10 [id number] repeat 5812, ALT 1495 repeat 2397 alphos 208 UA: + protein, bacteria rare Hep paneL neg RUQ u/s:NAD  IMPRESSION: Status post cholecystectomy.   Mild nodularity to the liver which may represent some underlying cirrhosis. No discrete mass is seen.   CE10,15 Cxr: IMPRESSION: Mild bibasilar opacities, likely atelectasis.    Tx 1LR, maalox,protonix   Review of Systems: As per HPI otherwise 10 point review of systems negative.   Past Medical History:  Diagnosis Date   Arthritis    Bronchitis    Complication of anesthesia    after gallbladder surgery with extubation had shallow respirations,  needed to continue intudation for additonal 10-12 days   Diabetes (HCC)    DVT (deep venous  thrombosis) (HCC)    left leg after hysterectomy   GERD (gastroesophageal reflux disease)    History of COVID-19 05/2021   History of kidney stones    History of transient ischemic attack (TIA)    HOH (hard of hearing)    Hyperlipidemia    Hypertension    Kidney stones 2023   Sleep apnea     Past Surgical History:  Procedure Laterality Date   ABDOMINAL HYSTERECTOMY     APPENDECTOMY     CATARACT EXTRACTION, BILATERAL     CHOLECYSTECTOMY     COLONOSCOPY     CYSTOSCOPY WITH STENT PLACEMENT Right 08/14/2021   Procedure: CYSTOSCOPY WITH STENT PLACEMENT;  Surgeon: Crist Fat, MD;  Location: [facility];  Service: Urology;  Laterality: Right;  ONLY NEEDS 30 MIN   EXTRACORPOREAL SHOCK WAVE LITHOTRIPSY Right 02/06/2022   Procedure: RIGHT EXTRACORPOREAL SHOCK WAVE LITHOTRIPSY (ESWL);  Surgeon: Crist Fat, MD;  Location: [facility];  Service: Urology;  Laterality: Right;     reports that she has quit smoking. She has never used smokeless tobacco. She reports that she does not currently use alcohol. She reports that she does not use drugs.  Allergies  Allergen Reactions   Penicillins Hives   Bactrim [Sulfamethoxazole-Trimethoprim] Nausea And Vomiting   Coconut Oil Hives, Swelling and Rash   Other Hives, Swelling and Rash    Berries     Family History  Problem Relation Age of Onset   CAD Maternal Grandmother    Heart disease Maternal  Grandmother    Pneumonia Maternal Grandfather    Liver disease Neg Hx    Colon cancer Neg Hx    Esophageal cancer Neg Hx     Prior to Admission medications   Medication Sig Start Date End Date Taking? Authorizing Provider  Accu-Chek FastClix Lancets MISC 1 each by Other route daily. 10/14/22   Stacey Armstrong, Stacey Patricia, MD  amLODipine (NORVASC) 10 MG tablet TAKE 1 TABLET EVERY DAY 12/01/22   Stacey Armstrong, Stacey Patricia, MD  aspirin EC 81 MG tablet Take 81 mg by mouth in the morning.    [provider]   aspirin-acetaminophen-caffeine (EXCEDRIN MIGRAINE) [id number] MG tablet Take 1-2 tablets by mouth daily as needed for headache or migraine.    [provider]  atorvastatin (LIPITOR) 20 MG tablet TAKE 1 TABLET EVERY DAY 12/18/22   Stacey Armstrong, Stacey Patricia, MD  bisoprolol (ZEBETA) 5 MG tablet TAKE 1 TABLET EVERY DAY 09/24/22   Stacey Armstrong, Stacey Patricia, MD  Blood Glucose Monitoring Suppl (TRUE METRIX METER) DEVI 1 each by Does not apply route daily. 09/28/20   Stacey Armstrong, Stacey Patricia, MD  Cholecalciferol (VITAMIN D3) 50 MCG (2000 UT) TABS Take 2,000 Units by mouth in the morning.    [provider]  fluticasone (FLONASE) 50 MCG/ACT nasal spray Place 2 sprays into both nostrils daily. 07/04/22   Rai, Delene Ruffini, MD  furosemide (LASIX) 20 MG tablet Take 1 tablet (20 mg total) by mouth daily. 07/14/22   Stacey Armstrong, Stacey Patricia, MD  linaclotide [facility]) 290 MCG CAPS capsule Take 1 capsule (290 mcg total) by mouth daily before breakfast. 07/14/22   Stacey Armstrong, Stacey Patricia, MD  loratadine (CLARITIN) 10 MG tablet Take 1 tablet (10 mg total) by mouth daily. 12/10/22   Stacey Armstrong, Stacey Patricia, MD  Multiple Vitamins-Minerals (PRESERVISION AREDS 2 PO) Take 1 tablet by mouth in the morning and at bedtime.    [provider]  Omega-3 Fatty Acids (FISH OIL) 1000 MG CAPS Take 2,000 mg by mouth daily.    [provider]  pantoprazole (PROTONIX) 40 MG tablet Take 1 tablet (40 mg total) by mouth daily. 10/14/22   Stacey Armstrong, Stacey Patricia, MD  Polyethyl Glycol-Propyl Glycol (SYSTANE ULTRA) 0.4-0.3 % SOLN Place 1-2 drops into both eyes 3 (three) times daily as needed (dry/irritated eyes.).    [provider]  polyethylene glycol powder (GLYCOLAX/MIRALAX) 17 GM/SCOOP powder Take 17 g by mouth 2 (two) times daily. 07/03/22   Rai, Ripudeep K, MD  potassium chloride SA (KLOR-CON M20) 20 MEQ tablet Take 1 tablet (20 mEq total) by mouth daily. 10/29/22   Stacey Armstrong, Stacey Patricia, MD  Probiotic Product (PROBIOTIC PO) Take 1 capsule by mouth daily.    [provider]  psyllium (HYDROCIL/METAMUCIL) 95 % PACK Take 1 packet by mouth daily. 07/04/22   Rai, Delene Ruffini, MD  TRUE METRIX BLOOD GLUCOSE TEST test strip TEST BLOOD SUGAR EVERY DAY 03/10/22   Stacey Armstrong, Stacey Patricia, MD    Physical Exam: Vitals:   01/08/23 0800 01/08/23 0815 01/08/23 0827 01/08/23 0931  BP: (!) 160/50  (!) 126/114 (!) 163/49  Pulse: (!) 56 63 60 (!) 58  Resp: 18 20 19 18   Temp:   97.8 F (36.6 C) 97.6 F (36.4 C)  TempSrc:   Oral Oral  SpO2: 98% 98% 98% 98%    Constitutional: NAD, calm, comfortable Vitals:   01/08/23 0800 01/08/23 0815 01/08/23 0827 01/08/23 0931  BP: (!) 160/50  Marland Kitchen)  126/114 (!) 163/49  Pulse: (!) 56 63 60 (!) 58  Resp: 18 20 19 18   Temp:   97.8 F (36.6 C) 97.6 F (36.4 C)  TempSrc:   Oral Oral  SpO2: 98% 98% 98% 98%   Eyes: PERRL, lids and conjunctivae normal ENMT: Mucous membranes are moist. Posterior pharynx clear of any exudate or lesions.Normal dentition.  Neck: normal, supple, no masses, no thyromegaly Respiratory: b/l crackles. Normal respiratory effort. No accessory muscle use.  Cardiovascular: Regular rate and rhythm, no murmurs / rubs / gallops. Trace extremity edema. 2+ pedal pulses.  Abdomen: no tenderness, no masses palpated. No hepatosplenomegaly. Bowel sounds positive.  Musculoskeletal: no clubbing / cyanosis. No joint deformity upper and lower extremities. Good ROM, no contractures. Normal muscle tone.  Skin: no rashes, lesions, ulcers. No induration Neurologic: CN 2-12 grossly intact. Sensation intact,  Strength 5/5 in all 4.  Psychiatric: Normal judgment and insight. Alert and oriented x 3. Normal mood.    Labs on Admission: I have personally reviewed following labs and imaging studies  CBC: Recent Labs  Lab 01/08/23 0109  WBC 7.1  NEUTROABS 6.1  HGB 12.0  HCT 38.5  MCV 83.7  PLT 255   Basic Metabolic Panel: Recent  Labs  Lab 01/08/23 0109  NA 136  K 4.0  CL 98  CO2 22  GLUCOSE 226*  BUN 27*  CREATININE 1.56*  CALCIUM 9.4   GFR: CrCl cannot be calculated (Unknown ideal weight.). Liver Function Tests: Recent Labs  Lab 01/08/23 0109 01/08/23 0418  AST 3,779* 5,812*  ALT 1,495* 2,397*  ALKPHOS 208* 210*  BILITOT 1.1 1.5*  PROT 6.4* 5.7*  ALBUMIN 3.3* 2.9*   Recent Labs  Lab 01/08/23 0109  LIPASE 35   No results for input(s): "AMMONIA" in the last 168 hours. Coagulation Profile: Recent Labs  Lab 01/08/23 0645  INR 1.1   Cardiac Enzymes: Recent Labs  Lab 01/08/23 0645  CKTOTAL 18*   BNP (last 3 results) No results for input(s): "PROBNP" in the last 8760 hours. HbA1C: No results for input(s): "HGBA1C" in the last 72 hours. CBG: Recent Labs  Lab 01/08/23 0117  GLUCAP 204*   Lipid Profile: No results for input(s): "CHOL", "HDL", "LDLCALC", "TRIG", "CHOLHDL", "LDLDIRECT" in the last 72 hours. Thyroid Function Tests: No results for input(s): "TSH", "T4TOTAL", "FREET4", "T3FREE", "THYROIDAB" in the last 72 hours. Anemia Panel: No results for input(s): "VITAMINB12", "FOLATE", "FERRITIN", "TIBC", "IRON", "RETICCTPCT" in the last 72 hours. Urine analysis:    Component Value Date/Time   COLORURINE AMBER (A) 01/08/2023 0334   APPEARANCEUR CLOUDY (A) 01/08/2023 0334   LABSPEC 1.014 01/08/2023 0334   PHURINE 6.0 01/08/2023 0334   GLUCOSEU NEGATIVE 01/08/2023 0334   GLUCOSEU NEGATIVE 10/29/2022 1218   HGBUR NEGATIVE 01/08/2023 0334   BILIRUBINUR NEGATIVE 01/08/2023 0334   BILIRUBINUR neg 10/29/2022 1206   KETONESUR 5 (A) 01/08/2023 0334   PROTEINUR >=300 (A) 01/08/2023 0334   UROBILINOGEN 0.2 10/29/2022 1218   UROBILINOGEN 0.2 10/29/2022 1206   NITRITE NEGATIVE 01/08/2023 0334   LEUKOCYTESUR NEGATIVE 01/08/2023 0334    Radiological Exams on Admission: US Abdomen Limited RUQ (LIVER/GB)  Result Date: 01/08/2023 CLINICAL DATA:  Abdominal pain EXAM: ULTRASOUND ABDOMEN  LIMITED RIGHT UPPER QUADRANT COMPARISON:  None Available. FINDINGS: Gallbladder: Surgically removed Common bile duct: Diameter: 7.5 mm Liver: Diffusely increased in echogenicity. Mild nodularity is noted. No discrete mass is seen. Portal vein is patent on color Doppler imaging with normal direction of blood flow towards the liver.  Other: None. IMPRESSION: Status post cholecystectomy. Mild nodularity to the liver which may represent some underlying cirrhosis. No discrete mass is seen. Electronically Signed   By: Alcide Clever M.D.   On: 01/08/2023 03:19   DG Chest Portable 1 View  Result Date: 01/08/2023 CLINICAL DATA:  Chest pain, vomiting EXAM: PORTABLE CHEST 1 VIEW COMPARISON:  Chest radiograph dated 06/29/2022. CT chest dated 03/08/2022. FINDINGS: Mild bibasilar opacities, likely atelectasis. No frank interstitial edema. No pleural effusion or pneumothorax. Heart is top-normal in size.  Thoracic aortic atherosclerosis. IMPRESSION: Mild bibasilar opacities, likely atelectasis. Electronically Signed   By: Charline Bills M.D.   On: 01/08/2023 01:26    EKG: Independently reviewed. Sinus -no hyperacute st-twave changes  Assessment/Plan  Acute drug induced liver injury due to Macrobid  -hold offending agent -of note no hx of etoh , acute help panel negative -patient with recent initiation of Macrobid for treatment of UTI -await further gi recs  - continue with iv hydration   Chest pain  -initial cardiac evaluation notes ce wnl  - presumed due to n/v -element of gastritis - place on ppi  -s/p gi cocktail in ED  Shortness of breath - b/l crackles at bases  --? Possibility of aspiration pneumonitis vs Macrobid lung injury although less likely -possibly  related to fluid overload / respiratory infection  -check bnp /CT chest /respiratory panel -patient on O2 currently no note of hypoxemia on vital chart  -wean O2 as able to at bedtime only ( hx of OSA)   Hx of UTI  - current UA negative    Arthritis -no active issues    DMII -diet controlled  -of all meds per last pcp note -fs and monitor    Hx of DVT post op  -scd  -due sensitivity to heparin    GERD -no active issu  -ppi    OSA -O2 at bedtime  -monitor continuous pulse ox    A CKDIIIa -at baseline    Hypertension -resume home regimen once med rec completed   Hyperlipidemia -hold statin due to elevated lfts   IBS -no active issue  DVT prophylaxis: scd Code Status: / as discussed per patient wishes in event of cardiac arrest  Family Communication: none at bedside Disposition Plan: patient  expected to be admitted greater than 2 midnights  Consults called: GI  Admission status: med tele   Lurline Del MD [facility]   If 7PM-7AM, please contact night-coverage [email or web address] Password [facility]  01/08/2023, 12:07 PM

## 2023-01-08 NOTE — ED Triage Notes (Signed)
Patient arrives via EMS from home. Patient was seen by PCP for a UTI and given Macrobid. Tonight the patient began having chest pain, vomiting and dizziness.

## 2023-01-08 NOTE — ED Notes (Signed)
2L Nasal cannula restarted while patient is asleep due to oxygen saturation being 91%.

## 2023-01-08 NOTE — ED Notes (Signed)
ED TO INPATIENT HANDOFF REPORT  ED Nurse Name and Phone #: Tori [phone]  S Name/Age/Gender Stacey Armstrong 89 y.o. female Room/Bed: TRACC/TRACC  Code Status   Code Status: Prior  Home/SNF/Other Home Patient oriented to: self, place, time, and situation Is this baseline? Yes   Triage Complete: Triage complete  Chief Complaint Abnormal LFTs [R79.89]  Triage Note Patient arrives via EMS from home. Patient was seen by PCP for a UTI and given Macrobid. Tonight the patient began having chest pain, vomiting and dizziness.   Allergies Allergies  Allergen Reactions   Penicillins Hives   Bactrim [Sulfamethoxazole-Trimethoprim] Nausea And Vomiting   Coconut Oil Hives, Swelling and Rash   Other Hives, Swelling and Rash    Berries     Level of Care/Admitting Diagnosis ED Disposition     ED Disposition  Admit   Condition  --   Comment  Hospital Area: [facility] [[id number]]  Level of Care: Telemetry Medical [104]  May admit patient to [facility] or [facility] if equivalent level of care is available:: No  Covid Evaluation: Asymptomatic - no recent exposure (last 10 days) testing not required  Diagnosis: Abnormal LFTs [161096]  Admitting Physician: Lurline Del [[id number]]  Attending Physician: Lurline Del [[id number]]  Certification:: I certify this patient will need inpatient services for at least 2 midnights  Expected Medical Readiness: 01/13/2023          B Medical/Surgery History Past Medical History:  Diagnosis Date   Arthritis    Bronchitis    Complication of anesthesia    after gallbladder surgery with extubation had shallow respirations,  needed to continue intudation for additonal 10-12 days   Diabetes (HCC)    DVT (deep venous thrombosis) (HCC)    left leg after hysterectomy   GERD (gastroesophageal reflux disease)    History of COVID-19 05/2021   History of kidney stones    History of transient ischemic attack (TIA)    HOH  (hard of hearing)    Hyperlipidemia    Hypertension    Kidney stones 2023   Sleep apnea    Past Surgical History:  Procedure Laterality Date   ABDOMINAL HYSTERECTOMY     APPENDECTOMY     CATARACT EXTRACTION, BILATERAL     CHOLECYSTECTOMY     COLONOSCOPY     CYSTOSCOPY WITH STENT PLACEMENT Right 08/14/2021   Procedure: CYSTOSCOPY WITH STENT PLACEMENT;  Surgeon: Crist Fat, MD;  Location: [facility];  Service: Urology;  Laterality: Right;  ONLY NEEDS 30 MIN   EXTRACORPOREAL SHOCK WAVE LITHOTRIPSY Right 02/06/2022   Procedure: RIGHT EXTRACORPOREAL SHOCK WAVE LITHOTRIPSY (ESWL);  Surgeon: Crist Fat, MD;  Location: [facility];  Service: Urology;  Laterality: Right;     A IV Location/Drains/Wounds Patient Lines/Drains/Airways Status     Active Line/Drains/Airways     Name Placement date Placement time Site Days   Peripheral IV 01/08/23 20 G Right Antecubital 01/08/23  0105  Antecubital  less than 1   Airway 08/14/21  0610  -- 512            Intake/Output Last 24 hours No intake or output data in the 24 hours ending 01/08/23 0825  Labs/Imaging Results for orders placed or performed during the hospital encounter of 01/08/23 (from the past 48 hour(s))  CBC with Differential     Status: Abnormal   Collection Time: 01/08/23  1:09 AM  Result Value Ref Range   WBC 7.1 4.0 -  10.5 K/uL   RBC 4.60 3.87 - 5.11 MIL/uL   Hemoglobin 12.0 12.0 - 15.0 g/dL   HCT 29.5 28.4 - 13.2 %   MCV 83.7 80.0 - 100.0 fL   MCH 26.1 26.0 - 34.0 pg   MCHC 31.2 30.0 - 36.0 g/dL   RDW 44.0 (H) 10.2 - 72.5 %   Platelets 255 150 - 400 K/uL   nRBC 0.0 0.0 - 0.2 %   Neutrophils Relative % 87 %   Neutro Abs 6.1 1.7 - 7.7 K/uL   Lymphocytes Relative 3 %   Lymphs Abs 0.2 (L) 0.7 - 4.0 K/uL   Monocytes Relative 10 %   Monocytes Absolute 0.7 0.1 - 1.0 K/uL   Eosinophils Relative 0 %   Eosinophils Absolute 0.0 0.0 - 0.5 K/uL   Basophils Relative 0 %   Basophils Absolute 0.0  0.0 - 0.1 K/uL   Immature Granulocytes 0 %   Abs Immature Granulocytes 0.02 0.00 - 0.07 K/uL    Comment: Performed at [facility], [address], [place], [place] [address]  Comprehensive metabolic panel     Status: Abnormal   Collection Time: 01/08/23  1:09 AM  Result Value Ref Range   Sodium 136 135 - 145 mmol/L   Potassium 4.0 3.5 - 5.1 mmol/L   Chloride 98 98 - 111 mmol/L   CO2 22 22 - 32 mmol/L   Glucose, Bld 226 (H) 70 - 99 mg/dL    Comment: Glucose reference range applies only to samples taken after fasting for at least 8 hours.   BUN 27 (H) 8 - 23 mg/dL   Creatinine, Ser 0.34 (H) 0.44 - 1.00 mg/dL   Calcium 9.4 8.9 - 74.2 mg/dL   Total Protein 6.4 (L) 6.5 - 8.1 g/dL   Albumin 3.3 (L) 3.5 - 5.0 g/dL   AST 5,956 (H) 15 - 41 U/L    Comment: RESULT CONFIRMED BY MANUAL DILUTION   ALT 1,495 (H) 0 - 44 U/L   Alkaline Phosphatase 208 (H) 38 - 126 U/L   Total Bilirubin 1.1 0.3 - 1.2 mg/dL   GFR, Estimated 31 (L) >60 mL/min    Comment: (NOTE) Calculated using the CKD-EPI Creatinine Equation (2021)    Anion gap 16 (H) 5 - 15    Comment: Performed at [facility], [address] [place], [place] [address]  Lipase, blood     Status: None   Collection Time: 01/08/23  1:09 AM  Result Value Ref Range   Lipase 35 11 - 51 U/L    Comment: Performed at [facility], [address], [place], [place] [address]  Troponin I (High Sensitivity)     Status: None   Collection Time: 01/08/23  1:09 AM  Result Value Ref Range   Troponin I (High Sensitivity) 10 <18 ng/L    Comment: (NOTE) Elevated high sensitivity troponin I (hsTnI) values and significant  changes across serial measurements may suggest ACS but many other  chronic and acute conditions are known to elevate hsTnI results.  Refer to the "Links" section for chest pain algorithms and additional  guidance. Performed at [facility], [address] [place], [place] [address]   CBG monitoring, ED     Status:  Abnormal   Collection Time: 01/08/23  1:17 AM  Result Value Ref Range   Glucose-Capillary 204 (H) 70 - 99 mg/dL    Comment: Glucose reference range applies only to samples taken after fasting  for at least 8 hours.  Troponin I (High Sensitivity)     Status: None   Collection Time: 01/08/23  3:19 AM  Result Value Ref Range   Troponin I (High Sensitivity) 15 <18 ng/L    Comment: (NOTE) Elevated high sensitivity troponin I (hsTnI) values and significant  changes across serial measurements may suggest ACS but many other  chronic and acute conditions are known to elevate hsTnI results.  Refer to the "Links" section for chest pain algorithms and additional  guidance. Performed at [facility], [address], [place], [place] [address]   Urinalysis, w/ Reflex to Culture (Infection Suspected) -Urine, Clean Catch     Status: Abnormal   Collection Time: 01/08/23  3:34 AM  Result Value Ref Range   Specimen Source URINE, CLEAN CATCH    Color, Urine AMBER (A) YELLOW    Comment: BIOCHEMICALS MAY BE AFFECTED BY COLOR   APPearance CLOUDY (A) CLEAR   Specific Gravity, Urine 1.014 1.005 - 1.030   pH 6.0 5.0 - 8.0   Glucose, UA NEGATIVE NEGATIVE mg/dL   Hgb urine dipstick NEGATIVE NEGATIVE   Bilirubin Urine NEGATIVE NEGATIVE   Ketones, ur 5 (A) NEGATIVE mg/dL   Protein, ur >=604 (A) NEGATIVE mg/dL   Nitrite NEGATIVE NEGATIVE   Leukocytes,Ua NEGATIVE NEGATIVE   RBC / HPF 0-5 0 - 5 RBC/hpf   WBC, UA 0-5 0 - 5 WBC/hpf    Comment:        Reflex urine culture not performed if WBC <=10, OR if Squamous epithelial cells >5. If Squamous epithelial cells >5 suggest recollection.    Bacteria, UA RARE (A) NONE SEEN   Squamous Epithelial / HPF 0-5 0 - 5 /HPF   Mucus PRESENT     Comment: Performed at [facility], [address], [place], [place] [address]  Hepatic function panel     Status: Abnormal   Collection Time: 01/08/23  4:18 AM  Result Value Ref Range   Total Protein 5.7 (L) 6.5  - 8.1 g/dL   Albumin 2.9 (L) 3.5 - 5.0 g/dL   AST 1,191 (H) 15 - 41 U/L    Comment: RESULT CONFIRMED BY MANUAL DILUTION   ALT 2,397 (H) 0 - 44 U/L    Comment: RESULT CONFIRMED BY MANUAL DILUTION   Alkaline Phosphatase 210 (H) 38 - 126 U/L   Total Bilirubin 1.5 (H) 0.3 - 1.2 mg/dL   Bilirubin, Direct 0.5 (H) 0.0 - 0.2 mg/dL   Indirect Bilirubin 1.0 (H) 0.3 - 0.9 mg/dL    Comment: Performed at [facility], [address], [place], [place] [address]  Protime-INR     Status: None   Collection Time: 01/08/23  6:45 AM  Result Value Ref Range   Prothrombin Time 14.1 11.4 - 15.2 seconds   INR 1.1 0.8 - 1.2    Comment: (NOTE) INR goal varies based on device and disease states. Performed at [facility], [address], [place], [place] [address]   Lactic acid, plasma     Status: Abnormal   Collection Time: 01/08/23  6:45 AM  Result Value Ref Range   Lactic Acid, Venous 2.6 (HH) 0.5 - 1.9 mmol/L    Comment: CRITICAL RESULT CALLED TO, READ BACK BY AND VERIFIED WITH L.CARSON RN [phone] 01/08/2023 BY G.GANADEN Performed at [facility], [address] [place], [place] [address]   CK     Status: Abnormal   Collection Time: 01/08/23  6:45 AM  Result Value Ref Range   Total CK 18 (L) 38 - 234 U/L    Comment: Performed at [facility], [address] [place], [place] [address]   US Abdomen Limited RUQ (LIVER/GB)  Result Date: 01/08/2023 CLINICAL DATA:  Abdominal pain EXAM: ULTRASOUND ABDOMEN LIMITED RIGHT UPPER QUADRANT COMPARISON:  None Available. FINDINGS: Gallbladder: Surgically removed Common bile duct: Diameter: 7.5 mm Liver: Diffusely increased in echogenicity. Mild nodularity is noted. No discrete mass is seen. Portal vein is patent on color Doppler imaging with normal direction of blood flow towards the liver. Other: None. IMPRESSION: Status post cholecystectomy. Mild nodularity to the liver which may represent some underlying cirrhosis. No discrete mass is seen.  Electronically Signed   By: Alcide Clever M.D.   On: 01/08/2023 03:19   DG Chest Portable 1 View  Result Date: 01/08/2023 CLINICAL DATA:  Chest pain, vomiting EXAM: PORTABLE CHEST 1 VIEW COMPARISON:  Chest radiograph dated 06/29/2022. CT chest dated 03/08/2022. FINDINGS: Mild bibasilar opacities, likely atelectasis. No frank interstitial edema. No pleural effusion or pneumothorax. Heart is top-normal in size.  Thoracic aortic atherosclerosis. IMPRESSION: Mild bibasilar opacities, likely atelectasis. Electronically Signed   By: Charline Bills M.D.   On: 01/08/2023 01:26    Pending Labs Unresulted Labs (From admission, onward)     Start     Ordered   01/08/23 0803  Acetaminophen level  Once,   STAT        01/08/23 0802   01/08/23 0803  Ethanol  Once,   URGENT        01/08/23 0802   01/08/23 0803  Urine rapid drug screen (hosp performed)  Once,   STAT        01/08/23 0802   01/08/23 0645  Lactic acid, plasma  (Lactic Acid)  Now then every 2 hours,   R (with STAT occurrences)      01/08/23 0644   01/08/23 0411  Hepatitis panel, acute  Once,   URGENT        01/08/23 0411            Vitals/Pain Today's Vitals   01/08/23 0530 01/08/23 0600 01/08/23 0630 01/08/23 0700  BP: (!) 151/48 (!) 155/47 (!) 169/48 (!) 169/48  Pulse: (!) 56 (!) 58 (!) 57 (!) 57  Resp: 20 (!) 21 19 19   Temp:      TempSrc:      SpO2: 99% 98% 99% 99%    Isolation Precautions No active isolations  Medications Medications  lactated ringers bolus 1,000 mL (0 mLs Intravenous Stopped 01/08/23 0319)  ondansetron (ZOFRAN) injection 4 mg (4 mg Intravenous Given 01/08/23 0113)  pantoprazole (PROTONIX) injection 40 mg (40 mg Intravenous Given 01/08/23 0319)  alum & mag hydroxide-simeth (MAALOX/MYLANTA) 200-200-20 MG/5ML suspension 30 mL (30 mLs Oral Given 01/08/23 0319)    And  lidocaine (XYLOCAINE) 2 % viscous mouth solution 15 mL (15 mLs Oral Given 01/08/23 0319)  lactated ringers bolus 1,000 mL (1,000 mLs  Intravenous New Bag/Given 01/08/23 8295)    Mobility walks with device     Focused Assessments Cardiac Assessment Handoff:    Lab Results  Component Value Date   CKTOTAL 18 (L) 01/08/2023   Lab Results  Component Value Date   DDIMER 1.29 (H) 09/05/2021   Does the Patient currently have chest pain? No    R Recommendations: See Admitting Provider Note  Report given to:   Additional Notes: Pt hard of hearing, family at bedside

## 2023-01-09 DIAGNOSIS — K719 Toxic liver disease, unspecified: Secondary | ICD-10-CM | POA: Diagnosis not present

## 2023-01-09 DIAGNOSIS — R7989 Other specified abnormal findings of blood chemistry: Secondary | ICD-10-CM | POA: Diagnosis not present

## 2023-01-09 LAB — COMPREHENSIVE METABOLIC PANEL
ALT: 1178 U/L — ABNORMAL HIGH (ref 0–44)
AST: 1078 U/L — ABNORMAL HIGH (ref 15–41)
Albumin: 2.5 g/dL — ABNORMAL LOW (ref 3.5–5.0)
Alkaline Phosphatase: 201 U/L — ABNORMAL HIGH (ref 38–126)
Anion gap: 6 (ref 5–15)
BUN: 26 mg/dL — ABNORMAL HIGH (ref 8–23)
CO2: 27 mmol/L (ref 22–32)
Calcium: 8.7 mg/dL — ABNORMAL LOW (ref 8.9–10.3)
Chloride: 103 mmol/L (ref 98–111)
Creatinine, Ser: 1.56 mg/dL — ABNORMAL HIGH (ref 0.44–1.00)
GFR, Estimated: 31 mL/min — ABNORMAL LOW (ref >60)
Glucose, Bld: 132 mg/dL — ABNORMAL HIGH (ref 70–99)
Potassium: 4.5 mmol/L (ref 3.5–5.1)
Sodium: 136 mmol/L (ref 135–145)
Total Bilirubin: 1.3 mg/dL — ABNORMAL HIGH (ref 0.3–1.2)
Total Protein: 5.5 g/dL — ABNORMAL LOW (ref 6.5–8.1)

## 2023-01-09 LAB — CBC
HCT: 32.8 % — ABNORMAL LOW (ref 36.0–46.0)
Hemoglobin: 10.1 g/dL — ABNORMAL LOW (ref 12.0–15.0)
MCH: 25.8 pg — ABNORMAL LOW (ref 26.0–34.0)
MCHC: 30.8 g/dL (ref 30.0–36.0)
MCV: 83.9 fL (ref 80.0–100.0)
Platelets: 218 10*3/uL (ref 150–400)
RBC: 3.91 MIL/uL (ref 3.87–5.11)
RDW: 16.8 % — ABNORMAL HIGH (ref 11.5–15.5)
WBC: 6.6 10*3/uL (ref 4.0–10.5)
nRBC: 0 % (ref 0.0–0.2)

## 2023-01-09 LAB — PROTIME-INR
INR: 1.1 (ref 0.8–1.2)
Prothrombin Time: 14.8 s (ref 11.4–15.2)

## 2023-01-09 MED ORDER — AMLODIPINE BESYLATE 10 MG PO TABS
10.0000 mg | ORAL_TABLET | Freq: Every day | ORAL | Status: DC
  Administered 2023-01-09 – 2023-01-10 (×2): 10 mg via ORAL
  Filled 2023-01-09 (×2): qty 1

## 2023-01-09 MED ORDER — BISOPROLOL FUMARATE 5 MG PO TABS
5.0000 mg | ORAL_TABLET | Freq: Every day | ORAL | Status: DC
  Administered 2023-01-09 – 2023-01-10 (×2): 5 mg via ORAL
  Filled 2023-01-09 (×2): qty 1

## 2023-01-09 NOTE — Progress Notes (Signed)
Patient ID: Stacey Armstrong, female   DOB: 1927-12-09, 89 y.o.   MRN: [id number]    Progress Note   Subjective   Day # 2 CC; lower chest/epigastric pain nausea, markedly elevated LFTs  Ultrasound Doppler liver yesterday-increased parenchymal echogenicity mildly nodular hepatic contour patent hepatic, portal vein, and splenic vein  Chest CT -mild bilateral lower lobe atelectasis left greater than right trace pleural effusions, 13 mm groundglass nodule posterior right lung apex  Labs today-BUN 26/creatinine 1.56 T. bili 1.3/alk phos 201/AST 1078/ALT 1178-trending down INR 1.1/time 14.8   Patient was sleeping, awakened easily, no complaints of abdominal pain or nausea was able to eat regular food yesterday. No current complaints of shortness of breath she was wheezing a little when she first awakened    Objective   Vital signs in last 24 hours: Temp:  [97.6 F (36.4 C)-98.3 F (36.8 C)] 98.1 F (36.7 C) (08/16 0727) Pulse Rate:  [52-58] 57 (08/16 0727) Resp:  [15-18] 17 (08/16 0727) BP: (146-163)/(43-49) 159/44 (08/16 0727) SpO2:  [95 %-99 %] 99 % (08/16 0727)   General: elderly    white female in NAD Heart:  Regular rate and rhythm; no murmurs Lungs: Respirations even and unlabored, lungs occasional faint wheeze Abdomen:  Soft, nontender and nondistended. Normal bowel sounds. Extremities:  Without edema. Neurologic:  Alert and oriented,  grossly normal neurologically. Psych:  Cooperative. Normal mood and affect.  Intake/Output from previous day: 08/15 0701 - 08/16 0700 In: 840 [P.O.:840] Out: -  Intake/Output this shift: No intake/output data recorded.  Lab Results: Recent Labs    01/08/23 0109 01/09/23 0645  WBC 7.1 6.6  HGB 12.0 10.1*  HCT 38.5 32.8*  PLT 255 218   BMET Recent Labs    01/08/23 0109 01/09/23 0645  NA 136 136  K 4.0 4.5  CL 98 103  CO2 22 27  GLUCOSE 226* 132*  BUN 27* 26*  CREATININE 1.56* 1.56*  CALCIUM 9.4 8.7*   LFT Recent Labs     01/08/23 0418 01/09/23 0645  PROT 5.7* 5.5*  ALBUMIN 2.9* 2.5*  AST 5,812* 1,078*  ALT 2,397* 1,178*  ALKPHOS 210* 201*  BILITOT 1.5* 1.3*  BILIDIR 0.5*  --   IBILI 1.0*  --    PT/INR Recent Labs    01/08/23 1245 01/09/23 0645  LABPROT 13.2 14.8  INR 1.0 1.1    Studies/Results: CT CHEST WO CONTRAST  Result Date: 01/08/2023 CLINICAL DATA:  Shortness of breath EXAM: CT CHEST WITHOUT CONTRAST TECHNIQUE: Multidetector CT imaging of the chest was performed following the standard protocol without IV contrast. RADIATION DOSE REDUCTION: This exam was performed according to the departmental dose-optimization program which includes automated exposure control, adjustment of the mA and/or kV according to patient size and/or use of iterative reconstruction technique. COMPARISON:  Chest radiograph dated 01/08/2023. CTA chest dated 03/08/2022. FINDINGS: Cardiovascular: Heart is top-normal in size. No pericardial effusion. Hypodense blood pool relative to myocardium, suggesting anemia. No evidence of thoracic aortic aneurysm. Atherosclerotic calcifications of the aortic arch. Severe three-vessel coronary atherosclerosis. Mediastinum/Nodes: No suspicious mediastinal lymphadenopathy. Visualized thyroid is unremarkable. Lungs/Pleura: 13 mm ground-glass nodule in the posterior right lung apex, similar to the prior. No new/suspicious pulmonary nodules. Mild centrilobular and paraseptal emphysematous changes, upper lung predominant. Mild bilateral lower lobe atelectasis, left greater than right. Associated trace bilateral pleural effusions. No pneumothorax. Upper Abdomen: Visualized upper abdomen is notable for prior cholecystectomy and vascular calcifications. Musculoskeletal: Moderate degenerative changes of the visualized thoracolumbar spine. IMPRESSION: Mild bilateral  lower lobe atelectasis, left greater than right. Associated trace bilateral pleural effusions. 13 mm ground-glass nodule in the posterior  right lung apex, similar to the prior. In general, follow-up CT chest would be suggested in 2 years. However, given the patient's age, this is unlikely to be clinically significant, and follow-up is considered optional. Aortic Atherosclerosis (ICD10-I70.0) and Emphysema (ICD10-J43.9). Electronically Signed   By: Charline Bills M.D.   On: 01/08/2023 20:53   [place] ABD LTD RUG W/LIVER DOPPLER  Result Date: 01/08/2023 CLINICAL DATA:  Elevated LFTs EXAM: DUPLEX ULTRASOUND OF LIVER TECHNIQUE: Color and duplex Doppler ultrasound was performed to evaluate the hepatic in-flow and out-flow vessels. COMPARISON:  None Available. FINDINGS: Liver: Increased parenchymal echogenicity. Mildly nodular hepatic contour without nodularity. No focal lesion, mass or intrahepatic biliary ductal dilatation. Portal vein size: 1.3 cm Portal Vein Velocities Main Prox:  24.2 cm/sec Main Mid: 29.5 cm/sec Main Dist:  27.1 cm/sec Right: 22.4 cm/sec Left: 20.5 cm/sec Hepatic Vein Velocities Right:  19.6 cm/sec Middle:  18.2 cm/sec Left:  12.3 cm/sec IVC: Present and patent with normal respiratory phasicity. Hepatic Artery Velocity:  89.8 cm/sec Splenic Vein Velocity:  22.6 cm/sec Spleen: 8.9 cm x 2.7 cm x 2.0 cm with a total volume of 25.7 cm^3 (411 cm^3 is upper limit normal) Portal Vein Occlusion/Thrombus: No Splenic Vein Occlusion/Thrombus: No Ascites: None Varices: None IMPRESSION: 1. Patent hepatic vasculature with normal flow directionality. 2. Hepatic steatosis. 3. Mildly nodular liver contour, suggestive of cirrhosis. Electronically Signed   By: Allegra Lai M.D.   On: 01/08/2023 15:08   US Abdomen Limited RUQ (LIVER/GB)  Result Date: 01/08/2023 CLINICAL DATA:  Abdominal pain EXAM: ULTRASOUND ABDOMEN LIMITED RIGHT UPPER QUADRANT COMPARISON:  None Available. FINDINGS: Gallbladder: Surgically removed Common bile duct: Diameter: 7.5 mm Liver: Diffusely increased in echogenicity. Mild nodularity is noted. No discrete mass is seen.  Portal vein is patent on color Doppler imaging with normal direction of blood flow towards the liver. Other: None. IMPRESSION: Status post cholecystectomy. Mild nodularity to the liver which may represent some underlying cirrhosis. No discrete mass is seen. Electronically Signed   By: Alcide Clever M.D.   On: 01/08/2023 03:19   DG Chest Portable 1 View  Result Date: 01/08/2023 CLINICAL DATA:  Chest pain, vomiting EXAM: PORTABLE CHEST 1 VIEW COMPARISON:  Chest radiograph dated 06/29/2022. CT chest dated 03/08/2022. FINDINGS: Mild bibasilar opacities, likely atelectasis. No frank interstitial edema. No pleural effusion or pneumothorax. Heart is top-normal in size.  Thoracic aortic atherosclerosis. IMPRESSION: Mild bibasilar opacities, likely atelectasis. Electronically Signed   By: Charline Bills M.D.   On: 01/08/2023 01:26       Assessment / Plan:    #79 89 year old white female with complaints of lower chest/epigastric pain nausea acute onset after taking 2 initial doses of nitrofurantoin earlier in the day prescribed for UTI  Workup in the ER most pertinent for severe transaminitis with AST/ALT greater than one thousand  Ultrasound shows somewhat nodular appearance of the liver, possible cirrhosis Doppler ultrasound shows patent portal venous system  INR normal Acute hepatitis panel negative No hypotension to suggest possibility of shock liver  LFTs much improved today  Most likely this is an episode of acute drug-induced liver injury secondary to nitrofurantoin  Fortunately she has not had any evidence of hepatic decompensation and expect she will do well with normalization of LFTs over the next couple of weeks.   #2 history of chronic constipation #3.  Hyperlipidemia-on Lipitor #4.  Hypertension #5.  Status postcholecystectomy #6  Adult onset diabetes mellitus #7.  Prior TIA  Plan Continue to trend LFTs and LFTs will need to be followed to normalization as an outpatient Stop  nitrofurantoin and should be on her drug list never to take again Hold Lipitor until LFTs normalize   GI will sign off, call for any questions-we can arrange for outpatient follow-up labs in our office or she can do these through her PCP.     Principal Problem:   Abnormal LFTs     LOS: 1 day   Ibrohim Simmers PA-C 01/09/2023, 8:48 AM

## 2023-01-09 NOTE — Progress Notes (Signed)
[facility] PROGRESS NOTE  Zivah Barnhill (DOB: [birth year]) OZH:[id number] PCP: Philip Aspen, Limmie Patricia, MD  Brief Narrative: Michaila Lortz is a 89 y.o. female with a history of T2DM, HTN, HLD, postoperative DVT, OSA, stage IIIa CKD, GERD, and recently started on macrobid for UTI. She presented to the ED on 01/08/2023 with nausea, vomiting and chest discomfort, found to have elevated LFTs (AST 5,812, ALT 2,397, TBili 1.5).  Subjective: Some nausea, poor oral intake, but willing to try to advance diet. No abdominal pain.   Objective: BP (!) 179/53 (BP Location: Left Arm)   Pulse (!) 59   Temp 98.3 F (36.8 C) (Oral)   Resp 17   SpO2 100%   Gen: Elderly female in no acute distress Pulm: Clear, no crackles currently, nonlabored. Awoken from sleep and after she was awake began having end expiratory wheeze consistent with upper airway origin. No stridor. CV: RRR, no MRG. No pitting edema GI: Soft, NT, ND, +BS Neuro: Alert and oriented. No new focal deficits. Ext: Warm, no deformities Skin: No significant rashes, lesions or ulcers on visualized skin   Assessment & Plan: Drug-induced liver injury: Prevailing suspicion. LFTs improving after holding macrobid. INR and mentation have remained normal. Hepatitis panel negative. Work up revealed patent vasculature, mild nodularity, hepatic steatosis.  - Macrobid added to allergy list.  - GI consulted.  - Monitor 8/17 and discharge if continuing improvement.   Lactic acidemia: Resolved, partially indicative of effective hepatic function.   HTN:  - Order norvasc, bisoprolol pending full med rec. Not reordering lasix as it wasn't yet verified and she hasn't proven tolerance of adequate po intake yet.  Chest discomfort: Resolved. Troponin negative, ECG with nonspecific repolarization changes, no ST elevations, CXR with atelectasis. RVP negative, CT chest with R > L atelectasis.  - Monitor  Shortness of breath: This, too has resolved.  Suspect atelectasis is primary driver.  - Incentive spirometry.    Hx of UTI: No need to substitute additional antimicrobial based on UA here (rare bacteria, no RBCs or WBCs, no urinary symptoms).    T2DM: Diet controlled with HbA1c 6.5% at goal for her age.    Hx of DVT post op: SCDs and mobilize frequently. Pt has reported sensitivity to heparin.   GERD - PPI   OSA: Wean O2 during day.    CKDIIIa: Stable.  - Avoid nephrotoxins.     HLD:  - Hold statin.  Tyrone Nine, MD [facility] [email or web address] 01/09/2023, 2:11 PM

## 2023-01-09 NOTE — Plan of Care (Signed)

## 2023-01-09 NOTE — Plan of Care (Signed)
  Problem: Clinical Measurements: Goal: Ability to maintain clinical measurements within normal limits will improve Outcome: Progressing Goal: Will remain free from infection Outcome: Progressing   

## 2023-01-10 DIAGNOSIS — R7989 Other specified abnormal findings of blood chemistry: Secondary | ICD-10-CM | POA: Diagnosis not present

## 2023-01-10 LAB — COMPREHENSIVE METABOLIC PANEL
ALT: 771 U/L — ABNORMAL HIGH (ref 0–44)
AST: 424 U/L — ABNORMAL HIGH (ref 15–41)
Albumin: 2.6 g/dL — ABNORMAL LOW (ref 3.5–5.0)
Alkaline Phosphatase: 170 U/L — ABNORMAL HIGH (ref 38–126)
Anion gap: 6 (ref 5–15)
BUN: 31 mg/dL — ABNORMAL HIGH (ref 8–23)
CO2: 28 mmol/L (ref 22–32)
Calcium: 8.6 mg/dL — ABNORMAL LOW (ref 8.9–10.3)
Chloride: 100 mmol/L (ref 98–111)
Creatinine, Ser: 1.87 mg/dL — ABNORMAL HIGH (ref 0.44–1.00)
GFR, Estimated: 25 mL/min — ABNORMAL LOW (ref >60)
Glucose, Bld: 119 mg/dL — ABNORMAL HIGH (ref 70–99)
Potassium: 4.8 mmol/L (ref 3.5–5.1)
Sodium: 134 mmol/L — ABNORMAL LOW (ref 135–145)
Total Bilirubin: 0.7 mg/dL (ref 0.3–1.2)
Total Protein: 5.4 g/dL — ABNORMAL LOW (ref 6.5–8.1)

## 2023-01-10 LAB — PROTIME-INR
INR: 0.9 (ref 0.8–1.2)
Prothrombin Time: 12.8 s (ref 11.4–15.2)

## 2023-01-10 MED ORDER — ATORVASTATIN CALCIUM 20 MG PO TABS
20.0000 mg | ORAL_TABLET | Freq: Every day | ORAL | Status: DC
Start: 2023-01-10 — End: 2024-01-06

## 2023-01-10 NOTE — Plan of Care (Signed)

## 2023-01-10 NOTE — Discharge Summary (Signed)
Physician Discharge Summary   Patient: Stacey Armstrong MRN: [id number] DOB: 07-May-1928  Admit date:     01/08/2023  Discharge date: 01/10/23  Discharge Physician: Tyrone Nine   PCP: Philip Aspen, Limmie Patricia, MD   Recommendations at discharge:  Follow up with PCP in 1-2 weeks with repeat CMP. Patient admitted for DILI due to macrobid which has improved, macrobid added to allergy list. Once LFTs normalize, can restart statin.   Discharge Diagnoses: Principal Problem:   Abnormal LFTs  Hospital Course: Stacey Armstrong is a 89 y.o. female with a history of T2DM, HTN, HLD, postoperative DVT, OSA, stage IIIa CKD, GERD, and recently started on macrobid for UTI. She presented to the ED on 01/08/2023 with nausea, vomiting and chest discomfort, found to have elevated LFTs (AST 5,812, ALT 2,397, TBili 1.5). GI was consulted and patient was treated supportively for DILI due to macrobid. LFTs have improved, symptoms resolved. She is stable for discharge with plans to DC macrobid, added to allergy list, and hold statin until PCP follow up.  Assessment and Plan: Drug-induced liver injury: Prevailing suspicion. LFTs improving after holding macrobid. INR and mentation have remained normal. Hepatitis panel negative. Work up revealed patent vasculature, mild nodularity, hepatic steatosis.  - Macrobid added to allergy list.  - GI consulted, ok with discharge since symptoms improved, LFTs continuing abrupt improvement. Symptoms resolved.   Lactic acidemia: Resolved, partially indicative of effective hepatic function.    HTN:  - Continue home Tx.    Chest discomfort: Resolved. Troponin negative, ECG with nonspecific repolarization changes, no ST elevations, CXR with atelectasis. RVP negative, CT chest with R > L atelectasis.  - Continue incentive spirometry at home.    Shortness of breath: This, too has resolved. Suspect atelectasis is primary driver.  - Incentive spirometry.     Hx of UTI: No need to  substitute additional antimicrobial based on UA here (rare bacteria, no RBCs or WBCs, no urinary symptoms).    T2DM: Diet controlled with HbA1c 6.5% at goal for her age.    Hx of DVT post op: SCDs and mobilized frequently while inpatient. Pt has reported sensitivity to heparin.   GERD - PPI   OSA: does not require supplemental oxygen.   CKDIIIa: Stable.  - Avoid nephrotoxins.     HLD:  - Hold statin until PCP follow up.   Consultants: GI Procedures performed: None  Disposition: Home Diet recommendation: Heart healthy, carb-modified DISCHARGE MEDICATION: Allergies as of 01/10/2023       Reactions   Nitrofurantoin Other (See Comments)   Drug-induced liver injury Aug 2024   Penicillins Hives   Bactrim [sulfamethoxazole-trimethoprim] Nausea And Vomiting   Coconut Oil Hives, Swelling, Rash   Other Hives, Swelling, Rash   Berries         Medication List     TAKE these medications    Accu-Chek FastClix Lancets Misc 1 each by Other route daily.   albuterol 108 (90 Base) MCG/ACT inhaler Commonly known as: VENTOLIN HFA Inhale 1 puff into the lungs every 6 (six) hours as needed for wheezing.   amLODipine 10 MG tablet Commonly known as: NORVASC TAKE 1 TABLET EVERY DAY   aspirin EC 81 MG tablet Take 81 mg by mouth in the morning.   aspirin-acetaminophen-caffeine 250-250-65 MG tablet Commonly known as: EXCEDRIN MIGRAINE Take 1-2 tablets by mouth daily as needed for headache or migraine.   atorvastatin 20 MG tablet Commonly known as: LIPITOR TAKE 1 TABLET EVERY DAY   bisoprolol 5  MG tablet Commonly known as: ZEBETA TAKE 1 TABLET EVERY DAY   Fish Oil 1000 MG Caps Take 2,000 mg by mouth daily.   fluticasone 50 MCG/ACT nasal spray Commonly known as: FLONASE Place 2 sprays into both nostrils daily.   furosemide 20 MG tablet Commonly known as: Lasix Take 1 tablet (20 mg total) by mouth daily.   linaclotide 290 MCG Caps capsule Commonly known as: LINZESS Take  1 capsule (290 mcg total) by mouth daily before breakfast.   loratadine 10 MG tablet Commonly known as: CLARITIN Take 1 tablet (10 mg total) by mouth daily.   pantoprazole 40 MG tablet Commonly known as: PROTONIX Take 1 tablet (40 mg total) by mouth daily.   polyethylene glycol powder 17 GM/SCOOP powder Commonly known as: GLYCOLAX/MIRALAX Take 17 g by mouth 2 (two) times daily.   potassium chloride SA 20 MEQ tablet Commonly known as: Klor-Con M20 Take 1 tablet (20 mEq total) by mouth daily.   PRESERVISION AREDS 2 PO Take 1 tablet by mouth in the morning and at bedtime.   PROBIOTIC PO Take 1 capsule by mouth daily.   psyllium 95 % Pack Commonly known as: HYDROCIL/METAMUCIL Take 1 packet by mouth daily.   Systane Ultra 0.4-0.3 % Soln Generic drug: Polyethyl Glycol-Propyl Glycol Place 1-2 drops into both eyes 3 (three) times daily as needed (dry/irritated eyes.).   True Metrix Blood Glucose Test test strip Generic drug: glucose blood TEST BLOOD SUGAR EVERY DAY   True Metrix Meter Devi 1 each by Does not apply route daily.   Vitamin D3 50 MCG (2000 UT) Tabs Take 2,000 Units by mouth in the morning.        Follow-up Information     Philip Aspen, Limmie Patricia, MD Follow up.   Specialty: Internal Medicine Contact information: [address] [place] [address] [phone]                Discharge Exam: BP (!) 145/61 (BP Location: Right Arm)   Pulse 80   Temp 97.9 F (36.6 C)   Resp 16   SpO2 100%   Pleasant, HOH elderly female in no distress requesting DC home. Up in chair having ambulated independently without oxygen to bathroom and back.  Crackles at right base that clear with dep breaths, nonlabored RRR Soft, NT, ND, +BS Alert, oriented.  Condition at discharge: stable  The results of significant diagnostics from this hospitalization (including imaging, microbiology, ancillary and laboratory) are listed below for reference.    Imaging Studies: CT CHEST WO CONTRAST  Result Date: 01/08/2023 CLINICAL DATA:  Shortness of breath EXAM: CT CHEST WITHOUT CONTRAST TECHNIQUE: Multidetector CT imaging of the chest was performed following the standard protocol without IV contrast. RADIATION DOSE REDUCTION: This exam was performed according to the departmental dose-optimization program which includes automated exposure control, adjustment of the mA and/or kV according to patient size and/or use of iterative reconstruction technique. COMPARISON:  Chest radiograph dated 01/08/2023. CTA chest dated 03/08/2022. FINDINGS: Cardiovascular: Heart is top-normal in size. No pericardial effusion. Hypodense blood pool relative to myocardium, suggesting anemia. No evidence of thoracic aortic aneurysm. Atherosclerotic calcifications of the aortic arch. Severe three-vessel coronary atherosclerosis. Mediastinum/Nodes: No suspicious mediastinal lymphadenopathy. Visualized thyroid is unremarkable. Lungs/Pleura: 13 mm ground-glass nodule in the posterior right lung apex, similar to the prior. No new/suspicious pulmonary nodules. Mild centrilobular and paraseptal emphysematous changes, upper lung predominant. Mild bilateral lower lobe atelectasis, left greater than right. Associated trace bilateral pleural effusions. No pneumothorax. Upper  Abdomen: Visualized upper abdomen is notable for prior cholecystectomy and vascular calcifications. Musculoskeletal: Moderate degenerative changes of the visualized thoracolumbar spine. IMPRESSION: Mild bilateral lower lobe atelectasis, left greater than right. Associated trace bilateral pleural effusions. 13 mm ground-glass nodule in the posterior right lung apex, similar to the prior. In general, follow-up CT chest would be suggested in 2 years. However, given the patient's age, this is unlikely to be clinically significant, and follow-up is considered optional. Aortic Atherosclerosis (ICD10-I70.0) and Emphysema (ICD10-J43.9).  Electronically Signed   By: Charline Bills M.D.   On: 01/08/2023 20:53   [place] ABD LTD RUG W/LIVER DOPPLER  Result Date: 01/08/2023 CLINICAL DATA:  Elevated LFTs EXAM: DUPLEX ULTRASOUND OF LIVER TECHNIQUE: Color and duplex Doppler ultrasound was performed to evaluate the hepatic in-flow and out-flow vessels. COMPARISON:  None Available. FINDINGS: Liver: Increased parenchymal echogenicity. Mildly nodular hepatic contour without nodularity. No focal lesion, mass or intrahepatic biliary ductal dilatation. Portal vein size: 1.3 cm Portal Vein Velocities Main Prox:  24.2 cm/sec Main Mid: 29.5 cm/sec Main Dist:  27.1 cm/sec Right: 22.4 cm/sec Left: 20.5 cm/sec Hepatic Vein Velocities Right:  19.6 cm/sec Middle:  18.2 cm/sec Left:  12.3 cm/sec IVC: Present and patent with normal respiratory phasicity. Hepatic Artery Velocity:  89.8 cm/sec Splenic Vein Velocity:  22.6 cm/sec Spleen: 8.9 cm x 2.7 cm x 2.0 cm with a total volume of 25.7 cm^3 (411 cm^3 is upper limit normal) Portal Vein Occlusion/Thrombus: No Splenic Vein Occlusion/Thrombus: No Ascites: None Varices: None IMPRESSION: 1. Patent hepatic vasculature with normal flow directionality. 2. Hepatic steatosis. 3. Mildly nodular liver contour, suggestive of cirrhosis. Electronically Signed   By: Allegra Lai M.D.   On: 01/08/2023 15:08   US Abdomen Limited RUQ (LIVER/GB)  Result Date: 01/08/2023 CLINICAL DATA:  Abdominal pain EXAM: ULTRASOUND ABDOMEN LIMITED RIGHT UPPER QUADRANT COMPARISON:  None Available. FINDINGS: Gallbladder: Surgically removed Common bile duct: Diameter: 7.5 mm Liver: Diffusely increased in echogenicity. Mild nodularity is noted. No discrete mass is seen. Portal vein is patent on color Doppler imaging with normal direction of blood flow towards the liver. Other: None. IMPRESSION: Status post cholecystectomy. Mild nodularity to the liver which may represent some underlying cirrhosis. No discrete mass is seen. Electronically Signed   By:  Alcide Clever M.D.   On: 01/08/2023 03:19   DG Chest Portable 1 View  Result Date: 01/08/2023 CLINICAL DATA:  Chest pain, vomiting EXAM: PORTABLE CHEST 1 VIEW COMPARISON:  Chest radiograph dated 06/29/2022. CT chest dated 03/08/2022. FINDINGS: Mild bibasilar opacities, likely atelectasis. No frank interstitial edema. No pleural effusion or pneumothorax. Heart is top-normal in size.  Thoracic aortic atherosclerosis. IMPRESSION: Mild bibasilar opacities, likely atelectasis. Electronically Signed   By: Charline Bills M.D.   On: 01/08/2023 01:26    Microbiology: Results for orders placed or performed during the hospital encounter of 01/08/23  SARS Coronavirus 2 by RT PCR (hospital order, performed in [facility] hospital lab) *cepheid single result test* Anterior Nasal Swab     Status: None   Collection Time: 01/08/23  4:41 PM   Specimen: Anterior Nasal Swab  Result Value Ref Range Status   SARS Coronavirus 2 by RT PCR NEGATIVE NEGATIVE Final    Comment: Performed at [facility], [address] [place], [place] [address]  Respiratory (~20 pathogens) panel by PCR     Status: None   Collection Time: 01/08/23  4:41 PM   Specimen: Nasopharyngeal Swab; Respiratory  Result Value Ref Range Status  Adenovirus NOT DETECTED NOT DETECTED Final   Coronavirus 229E NOT DETECTED NOT DETECTED Final    Comment: (NOTE) The Coronavirus on the Respiratory Panel, DOES NOT test for the novel  Coronavirus (2019 nCoV)    Coronavirus HKU1 NOT DETECTED NOT DETECTED Final   Coronavirus NL63 NOT DETECTED NOT DETECTED Final   Coronavirus OC43 NOT DETECTED NOT DETECTED Final   Metapneumovirus NOT DETECTED NOT DETECTED Final   Rhinovirus / Enterovirus NOT DETECTED NOT DETECTED Final   Influenza A NOT DETECTED NOT DETECTED Final   Influenza B NOT DETECTED NOT DETECTED Final   Parainfluenza Virus 1 NOT DETECTED NOT DETECTED Final   Parainfluenza Virus 2 NOT DETECTED NOT DETECTED Final   Parainfluenza Virus 3  NOT DETECTED NOT DETECTED Final   Parainfluenza Virus 4 NOT DETECTED NOT DETECTED Final   Respiratory Syncytial Virus NOT DETECTED NOT DETECTED Final   Bordetella pertussis NOT DETECTED NOT DETECTED Final   Bordetella Parapertussis NOT DETECTED NOT DETECTED Final   Chlamydophila pneumoniae NOT DETECTED NOT DETECTED Final   Mycoplasma pneumoniae NOT DETECTED NOT DETECTED Final    Comment: Performed at [facility], [address] [place], [place] [address]    Labs: CBC: Recent Labs  Lab 01/08/23 0109 01/09/23 0645  WBC 7.1 6.6  NEUTROABS 6.1  --   HGB 12.0 10.1*  HCT 38.5 32.8*  MCV 83.7 83.9  PLT 255 218   Basic Metabolic Panel: Recent Labs  Lab 01/08/23 0109 01/09/23 0645 01/10/23 0102  NA 136 136 134*  K 4.0 4.5 4.8  CL 98 103 100  CO2 22 27 28   GLUCOSE 226* 132* 119*  BUN 27* 26* 31*  CREATININE 1.56* 1.56* 1.87*  CALCIUM 9.4 8.7* 8.6*   Liver Function Tests: Recent Labs  Lab 01/08/23 0109 01/08/23 0418 01/09/23 0645 01/10/23 0102  AST 3,779* 5,812* 1,078* 424*  ALT 1,495* 2,397* 1,178* 771*  ALKPHOS 208* 210* 201* 170*  BILITOT 1.1 1.5* 1.3* 0.7  PROT 6.4* 5.7* 5.5* 5.4*  ALBUMIN 3.3* 2.9* 2.5* 2.6*   CBG: Recent Labs  Lab 01/08/23 0117  GLUCAP 204*    Discharge time spent: greater than 30 minutes.  Signed: Tyrone Nine, MD [facility] 01/10/2023

## 2023-01-12 ENCOUNTER — Telehealth: Payer: Self-pay

## 2023-01-12 NOTE — Transitions of Care (Post Inpatient/ED Visit) (Signed)
01/12/2023  Name: Stacey Armstrong MRN: [id number] DOB: 25-Jun-1927  Today's TOC FU Call Status: Today's TOC FU Call Status:: Successful TOC FU Call Completed TOC FU Call Complete Date: 01/12/23  Transition Care Management Follow-up Telephone Call Date of Discharge: 01/10/23 Discharge Facility: [facility]) Type of Discharge: Inpatient Admission Primary Inpatient Discharge Diagnosis:: "acute hepatitis" How have you been since you were released from the hospital?: Better (pt states she is doing well-denies any acute issues or concerns) Any questions or concerns?: No  Items Reviewed: Did you receive and understand the discharge instructions provided?: Yes Medications obtained,verified, and reconciled?: Partial Review Completed Reason for Partial Mediation Review: pt HOH and having trouble hearing on phone-reviewed med changes-nd sx mgmt meds Any new allergies since your discharge?: No Dietary orders reviewed?: Yes Type of Diet Ordered:: low salt/heart healthy/carb modified Do you have support at home?: Yes People in Home: child(ren), adult Name of Support/Comfort Primary Source: Stacey Armstrong  Medications Reviewed Today: Medications Reviewed Today     Reviewed by Stacey Minerva, RN (Registered Nurse) on 01/12/23 at 1458  Med List Status: <None>   Medication Order Taking? Sig Documenting Provider Last Dose Status Informant  Accu-Chek FastClix Lancets MISC [id number]  1 each by Other route daily. Philip Aspen, Limmie Patricia, MD  Active Child  albuterol (VENTOLIN HFA) 108 (90 Base) MCG/ACT inhaler [id number]  Inhale 1 puff into the lungs every 6 (six) hours as needed for wheezing. [provider]  Active Child  amLODipine (NORVASC) 10 MG tablet [id number]  TAKE 1 TABLET EVERY DAY Philip Aspen, Limmie Patricia, MD  Active Child  aspirin EC 81 MG tablet [id number]  Take 81 mg by mouth in the morning. [provider]  Active Child  aspirin-acetaminophen-caffeine (EXCEDRIN  MIGRAINE) [phone] MG tablet [id number]  Take 1-2 tablets by mouth daily as needed for headache or migraine. [provider]  Active Child  atorvastatin (LIPITOR) 20 MG tablet [id number] No Take 1 tablet (20 mg total) by mouth daily. hold until PCP follow up  Patient not taking: Reported on 01/12/2023   Tyrone Nine, MD Not Taking Active   bisoprolol (ZEBETA) 5 MG tablet [id number]  TAKE 1 TABLET EVERY DAY Philip Aspen, Limmie Patricia, MD  Active Child  Blood Glucose Monitoring Suppl (TRUE METRIX METER) DEVI [id number]  1 each by Does not apply route daily. Philip Aspen, Limmie Patricia, MD  Active Child  Cholecalciferol (VITAMIN D3) 50 MCG (2000 UT) TABS [id number]  Take 2,000 Units by mouth in the morning. [provider]  Active Child  fluticasone (FLONASE) 50 MCG/ACT nasal spray [id number] Yes Place 2 sprays into both nostrils daily. Rai, Delene Ruffini, MD Taking Active Child  furosemide (LASIX) 20 MG tablet [id number]  Take 1 tablet (20 mg total) by mouth daily. Philip Aspen, Limmie Patricia, MD  Active Child  linaclotide [facility]) 290 MCG CAPS capsule [id number] Yes Take 1 capsule (290 mcg total) by mouth daily before breakfast. Philip Aspen, Limmie Patricia, MD Taking Active Child  loratadine (CLARITIN) 10 MG tablet [id number]  Take 1 tablet (10 mg total) by mouth daily. Philip Aspen, Limmie Patricia, MD  Active Child  Multiple Vitamins-Minerals (PRESERVISION AREDS 2 PO) [id number]  Take 1 tablet by mouth in the morning and at bedtime. [provider]  Active Child  Omega-3 Fatty Acids (FISH OIL) 1000 MG CAPS [id number]  Take 2,000 mg by mouth daily. [provider]  Active Child  pantoprazole (PROTONIX) 40 MG tablet [id number]  Take 1 tablet (40 mg  total) by mouth daily. Philip Aspen, Limmie Patricia, MD  Active Child  Polyethyl Glycol-Propyl Glycol (SYSTANE ULTRA) 0.4-0.3 % SOLN [id number]  Place 1-2 drops into both eyes 3 (three) times daily as needed (dry/irritated eyes.). [provider]  Active Child  polyethylene glycol powder (GLYCOLAX/MIRALAX) 17 GM/SCOOP powder [id number] Yes Take 17 g by mouth 2 (two) times daily. Rai, Delene Ruffini, MD Taking Active Child  potassium chloride SA (KLOR-CON M20) 20 MEQ tablet [id number]  Take 1 tablet (20 mEq total) by mouth daily. Philip Aspen, Limmie Patricia, MD  Active Child  Probiotic Product (PROBIOTIC PO) [id number] Yes Take 1 capsule by mouth daily. [provider] Taking Active Child  psyllium (HYDROCIL/METAMUCIL) 95 % PACK [id number] Yes Take 1 packet by mouth daily. Cathren Harsh, MD Taking Active Child  TRUE METRIX BLOOD GLUCOSE TEST test strip [id number]  TEST BLOOD SUGAR EVERY DAY Philip Aspen, Limmie Patricia, MD  Active Child            Home Care and Equipment/Supplies: Were Home Health Services Ordered?: NA Any new equipment or medical supplies ordered?: NA  Functional Questionnaire: Do you need assistance with bathing/showering or dressing?: Yes Do you need assistance with meal preparation?: Yes Do you need assistance with eating?: No Do you have difficulty maintaining continence: Yes Do you need assistance with getting out of bed/getting out of a chair/moving?: No Do you have difficulty managing or taking your medications?: No  Follow up appointments reviewed: PCP Follow-up appointment confirmed?: No (Pt reports that daughter makes all her appts for her around her schedule-not in the home at present-daughter will call office on her own) MD Provider Line Number:[phone] Given: No Specialist Hospital Follow-up appointment confirmed?: NA Do you need transportation to your follow-up appointment?: No (pt confirms daughter takes her to appts) Do you understand care options if your condition(s) worsen?: Yes-patient verbalized understanding   TOC Interventions Today    Flowsheet Row Most Recent Value  TOC Interventions   TOC Interventions Discussed/Reviewed TOC Interventions Discussed       Interventions Today    Flowsheet Row Most Recent Value  Chronic Disease   Chronic disease during today's visit Diabetes  General Interventions   General Interventions Discussed/Reviewed General Interventions Discussed, Doctor Visits  Doctor Visits Discussed/Reviewed Doctor Visits Discussed, PCP  PCP/Specialist Visits Compliance with follow-up visit  Education Interventions   Education Provided Provided Education  Provided Verbal Education On Nutrition, Other, Medication, When to see the doctor  [sx/bowel mgmt]  Nutrition Interventions   Nutrition Discussed/Reviewed Nutrition Discussed, Fluid intake, Increasing proteins, Decreasing fats, Decreasing salt  Pharmacy Interventions   Pharmacy Dicussed/Reviewed Pharmacy Topics Discussed, Medications and their functions  Safety Interventions   Safety Discussed/Reviewed Safety Discussed        Alessandra Grout [facility] Care Management Care Management Community Coordinator Direct Phone: [phone] Toll Free: [phone] Fax: [phone]

## 2023-01-18 ENCOUNTER — Other Ambulatory Visit: Payer: Self-pay | Admitting: Internal Medicine

## 2023-01-18 DIAGNOSIS — R600 Localized edema: Secondary | ICD-10-CM

## 2023-01-19 DIAGNOSIS — L720 Epidermal cyst: Secondary | ICD-10-CM | POA: Diagnosis not present

## 2023-01-19 DIAGNOSIS — D692 Other nonthrombocytopenic purpura: Secondary | ICD-10-CM | POA: Diagnosis not present

## 2023-01-19 DIAGNOSIS — L820 Inflamed seborrheic keratosis: Secondary | ICD-10-CM | POA: Diagnosis not present

## 2023-01-27 ENCOUNTER — Encounter: Payer: Self-pay | Admitting: Internal Medicine

## 2023-01-27 ENCOUNTER — Ambulatory Visit (INDEPENDENT_AMBULATORY_CARE_PROVIDER_SITE_OTHER): Payer: Medicare HMO | Admitting: Internal Medicine

## 2023-01-27 VITALS — BP 130/64 | HR 64 | Temp 98.2°F | Wt 159.4 lb

## 2023-01-27 DIAGNOSIS — Z09 Encounter for follow-up examination after completed treatment for conditions other than malignant neoplasm: Secondary | ICD-10-CM | POA: Diagnosis not present

## 2023-01-27 DIAGNOSIS — K719 Toxic liver disease, unspecified: Secondary | ICD-10-CM | POA: Diagnosis not present

## 2023-01-27 DIAGNOSIS — Z23 Encounter for immunization: Secondary | ICD-10-CM

## 2023-01-27 LAB — BASIC METABOLIC PANEL
BUN: 23 mg/dL (ref 6–23)
CO2: 27 meq/L (ref 19–32)
Calcium: 9.1 mg/dL (ref 8.4–10.5)
Chloride: 103 meq/L (ref 96–112)
Creatinine, Ser: 1.48 mg/dL — ABNORMAL HIGH (ref 0.40–1.20)
GFR: 30.45 mL/min — ABNORMAL LOW (ref >60.00)
Glucose, Bld: 109 mg/dL — ABNORMAL HIGH (ref 70–99)
Potassium: 4.5 meq/L (ref 3.5–5.1)
Sodium: 138 meq/L (ref 135–145)

## 2023-01-27 LAB — HEPATIC FUNCTION PANEL
ALT: 16 U/L (ref 0–35)
AST: 18 U/L (ref 0–37)
Albumin: 3.5 g/dL (ref 3.5–5.2)
Alkaline Phosphatase: 118 U/L — ABNORMAL HIGH (ref 39–117)
Bilirubin, Direct: 0.1 mg/dL (ref 0.0–0.3)
Total Bilirubin: 0.4 mg/dL (ref 0.2–1.2)
Total Protein: 6.5 g/dL (ref 6.0–8.3)

## 2023-01-27 NOTE — Progress Notes (Signed)
Established Patient Office Visit     CC/Reason for Visit: Hospital discharge follow-up  HPI: Stacey Armstrong is a 89 y.o. female who is coming in today for the above mentioned reasons. Past Medical History is significant for: Type 2 diabetes, hypertension hyperlipidemia, OSA, stage IIIa chronic kidney disease GERD.  She was started on Macrobid for presumed UTI and after a few days had severe nausea and vomiting.  Went to the ED and was found to have significant transaminitis, GI was consulted, this was thought to be drug-induced liver injury.  With hydration and antiemetics her numbers improved today atorvastatin has been placed on hold pending complete resolution of transaminitis.  Requesting flu vaccine.   Past Medical/Surgical History: Past Medical History:  Diagnosis Date   Arthritis    Bronchitis    Complication of anesthesia    after gallbladder surgery with extubation had shallow respirations,  needed to continue intudation for additonal 10-12 days   Diabetes (HCC)    DVT (deep venous thrombosis) (HCC)    left leg after hysterectomy   GERD (gastroesophageal reflux disease)    History of COVID-19 05/2021   History of kidney stones    History of transient ischemic attack (TIA)    HOH (hard of hearing)    Hyperlipidemia    Hypertension    Kidney stones 2023   Sleep apnea     Past Surgical History:  Procedure Laterality Date   ABDOMINAL HYSTERECTOMY     APPENDECTOMY     CATARACT EXTRACTION, BILATERAL     CHOLECYSTECTOMY     COLONOSCOPY     CYSTOSCOPY WITH STENT PLACEMENT Right 08/14/2021   Procedure: CYSTOSCOPY WITH STENT PLACEMENT;  Surgeon: Crist Fat, MD;  Location: [facility];  Service: Urology;  Laterality: Right;  ONLY NEEDS 30 MIN   EXTRACORPOREAL SHOCK WAVE LITHOTRIPSY Right 02/06/2022   Procedure: RIGHT EXTRACORPOREAL SHOCK WAVE LITHOTRIPSY (ESWL);  Surgeon: Crist Fat, MD;  Location: [facility];  Service: Urology;  Laterality:  Right;    Social History:  reports that she has quit smoking. She has never used smokeless tobacco. She reports that she does not currently use alcohol. She reports that she does not use drugs.  Allergies: Allergies  Allergen Reactions   Nitrofurantoin Other (See Comments)    Drug-induced liver injury Aug 2024   Penicillins Hives   Bactrim [Sulfamethoxazole-Trimethoprim] Nausea And Vomiting   Coconut Oil Hives, Swelling and Rash   Other Hives, Swelling and Rash    Berries     Family History:  Family History  Problem Relation Age of Onset   CAD Maternal Grandmother    Heart disease Maternal Grandmother    Pneumonia Maternal Grandfather    Liver disease Neg Hx    Colon cancer Neg Hx    Esophageal cancer Neg Hx      Current Outpatient Medications:    Accu-Chek FastClix Lancets MISC, 1 each by Other route daily., Disp: 102 each, Rfl: 3   albuterol (VENTOLIN HFA) 108 (90 Base) MCG/ACT inhaler, Inhale 1 puff into the lungs every 6 (six) hours as needed for wheezing., Disp: , Rfl:    amLODipine (NORVASC) 10 MG tablet, TAKE 1 TABLET EVERY DAY, Disp: 90 tablet, Rfl: 1   aspirin EC 81 MG tablet, Take 81 mg by mouth in the morning., Disp: , Rfl:    aspirin-acetaminophen-caffeine (EXCEDRIN MIGRAINE) 250-250-65 MG tablet, Take 1-2 tablets by mouth daily as needed for headache or migraine., Disp: , Rfl:  bisoprolol (ZEBETA) 5 MG tablet, TAKE 1 TABLET EVERY DAY, Disp: 90 tablet, Rfl: 1   Blood Glucose Monitoring Suppl (TRUE METRIX METER) DEVI, 1 each by Does not apply route daily., Disp: 1 each, Rfl: 1   Cholecalciferol (VITAMIN D3) 50 MCG (2000 UT) TABS, Take 2,000 Units by mouth in the morning., Disp: , Rfl:    fluticasone (FLONASE) 50 MCG/ACT nasal spray, Place 2 sprays into both nostrils daily., Disp: 16 g, Rfl: 2   furosemide (LASIX) 20 MG tablet, TAKE 1 TABLET BY MOUTH ON MONDAY, WEDNESDAY AND FRIDAY, Disp: 36 tablet, Rfl: 3   linaclotide (LINZESS) 290 MCG CAPS capsule, Take 1  capsule (290 mcg total) by mouth daily before breakfast., Disp: 90 capsule, Rfl: 1   loratadine (CLARITIN) 10 MG tablet, Take 1 tablet (10 mg total) by mouth daily., Disp: 90 tablet, Rfl: 1   Multiple Vitamins-Minerals (PRESERVISION AREDS 2 PO), Take 1 tablet by mouth in the morning and at bedtime., Disp: , Rfl:    Omega-3 Fatty Acids (FISH OIL) 1000 MG CAPS, Take 2,000 mg by mouth daily., Disp: , Rfl:    pantoprazole (PROTONIX) 40 MG tablet, Take 1 tablet (40 mg total) by mouth daily., Disp: 90 tablet, Rfl: 1   Polyethyl Glycol-Propyl Glycol (SYSTANE ULTRA) 0.4-0.3 % SOLN, Place 1-2 drops into both eyes 3 (three) times daily as needed (dry/irritated eyes.)., Disp: , Rfl:    polyethylene glycol powder (GLYCOLAX/MIRALAX) 17 GM/SCOOP powder, Take 17 g by mouth 2 (two) times daily., Disp: 3350 g, Rfl: 1   potassium chloride SA (KLOR-CON M20) 20 MEQ tablet, Take 1 tablet (20 mEq total) by mouth daily., Disp: 90 tablet, Rfl: 1   Probiotic Product (PROBIOTIC PO), Take 1 capsule by mouth daily., Disp: , Rfl:    psyllium (HYDROCIL/METAMUCIL) 95 % PACK, Take 1 packet by mouth daily., Disp: 240 each, Rfl: 3   TRUE METRIX BLOOD GLUCOSE TEST test strip, TEST BLOOD SUGAR EVERY DAY, Disp: 100 strip, Rfl: 10   atorvastatin (LIPITOR) 20 MG tablet, Take 1 tablet (20 mg total) by mouth daily. hold until PCP follow up (Patient not taking: Reported on 01/12/2023), Disp: , Rfl:   Review of Systems:  Negative unless indicated in HPI.   Physical Exam: Vitals:   01/27/23 1004  BP: 130/64  Pulse: 64  Temp: 98.2 F (36.8 C)  TempSrc: Oral  SpO2: 97%  Weight: 159 lb 6.4 oz (72.3 kg)    Body mass index is 27.36 kg/m.   Physical Exam Vitals reviewed.  Constitutional:      Appearance: Normal appearance.  HENT:     Head: Normocephalic and atraumatic.  Eyes:     Conjunctiva/sclera: Conjunctivae normal.     Pupils: Pupils are equal, round, and reactive to light.  Cardiovascular:     Rate and Rhythm: Normal  rate and regular rhythm.  Pulmonary:     Effort: Pulmonary effort is normal.     Breath sounds: Normal breath sounds.  Skin:    General: Skin is warm and dry.  Neurological:     General: No focal deficit present.     Mental Status: She is alert and oriented to person, place, and time.  Psychiatric:        Mood and Affect: Mood normal.        Behavior: Behavior normal.        Thought Content: Thought content normal.        Judgment: Judgment normal.      Impression and Plan:  Hospital discharge follow-up  Drug-induced liver injury -     Basic metabolic panel; Future -     Hepatic function panel; Future  Immunization due   [facility] charts reviewed in detail. -Recheck liver enzymes today.  Hold atorvastatin until labs can be reviewed. -Flu vaccine administered today.  Time spent:31 minutes reviewing chart, interviewing and examining patient and formulating plan of care.     Chaya Jan, MD [facility]

## 2023-01-27 NOTE — Addendum Note (Signed)
Addended by: Kern Reap B on: 01/27/2023 02:49 PM   Modules accepted: Orders

## 2023-01-30 ENCOUNTER — Encounter: Payer: Self-pay | Admitting: Internal Medicine

## 2023-01-30 ENCOUNTER — Other Ambulatory Visit: Payer: Self-pay | Admitting: Internal Medicine

## 2023-01-30 DIAGNOSIS — R600 Localized edema: Secondary | ICD-10-CM

## 2023-02-02 ENCOUNTER — Other Ambulatory Visit: Payer: Self-pay | Admitting: Internal Medicine

## 2023-02-02 DIAGNOSIS — K5641 Fecal impaction: Secondary | ICD-10-CM

## 2023-02-12 ENCOUNTER — Encounter: Payer: Self-pay | Admitting: Gastroenterology

## 2023-02-12 ENCOUNTER — Ambulatory Visit: Payer: Medicare HMO | Admitting: Gastroenterology

## 2023-02-12 ENCOUNTER — Other Ambulatory Visit: Payer: Self-pay | Admitting: Internal Medicine

## 2023-02-12 VITALS — BP 128/70 | HR 79 | Ht 62.0 in | Wt 157.8 lb

## 2023-02-12 DIAGNOSIS — I10 Essential (primary) hypertension: Secondary | ICD-10-CM

## 2023-02-12 DIAGNOSIS — R1310 Dysphagia, unspecified: Secondary | ICD-10-CM

## 2023-02-12 NOTE — Progress Notes (Signed)
Agree with assessment and plan as outlined.  

## 2023-02-12 NOTE — Patient Instructions (Signed)
Decrease Miralax to once daily.   Start Metamucil 2 teaspoons in 8 ounces of liquid daily.  Take Pantoprazole 40 mg daily 30-60 minutes before breakfast.    You have been scheduled for a Barium Esophogram at [facility] (1st floor of the hospital) on Friday 02/20/23 at 11 am. Please arrive 30 minutes prior to your appointment for registration. Make certain not to have anything to eat or drink 3 hours prior to your test. If you need to reschedule for any reason, please contact radiology at [phone] to do so. __________________________________________________________________ A barium swallow is an examination that concentrates on views of the esophagus. This tends to be a double contrast exam (barium and two liquids which, when combined, create a gas to distend the wall of the oesophagus) or single contrast (non-ionic iodine based). The study is usually tailored to your symptoms so a good history is essential. Attention is paid during the study to the form, structure and configuration of the esophagus, looking for functional disorders (such as aspiration, dysphagia, achalasia, motility and reflux) EXAMINATION You may be asked to change into a gown, depending on the type of swallow being performed. A radiologist and radiographer will perform the procedure. The radiologist will advise you of the type of contrast selected for your procedure and direct you during the exam. You will be asked to stand, sit or lie in several different positions and to hold a small amount of fluid in your mouth before being asked to swallow while the imaging is performed .In some instances you may be asked to swallow barium coated marshmallows to assess the motility of a solid food bolus. The exam can be recorded as a digital or video fluoroscopy procedure. POST PROCEDURE It will take 1-2 days for the barium to pass through your system. To facilitate this, it is important, unless otherwise directed, to increase  your fluids for the next 24-48hrs and to resume your normal diet.  This test typically takes about 30 minutes to perform. __________________________________________________________________________________

## 2023-02-12 NOTE — Progress Notes (Signed)
02/12/2023 Stacey Armstrong [id number] 07/17/1927   HISTORY OF PRESENT ILLNESS:  This is a 89 year old female who is a patient of Dr. Lanetta Inch.  She was seen by me back in February for complaints of constipation and fecal impactions.  She is currently on Linzess 290 mcg daily and MiraLAX twice daily, sometimes will take Metamucil as well to help with bulk, but does not always do that.  With this regimen she oftentimes has very loose bowel movements, she does not know when it will occur.  Is here today with her daughter also for complaints of dysphagia.  She says that over the past week specifically she has had a lot of issues with swallowing.  It has been much worse for the past week, but has noticed it in the past.  She says that when she swallows it feels like there is a ball going down and pushing through her esophageal wall.  She says that it hurts.  She says that after eating she has been having a lot of burping coming up but causes pressure.  She is on pantoprazole 40 mg daily.  Says that there is a lot of family history of different issues with swallowing.  Her weight is stable.  Past Medical History:  Diagnosis Date   Arthritis    Bronchitis    Complication of anesthesia    after gallbladder surgery with extubation had shallow respirations,  needed to continue intudation for additonal 10-12 days   Diabetes (HCC)    DVT (deep venous thrombosis) (HCC)    left leg after hysterectomy   GERD (gastroesophageal reflux disease)    History of COVID-19 05/2021   History of kidney stones    History of transient ischemic attack (TIA)    HOH (hard of hearing)    Hyperlipidemia    Hypertension    Kidney stones 2023   Sleep apnea    Past Surgical History:  Procedure Laterality Date   ABDOMINAL HYSTERECTOMY     APPENDECTOMY     CATARACT EXTRACTION, BILATERAL     CHOLECYSTECTOMY     COLONOSCOPY     CYSTOSCOPY WITH STENT PLACEMENT Right 08/14/2021   Procedure: CYSTOSCOPY WITH STENT  PLACEMENT;  Surgeon: Crist Fat, MD;  Location: [facility];  Service: Urology;  Laterality: Right;  ONLY NEEDS 30 MIN   EXTRACORPOREAL SHOCK WAVE LITHOTRIPSY Right 02/06/2022   Procedure: RIGHT EXTRACORPOREAL SHOCK WAVE LITHOTRIPSY (ESWL);  Surgeon: Crist Fat, MD;  Location: [facility];  Service: Urology;  Laterality: Right;    reports that she has quit smoking. She has never used smokeless tobacco. She reports that she does not currently use alcohol. She reports that she does not use drugs. family history includes CAD in her maternal grandmother; Heart disease in her maternal grandmother; Pneumonia in her maternal grandfather. Allergies  Allergen Reactions   Nitrofurantoin Other (See Comments)    Drug-induced liver injury Aug 2024   Penicillins Hives   Bactrim [Sulfamethoxazole-Trimethoprim] Nausea And Vomiting   Coconut Oil Hives, Swelling and Rash   Other Hives, Swelling and Rash    Berries       Outpatient Encounter Medications as of 02/12/2023  Medication Sig   Accu-Chek FastClix Lancets MISC 1 each by Other route daily.   albuterol (VENTOLIN HFA) 108 (90 Base) MCG/ACT inhaler Inhale 1 puff into the lungs every 6 (six) hours as needed for wheezing.   amLODipine (NORVASC) 10 MG tablet TAKE 1 TABLET EVERY DAY  aspirin EC 81 MG tablet Take 81 mg by mouth in the morning.   aspirin-acetaminophen-caffeine (EXCEDRIN MIGRAINE) 250-250-65 MG tablet Take 1-2 tablets by mouth daily as needed for headache or migraine.   bisoprolol (ZEBETA) 5 MG tablet TAKE 1 TABLET EVERY DAY   Blood Glucose Monitoring Suppl (TRUE METRIX METER) DEVI 1 each by Does not apply route daily.   Cholecalciferol (VITAMIN D3) 50 MCG (2000 UT) TABS Take 2,000 Units by mouth in the morning.   furosemide (LASIX) 20 MG tablet TAKE 1 TABLET EVERY DAY   LINZESS 290 MCG CAPS capsule TAKE 1 CAPSULE EVERY DAY BEFORE BREAKFAST   loratadine (CLARITIN) 10 MG tablet Take 1 tablet (10 mg total) by mouth  daily.   Omega-3 Fatty Acids (FISH OIL) 1000 MG CAPS Take 2,000 mg by mouth daily.   pantoprazole (PROTONIX) 40 MG tablet Take 1 tablet (40 mg total) by mouth daily.   Polyethyl Glycol-Propyl Glycol (SYSTANE ULTRA) 0.4-0.3 % SOLN Place 1-2 drops into both eyes 3 (three) times daily as needed (dry/irritated eyes.).   polyethylene glycol powder (GLYCOLAX/MIRALAX) 17 GM/SCOOP powder Take 17 g by mouth 2 (two) times daily.   potassium chloride SA (KLOR-CON M) 20 MEQ tablet TAKE 1 TABLET EVERY DAY   Probiotic Product (PROBIOTIC PO) Take 1 capsule by mouth daily.   psyllium (HYDROCIL/METAMUCIL) 95 % PACK Take 1 packet by mouth daily.   TRUE METRIX BLOOD GLUCOSE TEST test strip TEST BLOOD SUGAR EVERY DAY   atorvastatin (LIPITOR) 20 MG tablet Take 1 tablet (20 mg total) by mouth daily. hold until PCP follow up (Patient not taking: Reported on 01/12/2023)   fluticasone (FLONASE) 50 MCG/ACT nasal spray Place 2 sprays into both nostrils daily. (Patient not taking: Reported on 02/12/2023)   Multiple Vitamins-Minerals (PRESERVISION AREDS 2 PO) Take 1 tablet by mouth in the morning and at bedtime. (Patient not taking: Reported on 02/12/2023)   No facility-administered encounter medications on file as of 02/12/2023.    REVIEW OF SYSTEMS  : All other systems reviewed and negative except where noted in the History of Present Illness.   PHYSICAL EXAM: BP 128/70   Pulse 79   Ht 5\' 2"  (1.575 m)   Wt 157 lb 12.8 oz (71.6 kg)   SpO2 96%   BMI 28.86 kg/m  General: Well developed white female in no acute distress; uses a walker Head: Normocephalic and atraumatic Eyes:  Sclerae anicteric, conjunctiva pink. Ears: Normal auditory acuity Lungs: Clear throughout to auscultation; no W/R/R. Heart: Regular rate and rhythm; no M/R/G. Abdomen: Soft, non-distended.  BS present.  Non-tender. Musculoskeletal: Symmetrical with no gross deformities  Skin: No lesions on visible extremities Neurological: Alert oriented x 4,  grossly non-focal Psychological:  Alert and cooperative. Normal mood and affect  ASSESSMENT AND PLAN: *Chronic constipation and fecal impactions: Currently on Linzess 290 mcg daily and Miralax twice daily.  Also uses Metamucil to help with bulk, but does not use it regularly.  Stools loose with this regimen.  Can try to decrease the Miralax to once daily and see how she does and be sure that she is taking the Metamucil, 2 tsp in 6-8 ounces of liquid daily. *Dysphagia:  Suspect esophageal dysmotility.  Will check barium esophagram with tablet.  Continue pantoprazole 40 mg daily, should be taking this 30 to 60 minutes before first meal of the day.  CC:  Philip Aspen, [place]*

## 2023-02-20 ENCOUNTER — Ambulatory Visit (HOSPITAL_COMMUNITY): Payer: Medicare HMO

## 2023-02-23 DIAGNOSIS — H903 Sensorineural hearing loss, bilateral: Secondary | ICD-10-CM | POA: Diagnosis not present

## 2023-03-02 DIAGNOSIS — N3941 Urge incontinence: Secondary | ICD-10-CM | POA: Diagnosis not present

## 2023-03-05 DIAGNOSIS — K219 Gastro-esophageal reflux disease without esophagitis: Secondary | ICD-10-CM | POA: Diagnosis not present

## 2023-03-05 DIAGNOSIS — R1310 Dysphagia, unspecified: Secondary | ICD-10-CM | POA: Diagnosis not present

## 2023-03-13 ENCOUNTER — Telehealth: Payer: Self-pay

## 2023-03-13 ENCOUNTER — Ambulatory Visit (HOSPITAL_COMMUNITY)
Admission: RE | Admit: 2023-03-13 | Discharge: 2023-03-13 | Disposition: A | Discharge status: Home / Self Care | Payer: Medicare HMO | Source: Ambulatory Visit | Attending: Gastroenterology | Admitting: Gastroenterology

## 2023-03-13 DIAGNOSIS — K225 Diverticulum of esophagus, acquired: Secondary | ICD-10-CM | POA: Diagnosis not present

## 2023-03-13 DIAGNOSIS — R1310 Dysphagia, unspecified: Secondary | ICD-10-CM | POA: Insufficient documentation

## 2023-03-13 NOTE — Telephone Encounter (Signed)
Diane with [facility] called & wanted Shanda Bumps, [place] to be aware of barium swallow report that has resulted from today.

## 2023-03-16 ENCOUNTER — Other Ambulatory Visit (HOSPITAL_COMMUNITY): Payer: Medicare HMO

## 2023-03-16 NOTE — Telephone Encounter (Signed)
See results note from 10/21

## 2023-03-16 NOTE — Telephone Encounter (Signed)
Patient's daughter is returning your call 

## 2023-03-17 NOTE — Telephone Encounter (Signed)
Patients daughter requesting to speak with nurse, please advise.  [phone]

## 2023-03-17 NOTE — Telephone Encounter (Signed)
See alternate results note dated 10/22

## 2023-04-02 NOTE — Telephone Encounter (Signed)
Inbound call from patient daughter requesting to speak with a nurse in regards to previous note. Please advise.

## 2023-04-02 NOTE — Telephone Encounter (Signed)
Left message on machine to call back  

## 2023-04-03 NOTE — Telephone Encounter (Signed)
Left message on machine to call back  

## 2023-04-03 NOTE — Telephone Encounter (Signed)
Spoke with the pt's daughter [name] regarding the pt swallowing.  She would like an appt to discuss the results of the esophagram.  She states that her mother continues to have issues with swallowing and is not happy that she was told nothing really can be done other than swallowing precautions.  I offered to make an appt with Shanda Bumps or Dr Adela Lank but the daughter was not happy with the timing of the available spots.  She states she will be finding another GI practice.  She then thanked me and hung up the phone.

## 2023-04-03 NOTE — Telephone Encounter (Signed)
    Loretha Stapler, RN 03/17/2023  3:41 PM EDT     The pt daughter has been advised of the results and is understandably frustrated but she will follow the recommendations and call back if needed.     Loretha Stapler, RN 03/17/2023 12:50 PM EDT     The pt has asked that I call Michaelle Copas her Daughter at [phone] after 1 pm   Loretha Stapler, RN 03/16/2023 10:53 AM EDT     Left message on machine to call back   Leta Baptist, PA-C 03/15/2023  3:19 PM EDT     Please let her or her family know that her esophagram showed moderate esophageal dysmotility as was my suspicion when I saw her in the office.  This occurs as patient get older and the muscle of the esophagus does not contract in coordination.  She also has a small diverticulum or outpouching in the esophagus that is related to the motility problem.  This caused the barium tablet to become lodged in that area.  Unfortunately she probably could get some food stuck there as well, but also unfortunately there is nothing that we would do about either of these issues.  She needs to take her time with eating, chew food well, avoid foods that are more difficult for her to swallow, etc.

## 2023-04-03 NOTE — Telephone Encounter (Signed)
Stacey Armstrong see the message below

## 2023-04-16 ENCOUNTER — Ambulatory Visit (INDEPENDENT_AMBULATORY_CARE_PROVIDER_SITE_OTHER): Payer: Medicare HMO | Admitting: Internal Medicine

## 2023-04-16 ENCOUNTER — Encounter: Payer: Self-pay | Admitting: Internal Medicine

## 2023-04-16 VITALS — BP 110/68 | HR 55 | Temp 98.7°F | Wt 155.8 lb

## 2023-04-16 DIAGNOSIS — R1319 Other dysphagia: Secondary | ICD-10-CM

## 2023-04-16 DIAGNOSIS — K5909 Other constipation: Secondary | ICD-10-CM | POA: Diagnosis not present

## 2023-04-16 NOTE — Progress Notes (Signed)
Established Patient Office Visit     CC/Reason for Visit: F/u dysphagia  HPI: Stacey Armstrong is a 89 y.o. female who is coming in today for the above mentioned reasons.  Dysphagia has been present for months.  She saw GI who performed an esophagram that showed:  IMPRESSION: 1. Pulsion esophageal diverticulum at the level the carina. barium tablet became lodged at the level of the diverticulum. 2. Esophageal dysmotility consistent with moderate presbyesophagus. 3. Luminal diameter of the distal esophagus could not be assessed as barium tablet lodged in the midesophagus.  Per daughter who is here today.  She was told that there was nothing to do and she has become frustrated with that answer as the patient is having more trouble swallowing, is having a hard time drinking even liquids and now pills are being stuck in the upper portion of her chest.  She feels like the only time she can eat is in the evening after pantoprazole and is wondering if she can take this medication in the morning as well.   Past Medical/Surgical History: Past Medical History:  Diagnosis Date   Arthritis    Bronchitis    Complication of anesthesia    after gallbladder surgery with extubation had shallow respirations,  needed to continue intudation for additonal 10-12 days   Diabetes (HCC)    DVT (deep venous thrombosis) (HCC)    left leg after hysterectomy   GERD (gastroesophageal reflux disease)    History of COVID-19 05/2021   History of kidney stones    History of transient ischemic attack (TIA)    HOH (hard of hearing)    Hyperlipidemia    Hypertension    Kidney stones 2023   Sleep apnea     Past Surgical History:  Procedure Laterality Date   ABDOMINAL HYSTERECTOMY     APPENDECTOMY     CATARACT EXTRACTION, BILATERAL     CHOLECYSTECTOMY     COLONOSCOPY     CYSTOSCOPY WITH STENT PLACEMENT Right 08/14/2021   Procedure: CYSTOSCOPY WITH STENT PLACEMENT;  Surgeon: Crist Fat, MD;   Location: [facility];  Service: Urology;  Laterality: Right;  ONLY NEEDS 30 MIN   EXTRACORPOREAL SHOCK WAVE LITHOTRIPSY Right 02/06/2022   Procedure: RIGHT EXTRACORPOREAL SHOCK WAVE LITHOTRIPSY (ESWL);  Surgeon: Crist Fat, MD;  Location: [facility];  Service: Urology;  Laterality: Right;    Social History:  reports that she has quit smoking. She has never used smokeless tobacco. She reports that she does not currently use alcohol. She reports that she does not use drugs.  Allergies: Allergies  Allergen Reactions   Nitrofurantoin Other (See Comments)    Drug-induced liver injury Aug 2024   Penicillins Hives   Bactrim [Sulfamethoxazole-Trimethoprim] Nausea And Vomiting   Coconut Oil Hives, Swelling and Rash   Other Hives, Swelling and Rash    Berries     Family History:  Family History  Problem Relation Age of Onset   CAD Maternal Grandmother    Heart disease Maternal Grandmother    Pneumonia Maternal Grandfather    Liver disease Neg Hx    Colon cancer Neg Hx    Esophageal cancer Neg Hx      Current Outpatient Medications:    Accu-Chek FastClix Lancets MISC, 1 each by Other route daily., Disp: 102 each, Rfl: 3   albuterol (VENTOLIN HFA) 108 (90 Base) MCG/ACT inhaler, Inhale 1 puff into the lungs every 6 (six) hours as needed for wheezing., Disp: ,  Rfl:    amLODipine (NORVASC) 10 MG tablet, TAKE 1 TABLET EVERY DAY, Disp: 90 tablet, Rfl: 1   aspirin EC 81 MG tablet, Take 81 mg by mouth in the morning., Disp: , Rfl:    aspirin-acetaminophen-caffeine (EXCEDRIN MIGRAINE) 250-250-65 MG tablet, Take 1-2 tablets by mouth daily as needed for headache or migraine., Disp: , Rfl:    atorvastatin (LIPITOR) 20 MG tablet, Take 1 tablet (20 mg total) by mouth daily. hold until PCP follow up (Patient not taking: Reported on 01/12/2023), Disp: , Rfl:    bisoprolol (ZEBETA) 5 MG tablet, TAKE 1 TABLET EVERY DAY, Disp: 90 tablet, Rfl: 1   Blood Glucose Monitoring Suppl (TRUE  METRIX METER) DEVI, 1 each by Does not apply route daily., Disp: 1 each, Rfl: 1   Cholecalciferol (VITAMIN D3) 50 MCG (2000 UT) TABS, Take 2,000 Units by mouth in the morning., Disp: , Rfl:    fluticasone (FLONASE) 50 MCG/ACT nasal spray, Place 2 sprays into both nostrils daily. (Patient not taking: Reported on 02/12/2023), Disp: 16 g, Rfl: 2   furosemide (LASIX) 20 MG tablet, TAKE 1 TABLET EVERY DAY, Disp: 90 tablet, Rfl: 1   LINZESS 290 MCG CAPS capsule, TAKE 1 CAPSULE EVERY DAY BEFORE BREAKFAST, Disp: 90 capsule, Rfl: 3   loratadine (CLARITIN) 10 MG tablet, Take 1 tablet (10 mg total) by mouth daily., Disp: 90 tablet, Rfl: 1   Multiple Vitamins-Minerals (PRESERVISION AREDS 2 PO), Take 1 tablet by mouth in the morning and at bedtime. (Patient not taking: Reported on 02/12/2023), Disp: , Rfl:    Omega-3 Fatty Acids (FISH OIL) 1000 MG CAPS, Take 2,000 mg by mouth daily., Disp: , Rfl:    pantoprazole (PROTONIX) 40 MG tablet, Take 1 tablet (40 mg total) by mouth daily., Disp: 90 tablet, Rfl: 1   Polyethyl Glycol-Propyl Glycol (SYSTANE ULTRA) 0.4-0.3 % SOLN, Place 1-2 drops into both eyes 3 (three) times daily as needed (dry/irritated eyes.)., Disp: , Rfl:    polyethylene glycol powder (GLYCOLAX/MIRALAX) 17 GM/SCOOP powder, Take 17 g by mouth 2 (two) times daily., Disp: 3350 g, Rfl: 1   potassium chloride SA (KLOR-CON M) 20 MEQ tablet, TAKE 1 TABLET EVERY DAY, Disp: 90 tablet, Rfl: 3   Probiotic Product (PROBIOTIC PO), Take 1 capsule by mouth daily., Disp: , Rfl:    psyllium (HYDROCIL/METAMUCIL) 95 % PACK, Take 1 packet by mouth daily., Disp: 240 each, Rfl: 3   TRUE METRIX BLOOD GLUCOSE TEST test strip, TEST BLOOD SUGAR EVERY DAY, Disp: 100 strip, Rfl: 10  Review of Systems:  Negative unless indicated in HPI.   Physical Exam: Vitals:   04/16/23 1303  BP: 110/68  Pulse: (!) 55  Temp: 98.7 F (37.1 C)  TempSrc: Oral  SpO2: 97%  Weight: 155 lb 12.8 oz (70.7 kg)    Body mass index is 28.5  kg/m.   Physical Exam Vitals reviewed.  Constitutional:      Appearance: Normal appearance.  HENT:     Head: Normocephalic and atraumatic.  Eyes:     Conjunctiva/sclera: Conjunctivae normal.     Pupils: Pupils are equal, round, and reactive to light.  Skin:    General: Skin is warm and dry.  Neurological:     General: No focal deficit present.     Mental Status: She is alert and oriented to person, place, and time.  Psychiatric:        Mood and Affect: Mood normal.        Behavior: Behavior normal.  Thought Content: Thought content normal.        Judgment: Judgment normal.      Impression and Plan:  Esophageal dysphagia  Chronic constipation   -ok to increase PPI to BID since she feels she derives benefit from it. Wonder if pills getting stuck in upper chest area have anything to do with diverticulum seen on esophagram. -Sent a message to GI to see if they can facilitate a sooner appointment as she is having issues maintaining nutrition. We discussed protein drinks and meal replacement shakes in the interim as an alternative.  Time spent:33 minutes reviewing chart, interviewing and examining patient and formulating plan of care.     Chaya Jan, MD [facility] at [facility]

## 2023-04-20 ENCOUNTER — Telehealth: Payer: Self-pay

## 2023-04-20 NOTE — Telephone Encounter (Signed)
-----   Message from [place] D. Zehr sent at 04/16/2023  2:04 PM EST ----- Thank you for reaching out.  Her pills getting stuck could definitely be from the diverticulum.  That is not something that we repair.  Typically Zenker's diverticulum are managed by ENT, but this is obviously lower.  Otherwise we can consider putting her on for an EGD with empiric dilation of her esophagus to see if that helps although definitely not guaranteed and she is at increased risk due to her age, etc.  As long as she and her daughter understand that risk and that there is no guarantee to improvement we could put her on for procedure with Dr. Adela Lank without returning to the office since she was seen recently.  Thank you,  Shanda Bumps ----- Message ----- From: Philip Aspen, Limmie Patricia, MD Sent: 04/16/2023   1:48 PM EST To: Princella Pellegrini Zehr, PA-C  Hello,  I just saw Ms Stacey Armstrong today. She is having a really hard time swallowing. Can barely tolerate liquids and pills are becoming difficult. I saw the results of the esophagram you had ordered. Apparently the daughter called back for an appointment and was given a slot for April, which she didn't take. I honestly don't think she can wait that long. Anyway she can be fitted in the schedule sooner? She feels like pantoprazole helps and asked about trying it BID to which I said sure. Not sure if anything to do therapeutically, but she is struggling. Any help would be appreciated.  Peggye Pitt

## 2023-04-20 NOTE — Telephone Encounter (Signed)
Is this pt ok to be done in the [facility]?

## 2023-04-20 NOTE — Telephone Encounter (Signed)
Zehr, Princella Pellegrini, PA-C  Philip Aspen, Limmie Patricia, MD Cc: Loretha Stapler, RN Yes, our nursing staff can schedule it and relay this information to the daughter as well.  Delante Karapetyan,  Please schedule EGD with dilation with Dr. Adela Lank.

## 2023-04-22 NOTE — Telephone Encounter (Signed)
Lvm for patient to call back to schedule.

## 2023-04-22 NOTE — Telephone Encounter (Signed)
Can one of you please make appt for pt in [facility] with Dr Adela Lank for EGD and previsit? Thank you

## 2023-04-26 ENCOUNTER — Other Ambulatory Visit: Payer: Self-pay | Admitting: Internal Medicine

## 2023-04-29 ENCOUNTER — Ambulatory Visit: Payer: Medicare HMO | Admitting: Nurse Practitioner

## 2023-05-01 ENCOUNTER — Encounter: Payer: Self-pay | Admitting: Gastroenterology

## 2023-05-05 ENCOUNTER — Other Ambulatory Visit: Payer: Self-pay | Admitting: Internal Medicine

## 2023-05-13 DIAGNOSIS — D225 Melanocytic nevi of trunk: Secondary | ICD-10-CM | POA: Diagnosis not present

## 2023-05-13 DIAGNOSIS — L728 Other follicular cysts of the skin and subcutaneous tissue: Secondary | ICD-10-CM | POA: Diagnosis not present

## 2023-05-13 DIAGNOSIS — L821 Other seborrheic keratosis: Secondary | ICD-10-CM | POA: Diagnosis not present

## 2023-05-13 DIAGNOSIS — L814 Other melanin hyperpigmentation: Secondary | ICD-10-CM | POA: Diagnosis not present

## 2023-05-16 ENCOUNTER — Other Ambulatory Visit: Payer: Self-pay | Admitting: Internal Medicine

## 2023-05-19 ENCOUNTER — Ambulatory Visit (AMBULATORY_SURGERY_CENTER): Payer: Medicare HMO

## 2023-05-19 ENCOUNTER — Telehealth: Payer: Self-pay

## 2023-05-19 VITALS — Ht 62.0 in | Wt 153.4 lb

## 2023-05-19 DIAGNOSIS — R1310 Dysphagia, unspecified: Secondary | ICD-10-CM

## 2023-05-19 NOTE — Telephone Encounter (Signed)
Copied from CRM [id number]. Topic: General - Other >> May 19, 2023 11:46 AM Donita Brooks wrote: Reason for CRM: Pt weight has change 151 to 153 and her weight has been down and up. Daughter mention that Dr. Ardyth Harps told pt to let her know if that happens. Daughter doesn't know what Dr. Ardyth Harps would for her do moving forward [name] daughter call back number

## 2023-05-19 NOTE — Progress Notes (Signed)
No egg or soy allergy known to patient  No issues known to pt with past sedation with any surgeries or procedures Patient denies ever being told they had issues or difficulty with intubation  No FH of Malignant Hyperthermia Pt is not on diet pills Pt is not on  home 02  OSA  Pt is not on blood thinners  Pt reports constipation using miralax and fiber daily.  No A fib or A flutter Have any cardiac testing pending-- no  LOA: independent    Patient's chart reviewed by Cathlyn Parsons CNRA prior to previsit and patient appropriate for the [facility].  Previsit completed and red dot placed by patient's name on their procedure day (on provider's schedule).     PV competed with patient and her daughter. Prep instructions sent via mychart and home address.

## 2023-06-08 ENCOUNTER — Encounter: Payer: Self-pay | Admitting: Gastroenterology

## 2023-06-08 ENCOUNTER — Ambulatory Visit: Payer: PPO | Admitting: Gastroenterology

## 2023-06-08 VITALS — BP 144/56 | HR 52 | Temp 97.3°F | Resp 19 | Ht 62.0 in | Wt 153.0 lb

## 2023-06-08 DIAGNOSIS — G4730 Sleep apnea, unspecified: Secondary | ICD-10-CM | POA: Diagnosis not present

## 2023-06-08 DIAGNOSIS — Q399 Congenital malformation of esophagus, unspecified: Secondary | ICD-10-CM | POA: Diagnosis not present

## 2023-06-08 DIAGNOSIS — R1310 Dysphagia, unspecified: Secondary | ICD-10-CM

## 2023-06-08 DIAGNOSIS — T18128A Food in esophagus causing other injury, initial encounter: Secondary | ICD-10-CM

## 2023-06-08 DIAGNOSIS — E119 Type 2 diabetes mellitus without complications: Secondary | ICD-10-CM | POA: Diagnosis not present

## 2023-06-08 DIAGNOSIS — K449 Diaphragmatic hernia without obstruction or gangrene: Secondary | ICD-10-CM

## 2023-06-08 DIAGNOSIS — R933 Abnormal findings on diagnostic imaging of other parts of digestive tract: Secondary | ICD-10-CM | POA: Diagnosis not present

## 2023-06-08 DIAGNOSIS — D175 Benign lipomatous neoplasm of intra-abdominal organs: Secondary | ICD-10-CM | POA: Diagnosis not present

## 2023-06-08 DIAGNOSIS — J449 Chronic obstructive pulmonary disease, unspecified: Secondary | ICD-10-CM | POA: Diagnosis not present

## 2023-06-08 DIAGNOSIS — Q396 Congenital diverticulum of esophagus: Secondary | ICD-10-CM

## 2023-06-08 DIAGNOSIS — E785 Hyperlipidemia, unspecified: Secondary | ICD-10-CM | POA: Diagnosis not present

## 2023-06-08 DIAGNOSIS — I10 Essential (primary) hypertension: Secondary | ICD-10-CM | POA: Diagnosis not present

## 2023-06-08 MED ORDER — SODIUM CHLORIDE 0.9 % IV SOLN: 500.0000 mL | Freq: Once | INTRAVENOUS | Status: DC

## 2023-06-08 NOTE — Op Note (Signed)
 [facility] Patient Name: Stacey Armstrong Procedure Date: 06/08/2023 3:45 PM MRN: [id number] Endoscopist: Stacey P. Leigh , MD, [id number] Age: 89 Referring MD:  Date of Birth: 12-09-1927 Gender: Female Account #: [id number] Procedure:                Upper GI endoscopy Indications:              Dysphagia, Abnormal esophagram - diverticulum in                            mid esophagus where barium tablet lodged,                            dysmotility noted. EGD to further evaluate Medicines:                Monitored Anesthesia Care Procedure:                Pre-Anesthesia Assessment:                           - Prior to the procedure, a History and Physical                            was performed, and patient medications and                            allergies were reviewed. The patient's tolerance of                            previous anesthesia was also reviewed. The risks                            and benefits of the procedure and the sedation                            options and risks were discussed with the patient.                            All questions were answered, and informed consent                            was obtained. Prior Anticoagulants: The patient has                            taken no anticoagulant or antiplatelet agents. ASA                            Grade Assessment: III - A patient with severe                            systemic disease. After reviewing the risks and                            benefits, the patient was deemed in satisfactory  condition to undergo the procedure.                           After obtaining informed consent, the endoscope was                            passed under direct vision. Throughout the                            procedure, the patient's blood pressure, pulse, and                            oxygen saturations were monitored continuously. The                            GIF [id number]  #[id number] was introduced through the                            mouth, and advanced to the second part of duodenum.                            The upper GI endoscopy was accomplished without                            difficulty. The patient tolerated the procedure                            well. Scope In: Scope Out: Findings:                 Food was found in the middle third of the esophagus                            at the level of a diverticulum. The food in the                            lumen was pushed into the stomach. The esophagus                            was patent after this intervention.                           A diverticulum with a large opening was found in                            the middle third of the esophagus. It was impacted                            with old food contents.                           The examined esophagus was tortuous, especially  distal esophagus which was extremely tortous. No                            focal stenosis / stricture appreciated.                           The Z-line was regular.                           A small hiatal hernia was present.                           There was a lipoma at the pylorus that was                            nonobstructing.                           The exam of the stomach was otherwise normal on                            brief exam (see below). No pathology noted in the                            cardia.                           There was a lipoma in the second portion of the                            duodenum.                           The exam of the duodenum was otherwise normal. Complications:            Estimated blood loss: None. At the end of the                            procedure the patient developed oxygen desaturation                            managed per anesthesia, required amboo bag for a                            few minutes, she responded well to the                             intervention. No empiric dilation performed given                            this occurance. Estimated Blood Loss:     Estimated blood loss: none. Impression:               - Food in the middle third of the esophagus at  level of a diverticulum in the middle third of the                            esophagus. Luminal food was cleared, the                            diverticulum is impacted with old food debris.                           - Tortuous esophagus.                           - Z-line regular.                           - Hiatal hernia.                           - Gastric lipoma.                           - Duodenal lipoma.                           Overall, patient's dysphagia is due to diverticulum                            and dysmotility - no obvious stricture. She did not                            tolerate anesthesia well with transient hypoxia. No                            empiric dilation performed.                           Difficult situation to treat this. I think she                            would do best on a liquid diet indefinitely and                            consider feeding tube (PEG) if nutrition becomes a                            problem. If they wish to pursue aggressive therapy                            or intervention on the diverticulum would recommend                            tertiary care center evaluation. Recommendation:           - Patient has a contact number available for  emergencies. The signs and symptoms of potential                            delayed complications were discussed with the                            patient. Return to normal activities tomorrow.                            Written discharge instructions were provided to the                            patient.                           - Full liquid diet.                           - Continue  present medications.                           GLENWOOD Simmering discuss options with the patient and her                            family. Stacey P. Stacey Schroeter, MD 06/08/2023 4:14:25 PM This report has been signed electronically.

## 2023-06-08 NOTE — Progress Notes (Signed)
 [facility] History and Physical   Primary Care Physician:  Theophilus Andrews, Tully GRADE, MD   Reason for Procedure:   Dysphagia, abnormal barium swallow  Plan:    EGD with possible dilation     HPI: Stacey Armstrong is a 89 y.o. female  here for EGD to evaluate dysphagia. Abnormal barium swallow showing dysmotility and esophageal diverticulum. On protonix , persistent symptoms. Ongoing dysphagia and she also has regurgitation.   I discussed barium study findings with her. She wants to proceed with EGD to further evaluate. Discussed risks / benefits she understands. Unclear if dilation will help her based on findings but can consider it empirically, vs. having evaluation to have the diverticulum treated depending on how aggressive she wants to be with this.   Otherwise feels well without any cardiopulmonary symptoms. She wishes to proceed.   I have discussed risks / benefits of anesthesia and endoscopic procedure with Stacey Armstrong and they wish to proceed with the exams as outlined today.    Past Medical History:  Diagnosis Date   Arthritis    Bronchitis    Complication of anesthesia    after gallbladder surgery with extubation had shallow respirations,  needed to continue intudation for additonal 10-12 days   Diabetes (HCC)    DVT (deep venous thrombosis) (HCC)    left leg after hysterectomy   GERD (gastroesophageal reflux disease)    History of COVID-19 05/2021   History of kidney stones    History of transient ischemic attack (TIA)    HOH (hard of hearing)    Hyperlipidemia    Hypertension    Kidney stones 2023   Sleep apnea     Past Surgical History:  Procedure Laterality Date   ABDOMINAL HYSTERECTOMY     APPENDECTOMY     CATARACT EXTRACTION, BILATERAL     CHOLECYSTECTOMY     COLONOSCOPY     CYSTOSCOPY WITH STENT PLACEMENT Right 08/14/2021   Procedure: CYSTOSCOPY WITH STENT PLACEMENT;  Surgeon: Cam Morene ORN, MD;  Location: [facility];  Service:  Urology;  Laterality: Right;  ONLY NEEDS 30 MIN   EXTRACORPOREAL SHOCK WAVE LITHOTRIPSY Right 02/06/2022   Procedure: RIGHT EXTRACORPOREAL SHOCK WAVE LITHOTRIPSY (ESWL);  Surgeon: Cam Morene ORN, MD;  Location: [facility];  Service: Urology;  Laterality: Right;    Prior to Admission medications   Medication Sig Start Date End Date Taking? Authorizing Provider  amLODipine  (NORVASC ) 10 MG tablet TAKE 1 TABLET EVERY DAY 04/27/23  Yes Theophilus Andrews, Tully GRADE, MD  aspirin  EC 81 MG tablet Take 81 mg by mouth in the morning.   Yes [provider]  aspirin -acetaminophen -caffeine  (EXCEDRIN  MIGRAINE) 250-250-65 MG tablet Take 1-2 tablets by mouth daily as needed for headache or migraine.   Yes [provider]  atorvastatin  (LIPITOR) 20 MG tablet Take 1 tablet (20 mg total) by mouth daily. hold until PCP follow up 01/10/23  Yes Bryn Bernardino NOVAK, MD  bisoprolol  (ZEBETA ) 5 MG tablet TAKE 1 TABLET EVERY DAY 02/12/23  Yes Theophilus Andrews, Tully GRADE, MD  Cholecalciferol  (VITAMIN D3) 50 MCG (2000 UT) TABS Take 2,000 Units by mouth in the morning.   Yes [provider]  furosemide  (LASIX ) 20 MG tablet TAKE 1 TABLET EVERY DAY 02/02/23  Yes Theophilus Andrews, Tully GRADE, MD  ipratropium (ATROVENT ) 0.03 % nasal spray Place 2 sprays into both nostrils 3 (three) times daily. 05/18/23  Yes [provider]  LINZESS  290 MCG CAPS capsule TAKE 1 CAPSULE EVERY DAY BEFORE  BREAKFAST 02/03/23  Yes Theophilus Andrews, Tully GRADE, MD  loratadine  (CLARITIN ) 10 MG tablet Take 1 tablet (10 mg total) by mouth daily. 12/10/22  Yes Theophilus Andrews, Tully GRADE, MD  Multiple Vitamins-Minerals (PRESERVISION AREDS 2 PO) Take 1 tablet by mouth in the morning and at bedtime.   Yes [provider]  Omega-3 Fatty Acids (FISH OIL) 1000 MG CAPS Take 2,000 mg by mouth daily.   Yes [provider]  pantoprazole  (PROTONIX ) 40 MG tablet TAKE 1 TABLET BY MOUTH EVERY DAY 05/05/23  Yes Theophilus Andrews, Tully GRADE, MD  Polyethyl Glycol-Propyl Glycol (SYSTANE ULTRA) 0.4-0.3 % SOLN Place 1-2 drops into both eyes 3 (three) times daily as needed (dry/irritated eyes.).   Yes [provider]  potassium chloride  SA (KLOR-CON  M) 20 MEQ tablet TAKE 1 TABLET EVERY DAY 02/03/23  Yes Theophilus Andrews, Tully GRADE, MD  Probiotic Product (PROBIOTIC PO) Take 1 capsule by mouth daily.   Yes [provider]  Accu-Chek FastClix Lancets MISC 1 each by Other route daily. 10/14/22   Theophilus Andrews, Tully GRADE, MD  albuterol  (VENTOLIN  HFA) [phone] Base) MCG/ACT inhaler Inhale 1 puff into the lungs every 6 (six) hours as needed for wheezing. Patient not taking: Reported on 05/19/2023 10/06/21   [provider]  Blood Glucose Monitoring Suppl (TRUE METRIX METER) DEVI 1 each by Does not apply route daily. 09/28/20   Theophilus Andrews, Tully GRADE, MD  fluticasone  (FLONASE ) 50 MCG/ACT nasal spray Place 2 sprays into both nostrils daily. Patient not taking: Reported on 06/08/2023 07/04/22   Rai, Nydia POUR, MD  polyethylene glycol powder (GLYCOLAX /MIRALAX ) 17 GM/SCOOP powder Take 17 g by mouth 2 (two) times daily. 07/03/22   Rai, Ripudeep K, MD  psyllium (HYDROCIL/METAMUCIL) 95 % PACK Take 1 packet by mouth daily. 07/04/22   Rai, Nydia POUR, MD  TRUE METRIX BLOOD GLUCOSE TEST test strip TEST BLOOD SUGAR EVERY DAY 05/18/23   Theophilus Andrews, Tully GRADE, MD    Current Outpatient Medications  Medication Sig Dispense Refill   amLODipine  (NORVASC ) 10 MG tablet TAKE 1 TABLET EVERY DAY 90 tablet 1   aspirin  EC 81 MG tablet Take 81 mg by mouth in the morning.     aspirin -acetaminophen -caffeine  (EXCEDRIN  MIGRAINE) 250-250-65 MG tablet Take 1-2 tablets by mouth daily as needed for headache or migraine.     atorvastatin  (LIPITOR) 20 MG tablet Take 1 tablet (20 mg total) by mouth daily. hold until PCP follow up     bisoprolol  (ZEBETA ) 5 MG tablet TAKE 1 TABLET EVERY DAY 90 tablet 1   Cholecalciferol  (VITAMIN D3) 50 MCG  (2000 UT) TABS Take 2,000 Units by mouth in the morning.     furosemide  (LASIX ) 20 MG tablet TAKE 1 TABLET EVERY DAY 90 tablet 1   ipratropium (ATROVENT ) 0.03 % nasal spray Place 2 sprays into both nostrils 3 (three) times daily.     LINZESS  290 MCG CAPS capsule TAKE 1 CAPSULE EVERY DAY BEFORE BREAKFAST 90 capsule 3   loratadine  (CLARITIN ) 10 MG tablet Take 1 tablet (10 mg total) by mouth daily. 90 tablet 1   Multiple Vitamins-Minerals (PRESERVISION AREDS 2 PO) Take 1 tablet by mouth in the morning and at bedtime.     Omega-3 Fatty Acids (FISH OIL) 1000 MG CAPS Take 2,000 mg by mouth daily.     pantoprazole  (PROTONIX ) 40 MG tablet TAKE 1 TABLET BY MOUTH EVERY DAY 90 tablet 1   Polyethyl Glycol-Propyl Glycol (SYSTANE ULTRA) 0.4-0.3 % SOLN Place 1-2  drops into both eyes 3 (three) times daily as needed (dry/irritated eyes.).     potassium chloride  SA (KLOR-CON  M) 20 MEQ tablet TAKE 1 TABLET EVERY DAY 90 tablet 3   Probiotic Product (PROBIOTIC PO) Take 1 capsule by mouth daily.     Accu-Chek FastClix Lancets MISC 1 each by Other route daily. 102 each 3   albuterol  (VENTOLIN  HFA) 108 (90 Base) MCG/ACT inhaler Inhale 1 puff into the lungs every 6 (six) hours as needed for wheezing. (Patient not taking: Reported on 05/19/2023)     Blood Glucose Monitoring Suppl (TRUE METRIX METER) DEVI 1 each by Does not apply route daily. 1 each 1   fluticasone  (FLONASE ) 50 MCG/ACT nasal spray Place 2 sprays into both nostrils daily. (Patient not taking: Reported on 06/08/2023) 16 g 2   polyethylene glycol powder (GLYCOLAX /MIRALAX ) 17 GM/SCOOP powder Take 17 g by mouth 2 (two) times daily. 3350 g 1   psyllium (HYDROCIL/METAMUCIL) 95 % PACK Take 1 packet by mouth daily. 240 each 3   TRUE METRIX BLOOD GLUCOSE TEST test strip TEST BLOOD SUGAR EVERY DAY 100 strip 3   Current Facility-Administered Medications  Medication Dose Route Frequency Provider Last Rate Last Admin   0.9 %  sodium chloride  infusion  500 mL Intravenous  Once Brier Reid, Elspeth SQUIBB, MD        Allergies as of 06/08/2023 - Review Complete 06/08/2023  Allergen Reaction Noted   Nitrofurantoin Other (See Comments) 01/09/2023   Penicillins Hives 12/07/2018   Coconut oil Hives, Swelling, and Rash 03/08/2022   Other Hives, Swelling, and Rash 07/08/2019   Sulfamethoxazole -trimethoprim  Nausea And Vomiting and Nausea Only 08/22/2021    Family History  Problem Relation Age of Onset   CAD Maternal Grandmother    Heart disease Maternal Grandmother    Pneumonia Maternal Grandfather    Liver disease Neg Hx    Colon cancer Neg Hx    Esophageal cancer Neg Hx    Rectal cancer Neg Hx    Stomach cancer Neg Hx     Social History   Socioeconomic History   Marital status: Widowed    Spouse name: Not on file   Number of children: 4   Years of education: Not on file   Highest education level: Not on file  Occupational History   Occupation: retred  Tobacco Use   Smoking status: Former   Smokeless tobacco: Never  Vaping Use   Vaping status: Never Used  Substance and Sexual Activity   Alcohol  use: Not Currently   Drug use: Never   Sexual activity: Not Currently    Birth control/protection: Surgical  Other Topics Concern   Not on file  Social History Narrative   Not on file   Social Drivers of Health   Financial Resource Strain: Not on file  Food Insecurity: No Food Insecurity (01/08/2023)   Hunger Vital Sign    Worried About Running Out of Food in the Last Year: Never true    Ran Out of Food in the Last Year: Never true  Transportation Needs: No Transportation Needs (01/08/2023)   PRAPARE - Administrator, Civil Service (Medical): No    Lack of Transportation (Non-Medical): No  Physical Activity: Not on file  Stress: Not on file  Social Connections: Not on file  Intimate Partner Violence: Not At Risk (01/08/2023)   Humiliation, Afraid, Rape, and Kick questionnaire    Fear of Current or Ex-Partner: No    Emotionally Abused:  No  Physically Abused: No    Sexually Abused: No    Review of Systems: All other review of systems negative except as mentioned in the HPI.  Physical Exam: Vital signs BP (!) 161/70   Pulse (!) 50   Temp (!) 97.3 F (36.3 C)   Ht 5' 2 (1.575 m)   Wt 153 lb (69.4 kg)   SpO2 99%   BMI 27.98 kg/m   General:   Alert,  Well-developed, pleasant and cooperative in NAD Lungs:  Clear throughout to auscultation.   Heart:  Regular rate and rhythm Abdomen:  Soft, nontender and nondistended.   Neuro/Psych:  Alert and cooperative. Normal mood and affect. A and O x 3  Marcey Naval, MD [facility]

## 2023-06-08 NOTE — Progress Notes (Signed)
 Pt's states no medical or surgical changes since previsit or office visit.

## 2023-06-08 NOTE — Progress Notes (Signed)
 Sedate, gd SR, tolerated procedure well, VSS, report to RN

## 2023-06-08 NOTE — Patient Instructions (Addendum)
 Full liquid diet, consistency of blended smoothies, yogurt is ok, thick soups, no solid foods, no chicken, no meat, no fish. Continue present medications, crushed or liquid medications  Handouts/information given for full liquid diet, hiatal hernia  YOU HAD AN ENDOSCOPIC PROCEDURE TODAY AT THE [facility]:   Refer to the procedure report that was given to you for any specific questions about what was found during the examination.  If the procedure report does not answer your questions, please call your gastroenterologist to clarify.  If you requested that your care partner not be given the details of your procedure findings, then the procedure report has been included in a sealed envelope for you to review at your convenience later.  YOU SHOULD EXPECT: Some feelings of bloating in the abdomen. Passage of more gas than usual.  Walking can help get rid of the air that was put into your GI tract during the procedure and reduce the bloating. If you had a lower endoscopy (such as a colonoscopy or flexible sigmoidoscopy) you may notice spotting of blood in your stool or on the toilet paper. If you underwent a bowel prep for your procedure, you may not have a normal bowel movement for a few days.  Please Note:  You might notice some irritation and congestion in your nose or some drainage.  This is from the oxygen used during your procedure.  There is no need for concern and it should clear up in a day or so.  SYMPTOMS TO REPORT IMMEDIATELY:  Following upper endoscopy (EGD)  Vomiting of blood or coffee ground material  New chest pain or pain under the shoulder blades  Painful or persistently difficult swallowing  New shortness of breath  Fever of 100F or higher  Black, tarry-looking stools  For urgent or emergent issues, a gastroenterologist can be reached at any hour by calling [phone]. Do not use MyChart messaging for urgent concerns.   DIET:  Full liquid diet is recommended.   Drink plenty of fluids but you should avoid alcoholic beverages for 24 hours.  ACTIVITY:  You should plan to take it easy for the rest of today and you should NOT DRIVE or use heavy machinery until tomorrow (because of the sedation medicines used during the test).    FOLLOW UP: Our staff will call the number listed on your records the next business day following your procedure.  We will call around 7:15- 8:00 am to check on you and address any questions or concerns that you may have regarding the information given to you following your procedure. If we do not reach you, we will leave a message.     SIGNATURES/CONFIDENTIALITY: You and/or your care partner have signed paperwork which will be entered into your electronic medical record.  These signatures attest to the fact that that the information above on your After Visit Summary has been reviewed and is understood.  Full responsibility of the confidentiality of this discharge information lies with you and/or your care-partner.

## 2023-06-09 ENCOUNTER — Telehealth: Payer: Self-pay

## 2023-06-09 NOTE — Progress Notes (Signed)
 Late entry- During procedure shortly after placement of endue to do scope patient with desaturation - jaw lift preformed without adequate response - procedure stopped patinet bagged with quick rise in saturation to greater than 96. Throughout process pulse ox moved to several fingers due to false reading nail bed disease. Patient awoken without any complications

## 2023-06-09 NOTE — Telephone Encounter (Signed)
  Follow up Call-     06/08/2023    3:11 PM  Call back number  Post procedure Call Back phone  # [phone]  Permission to leave phone message Yes     Patient questions:  Do you have a fever, pain , or abdominal swelling? No. Pain Score  0 *  Have you tolerated food without any problems? Yes.    Have you been able to return to your normal activities? Yes.    Do you have any questions about your discharge instructions: Diet   No. Medications  No. Follow up visit  No.  Do you have questions or concerns about your Care? No.  Actions: * If pain score is 4 or above: No action needed, pain <4.

## 2023-06-10 ENCOUNTER — Telehealth: Payer: Self-pay

## 2023-06-10 NOTE — Telephone Encounter (Signed)
 Copied from CRM [id number]. Topic: Clinical - Medical Advice >> Jun 10, 2023  1:20 PM Juleen Oakland F wrote: Reason for CRM: Patient daughter request call back regarding patient being on a liquid diet due to esophagus issues, also wants to discuss medications

## 2023-06-11 ENCOUNTER — Telehealth: Payer: Self-pay

## 2023-06-11 DIAGNOSIS — R1319 Other dysphagia: Secondary | ICD-10-CM

## 2023-06-11 NOTE — Telephone Encounter (Signed)
Spoke with daughter and she informed me of the following:  1. Pulsion esophageal diverticulum at the level the carina. barium tablet became lodged at the level of the diverticulum. 2. Esophageal dysmotility consistent with moderate presbyesophagus. 3. Luminal diameter of the distal esophagus could not be assessed as barium tablet lodged in the midesophagus.  The patient is now on a liquid diet.  The daughter has questions concerning how go give the patient her medication.  Urgent Referral for pharmacy placed.

## 2023-06-11 NOTE — Telephone Encounter (Signed)
Copied from CRM [id number]. Topic: General - Other >> Jun 11, 2023  3:11 PM Denese Killings wrote: Reason for CRM: Patient's daughter [name] is returning Rachel's call. Please call [name] regarding previous CRM.

## 2023-06-11 NOTE — Telephone Encounter (Signed)
Left message on machine for daughter returning her call 

## 2023-06-11 NOTE — Telephone Encounter (Signed)
Spoke to daughter.  See phone note.

## 2023-06-19 ENCOUNTER — Other Ambulatory Visit (HOSPITAL_COMMUNITY): Payer: Self-pay

## 2023-06-24 ENCOUNTER — Ambulatory Visit (INDEPENDENT_AMBULATORY_CARE_PROVIDER_SITE_OTHER): Payer: PPO | Admitting: Internal Medicine

## 2023-06-24 ENCOUNTER — Encounter: Payer: Self-pay | Admitting: Internal Medicine

## 2023-06-24 VITALS — BP 120/64 | HR 59 | Temp 98.7°F | Wt 148.9 lb

## 2023-06-24 DIAGNOSIS — E1169 Type 2 diabetes mellitus with other specified complication: Secondary | ICD-10-CM | POA: Diagnosis not present

## 2023-06-24 DIAGNOSIS — E785 Hyperlipidemia, unspecified: Secondary | ICD-10-CM

## 2023-06-24 DIAGNOSIS — Q396 Congenital diverticulum of esophagus: Secondary | ICD-10-CM

## 2023-06-24 DIAGNOSIS — E440 Moderate protein-calorie malnutrition: Secondary | ICD-10-CM | POA: Diagnosis not present

## 2023-06-24 DIAGNOSIS — R1319 Other dysphagia: Secondary | ICD-10-CM | POA: Diagnosis not present

## 2023-06-24 LAB — POCT GLYCOSYLATED HEMOGLOBIN (HGB A1C): Hemoglobin A1C: 6.3 % — AB (ref 4.0–5.6)

## 2023-06-24 NOTE — Progress Notes (Signed)
Established Patient Office Visit     CC/Reason for Visit: Follow-up chronic conditions  HPI: Stacey Armstrong is a 89 y.o. female who is coming in today for the above mentioned reasons.  She has been dealing with dysphagia for some time.  She saw GI who performed an EGD and was found to have a large esophageal diverticulum with significant dysmotility and food impaction.  Feeding tube was discussed but patient and daughter decline.  The only alternative is now liquid diet.  She is having a hard time keeping up her oral intake with it only.  She has been drinking at least 1 Premier protein shake a day but mostly drinks soup.   Past Medical/Surgical History: Past Medical History:  Diagnosis Date   Arthritis    Bronchitis    Complication of anesthesia    after gallbladder surgery with extubation had shallow respirations,  needed to continue intudation for additonal 10-12 days   Diabetes (HCC)    DVT (deep venous thrombosis) (HCC)    left leg after hysterectomy   GERD (gastroesophageal reflux disease)    History of COVID-19 05/2021   History of kidney stones    History of transient ischemic attack (TIA)    HOH (hard of hearing)    Hyperlipidemia    Hypertension    Kidney stones 2023   Sleep apnea     Past Surgical History:  Procedure Laterality Date   ABDOMINAL HYSTERECTOMY     APPENDECTOMY     CATARACT EXTRACTION, BILATERAL     CHOLECYSTECTOMY     COLONOSCOPY     CYSTOSCOPY WITH STENT PLACEMENT Right 08/14/2021   Procedure: CYSTOSCOPY WITH STENT PLACEMENT;  Surgeon: Crist Fat, MD;  Location: [facility];  Service: Urology;  Laterality: Right;  ONLY NEEDS 30 MIN   EXTRACORPOREAL SHOCK WAVE LITHOTRIPSY Right 02/06/2022   Procedure: RIGHT EXTRACORPOREAL SHOCK WAVE LITHOTRIPSY (ESWL);  Surgeon: Crist Fat, MD;  Location: [facility];  Service: Urology;  Laterality: Right;    Social History:  reports that she has quit smoking. She has never used  smokeless tobacco. She reports that she does not currently use alcohol. She reports that she does not use drugs.  Allergies: Allergies  Allergen Reactions   Nitrofurantoin Other (See Comments)    Drug-induced liver injury Aug 2024   Penicillins Hives    Other Reaction(s): Other (See Comments)  Unknown to pt daughter   Coconut Oil Hives, Swelling and Rash   Other Hives, Swelling and Rash    Berries    Sulfamethoxazole-Trimethoprim Nausea And Vomiting and Nausea Only    Other Reaction(s): GI Intolerance    Family History:  Family History  Problem Relation Age of Onset   CAD Maternal Grandmother    Heart disease Maternal Grandmother    Pneumonia Maternal Grandfather    Liver disease Neg Hx    Colon cancer Neg Hx    Esophageal cancer Neg Hx    Rectal cancer Neg Hx    Stomach cancer Neg Hx      Current Outpatient Medications:    Accu-Chek FastClix Lancets MISC, 1 each by Other route daily., Disp: 102 each, Rfl: 3   albuterol (VENTOLIN HFA) 108 (90 Base) MCG/ACT inhaler, Inhale 1 puff into the lungs every 6 (six) hours as needed for wheezing., Disp: , Rfl:    amLODipine (NORVASC) 10 MG tablet, TAKE 1 TABLET EVERY DAY, Disp: 90 tablet, Rfl: 1   aspirin EC 81 MG tablet, Take  81 mg by mouth in the morning., Disp: , Rfl:    aspirin-acetaminophen-caffeine (EXCEDRIN MIGRAINE) 250-250-65 MG tablet, Take 1-2 tablets by mouth daily as needed for headache or migraine., Disp: , Rfl:    atorvastatin (LIPITOR) 20 MG tablet, Take 1 tablet (20 mg total) by mouth daily. hold until PCP follow up, Disp: , Rfl:    bisoprolol (ZEBETA) 5 MG tablet, TAKE 1 TABLET EVERY DAY, Disp: 90 tablet, Rfl: 1   Blood Glucose Monitoring Suppl (TRUE METRIX METER) DEVI, 1 each by Does not apply route daily., Disp: 1 each, Rfl: 1   Cholecalciferol (VITAMIN D3) 50 MCG (2000 UT) TABS, Take 2,000 Units by mouth in the morning., Disp: , Rfl:    fluticasone (FLONASE) 50 MCG/ACT nasal spray, Place 2 sprays into both  nostrils daily., Disp: 16 g, Rfl: 2   furosemide (LASIX) 20 MG tablet, TAKE 1 TABLET EVERY DAY, Disp: 90 tablet, Rfl: 1   ipratropium (ATROVENT) 0.03 % nasal spray, Place 2 sprays into both nostrils 3 (three) times daily., Disp: , Rfl:    loratadine (CLARITIN) 10 MG tablet, Take 1 tablet (10 mg total) by mouth daily., Disp: 90 tablet, Rfl: 1   Multiple Vitamins-Minerals (PRESERVISION AREDS 2 PO), Take 1 tablet by mouth in the morning and at bedtime., Disp: , Rfl:    Omega-3 Fatty Acids (FISH OIL) 1000 MG CAPS, Take 2,000 mg by mouth daily., Disp: , Rfl:    pantoprazole (PROTONIX) 40 MG tablet, TAKE 1 TABLET BY MOUTH EVERY DAY, Disp: 90 tablet, Rfl: 1   Polyethyl Glycol-Propyl Glycol (SYSTANE ULTRA) 0.4-0.3 % SOLN, Place 1-2 drops into both eyes 3 (three) times daily as needed (dry/irritated eyes.)., Disp: , Rfl:    polyethylene glycol powder (GLYCOLAX/MIRALAX) 17 GM/SCOOP powder, Take 17 g by mouth 2 (two) times daily., Disp: 3350 g, Rfl: 1   potassium chloride SA (KLOR-CON M) 20 MEQ tablet, TAKE 1 TABLET EVERY DAY, Disp: 90 tablet, Rfl: 3   Probiotic Product (PROBIOTIC PO), Take 1 capsule by mouth daily., Disp: , Rfl:    psyllium (HYDROCIL/METAMUCIL) 95 % PACK, Take 1 packet by mouth daily., Disp: 240 each, Rfl: 3   TRUE METRIX BLOOD GLUCOSE TEST test strip, TEST BLOOD SUGAR EVERY DAY, Disp: 100 strip, Rfl: 3   LINZESS 290 MCG CAPS capsule, TAKE 1 CAPSULE EVERY DAY BEFORE BREAKFAST (Patient not taking: Reported on 06/24/2023), Disp: 90 capsule, Rfl: 3  Review of Systems:  Negative unless indicated in HPI.   Physical Exam: Vitals:   06/24/23 1024  BP: 120/64  Pulse: (!) 59  Temp: 98.7 F (37.1 C)  TempSrc: Oral  SpO2: 98%  Weight: 148 lb 14.4 oz (67.5 kg)    Body mass index is 27.23 kg/m.   Physical Exam Vitals reviewed.  Constitutional:      Appearance: Normal appearance.  HENT:     Head: Normocephalic and atraumatic.  Eyes:     Conjunctiva/sclera: Conjunctivae normal.   Cardiovascular:     Rate and Rhythm: Normal rate and regular rhythm.  Pulmonary:     Effort: Pulmonary effort is normal.     Breath sounds: Normal breath sounds.  Skin:    General: Skin is warm and dry.  Neurological:     General: No focal deficit present.     Mental Status: She is alert and oriented to person, place, and time.  Psychiatric:        Mood and Affect: Mood normal.        Behavior: Behavior  normal.        Thought Content: Thought content normal.        Judgment: Judgment normal.      Impression and Plan:  Type 2 diabetes mellitus with hyperlipidemia (HCC) -     POCT glycosylated hemoglobin (Hb A1C)  Esophageal dysphagia -     Amb ref to Medical Nutrition Therapy-MNT  Diverticulum of esophagus -     Amb ref to Medical Nutrition Therapy-MNT  Moderate protein-calorie malnutrition (HCC) -     Amb ref to Medical Nutrition Therapy-MNT   -A1c of 6.3 demonstrates excellent diabetic management. -Because she is now on a liquid diet due to her esophageal dysmotility and diverticulum per GI recommendations, she is having a hard time keeping up with nutrition.  She has lost 5 pounds.  I will go ahead and place a referral to dietitian.  Time spent:31 minutes reviewing chart, interviewing and examining patient and formulating plan of care.     Chaya Jan, MD [facility]

## 2023-06-29 ENCOUNTER — Encounter: Payer: PPO | Attending: Internal Medicine | Admitting: Dietician

## 2023-06-29 ENCOUNTER — Encounter: Payer: Self-pay | Admitting: Dietician

## 2023-06-29 VITALS — Ht 64.0 in | Wt 146.0 lb

## 2023-06-29 DIAGNOSIS — R1319 Other dysphagia: Secondary | ICD-10-CM | POA: Insufficient documentation

## 2023-06-29 DIAGNOSIS — E785 Hyperlipidemia, unspecified: Secondary | ICD-10-CM | POA: Insufficient documentation

## 2023-06-29 DIAGNOSIS — E1169 Type 2 diabetes mellitus with other specified complication: Secondary | ICD-10-CM | POA: Insufficient documentation

## 2023-06-29 NOTE — Progress Notes (Signed)
Medical Nutrition Therapy  Appointment Start time:  1400  Appointment End time:  1500 Patient is here today with her daughter  Primary concerns today: adequate intake on liquid diet to maintain weight/nutrition.  Daughter is concerned that she is dehydrated.  Patient reports that her urine was clear today.  Blood pressure WNL.  She was light headed this am.  Provided water and patient drank about 8 oz. Blood glucose 92-123 fasting  Referral diagnosis: esophageal dysphagia, diverticulum of esophagus, moderate protein-calorie malnutrition, type 2 diabetes Preferred learning style: no preference indicated Learning readiness: ready, change in progress   NUTRITION ASSESSMENT  66" 146 lbs today at home - she is not drinking enough and also lost weight to "cold" symptoms over the weekend 158 lbs 3 weeks ago when she was told to stop solid food  Clinical Medical Hx: HOH, esophageal dysphagia, diverticulum of esophagus (with significant dysmotility and food impaction)- patient and daughter decline feeding tube and now on a liquid diet per MD, moderate protein-calorie malnutrition, type 2 diabetes, HLD, HTN, CKD, GERD, arthritis, OSA Medications: lasix, miralax, psyllium, protonix, vitamin D,  potassium, MVI, probiotic Labs: GFR 30 01/27/2023,  Notable Signs/Symptoms: HOH, does not like to drink a lot.  Wants to eat real food.  Lifestyle & Dietary Hx Patient lives in independent living at [facility].  They are puree her food with water and her daughter purees the food some with broth or milk. She crushes her med. She wants to maintain her strength and weight and attend her grandson's barmitzvah in 2 months.  She is allergic to coconut, berries, avoids pork, eat some shellfish, does not eat meat and milk at the same meal.  Supplements: see med list Sleep: sleeps 11 pm until noon but wakes every 2 hours to go to the restroom Stress / self-care: needs assistance with meals.  Patient does her own basic  self care. Current average weekly physical activity: walks with a walker  24-Hr Dietary Recall First Meal: 4-5 oz pot roast with potatoes and carrots liquefied with beef broth OR beet borsch with sour cream Snack: Ensure (150 calories) or yogurt Second Meal:   Snack: skips Third Meal: none (usually eats meal from [facility] at this time (liquids or thinned pureed food) Snack: none Beverages: water, coffee with splenda  Estimated Energy Needs Calories: 1300-1400 Protein: 65g   NUTRITION DIAGNOSIS  NB-1.1 Food and nutrition-related knowledge deficit As related to balance of calories and protein to prevent weight loss and maintain nutrition.  As evidenced by diet hx and patient report, 10 lb weight loss in 3 weeks.   NUTRITION INTERVENTION  Nutrition education (E-1) on the following topics:  Liquid diet Blended foods do not need to be strained if they are blended to a completely smooth consistency. Tips to add calories (margarine or oil, regular sour cream, whole milk, whole milk yogurt) Importance of several small meals and snacks Discussed to focus on calories more than protein as the foods that she is eating will proved adequate protein if she is eating adequate calories Supplement options - provided samples of Ensure and Glucerna as well as coupons Diabetes and current diet - focus to increase intake rather than restrict at this time  Handouts Provided Include  Full Liquid Nutrition Therapy from [facility]  Learning Style & Readiness for Change Teaching method utilized: Visual & Auditory  Demonstrated degree of understanding via: Teach Back  Barriers to learning/adherence to lifestyle change: HOH, poor appetite, very sleepy  Goals Established by Pt Add  cream to coffee Ensure Complete (350 calories, 30 grams protein) Glucerna (180 calories, 15 grams protein Carnation Breakfast in whole milk Drink 2 meals daily  Drink 2 supplements daily Additional snacks throughout the day such as  a thin yogurt or keifer Add margarine or sour cream to soup add calories   MONITORING & EVALUATION Dietary intake, weekly physical activity prn  Next Steps  Patient is to call for questions.

## 2023-06-29 NOTE — Patient Instructions (Addendum)
Add cream to coffee Ensure Complete (350 calories, 30 grams protein) Glucerna (180 calories, 15 grams protein Carnation Breakfast in whole milk Drink 2 meals daily  Drink 2 supplements daily Additional snacks throughout the day such as a thin yogurt or keifer Add margarine or sour cream to soup add calories

## 2023-07-01 ENCOUNTER — Ambulatory Visit: Payer: Self-pay | Admitting: Internal Medicine

## 2023-07-01 NOTE — Telephone Encounter (Signed)
 Chief Complaint: Covid+ Symptoms: Cough, headache, runny nose, fatigue  Frequency: constant  Pertinent Negatives: Patient denies fever, chest pain, SOB Disposition: [] ED /[] Urgent Care (no appt availability in office) / [x] Appointment(In office/virtual)/ []  [facility]/ [] Home Care/ [] Refused Recommended Disposition /[] [facility]/ []  Follow-up with PCP Additional Notes: Spoke to patient's daughter Vina (signed DPR on file) who stated the patient tested positive for Covid yesterday. Patient is currently on a liquid diet as well. Care advice was given and Vina wants an appointment with PCP just to make sure they are doing everything to keep the patient out of the hospital. Patient has been scheduled for a MyChart video visit tomorrow with PCP.   Copied from CRM [id number]. Topic: General - Other >> Jul 01, 2023 11:56 AM Thersia BROCKS wrote: Reason for CRM: Patient daughter called in stated the patient tested positive for Covid yesterday , wanted to know if there was anything she needs to do to keep her out of the hospital. Having difficulty with her nose being stuffy , would like to know if something can be called in for her Reason for Disposition  [1] HIGH RISK patient (e.g., weak immune system, age > 64 years, obesity with BMI 30 or higher, pregnant, chronic lung disease or other chronic medical condition) AND [2] COVID symptoms (e.g., cough, fever)  (Exceptions: Already seen by PCP and no new or worsening symptoms.)  Answer Assessment - Initial Assessment Questions 1. COVID-19 DIAGNOSIS: How do you know that you have COVID? (e.g., positive lab test or self-test, diagnosed by doctor or NP/PA, symptoms after exposure).     Home self test  2. COVID-19 EXPOSURE: Was there any known exposure to COVID before the symptoms began? CDC Definition of close contact: within 6 feet (2 meters) for a total of 15 minutes or more over a 24-hour period.      No  3. ONSET: When did the COVID-19  symptoms start?     Saturday  4. WORST SYMPTOM: What is your worst symptom? (e.g., cough, fever, shortness of breath, muscle aches)     Cough 5. COUGH: Do you have a cough? If Yes, ask: How bad is the cough?       Moderate  6. FEVER: Do you have a fever? If Yes, ask: What is your temperature, how was it measured, and when did it start?     No  7. RESPIRATORY STATUS: Describe your breathing? (e.g., normal; shortness of breath, wheezing, unable to speak)      Normal  8. BETTER-SAME-WORSE: Are you getting better, staying the same or getting worse compared to yesterday?  If getting worse, ask, In what way?     Worse  9. OTHER SYMPTOMS: Do you have any other symptoms?  (e.g., chills, fatigue, headache, loss of smell or taste, muscle pain, sore throat)     Cough, headache, runny nose, fatigue  10. HIGH RISK DISEASE: Do you have any chronic medical problems? (e.g., asthma, heart or lung disease, weak immune system, obesity, etc.)       Weak immune system 11. VACCINE: Have you had the COVID-19 vaccine? If Yes, ask: Which one, how many shots, when did you get it?       3 vaccines  Protocols used: Coronavirus (COVID-19) Diagnosed or Suspected-A-AH

## 2023-07-02 ENCOUNTER — Encounter: Payer: Self-pay | Admitting: Internal Medicine

## 2023-07-02 ENCOUNTER — Telehealth: Payer: PPO | Admitting: Internal Medicine

## 2023-07-02 DIAGNOSIS — U071 COVID-19: Secondary | ICD-10-CM | POA: Diagnosis not present

## 2023-07-02 NOTE — Progress Notes (Signed)
 Per patient no change in vitals since last visit, unable to obtain new vitals due to telehealth visit.

## 2023-07-02 NOTE — Progress Notes (Signed)
 Virtual Visit via Video Note  I connected with Stacey Armstrong on 07/02/23 at  2:00 PM EST by a video enabled telemedicine application and verified that I am speaking with the correct person using two identifiers.  Location patient: home Location provider: work office Persons participating in the virtual visit: patient, provider  I discussed the limitations of evaluation and management by telemedicine and the availability of in person appointments. The patient expressed understanding and agreed to proceed.   HPI: She started having COVID symptoms 8 days ago, tested +2 days ago.  She has been having head cold symptoms and coughing.   ROS: Negative unless indicated in HPI.  Past Medical History:  Diagnosis Date   Arthritis    Bronchitis    Complication of anesthesia    after gallbladder surgery with extubation had shallow respirations,  needed to continue intudation for additonal 10-12 days   Diabetes (HCC)    DVT (deep venous thrombosis) (HCC)    left leg after hysterectomy   GERD (gastroesophageal reflux disease)    History of COVID-19 05/2021   History of kidney stones    History of transient ischemic attack (TIA)    HOH (hard of hearing)    Hyperlipidemia    Hypertension    Kidney stones 2023   Sleep apnea     Past Surgical History:  Procedure Laterality Date   ABDOMINAL HYSTERECTOMY     APPENDECTOMY     CATARACT EXTRACTION, BILATERAL     CHOLECYSTECTOMY     COLONOSCOPY     CYSTOSCOPY WITH STENT PLACEMENT Right 08/14/2021   Procedure: CYSTOSCOPY WITH STENT PLACEMENT;  Surgeon: Cam Morene ORN, MD;  Location: [facility];  Service: Urology;  Laterality: Right;  ONLY NEEDS 30 MIN   EXTRACORPOREAL SHOCK WAVE LITHOTRIPSY Right 02/06/2022   Procedure: RIGHT EXTRACORPOREAL SHOCK WAVE LITHOTRIPSY (ESWL);  Surgeon: Cam Morene ORN, MD;  Location: [facility];  Service: Urology;  Laterality: Right;    Family History  Problem Relation Age of Onset    CAD Maternal Grandmother    Heart disease Maternal Grandmother    Pneumonia Maternal Grandfather    Liver disease Neg Hx    Colon cancer Neg Hx    Esophageal cancer Neg Hx    Rectal cancer Neg Hx    Stomach cancer Neg Hx     SOCIAL HX:   reports that she has quit smoking. She has never used smokeless tobacco. She reports that she does not currently use alcohol . She reports that she does not use drugs.   Current Outpatient Medications:    Accu-Chek FastClix Lancets MISC, 1 each by Other route daily., Disp: 102 each, Rfl: 3   albuterol  (VENTOLIN  HFA) 108 (90 Base) MCG/ACT inhaler, Inhale 1 puff into the lungs every 6 (six) hours as needed for wheezing., Disp: , Rfl:    amLODipine  (NORVASC ) 10 MG tablet, TAKE 1 TABLET EVERY DAY, Disp: 90 tablet, Rfl: 1   aspirin  EC 81 MG tablet, Take 81 mg by mouth in the morning., Disp: , Rfl:    aspirin -acetaminophen -caffeine  (EXCEDRIN  MIGRAINE) 250-250-65 MG tablet, Take 1-2 tablets by mouth daily as needed for headache or migraine., Disp: , Rfl:    atorvastatin  (LIPITOR) 20 MG tablet, Take 1 tablet (20 mg total) by mouth daily. hold until PCP follow up, Disp: , Rfl:    bisoprolol  (ZEBETA ) 5 MG tablet, TAKE 1 TABLET EVERY DAY, Disp: 90 tablet, Rfl: 1   Blood Glucose Monitoring Suppl (TRUE METRIX METER)  DEVI, 1 each by Does not apply route daily., Disp: 1 each, Rfl: 1   Cholecalciferol  (VITAMIN D3) 50 MCG (2000 UT) TABS, Take 2,000 Units by mouth in the morning., Disp: , Rfl:    fluticasone  (FLONASE ) 50 MCG/ACT nasal spray, Place 2 sprays into both nostrils daily., Disp: 16 g, Rfl: 2   furosemide  (LASIX ) 20 MG tablet, TAKE 1 TABLET EVERY DAY, Disp: 90 tablet, Rfl: 1   ipratropium (ATROVENT ) 0.03 % nasal spray, Place 2 sprays into both nostrils 3 (three) times daily., Disp: , Rfl:    LINZESS  290 MCG CAPS capsule, TAKE 1 CAPSULE EVERY DAY BEFORE BREAKFAST, Disp: 90 capsule, Rfl: 3   loratadine  (CLARITIN ) 10 MG tablet, Take 1 tablet (10 mg total) by mouth  daily., Disp: 90 tablet, Rfl: 1   Multiple Vitamins-Minerals (PRESERVISION AREDS 2 PO), Take 1 tablet by mouth in the morning and at bedtime., Disp: , Rfl:    Omega-3 Fatty Acids (FISH OIL) 1000 MG CAPS, Take 2,000 mg by mouth daily., Disp: , Rfl:    pantoprazole  (PROTONIX ) 40 MG tablet, TAKE 1 TABLET BY MOUTH EVERY DAY, Disp: 90 tablet, Rfl: 1   Polyethyl Glycol-Propyl Glycol (SYSTANE ULTRA) 0.4-0.3 % SOLN, Place 1-2 drops into both eyes 3 (three) times daily as needed (dry/irritated eyes.)., Disp: , Rfl:    polyethylene glycol powder (GLYCOLAX /MIRALAX ) 17 GM/SCOOP powder, Take 17 g by mouth 2 (two) times daily., Disp: 3350 g, Rfl: 1   potassium chloride  SA (KLOR-CON  M) 20 MEQ tablet, TAKE 1 TABLET EVERY DAY, Disp: 90 tablet, Rfl: 3   Probiotic Product (PROBIOTIC PO), Take 1 capsule by mouth daily., Disp: , Rfl:    psyllium (HYDROCIL/METAMUCIL) 95 % PACK, Take 1 packet by mouth daily., Disp: 240 each, Rfl: 3   TRUE METRIX BLOOD GLUCOSE TEST test strip, TEST BLOOD SUGAR EVERY DAY, Disp: 100 strip, Rfl: 3  EXAM:   VITALS per patient if applicable: O2 saturations of 95%  GENERAL: alert, oriented, appears well and in no acute distress  HEENT: atraumatic, conjunttiva clear, no obvious abnormalities on inspection of external nose and ears  NECK: normal movements of the head and neck  LUNGS: on inspection no signs of respiratory distress, breathing rate appears normal, no obvious gross increased work of breathing, gasping or wheezing  CV: no obvious cyanosis  MS: moves all visible extremities without noticeable abnormality  PSYCH/NEURO: pleasant and cooperative, no obvious depression or anxiety, speech and thought processing grossly intact  ASSESSMENT AND PLAN:   COVID-19  -She is well past the window of treatment for COVID.  Have advised continued symptomatic management as they are.  Return to the office for high fevers or shortness of breath.   I discussed the assessment and treatment  plan with the patient. The patient was provided an opportunity to ask questions and all were answered. The patient agreed with the plan and demonstrated an understanding of the instructions.   The patient was advised to call back or seek an in-person evaluation if the symptoms worsen or if the condition fails to improve as anticipated.    Tully Theophilus Andrews, MD  [facility]

## 2023-07-06 ENCOUNTER — Other Ambulatory Visit: Payer: Self-pay | Admitting: Internal Medicine

## 2023-07-06 DIAGNOSIS — E440 Moderate protein-calorie malnutrition: Secondary | ICD-10-CM

## 2023-07-06 DIAGNOSIS — R1319 Other dysphagia: Secondary | ICD-10-CM

## 2023-07-06 DIAGNOSIS — Q396 Congenital diverticulum of esophagus: Secondary | ICD-10-CM

## 2023-07-09 ENCOUNTER — Telehealth: Payer: Self-pay

## 2023-07-09 NOTE — Progress Notes (Signed)
Care Guide Pharmacy Note  07/09/2023 Name: Latiesha Harada MRN: [id number] DOB: 09/11/1927  Referred By: Philip Aspen, Limmie Patricia, MD Reason for referral: Care Coordination (Outreach to schedule with Pharm d )   Trinitey Roache is a 89 y.o. year old female who is a primary care patient of Henderson Cloud, MD.  Hermena Swint was referred to the pharmacist for assistance related to: DMII  Successful contact was made with the patient to discuss pharmacy services including being ready for the pharmacist to call at least 5 minutes before the scheduled appointment time and to have medication bottles and any blood pressure readings ready for review. The patient agreed to meet with the pharmacist via telephone visit on (date/time).07/13/2023  Penne Lash , RMA     [place], [facility] Guide  Direct Dial: [phone]  Website: [email or web address]

## 2023-07-13 ENCOUNTER — Other Ambulatory Visit (INDEPENDENT_AMBULATORY_CARE_PROVIDER_SITE_OTHER): Payer: PPO

## 2023-07-13 DIAGNOSIS — K5641 Fecal impaction: Secondary | ICD-10-CM

## 2023-07-13 DIAGNOSIS — R1319 Other dysphagia: Secondary | ICD-10-CM

## 2023-07-13 NOTE — Progress Notes (Unsigned)
   07/13/2023  Patient ID: Stacey Armstrong, female   DOB: 07-09-1927, 89 y.o.   MRN: [id number]  Contacted patient's daughter via telephone, with patient present as well, at request of PCP.  Purpose of visit was to review how to properly take her medications now that she is on a fully liquid diet and cannot swallow her medications.   Reviewed proper administration of all medications.

## 2023-07-14 ENCOUNTER — Other Ambulatory Visit (HOSPITAL_BASED_OUTPATIENT_CLINIC_OR_DEPARTMENT_OTHER): Payer: Self-pay

## 2023-07-14 ENCOUNTER — Other Ambulatory Visit (HOSPITAL_COMMUNITY): Payer: Self-pay

## 2023-07-14 MED ORDER — OMEPRAZOLE 40 MG PO CPDR
40.0000 mg | DELAYED_RELEASE_CAPSULE | Freq: Two times a day (BID) | ORAL | 1 refills | Status: DC
  Filled 2023-07-14: qty 180, 90d supply, fill #0
  Filled 2023-10-15: qty 180, 90d supply, fill #1

## 2023-07-14 MED ORDER — LINACLOTIDE 290 MCG PO CAPS
290.0000 ug | ORAL_CAPSULE | Freq: Every day | ORAL | 1 refills | Status: DC
  Filled 2023-07-14: qty 90, 90d supply, fill #0
  Filled 2023-10-15: qty 90, 90d supply, fill #1

## 2023-08-07 DIAGNOSIS — H353132 Nonexudative age-related macular degeneration, bilateral, intermediate dry stage: Secondary | ICD-10-CM | POA: Diagnosis not present

## 2023-08-07 LAB — HM DIABETES EYE EXAM

## 2023-08-18 DIAGNOSIS — N3941 Urge incontinence: Secondary | ICD-10-CM | POA: Diagnosis not present

## 2023-08-18 DIAGNOSIS — R350 Frequency of micturition: Secondary | ICD-10-CM | POA: Diagnosis not present

## 2023-09-11 ENCOUNTER — Other Ambulatory Visit (HOSPITAL_COMMUNITY): Payer: Self-pay

## 2023-09-17 ENCOUNTER — Other Ambulatory Visit: Payer: Self-pay | Admitting: Internal Medicine

## 2023-09-17 DIAGNOSIS — R600 Localized edema: Secondary | ICD-10-CM

## 2023-09-17 NOTE — Telephone Encounter (Signed)
 Copied from CRM [id number]. Topic: Clinical - Medication Refill >> Sep 17, 2023  3:57 PM Adonis Hoot wrote: Most Recent Primary Care Visit:  Provider: Redmond Candle H  Department: [facility]  Visit Type: PATIENT OUTREACH 60  Date: 07/13/2023  Medication: furosemide  (LASIX ) 20 MG tablet  loratadine  (CLARITIN ) 10 MG tablet  Has the patient contacted their pharmacy? No (Agent: If no, request that the patient contact the pharmacy for the refill. If patient does not wish to contact the pharmacy document the reason why and proceed with request.) (Agent: If yes, when and what did the pharmacy advise?)  Is this the correct pharmacy for this prescription? Yes If no, delete pharmacy and type the correct one.  This is the patient's preferred pharmacy:  [facility] [address] [place] [address] Phone: [phone] Fax: [phone]   Has the prescription been filled recently? No  Is the patient out of the medication? No(2 left)  Has the patient been seen for an appointment in the last year OR does the patient have an upcoming appointment? Yes  Can we respond through MyChart? No  Agent: Please be advised that Rx refills may take up to 3 business days. We ask that you follow-up with your pharmacy.

## 2023-09-18 ENCOUNTER — Other Ambulatory Visit (HOSPITAL_COMMUNITY): Payer: Self-pay

## 2023-09-21 ENCOUNTER — Other Ambulatory Visit (HOSPITAL_COMMUNITY): Payer: Self-pay

## 2023-09-21 MED ORDER — FUROSEMIDE 20 MG PO TABS
20.0000 mg | ORAL_TABLET | Freq: Every day | ORAL | 1 refills | Status: DC
  Filled 2023-09-21: qty 90, 90d supply, fill #0
  Filled 2023-10-15 – 2024-01-10 (×2): qty 90, 90d supply, fill #1

## 2023-09-21 MED ORDER — LORATADINE 10 MG PO TABS
10.0000 mg | ORAL_TABLET | Freq: Every day | ORAL | 1 refills | Status: DC
  Filled 2023-09-21 – 2023-10-15 (×2): qty 90, 90d supply, fill #0

## 2023-09-22 ENCOUNTER — Other Ambulatory Visit (HOSPITAL_COMMUNITY): Payer: Self-pay

## 2023-09-30 ENCOUNTER — Other Ambulatory Visit (HOSPITAL_COMMUNITY): Payer: Self-pay

## 2023-09-30 DIAGNOSIS — R350 Frequency of micturition: Secondary | ICD-10-CM | POA: Diagnosis not present

## 2023-09-30 DIAGNOSIS — N3941 Urge incontinence: Secondary | ICD-10-CM | POA: Diagnosis not present

## 2023-09-30 DIAGNOSIS — N3000 Acute cystitis without hematuria: Secondary | ICD-10-CM | POA: Diagnosis not present

## 2023-09-30 MED ORDER — MIRABEGRON ER 50 MG PO TB24
50.0000 mg | ORAL_TABLET | Freq: Every day | ORAL | 11 refills | Status: DC
Start: 2023-09-30 — End: 2024-01-06
  Filled 2023-09-30: qty 30, 30d supply, fill #0

## 2023-10-15 ENCOUNTER — Other Ambulatory Visit (HOSPITAL_COMMUNITY): Payer: Self-pay

## 2023-10-16 ENCOUNTER — Other Ambulatory Visit: Payer: Self-pay

## 2023-10-17 ENCOUNTER — Other Ambulatory Visit (HOSPITAL_COMMUNITY): Payer: Self-pay

## 2023-10-22 ENCOUNTER — Other Ambulatory Visit (HOSPITAL_COMMUNITY): Payer: Self-pay

## 2023-10-22 MED ORDER — GEMTESA 75 MG PO TABS
75.0000 mg | ORAL_TABLET | Freq: Every day | ORAL | 3 refills | Status: AC
  Filled 2023-10-22: qty 90, 90d supply, fill #0
  Filled 2024-01-06 (×2): qty 90, 90d supply, fill #1
  Filled 2024-04-14: qty 90, 90d supply, fill #2

## 2023-10-26 ENCOUNTER — Other Ambulatory Visit: Payer: Self-pay

## 2023-10-26 ENCOUNTER — Other Ambulatory Visit (HOSPITAL_COMMUNITY): Payer: Self-pay

## 2023-11-13 ENCOUNTER — Other Ambulatory Visit: Payer: Self-pay | Admitting: Internal Medicine

## 2023-11-13 NOTE — Telephone Encounter (Unsigned)
 Copied from CRM [id number]. Topic: Clinical - Medication Refill >> Nov 13, 2023  3:05 PM Turkey A wrote: Medication: potassium chloride  SA (KLOR-CON  M) 20 MEQ tablet; Accu-Chek FastClix Lancets MISC  Has the patient contacted their pharmacy? No (Agent: If no, request that the patient contact the pharmacy for the refill. If patient does not wish to contact the pharmacy document the reason why and proceed with request.) (Agent: If yes, when and what did the pharmacy advise?)  This is the patient's preferred pharmacy:  [place] [facility] [address] [place] [address] Phone: [phone] Fax: [phone]  Is this the correct pharmacy for this prescription? Yes If no, delete pharmacy and type the correct one.   Has the prescription been filled recently? No  Is the patient out of the medication? Yes  Has the patient been seen for an appointment in the last year OR does the patient have an upcoming appointment? Yes  Can we respond through MyChart? Yes  Agent: Please be advised that Rx refills may take up to 3 business days. We ask that you follow-up with your pharmacy.

## 2023-11-16 ENCOUNTER — Other Ambulatory Visit (HOSPITAL_COMMUNITY): Payer: Self-pay

## 2023-11-16 MED ORDER — POTASSIUM CHLORIDE CRYS ER 20 MEQ PO TBCR
20.0000 meq | EXTENDED_RELEASE_TABLET | Freq: Every day | ORAL | 3 refills | Status: AC
  Filled 2023-11-16: qty 90, 90d supply, fill #0
  Filled 2024-01-10 – 2024-02-10 (×5): qty 90, 90d supply, fill #1
  Filled 2024-02-12 – 2024-05-12 (×3): qty 90, 90d supply, fill #2
  Filled 2024-06-03: qty 90, 90d supply, fill #3

## 2023-11-18 ENCOUNTER — Other Ambulatory Visit (HOSPITAL_COMMUNITY): Payer: Self-pay

## 2023-11-18 ENCOUNTER — Other Ambulatory Visit: Payer: Self-pay | Admitting: Internal Medicine

## 2023-11-18 DIAGNOSIS — I10 Essential (primary) hypertension: Secondary | ICD-10-CM

## 2023-11-18 MED ORDER — AMLODIPINE BESYLATE 10 MG PO TABS
10.0000 mg | ORAL_TABLET | Freq: Every day | ORAL | 1 refills | Status: AC
  Filled 2023-11-18: qty 90, 90d supply, fill #0
  Filled 2024-03-24: qty 90, 90d supply, fill #1

## 2023-11-18 MED ORDER — ACCU-CHEK FASTCLIX LANCETS MISC
1.0000 | Freq: Every day | 3 refills | Status: DC
  Filled 2023-11-18: qty 102, 102d supply, fill #0

## 2023-11-18 MED ORDER — BISOPROLOL FUMARATE 5 MG PO TABS
5.0000 mg | ORAL_TABLET | Freq: Every day | ORAL | 1 refills | Status: AC
  Filled 2023-11-18: qty 90, 90d supply, fill #0
  Filled 2024-03-26: qty 90, 90d supply, fill #1

## 2023-11-18 NOTE — Telephone Encounter (Signed)
 Copied from CRM [id number]. Topic: Clinical - Medication Refill >> Nov 18, 2023  1:00 PM Burnard DEL wrote: Medication:  amLODipine  (NORVASC ) 10 MG tablet  bisoprolol  (ZEBETA ) 5 MG tablet  Accu-Chek FastClix Lancets MISC  Has the patient contacted their pharmacy? No (Agent: If no, request that the patient contact the pharmacy for the refill. If patient does not wish to contact the pharmacy document the reason why and proceed with request.) (Agent: If yes, when and what did the pharmacy advise?)  This is the patient's preferred pharmacy:  [place] [facility] [address] [place] [address] Phone: [phone] Fax: [phone]  Is this the correct pharmacy for this prescription? Yes If no, delete pharmacy and type the correct one.   Has the prescription been filled recently? Yes  Is the patient out of the medication? No(have a few weeks left)  Has the patient been seen for an appointment in the last year OR does the patient have an upcoming appointment? Yes  Can we respond through MyChart? Yes  Agent: Please be advised that Rx refills may take up to 3 business days. We ask that you follow-up with your pharmacy.

## 2023-11-24 ENCOUNTER — Other Ambulatory Visit (HOSPITAL_COMMUNITY): Payer: Self-pay

## 2023-11-28 ENCOUNTER — Other Ambulatory Visit (HOSPITAL_COMMUNITY): Payer: Self-pay

## 2023-11-30 ENCOUNTER — Other Ambulatory Visit (HOSPITAL_COMMUNITY): Payer: Self-pay

## 2023-12-09 ENCOUNTER — Other Ambulatory Visit (HOSPITAL_COMMUNITY): Payer: Self-pay

## 2023-12-09 ENCOUNTER — Other Ambulatory Visit: Payer: Self-pay

## 2024-01-06 ENCOUNTER — Ambulatory Visit (INDEPENDENT_AMBULATORY_CARE_PROVIDER_SITE_OTHER): Admitting: Internal Medicine

## 2024-01-06 ENCOUNTER — Encounter: Payer: Self-pay | Admitting: Internal Medicine

## 2024-01-06 ENCOUNTER — Other Ambulatory Visit (HOSPITAL_COMMUNITY): Payer: Self-pay

## 2024-01-06 ENCOUNTER — Telehealth (HOSPITAL_COMMUNITY): Payer: Self-pay

## 2024-01-06 VITALS — BP 120/70 | HR 67 | Temp 98.1°F | Ht 64.0 in | Wt 139.0 lb

## 2024-01-06 DIAGNOSIS — E785 Hyperlipidemia, unspecified: Secondary | ICD-10-CM | POA: Diagnosis not present

## 2024-01-06 DIAGNOSIS — L03011 Cellulitis of right finger: Secondary | ICD-10-CM | POA: Diagnosis not present

## 2024-01-06 DIAGNOSIS — E1169 Type 2 diabetes mellitus with other specified complication: Secondary | ICD-10-CM | POA: Diagnosis not present

## 2024-01-06 DIAGNOSIS — I10 Essential (primary) hypertension: Secondary | ICD-10-CM

## 2024-01-06 LAB — POCT GLYCOSYLATED HEMOGLOBIN (HGB A1C): HbA1c, POC (prediabetic range): 6.1 % (ref 5.7–6.4)

## 2024-01-06 MED ORDER — ACCU-CHEK FASTCLIX LANCETS MISC
1.0000 | Freq: Every day | 3 refills | Status: AC
  Filled 2024-01-06 – 2024-03-29 (×6): qty 102, 102d supply, fill #0

## 2024-01-06 MED ORDER — ONETOUCH ULTRA 2 W/DEVICE KIT
PACK | 0 refills | Status: AC
  Filled 2024-01-06 – 2024-02-10 (×3): qty 1, 30d supply, fill #0

## 2024-01-06 MED ORDER — DOXYCYCLINE HYCLATE 100 MG PO TABS
100.0000 mg | ORAL_TABLET | Freq: Two times a day (BID) | ORAL | 0 refills | Status: AC
  Filled 2024-01-06: qty 14, 7d supply, fill #0

## 2024-01-06 MED ORDER — ONETOUCH ULTRA BLUE TEST VI STRP
ORAL_STRIP | 3 refills | Status: AC
  Filled 2024-01-06 (×2): qty 100, 90d supply, fill #0
  Filled 2024-02-10: qty 100, 100d supply, fill #0
  Filled 2024-05-12: qty 100, 100d supply, fill #1

## 2024-01-06 NOTE — Progress Notes (Signed)
 Established Patient Office Visit     CC/Reason for Visit: Follow-up chronic conditions, right ring finger redness  HPI: Stacey Armstrong is a 89 y.o. female who is coming in today for the above mentioned reasons. Past Medical History is significant for: Type 2 diabetes, GERD, esophageal dysmotility with large diverticulum now on a liquid diet.  Over the past week the daughter has noted redness of her right ring finger.  Patient admits to picking at that finger at nighttime.  She is requesting a glucometer with supplies.   Past Medical/Surgical History: Past Medical History:  Diagnosis Date   Arthritis    Bronchitis    Complication of anesthesia    after gallbladder surgery with extubation had shallow respirations,  needed to continue intudation for additonal 10-12 days   Diabetes (HCC)    DVT (deep venous thrombosis) (HCC)    left leg after hysterectomy   GERD (gastroesophageal reflux disease)    History of COVID-19 05/2021   History of kidney stones    History of transient ischemic attack (TIA)    HOH (hard of hearing)    Hyperlipidemia    Hypertension    Kidney stones 2023   Sleep apnea     Past Surgical History:  Procedure Laterality Date   ABDOMINAL HYSTERECTOMY     APPENDECTOMY     CATARACT EXTRACTION, BILATERAL     CHOLECYSTECTOMY     COLONOSCOPY     CYSTOSCOPY WITH STENT PLACEMENT Right 08/14/2021   Procedure: CYSTOSCOPY WITH STENT PLACEMENT;  Surgeon: Cam Morene ORN, MD;  Location: [facility];  Service: Urology;  Laterality: Right;  ONLY NEEDS 30 MIN   EXTRACORPOREAL SHOCK WAVE LITHOTRIPSY Right 02/06/2022   Procedure: RIGHT EXTRACORPOREAL SHOCK WAVE LITHOTRIPSY (ESWL);  Surgeon: Cam Morene ORN, MD;  Location: [facility];  Service: Urology;  Laterality: Right;    Social History:  reports that she has quit smoking. She has never used smokeless tobacco. She reports that she does not currently use alcohol . She reports that she does not use  drugs.  Allergies: Allergies  Allergen Reactions   Nitrofurantoin Other (See Comments)    Drug-induced liver injury Aug 2024   Penicillins Hives    Other Reaction(s): Other (See Comments)  Unknown to pt daughter   Coconut Oil Hives, Swelling and Rash   Other Hives, Swelling and Rash    Berries    Sulfamethoxazole -Trimethoprim  Nausea And Vomiting and Nausea Only    Other Reaction(s): GI Intolerance    Family History:  Family History  Problem Relation Age of Onset   CAD Maternal Grandmother    Heart disease Maternal Grandmother    Pneumonia Maternal Grandfather    Liver disease Neg Hx    Colon cancer Neg Hx    Esophageal cancer Neg Hx    Rectal cancer Neg Hx    Stomach cancer Neg Hx      Current Outpatient Medications:    albuterol  (VENTOLIN  HFA) 108 (90 Base) MCG/ACT inhaler, Inhale 1 puff into the lungs every 6 (six) hours as needed for wheezing., Disp: , Rfl:    amLODipine  (NORVASC ) 10 MG tablet, Take 1 tablet (10 mg total) by mouth daily., Disp: 90 tablet, Rfl: 1   aspirin  EC 81 MG tablet, Take 81 mg by mouth in the morning., Disp: , Rfl:    bisoprolol  (ZEBETA ) 5 MG tablet, Take 1 tablet (5 mg total) by mouth daily., Disp: 90 tablet, Rfl: 1   Blood Glucose Monitoring Suppl (ACCU-CHEK  GUIDE ME) w/Device KIT, Use to test blood sugar once daily. Dx:e11.9, Disp: 1 kit, Rfl: 0   Cholecalciferol  (VITAMIN D3) 50 MCG (2000 UT) TABS, Take 2,000 Units by mouth in the morning., Disp: , Rfl:    doxycycline  (VIBRA -TABS) 100 MG tablet, Take 1 tablet (100 mg total) by mouth 2 (two) times daily for 7 days., Disp: 14 tablet, Rfl: 0   furosemide  (LASIX ) 20 MG tablet, Take 1 tablet (20 mg total) by mouth daily., Disp: 90 tablet, Rfl: 1   glucose blood (ACCU-CHEK GUIDE TEST) test strip, Use to test daily. Dx:e11.9, Disp: 100 each, Rfl: 3   ipratropium (ATROVENT ) 0.03 % nasal spray, Place 2 sprays into both nostrils 3 (three) times daily., Disp: , Rfl:    linaclotide  (LINZESS ) 290 MCG CAPS  capsule, Take 1 capsule (290 mcg total) by mouth daily., Disp: 90 capsule, Rfl: 1   loratadine  (CLARITIN ) 10 MG tablet, Take 1 tablet (10 mg total) by mouth daily., Disp: 90 tablet, Rfl: 1   Multiple Vitamins-Minerals (PRESERVISION AREDS 2 PO), Take 1 tablet by mouth in the morning and at bedtime., Disp: , Rfl:    Omega-3 Fatty Acids (FISH OIL) 1000 MG CAPS, Take 2,000 mg by mouth daily., Disp: , Rfl:    omeprazole  (PRILOSEC) 40 MG capsule, Take 1 capsule (40 mg total) by mouth 2 (two) times daily., Disp: 180 capsule, Rfl: 1   Polyethyl Glycol-Propyl Glycol (SYSTANE ULTRA) 0.4-0.3 % SOLN, Place 1-2 drops into both eyes 3 (three) times daily as needed (dry/irritated eyes.)., Disp: , Rfl:    polyethylene glycol powder (GLYCOLAX /MIRALAX ) 17 GM/SCOOP powder, Take 17 g by mouth 2 (two) times daily., Disp: 3350 g, Rfl: 1   potassium chloride  SA (KLOR-CON  M) 20 MEQ tablet, Take 1 tablet (20 mEq total) by mouth daily., Disp: 90 tablet, Rfl: 3   Probiotic Product (PROBIOTIC PO), Take 1 capsule by mouth daily., Disp: , Rfl:    psyllium (HYDROCIL/METAMUCIL) 95 % PACK, Take 1 packet by mouth daily., Disp: 240 each, Rfl: 3   Vibegron  (GEMTESA ) 75 MG TABS, Take 1 tablet (75 mg total) by mouth daily., Disp: 90 tablet, Rfl: 3   Accu-Chek FastClix Lancets MISC, Use one as directed daily., Disp: 102 each, Rfl: 3  Review of Systems:  Negative unless indicated in HPI.   Physical Exam: Vitals:   01/06/24 0905  BP: 120/70  Pulse: 67  Temp: 98.1 F (36.7 C)  TempSrc: Oral  SpO2: 96%  Weight: 139 lb (63 kg)  Height: 5' 4 (1.626 m)    Body mass index is 23.86 kg/m.   Physical Exam Vitals reviewed.  Constitutional:      Appearance: Normal appearance.  HENT:     Head: Normocephalic and atraumatic.  Eyes:     Conjunctiva/sclera: Conjunctivae normal.  Cardiovascular:     Rate and Rhythm: Normal rate and regular rhythm.  Pulmonary:     Effort: Pulmonary effort is normal.     Breath sounds: Normal  breath sounds.  Musculoskeletal:     Comments: Redness of distal phalanx of right ring finger concentrated around the cuticle line.  Skin:    General: Skin is warm and dry.  Neurological:     General: No focal deficit present.     Mental Status: She is alert and oriented to person, place, and time.  Psychiatric:        Mood and Affect: Mood normal.        Behavior: Behavior normal.  Thought Content: Thought content normal.        Judgment: Judgment normal.      Impression and Plan:  Type 2 diabetes mellitus with hyperlipidemia (HCC) -     POCT glycosylated hemoglobin (Hb A1C)  Essential hypertension  Paronychia of right ring finger -     Doxycycline Hyclate; Take 1 tablet (100 mg total) by mouth 2 (two) times daily for 7 days.  Dispense: 14 tablet; Refill: 0  Other orders -     Accu-Chek Guide Me; Use to test blood sugar once daily. Dx:e11.9  Dispense: 1 kit; Refill: 0 -     Accu-Chek FastClix Lancets; Use one as directed daily.  Dispense: 102 each; Refill: 3 -     Accu-Chek Guide Test; Use to test daily. Dx:e11.9  Dispense: 100 each; Refill: 3   - Diabetes is well-controlled with an A1c of 6.1. - Blood pressure is well-controlled on current. - Diabetes supplies will be sent. - She appears to have a paronychia of her right ring finger, will send doxycycline.  Time spent:30 minutes reviewing chart, interviewing and examining patient and formulating plan of care.     Tully Theophilus Andrews, MD [facility]

## 2024-01-07 ENCOUNTER — Other Ambulatory Visit: Payer: Self-pay

## 2024-01-07 ENCOUNTER — Other Ambulatory Visit (HOSPITAL_COMMUNITY): Payer: Self-pay

## 2024-01-07 NOTE — Telephone Encounter (Signed)
 Last filled on 12/09/23 as a 102 day supply, no PA needed at this time.

## 2024-01-11 ENCOUNTER — Other Ambulatory Visit (HOSPITAL_COMMUNITY): Payer: Self-pay

## 2024-01-13 ENCOUNTER — Other Ambulatory Visit (HOSPITAL_COMMUNITY): Payer: Self-pay

## 2024-01-14 ENCOUNTER — Other Ambulatory Visit (HOSPITAL_COMMUNITY): Payer: Self-pay

## 2024-01-14 ENCOUNTER — Other Ambulatory Visit: Payer: Self-pay | Admitting: Internal Medicine

## 2024-01-14 DIAGNOSIS — K5641 Fecal impaction: Secondary | ICD-10-CM

## 2024-01-14 DIAGNOSIS — R600 Localized edema: Secondary | ICD-10-CM

## 2024-01-14 MED ORDER — LINACLOTIDE 290 MCG PO CAPS
290.0000 ug | ORAL_CAPSULE | Freq: Every day | ORAL | 1 refills | Status: AC
Start: 2024-01-14 — End: ?
  Filled 2024-01-14: qty 90, 90d supply, fill #0
  Filled 2024-04-14: qty 90, 90d supply, fill #1

## 2024-01-14 MED ORDER — FUROSEMIDE 20 MG PO TABS
20.0000 mg | ORAL_TABLET | Freq: Every day | ORAL | 1 refills | Status: AC
  Filled 2024-01-14 – 2024-03-23 (×3): qty 90, 90d supply, fill #0
  Filled 2024-05-12 – 2024-06-06 (×3): qty 90, 90d supply, fill #1

## 2024-01-16 ENCOUNTER — Other Ambulatory Visit (HOSPITAL_COMMUNITY): Payer: Self-pay

## 2024-01-27 ENCOUNTER — Ambulatory Visit: Payer: Self-pay | Admitting: *Deleted

## 2024-01-27 NOTE — Telephone Encounter (Signed)
 FYI Only or Action Required?: FYI only for provider.  Patient was last seen in primary care on 01/06/2024 by Theophilus Andrews, Tully GRADE, MD.  Called Nurse Triage reporting Cyst.  Symptoms began about a month ago.  Interventions attempted: Nothing.  Symptoms are: gradually worsening.  Triage Disposition: See Physician Within 24 Hours  Patient/caregiver understands and will follow disposition?: Yes   Reason for Disposition  [1] Swelling is painful to touch AND [2] no fever  Answer Assessment - Initial Assessment Questions Patient daughter calling: not with patient  1. APPEARANCE of SWELLING: What does it look like?     Unsure- not seen by caller 2. SIZE: How large is the swelling? (e.g., inches, cm; or compare to size of pinhead, tip of pen, eraser, coin, pea, grape, ping pong ball)      unsure 3. LOCATION: Where is the swelling located?     Bottom- buttock, hurts to sit on chair 4. ONSET: When did the swelling start?     1 month ago 5. COLOR: What color is it? Is there more than one color?     unknown 6. PAIN: Is there any pain? If Yes, ask: How bad is the pain? (Scale 1-10; or mild, moderate, severe)       Painful to sit  8. CAUSE: What do you think caused the swelling?     Skin lump  Protocols used: Skin Lump or Localized Swelling-A-AH   Copied from CRM #[id number]. Topic: Clinical - Red Word Triage >> Jan 27, 2024 11:47 AM Mesmerise C wrote: [facility] that prompted transfer to Nurse Triage: Patient's daughter stated patient stated she has a lump on her bottom stated it's hard and uncomfortable and painful to sit, stated she noticed about a month ago

## 2024-01-28 ENCOUNTER — Other Ambulatory Visit: Payer: Self-pay | Admitting: Internal Medicine

## 2024-01-28 ENCOUNTER — Ambulatory Visit (INDEPENDENT_AMBULATORY_CARE_PROVIDER_SITE_OTHER): Admitting: Family Medicine

## 2024-01-28 ENCOUNTER — Encounter: Payer: Self-pay | Admitting: Family Medicine

## 2024-01-28 ENCOUNTER — Other Ambulatory Visit (HOSPITAL_COMMUNITY): Payer: Self-pay

## 2024-01-28 VITALS — BP 160/74 | HR 76 | Temp 98.0°F | Ht 64.0 in | Wt 141.4 lb

## 2024-01-28 DIAGNOSIS — L0231 Cutaneous abscess of buttock: Secondary | ICD-10-CM | POA: Diagnosis not present

## 2024-01-28 DIAGNOSIS — L03317 Cellulitis of buttock: Secondary | ICD-10-CM | POA: Diagnosis not present

## 2024-01-28 DIAGNOSIS — I10 Essential (primary) hypertension: Secondary | ICD-10-CM | POA: Diagnosis not present

## 2024-01-28 MED ORDER — DOXYCYCLINE HYCLATE 100 MG PO TABS
100.0000 mg | ORAL_TABLET | Freq: Two times a day (BID) | ORAL | 0 refills | Status: AC
  Filled 2024-01-28: qty 14, 7d supply, fill #0

## 2024-01-28 NOTE — Progress Notes (Signed)
 Established Patient Office Visit   Subjective  Patient ID: Stacey Armstrong, female    DOB: 1927-12-09  Age: 89 y.o. MRN: [id number]  Chief Complaint  Patient presents with   Acute Visit    Patient came in today for a knot on her buttocks, started 3 weeks ago, patient states it hurts to sit, rate of pain 7 out of 10, red bruise  Pt accompanied by her daughter.  Pt is a 89 yo female followed by Dr. Theophilus and seen for ongoing concern.  Pt with a sore area on R buttock x 3 wks.  Painful to sit.  7/10 pain.  Pt unable to control bowels and having loose stools.  Wearing depends.  Denies fever, chills, n/v.    Patient Active Problem List   Diagnosis Date Noted   Abnormal LFTs 01/08/2023   Chronic constipation 07/16/2022   Constipation 07/01/2022   Febrile illness, acute 06/29/2022   SIRS (systemic inflammatory response syndrome) (HCC) 06/29/2022   Stage 3b chronic kidney disease (CKD) (HCC) 06/29/2022   COPD (chronic obstructive pulmonary disease) (HCC) 06/29/2022   Acute on chronic diastolic CHF (congestive heart failure) (HCC) 03/12/2022   Acute respiratory failure with hypoxia (HCC) 03/09/2022   Bilateral pleural effusion 03/08/2022   Hypertensive urgency 03/08/2022   History of CVA (cerebrovascular accident) 03/08/2022   Acute respiratory failure (HCC) 09/07/2021   Acute viral bronchitis 09/06/2021   CKD (chronic kidney disease) stage 4, GFR 15-29 ml/min (HCC) 09/06/2021   Pulmonary nodule 09/06/2021   Weakness 09/06/2021   Bronchitis 09/05/2021   AKI (acute kidney injury) (HCC) 08/20/2021   Hyperlipidemia 08/20/2021   Hyponatremia 08/20/2021   Anemia in chronic renal disease 08/20/2021   Urolithiasis 08/20/2021   Mild protein malnutrition (HCC) 08/20/2021   Contact dermatitis 07/10/2020   Presbycusis of both ears 07/10/2020   Vasomotor rhinitis 07/10/2020   Fecal impaction (HCC) 12/07/2018   Type 2 diabetes mellitus with hyperlipidemia (HCC) 12/07/2018   Essential  hypertension 12/07/2018   Diarrhea 12/07/2018   Nausea and vomiting 12/07/2018   ARF (acute renal failure) (HCC) 12/07/2018   Past Medical History:  Diagnosis Date   Arthritis    Bronchitis    Complication of anesthesia    after gallbladder surgery with extubation had shallow respirations,  needed to continue intudation for additonal 10-12 days   Diabetes (HCC)    DVT (deep venous thrombosis) (HCC)    left leg after hysterectomy   GERD (gastroesophageal reflux disease)    History of COVID-19 05/2021   History of kidney stones    History of transient ischemic attack (TIA)    HOH (hard of hearing)    Hyperlipidemia    Hypertension    Kidney stones 2023   Sleep apnea    Past Surgical History:  Procedure Laterality Date   ABDOMINAL HYSTERECTOMY     APPENDECTOMY     CATARACT EXTRACTION, BILATERAL     CHOLECYSTECTOMY     COLONOSCOPY     CYSTOSCOPY WITH STENT PLACEMENT Right 08/14/2021   Procedure: CYSTOSCOPY WITH STENT PLACEMENT;  Surgeon: Cam Morene ORN, MD;  Location: [facility];  Service: Urology;  Laterality: Right;  ONLY NEEDS 30 MIN   EXTRACORPOREAL SHOCK WAVE LITHOTRIPSY Right 02/06/2022   Procedure: RIGHT EXTRACORPOREAL SHOCK WAVE LITHOTRIPSY (ESWL);  Surgeon: Cam Morene ORN, MD;  Location: [facility];  Service: Urology;  Laterality: Right;   Social History   Tobacco Use   Smoking status: Former   Smokeless tobacco: Never  Vaping  Use   Vaping status: Never Used  Substance Use Topics   Alcohol  use: Not Currently   Drug use: Never   Family History  Problem Relation Age of Onset   CAD Maternal Grandmother    Heart disease Maternal Grandmother    Pneumonia Maternal Grandfather    Liver disease Neg Hx    Colon cancer Neg Hx    Esophageal cancer Neg Hx    Rectal cancer Neg Hx    Stomach cancer Neg Hx    Allergies  Allergen Reactions   Nitrofurantoin Other (See Comments)    Drug-induced liver injury Aug 2024   Penicillins Hives    Other  Reaction(s): Other (See Comments)  Unknown to pt daughter   Coconut Oil Hives, Swelling and Rash   Other Hives, Swelling and Rash    Berries    Sulfamethoxazole -Trimethoprim  Nausea And Vomiting and Nausea Only    Other Reaction(s): GI Intolerance    ROS Negative unless stated above    Objective:     BP (!) 160/74 (BP Location: Left Arm, Patient Position: Sitting, Cuff Size: Normal)   Pulse 76   Temp 98 F (36.7 C) (Oral)   Ht 5' 4 (1.626 m)   Wt 141 lb 6.4 oz (64.1 kg)   SpO2 98%   BMI 24.27 kg/m  BP Readings from Last 3 Encounters:  01/28/24 (!) 160/74  01/06/24 120/70  06/24/23 120/64   Wt Readings from Last 3 Encounters:  01/28/24 141 lb 6.4 oz (64.1 kg)  01/06/24 139 lb (63 kg)  06/29/23 146 lb (66.2 kg)      Physical Exam Skin:    General: Skin is warm.         Comments: TTP of R medial lower buttock with a large 6 cm x 4 cm indurated area of erythema, ecchymosis with a 3mm pustule forming.     No results found for any visits on 01/28/24.    Assessment & Plan:   Cellulitis and abscess of buttock -     Doxycycline  Hyclate; Take 1 tablet (100 mg total) by mouth 2 (two) times daily for 7 days.  Dispense: 14 tablet; Refill: 0  Essential hypertension  Formation of abscess of R buttock with cellulitis.  Important to keep area clean and dry.  Warm compresses QID.  Start abx.  Allergies reviewed.  Advised if area does not begin draining will need I&D.  Given strict precautions.  Bp elevate.  Possibly 2/2 pain.  Lifestyle modifications.  Continue current meds.  F/u with pcp.  Return if symptoms worsen or fail to improve.   Stacey JONELLE Single, MD

## 2024-01-29 ENCOUNTER — Other Ambulatory Visit (HOSPITAL_COMMUNITY): Payer: Self-pay

## 2024-01-29 ENCOUNTER — Other Ambulatory Visit: Payer: Self-pay

## 2024-01-29 ENCOUNTER — Ambulatory Visit (INDEPENDENT_AMBULATORY_CARE_PROVIDER_SITE_OTHER)

## 2024-01-29 VITALS — BP 120/60 | HR 69 | Temp 97.6°F | Ht 64.0 in | Wt 139.7 lb

## 2024-01-29 DIAGNOSIS — Z0000 Encounter for general adult medical examination without abnormal findings: Secondary | ICD-10-CM | POA: Diagnosis not present

## 2024-01-29 NOTE — Progress Notes (Signed)
 Subjective:   Stacey Armstrong is a 89 y.o. who presents for a Medicare Wellness preventive visit.  As a reminder, Annual Wellness Visits don't include a physical exam, and some assessments may be limited, especially if this visit is performed virtually. We may recommend an in-person follow-up visit with your provider if needed.  Visit Complete: In person    Persons Participating in Visit: Patient.  AWV Questionnaire: No: Patient Medicare AWV questionnaire was not completed prior to this visit.  Cardiac Risk Factors include: advanced age (>31men, >20 women);diabetes mellitus;hypertension     Objective:    Today's Vitals   01/29/24 1502  BP: 120/60  Pulse: 69  Temp: 97.6 F (36.4 C)  TempSrc: Oral  SpO2: 97%  Weight: 139 lb 11.2 oz (63.4 kg)  Height: 5' 4 (1.626 m)   Body mass index is 23.98 kg/m.     01/29/2024    3:28 PM 06/29/2023    5:12 PM 01/08/2023   12:55 AM 06/29/2022    6:00 AM 03/09/2022    1:00 PM 03/08/2022    3:56 PM 02/06/2022    9:52 AM  Advanced Directives  Does Patient Have a Medical Advance Directive? Yes Yes No No Yes Yes Yes  Type of Estate agent of [place];Living will    Living will Living will;Healthcare Power of Attorney Living will  Does patient want to make changes to medical advance directive? No - Patient declined    No - Patient declined  No - Patient declined  Copy of Healthcare Power of Attorney in Chart? Yes - validated most recent copy scanned in chart (See row information)        Would patient like information on creating a medical advance directive?    No - Patient declined       Current Medications (verified) Outpatient Encounter Medications as of 01/29/2024  Medication Sig   Accu-Chek FastClix Lancets MISC Use one as directed daily to test blood glucose.   albuterol  (VENTOLIN  HFA) 108 (90 Base) MCG/ACT inhaler Inhale 1 puff into the lungs every 6 (six) hours as needed for wheezing.   amLODipine  (NORVASC ) 10 MG  tablet Take 1 tablet (10 mg total) by mouth daily.   aspirin  EC 81 MG tablet Take 81 mg by mouth in the morning.   bisoprolol  (ZEBETA ) 5 MG tablet Take 1 tablet (5 mg total) by mouth daily.   Blood Glucose Monitoring Suppl (ONE TOUCH ULTRA 2) w/Device KIT Use to test blood glucose once daily.   Cholecalciferol  (VITAMIN D3) 50 MCG (2000 UT) TABS Take 2,000 Units by mouth in the morning.   doxycycline  (VIBRA -TABS) 100 MG tablet Take 1 tablet (100 mg total) by mouth 2 (two) times daily for 7 days.   furosemide  (LASIX ) 20 MG tablet Take 1 tablet (20 mg total) by mouth daily.   glucose blood (ONETOUCH ULTRA BLUE TEST) test strip Use to test once daily.   ipratropium (ATROVENT ) 0.03 % nasal spray Place 2 sprays into both nostrils 3 (three) times daily.   linaclotide  (LINZESS ) 290 MCG CAPS capsule Take 1 capsule (290 mcg total) by mouth daily.   Multiple Vitamins-Minerals (PRESERVISION AREDS 2 PO) Take 1 tablet by mouth in the morning and at bedtime.   omeprazole  (PRILOSEC) 40 MG capsule Take 1 capsule (40 mg total) by mouth 2 (two) times daily.   Polyethyl Glycol-Propyl Glycol (SYSTANE ULTRA) 0.4-0.3 % SOLN Place 1-2 drops into both eyes 3 (three) times daily as needed (dry/irritated eyes.).   polyethylene  glycol powder (GLYCOLAX /MIRALAX ) 17 GM/SCOOP powder Take 17 g by mouth 2 (two) times daily.   potassium chloride  SA (KLOR-CON  M) 20 MEQ tablet Take 1 tablet (20 mEq total) by mouth daily.   Probiotic Product (PROBIOTIC PO) Take 1 capsule by mouth daily.   psyllium (HYDROCIL/METAMUCIL) 95 % PACK Take 1 packet by mouth daily.   Vibegron  (GEMTESA ) 75 MG TABS Take 1 tablet (75 mg total) by mouth daily.   No facility-administered encounter medications on file as of 01/29/2024.    Allergies (verified) Nitrofurantoin, Penicillins, Coconut oil, Other, and Sulfamethoxazole -trimethoprim    History: Past Medical History:  Diagnosis Date   Arthritis    Bronchitis    Complication of anesthesia    after  gallbladder surgery with extubation had shallow respirations,  needed to continue intudation for additonal 10-12 days   Diabetes (HCC)    DVT (deep venous thrombosis) (HCC)    left leg after hysterectomy   GERD (gastroesophageal reflux disease)    History of COVID-19 05/2021   History of kidney stones    History of transient ischemic attack (TIA)    HOH (hard of hearing)    Hyperlipidemia    Hypertension    Kidney stones 2023   Sleep apnea    Past Surgical History:  Procedure Laterality Date   ABDOMINAL HYSTERECTOMY     APPENDECTOMY     CATARACT EXTRACTION, BILATERAL     CHOLECYSTECTOMY     COLONOSCOPY     CYSTOSCOPY WITH STENT PLACEMENT Right 08/14/2021   Procedure: CYSTOSCOPY WITH STENT PLACEMENT;  Surgeon: Cam Morene ORN, MD;  Location: [facility];  Service: Urology;  Laterality: Right;  ONLY NEEDS 30 MIN   EXTRACORPOREAL SHOCK WAVE LITHOTRIPSY Right 02/06/2022   Procedure: RIGHT EXTRACORPOREAL SHOCK WAVE LITHOTRIPSY (ESWL);  Surgeon: Cam Morene ORN, MD;  Location: [facility];  Service: Urology;  Laterality: Right;   Family History  Problem Relation Age of Onset   CAD Maternal Grandmother    Heart disease Maternal Grandmother    Pneumonia Maternal Grandfather    Liver disease Neg Hx    Colon cancer Neg Hx    Esophageal cancer Neg Hx    Rectal cancer Neg Hx    Stomach cancer Neg Hx    Social History   Socioeconomic History   Marital status: Widowed    Spouse name: Not on file   Number of children: 4   Years of education: Not on file   Highest education level: Not on file  Occupational History   Occupation: retred  Tobacco Use   Smoking status: Former   Smokeless tobacco: Never  Vaping Use   Vaping status: Never Used  Substance and Sexual Activity   Alcohol  use: Not Currently   Drug use: Never   Sexual activity: Not Currently    Birth control/protection: Surgical  Other Topics Concern   Not on file  Social History Narrative   Not on  file   Social Drivers of Health   Financial Resource Strain: Low Risk  (01/29/2024)   Overall Financial Resource Strain (CARDIA)    Difficulty of Paying Living Expenses: Not hard at all  Food Insecurity: No Food Insecurity (01/29/2024)   Hunger Vital Sign    Worried About Running Out of Food in the Last Year: Never true    Ran Out of Food in the Last Year: Never true  Transportation Needs: No Transportation Needs (01/29/2024)   PRAPARE - Administrator, Civil Service (Medical): No  Lack of Transportation (Non-Medical): No  Physical Activity: Inactive (01/29/2024)   Exercise Vital Sign    Days of Exercise per Week: 0 days    Minutes of Exercise per Session: 0 min  Stress: No Stress Concern Present (01/29/2024)   [employer] of Occupational Health - Occupational Stress Questionnaire    Feeling of Stress: Not at all  Social Connections: Moderately Integrated (01/29/2024)   Social Connection and Isolation Panel    Frequency of Communication with Friends and Family: More than three times a week    Frequency of Social Gatherings with Friends and Family: More than three times a week    Attends Religious Services: More than 4 times per year    Active Member of [employer] or Organizations: Yes    Attends Banker Meetings: More than 4 times per year    Marital Status: Widowed    Tobacco Counseling Counseling given: Not Answered    Clinical Intake:  Pre-visit preparation completed: Yes  Pain : No/denies pain     BMI - recorded: 23.98 Nutritional Status: BMI of 19-24  Normal Nutritional Risks: None Diabetes: Yes CBG done?: Yes (CBG 130 Per patient) CBG resulted in Enter/ Edit results?: Yes Did pt. bring in CBG monitor from home?: No  Lab Results  Component Value Date   HGBA1C 6.1 01/06/2024   HGBA1C 6.3 (A) 06/24/2023   HGBA1C 6.5 (A) 10/29/2022     How often do you need to have someone help you when you read instructions, pamphlets, or other written  materials from your doctor or pharmacy?: 2 - Rarely (Daughter assist)  Interpreter Needed?: No  Information entered by :: Rojelio Blush LPN   Activities of Daily Living      01/29/2024    3:23 PM  In your present state of health, do you have any difficulty performing the following activities:  Hearing? 1  Comment Wears Hearing Aids  Vision? 0  Difficulty concentrating or making decisions? 0  Walking or climbing stairs? 1  Comment Uses a Walker  Dressing or bathing? 0  Doing errands, shopping? 1  Comment Daughter Ship broker and eating ? N  Using the Toilet? N  In the past six months, have you accidently leaked urine? Y  Comment Wears Breifs. Followed br Urologist  Do you have problems with loss of bowel control? Y  Comment Wears Breifs. Followed by Gastrologist  Managing your Medications? N  Comment Daughter assist  Managing your Finances? N  Housekeeping or managing your Housekeeping? N    Patient Care Team: Theophilus Andrews, Tully GRADE, MD as PCP - General (Internal Medicine)   I have updated your Care Teams any recent Medical Services you may have received from other providers in the past year.     Assessment:   This is a routine wellness examination for Jocabed.  Hearing/Vision screen Hearing Screening - Comments:: Wears Hearing Aids Vision Screening - Comments:: Wears rx glasses - up to date with routine eye exams with  [facility]   Goals Addressed               This Visit's Progress     Increase physical activity (pt-stated)        Stay active and continue losing weight.       Depression Screen      01/29/2024    3:03 PM 06/29/2023    5:12 PM 06/24/2023   11:39 AM 04/16/2023    1:10 PM 01/27/2023   10:17  AM 10/29/2022   11:29 AM 07/14/2022    4:45 PM  PHQ 2/9 Scores  PHQ - 2 Score 0 0 1 0 0  1  PHQ- 9 Score   8    2  Exception Documentation      Patient refusal     Fall Risk      01/29/2024    3:26 PM 06/29/2023    5:12 PM  06/24/2023   11:39 AM 04/16/2023    1:10 PM 01/27/2023   10:17 AM  Fall Risk   Falls in the past year? 0 0 0 0 0  Number falls in past yr: 0  0 0 0  Injury with Fall? 0  0 0 0  Risk for fall due to : No Fall Risks      Follow up Falls evaluation completed  Falls evaluation completed Falls evaluation completed Falls evaluation completed    MEDICARE RISK AT HOME:   Medicare Risk at Home Any stairs in or around the home?: No If so, are there any without handrails?: No Home free of loose throw rugs in walkways, pet beds, electrical cords, etc?: Yes Adequate lighting in your home to reduce risk of falls?: Yes Life alert?: No Use of a cane, walker or w/c?: Yes Grab bars in the bathroom?: Yes Shower chair or bench in shower?: Yes Elevated toilet seat or a handicapped toilet?: No  TIMED UP AND GO:  Was the test performed?  Yes  Length of time to ambulate 10 feet: 10 sec Gait slow and steady with assistive device  Cognitive Function: 6CIT completed        01/29/2024    3:29 PM  6CIT Screen  What Year? 0 points  What month? 0 points  What time? 0 points  Count back from 20 0 points  Months in reverse 0 points  Repeat phrase 0 points  Total Score 0 points    Immunizations Immunization History  Administered Date(s) Administered   Fluad Quad(high Dose 65+) 04/10/2020, 01/30/2021, 02/11/2022   Fluad Trivalent(High Dose 65+) 01/27/2023   INFLUENZA, HIGH DOSE SEASONAL PF 02/15/2019   Moderna Sars-Covid-2 Vaccination 05/25/2019, 06/22/2019, 04/13/2020, 10/11/2020, 04/28/2022, 02/17/2023   Pneumococcal Conjugate-13 02/15/2019   Pneumococcal Polysaccharide-23 01/11/2020   Tdap 03/01/2021   Zoster Recombinant(Shingrix) 03/01/2021    Screening Tests Health Maintenance  Topic Date Due   FOOT EXAM  Never done   DEXA SCAN  Never done   Zoster Vaccines- Shingrix (2 of 2) 04/26/2021   Influenza Vaccine  12/25/2023   COVID-19 Vaccine (7 - 2025-26 season) 01/25/2024   HEMOGLOBIN  A1C  07/08/2024   OPHTHALMOLOGY EXAM  08/06/2024   Medicare Annual Wellness (AWV)  01/28/2025   DTaP/Tdap/Td (2 - Td or Tdap) 03/02/2031   Pneumococcal Vaccine: 50+ Years  Completed   HPV VACCINES  Aged Out   Meningococcal B Vaccine  Aged Out    Health Maintenance Items Addressed:   Additional Screening:  Vision Screening: Recommended annual ophthalmology exams for early detection of glaucoma and other disorders of the eye. Is the patient up to date with their annual eye exam?  No  Who is the provider or what is the name of the office in which the patient attends annual eye exams? [facility]  Dental Screening: Recommended annual dental exams for proper oral hygiene  Community Resource Referral / Chronic Care Management: CRR required this visit?  No   CCM required this visit?  No   Plan:    I  have personally reviewed and noted the following in the patient's chart:   Medical and social history Use of alcohol , tobacco or illicit drugs  Current medications and supplements including opioid prescriptions. Patient is not currently taking opioid prescriptions. Functional ability and status Nutritional status Physical activity Advanced directives List of other physicians Hospitalizations, surgeries, and ER visits in previous 12 months Vitals Screenings to include cognitive, depression, and falls Referrals and appointments  In addition, I have reviewed and discussed with patient certain preventive protocols, quality metrics, and best practice recommendations. A written personalized care plan for preventive services as well as general preventive health recommendations were provided to patient.   Rojelio LELON Blush, LPN   0/08/7972   After Visit Summary: (In Person-Printed) AVS printed and given to the patient  Notes: Nothing significant to report at this time.

## 2024-01-29 NOTE — Patient Instructions (Addendum)
 Stacey Armstrong,  Thank you for taking the time for your Medicare Wellness Visit. I appreciate your continued commitment to your health goals. Please review the care plan we discussed, and feel free to reach out if I can assist you further.  Medicare recommends these wellness visits once per year to help you and your care team stay ahead of potential health issues. These visits are designed to focus on prevention, allowing your provider to concentrate on managing your acute and chronic conditions during your regular appointments.  Please note that Annual Wellness Visits do not include a physical exam. Some assessments may be limited, especially if the visit was conducted virtually. If needed, we may recommend a separate in-person follow-up with your provider.  Ongoing Care Seeing your primary care provider every 3 to 6 months helps us  monitor your health and provide consistent, personalized care.   Referrals If a referral was made during today's visit and you haven't received any updates within two weeks, please contact the referred provider directly to check on the status.  Recommended Screenings:  Health Maintenance  Topic Date Due   Complete foot exam   Never done   DEXA scan (bone density measurement)  Never done   Zoster (Shingles) Vaccine (2 of 2) 04/26/2021   Flu Shot  12/25/2023   COVID-19 Vaccine (7 - 2025-26 season) 01/25/2024   Hemoglobin A1C  07/08/2024   Eye exam for diabetics  08/06/2024   Medicare Annual Wellness Visit  01/28/2025   DTaP/Tdap/Td vaccine (2 - Td or Tdap) 03/02/2031   Pneumococcal Vaccine for age over 102  Completed   HPV Vaccine  Aged Out   Meningitis B Vaccine  Aged Out       01/29/2024    3:28 PM  Advanced Directives  Does Patient Have a Medical Advance Directive? Yes  Type of Estate agent of [place];Living will  Does patient want to make changes to medical advance directive? No - Patient declined  Copy of Healthcare Power of  Attorney in Chart? Yes - validated most recent copy scanned in chart (See row information)   Advance Care Planning is important because it: Ensures you receive medical care that aligns with your values, goals, and preferences. Provides guidance to your family and loved ones, reducing the emotional burden of decision-making during critical moments.  Vision: Annual vision screenings are recommended for early detection of glaucoma, cataracts, and diabetic retinopathy. These exams can also reveal signs of chronic conditions such as diabetes and high blood pressure.  Dental: Annual dental screenings help detect early signs of oral cancer, gum disease, and other conditions linked to overall health, including heart disease and diabetes.  Please see the attached documents for additional preventive care recommendations.

## 2024-02-01 ENCOUNTER — Other Ambulatory Visit (HOSPITAL_COMMUNITY): Payer: Self-pay

## 2024-02-01 MED ORDER — OMEPRAZOLE 40 MG PO CPDR
40.0000 mg | DELAYED_RELEASE_CAPSULE | Freq: Two times a day (BID) | ORAL | 1 refills | Status: AC
  Filled 2024-02-01: qty 180, 90d supply, fill #0
  Filled 2024-05-12: qty 180, 90d supply, fill #1

## 2024-02-05 ENCOUNTER — Other Ambulatory Visit (HOSPITAL_COMMUNITY): Payer: Self-pay

## 2024-02-08 ENCOUNTER — Other Ambulatory Visit (HOSPITAL_COMMUNITY): Payer: Self-pay

## 2024-02-09 ENCOUNTER — Ambulatory Visit: Payer: Self-pay

## 2024-02-09 NOTE — Telephone Encounter (Signed)
 FYI Only or Action Required?: FYI only for provider.  Patient was last seen in primary care on 01/28/2024 by Mercer Clotilda SAUNDERS, MD.  Called Nurse Triage reporting Cellulitis.  Symptoms began several weeks ago.  Interventions attempted: Prescription medications: finished course of abx.  Symptoms are: unchanged.  Triage Disposition: See Physician Within 24 Hours  Patient/caregiver understands and will follow disposition?: Yes      Copied from CRM #[id number]. Topic: Clinical - Red Word Triage >> Feb 09, 2024  5:04 PM Winona R wrote: Pt daughter calling to schedule her mom a appointment for a sore on her buttocks. She states it is healing but still seems like its infected and in pain Reason for Disposition  [1] Looks infected (e.g., spreading redness, pus) AND [2] no fever  Answer Assessment - Initial Assessment Questions 1. LOCATION: Where is the wound located?      buttock 2. WOUND APPEARANCE: What does the wound look like?      A lump underneath that's hard 3. SIZE: If redness is present, ask: What is the size of the red area? (Inches, centimeters, or compare to size of a coin)      Size of quarter 4. SPREAD: What's changed in the last day?  Do you see any red streaks coming from the wound?     Site of sore is discolored 5. ONSET: When did it start to look infected?      A few weeks, worsened this past week 6. MECHANISM: How did the wound start, what was the cause?     *No Answer* 7. PAIN: Do you have any pain?  If Yes, ask: How bad is the pain?  (e.g., Scale 1-10; mild, moderate, or severe)     Bothersome when sitting 8. FEVER: Do you have a fever? If Yes, ask: What is your temperature, how was it measured, and when did it start?     denies 9. OTHER SYMPTOMS: Do you have any other symptoms? (e.g., shaking chills, weakness, rash elsewhere on body)     Endorses increased fatigue 10. PREGNANCY: Is there any chance you are pregnant? When was your last  menstrual period?       N/a  Protocols used: Wound Infection Suspected-A-AH

## 2024-02-10 ENCOUNTER — Other Ambulatory Visit (HOSPITAL_COMMUNITY): Payer: Self-pay

## 2024-02-10 ENCOUNTER — Encounter: Payer: Self-pay | Admitting: Internal Medicine

## 2024-02-10 ENCOUNTER — Encounter: Admitting: Internal Medicine

## 2024-02-10 MED ORDER — TRUE METRIX BLOOD GLUCOSE TEST VI STRP
1.0000 | ORAL_STRIP | Freq: Every day | 3 refills | Status: DC
  Filled 2024-02-10: qty 100, 100d supply, fill #0

## 2024-02-10 NOTE — Telephone Encounter (Signed)
 Noted

## 2024-02-10 NOTE — Progress Notes (Signed)
 This encounter was created in error - please disregard.

## 2024-02-12 ENCOUNTER — Other Ambulatory Visit (HOSPITAL_COMMUNITY): Payer: Self-pay

## 2024-02-15 ENCOUNTER — Other Ambulatory Visit (HOSPITAL_COMMUNITY): Payer: Self-pay

## 2024-03-01 ENCOUNTER — Ambulatory Visit: Payer: Self-pay

## 2024-03-01 NOTE — Telephone Encounter (Signed)
 FYI Only or Action Required?: FYI only for provider.  Patient was last seen in primary care on 02/10/2024 by Theophilus Andrews, Tully GRADE, MD.  Called Nurse Triage reporting Abrasion.  Symptoms began several weeks ago.  Interventions attempted: Prescription medications: Doxycycline  and Other: Warm moist compresses.  Symptoms are: gradually worsening.  Triage Disposition: See Physician Within 24 Hours  Patient/caregiver understands and will follow disposition?: Yes  Copied from CRM #[id number]. Topic: Clinical - Red Word Triage >> Mar 01, 2024 11:54 AM [place] A wrote: [facility] that prompted transfer to Nurse Triage: Patient's daughter calling because patient has wound on back that is causing pain. Has been a few weeks  Reason for Disposition  [1] Looks infected (e.g., spreading redness, pus) AND [2] no fever  Answer Assessment - Initial Assessment Questions Spoke with pts daughter Vina Justice(on DPR). Onset of closed wound several months ago, worsening pain and wound size over past 2 weeks. Saw Dr. Mercer on 9/4, received rx for doxycycline  with instructions to keep area clean and dry and apply warm compresses to area. Symptoms worsening.  1. APPEARANCE What does the injury look like?      Red, closed. Skin around the wound red and irritated.   2. ONSET: How long ago did the injury occur?      2 months, worsening 2 past weeks.  3. LOCATION: Where is the injury located?      Close to gluteal crack, right side  4. SIZE: How large is the cut?      Quarter sized, close to gluteal crack  5. BLEEDING: Is it bleeding now? If Yes, ask: Is it difficult to stop?      No bleeding.  6. PAIN: Is there any pain? If Yes, ask: How bad is the pain? (Scale 0-10; or none, mild, moderate, severe)     Severe pain. Not taking pain medicine.  7. MECHANISM: Tell me how it happened.      Sitting long periods of time on chair with poor cushioning, rubbing from clothing.  8. OTHER  SYMPTOMS:     Pt reports having fever and chills, dizzy and lightheaded last Thursday, has resolved. Pt not able to recall exact temperature reading.  Protocols used: Skin Injury-A-AH

## 2024-03-01 NOTE — Telephone Encounter (Signed)
 Noted

## 2024-03-02 ENCOUNTER — Encounter: Payer: Self-pay | Admitting: Internal Medicine

## 2024-03-02 ENCOUNTER — Ambulatory Visit (INDEPENDENT_AMBULATORY_CARE_PROVIDER_SITE_OTHER): Admitting: Internal Medicine

## 2024-03-02 VITALS — BP 112/64 | HR 59 | Temp 98.2°F | Wt 139.1 lb

## 2024-03-02 DIAGNOSIS — L0231 Cutaneous abscess of buttock: Secondary | ICD-10-CM | POA: Diagnosis not present

## 2024-03-02 NOTE — Progress Notes (Signed)
 Established Patient Office Visit     CC/Reason for Visit: Follow-up right gluteal abscess  HPI: Stacey Armstrong is a 89 y.o. female who is coming in today for the above mentioned reasons.  Was evaluated for the same on September 4 by Dr. Mercer.  Was advised warm compresses and was given a course of doxycycline .  Abscess has not drained spontaneously.  It is painful when she sits.  She does not have any systemic symptoms.   Past Medical/Surgical History: Past Medical History:  Diagnosis Date   Arthritis    Bronchitis    Complication of anesthesia    after gallbladder surgery with extubation had shallow respirations,  needed to continue intudation for additonal 10-12 days   Diabetes (HCC)    DVT (deep venous thrombosis) (HCC)    left leg after hysterectomy   GERD (gastroesophageal reflux disease)    History of COVID-19 05/2021   History of kidney stones    History of transient ischemic attack (TIA)    HOH (hard of hearing)    Hyperlipidemia    Hypertension    Kidney stones 2023   Sleep apnea     Past Surgical History:  Procedure Laterality Date   ABDOMINAL HYSTERECTOMY     APPENDECTOMY     CATARACT EXTRACTION, BILATERAL     CHOLECYSTECTOMY     COLONOSCOPY     CYSTOSCOPY WITH STENT PLACEMENT Right 08/14/2021   Procedure: CYSTOSCOPY WITH STENT PLACEMENT;  Surgeon: Cam Morene ORN, MD;  Location: [facility];  Service: Urology;  Laterality: Right;  ONLY NEEDS 30 MIN   EXTRACORPOREAL SHOCK WAVE LITHOTRIPSY Right 02/06/2022   Procedure: RIGHT EXTRACORPOREAL SHOCK WAVE LITHOTRIPSY (ESWL);  Surgeon: Cam Morene ORN, MD;  Location: [facility];  Service: Urology;  Laterality: Right;    Social History:  reports that she has quit smoking. She has never used smokeless tobacco. She reports that she does not currently use alcohol . She reports that she does not use drugs.  Allergies: Allergies  Allergen Reactions   Nitrofurantoin Other (See Comments)     Drug-induced liver injury Aug 2024   Penicillins Hives    Other Reaction(s): Other (See Comments)  Unknown to pt daughter   Coconut Oil Hives, Swelling and Rash   Other Hives, Swelling and Rash    Berries    Sulfamethoxazole -Trimethoprim  Nausea And Vomiting and Nausea Only    Other Reaction(s): GI Intolerance    Family History:  Family History  Problem Relation Age of Onset   CAD Maternal Grandmother    Heart disease Maternal Grandmother    Pneumonia Maternal Grandfather    Liver disease Neg Hx    Colon cancer Neg Hx    Esophageal cancer Neg Hx    Rectal cancer Neg Hx    Stomach cancer Neg Hx      Current Outpatient Medications:    Accu-Chek FastClix Lancets MISC, Use one as directed daily to test blood glucose., Disp: 102 each, Rfl: 3   amLODipine  (NORVASC ) 10 MG tablet, Take 1 tablet (10 mg total) by mouth daily., Disp: 90 tablet, Rfl: 1   aspirin  EC 81 MG tablet, Take 81 mg by mouth in the morning., Disp: , Rfl:    bisoprolol  (ZEBETA ) 5 MG tablet, Take 1 tablet (5 mg total) by mouth daily., Disp: 90 tablet, Rfl: 1   Blood Glucose Monitoring Suppl (ONE TOUCH ULTRA 2) w/Device KIT, Use to test blood glucose once daily., Disp: 1 kit, Rfl: 0  Cholecalciferol  (VITAMIN D3) 50 MCG (2000 UT) TABS, Take 2,000 Units by mouth in the morning., Disp: , Rfl:    furosemide  (LASIX ) 20 MG tablet, Take 1 tablet (20 mg total) by mouth daily., Disp: 90 tablet, Rfl: 1   glucose blood (ONETOUCH ULTRA BLUE TEST) test strip, Use to test once daily., Disp: 100 each, Rfl: 3   glucose blood (TRUE METRIX BLOOD GLUCOSE TEST) test strip, Use to check blood sugar once daily., Disp: 100 each, Rfl: 3   ipratropium (ATROVENT ) 0.03 % nasal spray, Place 2 sprays into both nostrils 3 (three) times daily., Disp: , Rfl:    linaclotide  (LINZESS ) 290 MCG CAPS capsule, Take 1 capsule (290 mcg total) by mouth daily., Disp: 90 capsule, Rfl: 1   omeprazole  (PRILOSEC) 40 MG capsule, Take 1 capsule (40 mg total) by mouth  2 (two) times daily., Disp: 180 capsule, Rfl: 1   Polyethyl Glycol-Propyl Glycol (SYSTANE ULTRA) 0.4-0.3 % SOLN, Place 1-2 drops into both eyes 3 (three) times daily as needed (dry/irritated eyes.)., Disp: , Rfl:    polyethylene glycol powder (GLYCOLAX /MIRALAX ) 17 GM/SCOOP powder, Take 17 g by mouth 2 (two) times daily., Disp: 3350 g, Rfl: 1   potassium chloride  SA (KLOR-CON  M) 20 MEQ tablet, Take 1 tablet (20 mEq total) by mouth daily., Disp: 90 tablet, Rfl: 3   Probiotic Product (PROBIOTIC PO), Take 1 capsule by mouth daily., Disp: , Rfl:    psyllium (HYDROCIL/METAMUCIL) 95 % PACK, Take 1 packet by mouth daily., Disp: 240 each, Rfl: 3   Vibegron  (GEMTESA ) 75 MG TABS, Take 1 tablet (75 mg total) by mouth daily., Disp: 90 tablet, Rfl: 3  Review of Systems:  Negative unless indicated in HPI.   Physical Exam: Vitals:   03/02/24 1115  BP: 112/64  Pulse: (!) 59  Temp: 98.2 F (36.8 C)  TempSrc: Oral  SpO2: 97%  Weight: 139 lb 1.6 oz (63.1 kg)    Body mass index is 23.88 kg/m.    Impression and Plan:  Abscess of right buttock -     Ambulatory referral to General Surgery  -I see no need for further antibiotic therapy, it likely needs to be incised and drained, will send referral to general surgery.   Time spent:22 minutes reviewing chart, interviewing and examining patient and formulating plan of care.     Tully Theophilus Andrews, MD [facility]

## 2024-03-10 DIAGNOSIS — L0231 Cutaneous abscess of buttock: Secondary | ICD-10-CM | POA: Diagnosis not present

## 2024-03-23 ENCOUNTER — Other Ambulatory Visit (HOSPITAL_COMMUNITY): Payer: Self-pay

## 2024-03-24 ENCOUNTER — Other Ambulatory Visit (HOSPITAL_COMMUNITY): Payer: Self-pay

## 2024-03-25 ENCOUNTER — Other Ambulatory Visit (HOSPITAL_COMMUNITY): Payer: Self-pay

## 2024-03-25 MED ORDER — DOXYCYCLINE HYCLATE 100 MG PO TABS
100.0000 mg | ORAL_TABLET | Freq: Two times a day (BID) | ORAL | 0 refills | Status: AC
  Filled 2024-03-25: qty 10, 5d supply, fill #0

## 2024-03-26 ENCOUNTER — Other Ambulatory Visit (HOSPITAL_COMMUNITY): Payer: Self-pay

## 2024-03-26 ENCOUNTER — Other Ambulatory Visit: Payer: Self-pay

## 2024-03-29 ENCOUNTER — Other Ambulatory Visit (HOSPITAL_COMMUNITY): Payer: Self-pay

## 2024-04-05 ENCOUNTER — Telehealth: Payer: Self-pay | Admitting: Gastroenterology

## 2024-04-05 NOTE — Telephone Encounter (Signed)
 Patient's daughter called stating patient has not had a bowel movements in 4-5 days. Requesting a call to discuss options to help. Please advise, thank you

## 2024-04-06 NOTE — Telephone Encounter (Signed)
 Pts daughter states her mother called yesterday and was concerned as she had not had a BM in 3 days. Reports she has taken the linzess  290 and takes 1-2 doses of miralax . Discussed pt doing a miralax  purge but daughter states she cannot get that amount of liquids down. She also states she cannot swallow pills she has to crush what ever she takes. Daughter wants to know what Dr. Leigh recommends. Please advise.

## 2024-04-06 NOTE — Telephone Encounter (Signed)
 Discussed recommendations per Dr. Leigh. Daughter states her mom was going to try an enema today. She will see if it worked and then discuss another enema and the increased doses of miralax . Reports her mother cannot do the purge as she cannnot drink all the liquid.Reports if this does not work she will call back. Reports she will have to stay there over night as her mother has no control over her bowels and her house would be a mess with poop all over. States they both have their own lives and if this does not work she will be calling Dr Leigh back.

## 2024-04-06 NOTE — Telephone Encounter (Signed)
 I think they should try an enema x 2 at home if possible.  She should otherwise increase her MIralax  if she does not want to take any pills. Take a double dose BID but if that is not helping, Miralax  bowel prep purge is reasonable next step. I would start with the enemas first and see if that helps. She has a history of impaction and want to prevent that. Thanks

## 2024-04-18 ENCOUNTER — Other Ambulatory Visit (HOSPITAL_COMMUNITY): Payer: Self-pay

## 2024-04-19 ENCOUNTER — Encounter (HOSPITAL_BASED_OUTPATIENT_CLINIC_OR_DEPARTMENT_OTHER): Attending: General Surgery | Admitting: General Surgery

## 2024-04-19 DIAGNOSIS — I509 Heart failure, unspecified: Secondary | ICD-10-CM | POA: Diagnosis not present

## 2024-04-19 DIAGNOSIS — L98412 Non-pressure chronic ulcer of buttock with fat layer exposed: Secondary | ICD-10-CM | POA: Diagnosis not present

## 2024-04-19 DIAGNOSIS — L98419 Non-pressure chronic ulcer of buttock with unspecified severity: Secondary | ICD-10-CM | POA: Diagnosis not present

## 2024-04-19 DIAGNOSIS — Z86718 Personal history of other venous thrombosis and embolism: Secondary | ICD-10-CM | POA: Diagnosis not present

## 2024-04-19 DIAGNOSIS — E11622 Type 2 diabetes mellitus with other skin ulcer: Secondary | ICD-10-CM | POA: Diagnosis not present

## 2024-04-19 DIAGNOSIS — L0231 Cutaneous abscess of buttock: Secondary | ICD-10-CM | POA: Diagnosis not present

## 2024-04-19 DIAGNOSIS — E1140 Type 2 diabetes mellitus with diabetic neuropathy, unspecified: Secondary | ICD-10-CM | POA: Diagnosis not present

## 2024-04-19 DIAGNOSIS — I110 Hypertensive heart disease with heart failure: Secondary | ICD-10-CM | POA: Diagnosis not present

## 2024-04-19 DIAGNOSIS — J449 Chronic obstructive pulmonary disease, unspecified: Secondary | ICD-10-CM | POA: Diagnosis not present

## 2024-04-29 ENCOUNTER — Encounter (HOSPITAL_BASED_OUTPATIENT_CLINIC_OR_DEPARTMENT_OTHER): Attending: General Surgery | Admitting: General Surgery

## 2024-04-29 DIAGNOSIS — E11622 Type 2 diabetes mellitus with other skin ulcer: Secondary | ICD-10-CM | POA: Insufficient documentation

## 2024-04-29 DIAGNOSIS — L98412 Non-pressure chronic ulcer of buttock with fat layer exposed: Secondary | ICD-10-CM | POA: Diagnosis not present

## 2024-04-29 DIAGNOSIS — L98419 Non-pressure chronic ulcer of buttock with unspecified severity: Secondary | ICD-10-CM | POA: Diagnosis not present

## 2024-04-29 DIAGNOSIS — L0231 Cutaneous abscess of buttock: Secondary | ICD-10-CM | POA: Diagnosis not present

## 2024-05-02 NOTE — Telephone Encounter (Signed)
 PT daughter is calling to get scheduled immediately to see Dr. Leigh per his request as stated. Requesting to speak with nurse to discuss the food trapped in her throat.

## 2024-05-03 NOTE — Telephone Encounter (Signed)
 Pt scheduled to see Bayley Mcmichael PA 05/06/24 at 9:20am. Sent mychart message. Will also call daughter.

## 2024-05-06 ENCOUNTER — Other Ambulatory Visit: Payer: Self-pay | Admitting: Internal Medicine

## 2024-05-06 ENCOUNTER — Encounter: Payer: Self-pay | Admitting: Gastroenterology

## 2024-05-06 ENCOUNTER — Ambulatory Visit: Admitting: Gastroenterology

## 2024-05-06 VITALS — BP 130/66 | HR 60 | Ht 64.0 in | Wt 135.0 lb

## 2024-05-06 DIAGNOSIS — R933 Abnormal findings on diagnostic imaging of other parts of digestive tract: Secondary | ICD-10-CM

## 2024-05-06 DIAGNOSIS — Q396 Congenital diverticulum of esophagus: Secondary | ICD-10-CM

## 2024-05-06 DIAGNOSIS — R1310 Dysphagia, unspecified: Secondary | ICD-10-CM

## 2024-05-06 DIAGNOSIS — R159 Full incontinence of feces: Secondary | ICD-10-CM

## 2024-05-06 DIAGNOSIS — K5909 Other constipation: Secondary | ICD-10-CM

## 2024-05-06 DIAGNOSIS — K224 Dyskinesia of esophagus: Secondary | ICD-10-CM

## 2024-05-06 NOTE — Progress Notes (Signed)
 Chief Complaint: Chronic dysphagia and chronic constipation Primary GI MD: Dr. Leigh  HPI: Discussed the use of AI scribe software for clinical note transcription with the patient, who gave verbal consent to proceed.  History of Present Illness  Stacey Armstrong is a 89 year old female with esophageal diverticulum who presents with swallowing difficulties and constipation.   She has a history of esophageal diverticulum, causing significant swallowing difficulties. Food often regurgitates. Frequent burping and hiccuping after eating are reported. She is currently taking omeprazole  twice a day with minimal relief.  She has a history of chronic constipation and fecal impactions with worsening constipation early November.  She is on MiraLAX  once daily and Linzess  290 mcg, but bowel movements have been infrequent.. She has not used enemas at home, although they were effective during a previous hospital stay.  She declines the use of enemas.  She reports a lot of gas and occasional 'runs,' but no significant bowel movements. She is hesitant to use enemas at home due to past experiences.  She had previously done a GoLytely  bowel purge with adequate results but she declines this today as daughter would have to help her with this is unable to do so.  Her nutritional intake is limited due to swallowing difficulties. She struggles to meet the advised intake of 60-70 grams of protein and at least 1200 calories daily. She drinks Premier Protein for protein intake but finds it challenging to consume enough calories and water.    PREVIOUS GI WORKUP    EGD 06/08/2023 - Food in the middle third of the esophagus at level of a diverticulum in the middle third of the esophagus. Luminal food was cleared, the diverticulum is impacted with old food debris.  - Tortuous esophagus.  - Z- line regular.  - Hiatal hernia.  - Gastric lipoma.  Overall, patient' s dysphagia is due to diverticulum and dysmotility -  no obvious stricture. She did not tolerate anesthesia well with transient hypoxia. No empiric dilation performed.  Difficult situation to treat this. I think she would do best on a liquid diet indefinitely and consider feeding tube ( PEG) if nutrition becomes a problem. If they wish to pursue aggressive therapy or intervention on the diverticulum would recommend tertiary care center evaluation.  Barium swallow 02/2023 IMPRESSION: 1. Pulsion esophageal diverticulum at the level the carina. barium tablet became lodged at the level of the diverticulum. 2. Esophageal dysmotility consistent with moderate presbyesophagus. 3. Luminal diameter of the distal esophagus could not be assessed as barium tablet lodged in the midesophagus.    Past Medical History:  Diagnosis Date   Arthritis    Bronchitis    Complication of anesthesia    after gallbladder surgery with extubation had shallow respirations,  needed to continue intudation for additonal 10-12 days   Diabetes (HCC)    DVT (deep venous thrombosis) (HCC)    left leg after hysterectomy   GERD (gastroesophageal reflux disease)    History of COVID-19 05/2021   History of kidney stones    History of transient ischemic attack (TIA)    HOH (hard of hearing)    Hyperlipidemia    Hypertension    Kidney stones 2023   Sleep apnea     Past Surgical History:  Procedure Laterality Date   ABDOMINAL HYSTERECTOMY     APPENDECTOMY     CATARACT EXTRACTION, BILATERAL     CHOLECYSTECTOMY     COLONOSCOPY     CYSTOSCOPY WITH STENT PLACEMENT Right 08/14/2021  Procedure: CYSTOSCOPY WITH STENT PLACEMENT;  Surgeon: Cam Morene ORN, MD;  Location: [facility];  Service: Urology;  Laterality: Right;  ONLY NEEDS 30 MIN   EXTRACORPOREAL SHOCK WAVE LITHOTRIPSY Right 02/06/2022   Procedure: RIGHT EXTRACORPOREAL SHOCK WAVE LITHOTRIPSY (ESWL);  Surgeon: Cam Morene ORN, MD;  Location: [facility];  Service: Urology;  Laterality: Right;     Current Outpatient Medications  Medication Sig Dispense Refill   Accu-Chek FastClix Lancets MISC Use one as directed daily to test blood glucose. 102 each 3   amLODipine  (NORVASC ) 10 MG tablet Take 1 tablet (10 mg total) by mouth daily. 90 tablet 1   aspirin  EC 81 MG tablet Take 81 mg by mouth in the morning.     bisoprolol  (ZEBETA ) 5 MG tablet Take 1 tablet (5 mg total) by mouth daily. 90 tablet 1   Blood Glucose Monitoring Suppl (ONE TOUCH ULTRA 2) w/Device KIT Use to test blood glucose once daily. 1 kit 0   Cholecalciferol  (VITAMIN D3) 50 MCG (2000 UT) TABS Take 2,000 Units by mouth in the morning.     furosemide  (LASIX ) 20 MG tablet Take 1 tablet (20 mg total) by mouth daily. 90 tablet 1   glucose blood (ONETOUCH ULTRA BLUE TEST) test strip Use to test once daily. 100 each 3   ipratropium (ATROVENT ) 0.03 % nasal spray Place 2 sprays into both nostrils 3 (three) times daily.     linaclotide  (LINZESS ) 290 MCG CAPS capsule Take 1 capsule (290 mcg total) by mouth daily. 90 capsule 1   omeprazole  (PRILOSEC) 40 MG capsule Take 1 capsule (40 mg total) by mouth 2 (two) times daily. 180 capsule 1   Polyethyl Glycol-Propyl Glycol (SYSTANE ULTRA) 0.4-0.3 % SOLN Place 1-2 drops into both eyes 3 (three) times daily as needed (dry/irritated eyes.).     polyethylene glycol powder (GLYCOLAX /MIRALAX ) 17 GM/SCOOP powder Take 17 g by mouth 2 (two) times daily. 3350 g 1   potassium chloride  SA (KLOR-CON  M) 20 MEQ tablet Take 1 tablet (20 mEq total) by mouth daily. 90 tablet 3   Probiotic Product (PROBIOTIC PO) Take 1 capsule by mouth daily.     psyllium (HYDROCIL/METAMUCIL) 95 % PACK Take 1 packet by mouth daily. 240 each 3   Vibegron  (GEMTESA ) 75 MG TABS Take 1 tablet (75 mg total) by mouth daily. 90 tablet 3   glucose blood (TRUE METRIX BLOOD GLUCOSE TEST) test strip Use to check blood sugar once daily. 100 each 3   No current facility-administered medications for this visit.    Allergies as of  05/06/2024 - Review Complete 05/06/2024  Allergen Reaction Noted   Nitrofurantoin Other (See Comments) 01/09/2023   Penicillins Hives 12/07/2018   Coconut oil Hives, Swelling, and Rash 03/08/2022   Other Hives, Swelling, and Rash 07/08/2019   Sulfamethoxazole -trimethoprim  Nausea And Vomiting and Nausea Only 08/22/2021    Family History  Problem Relation Age of Onset   CAD Maternal Grandmother    Heart disease Maternal Grandmother    Pneumonia Maternal Grandfather    Liver disease Neg Hx    Colon cancer Neg Hx    Esophageal cancer Neg Hx    Rectal cancer Neg Hx    Stomach cancer Neg Hx     Social History   Socioeconomic History   Marital status: Widowed    Spouse name: Not on file   Number of children: 4   Years of education: Not on file   Highest education level: Not on file  Occupational History   Occupation: retred  Tobacco Use   Smoking status: Former   Smokeless tobacco: Never  Advertising Account Planner   Vaping status: Never Used  Substance and Sexual Activity   Alcohol  use: Not Currently   Drug use: Never   Sexual activity: Not Currently    Birth control/protection: Surgical  Other Topics Concern   Not on file  Social History Narrative   Not on file   Social Drivers of Health   Tobacco Use: Low Risk  (03/15/2024)   Received from [facility]   Patient History    Smoking Tobacco Use: Never    Smokeless Tobacco Use: Never    Passive Exposure: Not on file  Recent Concern: Tobacco Use - Medium Risk (03/02/2024)   Patient History    Smoking Tobacco Use: Former    Smokeless Tobacco Use: Never    Passive Exposure: Not on Actuary Strain: Low Risk (01/29/2024)   Overall Financial Resource Strain (CARDIA)    Difficulty of Paying Living Expenses: Not hard at all  Food Insecurity: No Food Insecurity (01/29/2024)   Epic    Worried About Radiation Protection Practitioner of Food in the Last Year: Never true    Ran Out of Food in the Last Year: Never true   Transportation Needs: No Transportation Needs (01/29/2024)   Epic    Lack of Transportation (Medical): No    Lack of Transportation (Non-Medical): No  Physical Activity: Inactive (01/29/2024)   Exercise Vital Sign    Days of Exercise per Week: 0 days    Minutes of Exercise per Session: 0 min  Stress: No Stress Concern Present (01/29/2024)   [employer] of Occupational Health - Occupational Stress Questionnaire    Feeling of Stress: Not at all  Social Connections: Moderately Integrated (01/29/2024)   Social Connection and Isolation Panel    Frequency of Communication with Friends and Family: More than three times a week    Frequency of Social Gatherings with Friends and Family: More than three times a week    Attends Religious Services: More than 4 times per year    Active Member of [employer] or Organizations: Yes    Attends Banker Meetings: More than 4 times per year    Marital Status: Widowed  Intimate Partner Violence: Not At Risk (01/29/2024)   Epic    Fear of Current or Ex-Partner: No    Emotionally Abused: No    Physically Abused: No    Sexually Abused: No  Depression (PHQ2-9): Low Risk (01/29/2024)   Depression (PHQ2-9)    PHQ-2 Score: 0  Alcohol  Screen: Low Risk (01/29/2024)   Alcohol  Screen    Last Alcohol  Screening Score (AUDIT): 0  Housing: Unknown (03/10/2024)   Received from [facility]   Epic    Unable to Pay for Housing in the Last Year: Not on file    Number of Times Moved in the Last Year: Not on file    At any time in the past 12 months, were you homeless or living in a shelter (including now)?: No  Utilities: Not At Risk (01/29/2024)   Epic    Threatened with loss of utilities: No  Health Literacy: Adequate Health Literacy (01/29/2024)   B1300 Health Literacy    Frequency of need for help with medical instructions: Never    Review of Systems:    Constitutional: No weight loss, fever, chills, weakness or fatigue HEENT: Eyes: No change  in vision  Ears, Nose, Throat:  No change in hearing or congestion Skin: No rash or itching Cardiovascular: No chest pain, chest pressure or palpitations   Respiratory: No SOB or cough Gastrointestinal: See HPI and otherwise negative Genitourinary: No dysuria or change in urinary frequency Neurological: No headache, dizziness or syncope Musculoskeletal: No new muscle or joint pain Hematologic: No bleeding or bruising Psychiatric: No history of depression or anxiety    Physical Exam:  Vital signs: BP 130/66   Pulse 60   Ht 5' 4 (1.626 m)   Wt 135 lb (61.2 kg)   BMI 23.17 kg/m   Constitutional: NAD, alert and cooperative Head:  Normocephalic and atraumatic. Eyes:   PEERL, EOMI. No icterus. Conjunctiva pink. Respiratory: Respirations even and unlabored. Lungs clear to auscultation bilaterally.   No wheezes, crackles, or rhonchi.  Cardiovascular:  Regular rate and rhythm. No peripheral edema, cyanosis or pallor.  Gastrointestinal:  Soft, nondistended, nontender. No rebound or guarding. Normal bowel sounds. No appreciable masses or hepatomegaly. Rectal:  Declines Msk:  Symmetrical without gross deformities. Without edema, no deformity or joint abnormality.  Neurologic:  Alert and  oriented x4;  grossly normal neurologically.  Skin:   Dry and intact without significant lesions or rashes. Psychiatric: Oriented to person, place and time. Demonstrates good judgement and reason without abnormal affect or behaviors.   RELEVANT LABS AND IMAGING: CBC    Component Value Date/Time   WBC 6.6 01/09/2023 0645   RBC 3.91 01/09/2023 0645   HGB 10.1 (L) 01/09/2023 0645   HCT 32.8 (L) 01/09/2023 0645   PLT 218 01/09/2023 0645   MCV 83.9 01/09/2023 0645   MCH 25.8 (L) 01/09/2023 0645   MCHC 30.8 01/09/2023 0645   RDW 16.8 (H) 01/09/2023 0645   LYMPHSABS 0.2 (L) 01/08/2023 0109   MONOABS 0.7 01/08/2023 0109   EOSABS 0.0 01/08/2023 0109   BASOSABS 0.0 01/08/2023 0109     CMP     Component Value Date/Time   NA 138 01/27/2023 1042   K 4.5 01/27/2023 1042   CL 103 01/27/2023 1042   CO2 27 01/27/2023 1042   GLUCOSE 109 (H) 01/27/2023 1042   BUN 23 01/27/2023 1042   CREATININE 1.48 (H) 01/27/2023 1042   CREATININE 0.96 (H) 02/22/2020 1122   CALCIUM  9.1 01/27/2023 1042   PROT 6.5 01/27/2023 1042   ALBUMIN 3.5 01/27/2023 1042   AST 18 01/27/2023 1042   ALT 16 01/27/2023 1042   ALKPHOS 118 (H) 01/27/2023 1042   BILITOT 0.4 01/27/2023 1042   GFRNONAA 25 (L) 01/10/2023 0102   GFRAA >60 01/24/2019 2034     Assessment/Plan:   Chronic constipation and fecal impactions Unable to take pills, they have to be crushed up.  Currently on MiraLAX  1 capful per day and Linzess  290 mcg with continued constipation.  Previous improvement with GoLytely  bowel purge in the past but this was during hospitalization, when offered to pursue repeat GoLytely  bowel purge this was declined as patient is in independent living and daughter reports she would be unable to help her with this.  Patient also declined use of enemas.  Patient has trouble taking in water so she is not getting adequate hydration likely perpetuating her worsening constipation.  Also pelvic floor component with occasional incontinence.  Difficult situation.  Ultimately agreed on the following - Increase MiraLAX  to 2 capfuls per day - Squatty potty to help with bowel movements - Try to increase water as much as patient is able to - Continue Linzess  290 mcg -  Patient declined enemas/bowel purge - If no improvement with the above, we can consider adding in other medications such as lactulose  Dysphagia On omeprazole  40 mg twice daily.  EGD 05/2023 with food at the level of the diverticulum in the middle third of the esophagus, tortuous esophagus, hiatal hernia, gastric lipoma.  Previous barium swallow with esophageal diverticulum measuring 3 cm.  EGD complicated by transient hypoxia.  Dr. Leigh recommended  PEG tube and tertiary care center which appears was not pursued.  Patient adamantly declines consideration of PEG tube but would like further investigation of her dysphagia.  - Continue PPI twice daily - Could consider Voquezna, likely won't be helpful as her dysphagia is mainly due to dysmotility/diverticulum - Educated patient on lifestyle modifications - Refer to [facility] for dysphagia   This visit required 46 minutes of patient care (this includes precharting, chart review, review of results, face-to-face time used for counseling as well as treatment plan and follow-up. The patient was provided an opportunity to ask questions and all were answered. The patient agreed with the plan and demonstrated an understanding of the instructions.   Nestor Mollie RIGGERS [facility] 05/06/2024, 10:49 AM  Cc: Theophilus Andrews, Tully GRADE, MD

## 2024-05-06 NOTE — Patient Instructions (Signed)
 VISIT SUMMARY:  During your visit, we discussed your ongoing issues with swallowing difficulties, constipation, and nutritional intake. We reviewed your current medications and made some adjustments to help manage your symptoms more effectively. You have been referred to a specialist for further evaluation of your esophageal diverticulum.  YOUR PLAN:  -ESOPHAGEAL DIVERTICULUM WITH DYSPHAGIA: Esophageal diverticulum is a condition where a pouch forms in the esophagus, causing difficulty swallowing and regurgitation. You will continue taking omeprazole  twice daily and have been referred to [facility] for specialized management. Please sit upright after meals and avoid reclining, caffeine , and acidic foods.  -CHRONIC CONSTIPATION WITH PELVIC FLOOR DYSFUNCTION: Chronic constipation with pelvic floor dysfunction means that your pelvic muscles are not working properly, leading to infrequent bowel movements. You should increase Miralax  to twice daily for at least two weeks, consider using a squatty potty, replace Metamucil with Benefiber, and mix Miralax  and Benefiber with a non-carbonated drink or soup. Also, try to drink more fluids.  -GASTROESOPHAGEAL REFLUX DISEASE: Gastroesophageal reflux disease (GERD) is a condition where stomach acid frequently flows back into the esophagus, causing regurgitation and burping. Continue taking omeprazole  twice daily, avoid reclining after meals, and limit caffeine  intake.  -PROTEIN-CALORIE MALNUTRITION: Protein-calorie malnutrition occurs when you are not getting enough protein and calories. You are encouraged to take high-calorie supplements like Boost, mix Miralax  and Benefiber with protein supplements, and increase your fluid intake to help with constipation.  INSTRUCTIONS:  You have been referred to [facility] for specialized management of your esophageal diverticulum. Please follow up with them as instructed.

## 2024-05-06 NOTE — Progress Notes (Signed)
 Agree with assessment of the following thoughts.  This is a difficult situation given her age and poor tolerance of anesthesia with prior endoscopy.  I think she is a poor candidate for any invasive treatment for the large esophageal diverticulum.  She would probably do best on a purely liquid diet, and as I previously noted if she is becoming malnourished a PEG tube would be a reasonable consideration.  If she really wants a tertiary care opinion on this we can do that for her if she wants to be aggressive in treatment for this.

## 2024-05-06 NOTE — Telephone Encounter (Unsigned)
 Copied from CRM #[id number]. Topic: Clinical - Medication Refill >> May 06, 2024  1:50 PM Drema MATSU wrote: Medication: ipratropium (ATROVENT ) 0.03 % nasal spray  Has the patient contacted their pharmacy? Yes (Agent: If no, request that the patient contact the pharmacy for the refill. If patient does not wish to contact the pharmacy document the reason why and proceed with request.)  (Agent: If yes, when and what did the pharmacy advise?)  This is the patient's preferred pharmacy:  [facility] [address] [place] [address] Phone: [phone] Fax: [phone]  Is this the correct pharmacy for this prescription? Yes If no, delete pharmacy and type the correct one.   Has the prescription been filled recently? No  Is the patient out of the medication? Yes  Has the patient been seen for an appointment in the last year OR does the patient have an upcoming appointment? Yes  Can we respond through MyChart? No   Agent: Please be advised that Rx refills may take up to 3 business days. We ask that you follow-up with your pharmacy.

## 2024-05-09 ENCOUNTER — Other Ambulatory Visit (HOSPITAL_COMMUNITY): Payer: Self-pay

## 2024-05-09 MED ORDER — IPRATROPIUM BROMIDE 0.03 % NA SOLN
2.0000 | Freq: Three times a day (TID) | NASAL | 0 refills | Status: AC
  Filled 2024-05-09: qty 30, 30d supply, fill #0

## 2024-05-12 ENCOUNTER — Other Ambulatory Visit: Payer: Self-pay

## 2024-05-12 ENCOUNTER — Encounter (HOSPITAL_BASED_OUTPATIENT_CLINIC_OR_DEPARTMENT_OTHER): Admitting: General Surgery

## 2024-05-12 DIAGNOSIS — E11622 Type 2 diabetes mellitus with other skin ulcer: Secondary | ICD-10-CM | POA: Diagnosis not present

## 2024-05-16 ENCOUNTER — Other Ambulatory Visit (HOSPITAL_COMMUNITY): Payer: Self-pay

## 2024-05-27 ENCOUNTER — Other Ambulatory Visit (HOSPITAL_COMMUNITY): Payer: Self-pay

## 2024-05-27 ENCOUNTER — Ambulatory Visit: Payer: Self-pay

## 2024-05-27 MED ORDER — GUAIFENESIN-DM 100-10 MG/5ML PO SYRP
ORAL_SOLUTION | ORAL | 0 refills | Status: AC
  Filled 2024-05-27: qty 100, 10d supply, fill #0
  Filled 2024-06-03: qty 118, 5d supply, fill #0

## 2024-05-27 MED ORDER — DOXYCYCLINE HYCLATE 100 MG PO TABS
ORAL_TABLET | ORAL | 0 refills | Status: DC
  Filled 2024-05-27: qty 20, 10d supply, fill #0

## 2024-05-27 NOTE — Telephone Encounter (Signed)
 FYI Only or Action Required?: FYI only for provider: no sdv proceeding to urgent care.  Patient was last seen in primary care on 03/02/2024 by Theophilus Andrews, Tully GRADE, MD.  Called Nurse Triage reporting Cough.  Symptoms began 3 days ago.  Interventions attempted: OTC medications: Coricidin hbp and Rest, hydration, or home remedies.  Symptoms are: gradually worsening.  Triage Disposition: See HCP Within 4 Hours (Or PCP Triage)  Patient/caregiver understands and will follow disposition?: Yes   Reason for Disposition  [1] MILD difficulty breathing (e.g., minimal/no SOB at rest, SOB with walking, pulse < 100) AND [2] still present when not coughing  Answer Assessment - Initial Assessment Questions Additional info: Cough started 3 days ago, constant, deep and strong, productive of clear sputum, she does not feel she clears her airway fully. Feels short of breath. She does have a history of esophageal motility issues and on a liquid diet. She does have poor appetite at this time.    No SDV available, proceeding to urgent care.   1. ONSET: When did the cough begin?      3 days  2. SEVERITY: How bad is the cough today?      Constant 3. SPUTUM: Describe the color of your sputum (e.g., none, dry cough; clear, white, yellow, green)     clear 4. HEMOPTYSIS: Are you coughing up any blood? If Yes, ask: How much? (e.g., flecks, streaks, tablespoons, etc.)     denies 5. DIFFICULTY BREATHING: Are you having difficulty breathing? If Yes, ask: How bad is it? (e.g., mild, moderate, severe)      Shortness  of breath  6. FEVER: Do you have a fever? If Yes, ask: What is your temperature, how was it measured, and when did it start?     denies 7. CARDIAC HISTORY: Do you have any history of heart disease? (e.g., heart attack, congestive heart failure)      Chf, htn 8. LUNG HISTORY: Do you have any history of lung disease?  (e.g., pulmonary embolus, asthma, emphysema)      copd 9. PE RISK FACTORS: Do you have a history of blood clots? (or: recent major surgery, recent prolonged travel, bedridden)     unknown 10. OTHER SYMPTOMS: Do you have any other symptoms? (e.g., runny nose, wheezing, chest pain)       Runny nose, nasal drainage. On liquid diet due to esophagus issues.  Protocols used: Cough - Acute Productive-A-AH  Copied from CRM #[id number]. Topic: Clinical - Red Word Triage >> May 27, 2024 12:56 PM Avram MATSU wrote: Red Word that prompted transfer to Nurse Triage: constant coughing with sob   ----------------------------------------------------------------------- From previous Reason for Contact - Scheduling: Patient/patient representative is calling to schedule an appointment. Refer to attachments for appointment information.

## 2024-05-28 ENCOUNTER — Other Ambulatory Visit (HOSPITAL_COMMUNITY): Payer: Self-pay

## 2024-05-30 ENCOUNTER — Other Ambulatory Visit: Payer: Self-pay

## 2024-05-30 ENCOUNTER — Emergency Department (HOSPITAL_COMMUNITY)

## 2024-05-30 ENCOUNTER — Other Ambulatory Visit (HOSPITAL_COMMUNITY): Payer: Self-pay

## 2024-05-30 ENCOUNTER — Ambulatory Visit: Payer: Self-pay

## 2024-05-30 ENCOUNTER — Emergency Department (HOSPITAL_COMMUNITY)
Admission: EM | Admit: 2024-05-30 | Discharge: 2024-05-30 | Disposition: A | Discharge status: Home / Self Care | Attending: Emergency Medicine | Admitting: Emergency Medicine

## 2024-05-30 ENCOUNTER — Telehealth: Payer: Self-pay | Admitting: *Deleted

## 2024-05-30 DIAGNOSIS — J449 Chronic obstructive pulmonary disease, unspecified: Secondary | ICD-10-CM | POA: Insufficient documentation

## 2024-05-30 DIAGNOSIS — J40 Bronchitis, not specified as acute or chronic: Secondary | ICD-10-CM | POA: Insufficient documentation

## 2024-05-30 DIAGNOSIS — Z7982 Long term (current) use of aspirin: Secondary | ICD-10-CM | POA: Insufficient documentation

## 2024-05-30 DIAGNOSIS — I129 Hypertensive chronic kidney disease with stage 1 through stage 4 chronic kidney disease, or unspecified chronic kidney disease: Secondary | ICD-10-CM | POA: Insufficient documentation

## 2024-05-30 DIAGNOSIS — E1122 Type 2 diabetes mellitus with diabetic chronic kidney disease: Secondary | ICD-10-CM | POA: Diagnosis not present

## 2024-05-30 DIAGNOSIS — R059 Cough, unspecified: Secondary | ICD-10-CM | POA: Diagnosis present

## 2024-05-30 DIAGNOSIS — N189 Chronic kidney disease, unspecified: Secondary | ICD-10-CM | POA: Diagnosis not present

## 2024-05-30 DIAGNOSIS — R051 Acute cough: Secondary | ICD-10-CM

## 2024-05-30 DIAGNOSIS — J439 Emphysema, unspecified: Secondary | ICD-10-CM | POA: Diagnosis not present

## 2024-05-30 DIAGNOSIS — I700 Atherosclerosis of aorta: Secondary | ICD-10-CM | POA: Insufficient documentation

## 2024-05-30 DIAGNOSIS — E440 Moderate protein-calorie malnutrition: Secondary | ICD-10-CM

## 2024-05-30 DIAGNOSIS — R911 Solitary pulmonary nodule: Secondary | ICD-10-CM | POA: Insufficient documentation

## 2024-05-30 DIAGNOSIS — R0602 Shortness of breath: Secondary | ICD-10-CM | POA: Diagnosis present

## 2024-05-30 DIAGNOSIS — E1169 Type 2 diabetes mellitus with other specified complication: Secondary | ICD-10-CM

## 2024-05-30 LAB — CBC WITH DIFFERENTIAL/PLATELET
Abs Immature Granulocytes: 0.04 K/uL (ref 0.00–0.07)
Basophils Absolute: 0 K/uL (ref 0.0–0.1)
Basophils Relative: 0 %
Eosinophils Absolute: 0.1 K/uL (ref 0.0–0.5)
Eosinophils Relative: 1 %
HCT: 28.7 % — ABNORMAL LOW (ref 36.0–46.0)
Hemoglobin: 8.5 g/dL — ABNORMAL LOW (ref 12.0–15.0)
Immature Granulocytes: 1 %
Lymphocytes Relative: 17 %
Lymphs Abs: 1.4 K/uL (ref 0.7–4.0)
MCH: 21.4 pg — ABNORMAL LOW (ref 26.0–34.0)
MCHC: 29.6 g/dL — ABNORMAL LOW (ref 30.0–36.0)
MCV: 72.1 fL — ABNORMAL LOW (ref 80.0–100.0)
Monocytes Absolute: 1 K/uL (ref 0.1–1.0)
Monocytes Relative: 11 %
Neutro Abs: 6.1 K/uL (ref 1.7–7.7)
Neutrophils Relative %: 70 %
Platelets: 409 K/uL — ABNORMAL HIGH (ref 150–400)
RBC: 3.98 MIL/uL (ref 3.87–5.11)
RDW: 17.2 % — ABNORMAL HIGH (ref 11.5–15.5)
WBC: 8.6 K/uL (ref 4.0–10.5)
nRBC: 0 % (ref 0.0–0.2)

## 2024-05-30 LAB — COMPREHENSIVE METABOLIC PANEL WITH GFR
ALT: 11 U/L (ref 0–44)
AST: 29 U/L (ref 15–41)
Albumin: 3.4 g/dL — ABNORMAL LOW (ref 3.5–5.0)
Alkaline Phosphatase: 120 U/L (ref 38–126)
Anion gap: 9 (ref 5–15)
BUN: 54 mg/dL — ABNORMAL HIGH (ref 8–23)
CO2: 24 mmol/L (ref 22–32)
Calcium: 9.2 mg/dL (ref 8.9–10.3)
Chloride: 100 mmol/L (ref 98–111)
Creatinine, Ser: 1.42 mg/dL — ABNORMAL HIGH (ref 0.44–1.00)
GFR, Estimated: 34 mL/min — ABNORMAL LOW
Glucose, Bld: 116 mg/dL — ABNORMAL HIGH (ref 70–99)
Potassium: 4.5 mmol/L (ref 3.5–5.1)
Sodium: 134 mmol/L — ABNORMAL LOW (ref 135–145)
Total Bilirubin: 0.3 mg/dL (ref 0.0–1.2)
Total Protein: 6.1 g/dL — ABNORMAL LOW (ref 6.5–8.1)

## 2024-05-30 LAB — BLOOD GAS, VENOUS
Acid-Base Excess: 0.4 mmol/L (ref 0.0–2.0)
Bicarbonate: 26 mmol/L (ref 20.0–28.0)
O2 Saturation: 31.8 %
Patient temperature: 37
pCO2, Ven: 46 mmHg (ref 44–60)
pH, Ven: 7.36 (ref 7.25–7.43)
pO2, Ven: <31 mmHg — CL (ref 32–45)

## 2024-05-30 LAB — PRO BRAIN NATRIURETIC PEPTIDE: Pro Brain Natriuretic Peptide: 1487 pg/mL — ABNORMAL HIGH

## 2024-05-30 MED ORDER — PREDNISONE 10 MG PO TABS
ORAL_TABLET | Freq: Every day | ORAL | 0 refills | Status: DC
  Filled 2024-05-30: qty 42, 12d supply, fill #0

## 2024-05-30 MED ORDER — ALBUTEROL SULFATE (2.5 MG/3ML) 0.083% IN NEBU
2.5000 mg | INHALATION_SOLUTION | Freq: Once | RESPIRATORY_TRACT | Status: AC
  Administered 2024-05-30: 2.5 mg via RESPIRATORY_TRACT
  Filled 2024-05-30: qty 3

## 2024-05-30 MED ORDER — IPRATROPIUM-ALBUTEROL 0.5-2.5 (3) MG/3ML IN SOLN
3.0000 mL | Freq: Once | RESPIRATORY_TRACT | Status: AC
  Administered 2024-05-30: 3 mL via RESPIRATORY_TRACT
  Filled 2024-05-30: qty 3

## 2024-05-30 MED ORDER — METHYLPREDNISOLONE SODIUM SUCC 125 MG IJ SOLR
80.0000 mg | Freq: Once | INTRAMUSCULAR | Status: AC
  Administered 2024-05-30: 80 mg via INTRAVENOUS
  Filled 2024-05-30: qty 2

## 2024-05-30 MED ORDER — IPRATROPIUM-ALBUTEROL 0.5-2.5 (3) MG/3ML IN SOLN
3.0000 mL | RESPIRATORY_TRACT | 3 refills | Status: DC | PRN
  Filled 2024-05-30: qty 90, 5d supply, fill #0
  Filled 2024-06-06: qty 90, 5d supply, fill #1

## 2024-05-30 MED ORDER — METHYLPREDNISOLONE SODIUM SUCC 125 MG IJ SOLR: 60.0000 mg | Freq: Once | INTRAMUSCULAR | Status: DC

## 2024-05-30 MED ORDER — LACTATED RINGERS IV BOLUS
500.0000 mL | Freq: Once | INTRAVENOUS | Status: AC
  Administered 2024-05-30: 500 mL via INTRAVENOUS

## 2024-05-30 NOTE — Telephone Encounter (Signed)
 Duplicate

## 2024-05-30 NOTE — Telephone Encounter (Signed)
 Copied from CRM [id number]. Topic: Clinical - Medical Advice >> May 30, 2024 12:15 PM Emylou G wrote: Reason for CRM: Daughter called .. having hard time getting enough calories due to espophagus issue.. she is on a liquid diet.. wants to know how to up that.SABRA

## 2024-05-30 NOTE — ED Triage Notes (Signed)
 Patient reports worsening productive cough, seen at urgent care friday and had a clear chest xray and negative for covid/flu,currently on doxycycline  and prescribed cough medicine.

## 2024-05-30 NOTE — Telephone Encounter (Signed)
 Patient currently at the ED.

## 2024-05-30 NOTE — Discharge Instructions (Addendum)
 Take prednisone  in tapering dose as prescribed.  Use breathing treatments as needed.  Finish your course of doxycycline .  Your symptoms should improve over the next several days.  If you have any new or worsening symptoms of concern, return to the emergency department.  If you would like to follow-up with a pulmonologist, telephone number is below.

## 2024-05-30 NOTE — Telephone Encounter (Signed)
 FYI Only or Action Required?: FYI only for provider: ED advised.  Patient was last seen in primary care on 03/02/2024 by Stacey Armstrong, Tully GRADE, MD.  Called Nurse Triage reporting Dizziness and Cough.  Symptoms began several days ago.  Interventions attempted: Prescription medications: doxy, tussin DM.  Symptoms are: rapidly worsening.  Triage Disposition: Go to ED Now (Notify PCP)  Patient/caregiver understands and will follow disposition?: Yes Reason for Disposition  [1] MODERATE difficulty breathing (e.g., speaks in phrases, SOB even at rest, pulse 100-120) AND [2] NEW-onset or WORSE than normal  Answer Assessment - Initial Assessment Questions Patient seen in UC for cough and sinus infection. Started on doxy and tussin DM. Patient heard in the background while on the phone with daughter. She is coughing constantly. Cough causing SOB. Thick green mucous. SOB present when not coughing as well. Pulse ox 97% but struggling to pull breaths.  Dizziness- light headed- hasn't had great intake  Has esophagus problem where it doesn't have motility to move food so she has a liquid diet- not getting enough calories. Would like CMA/MD to call her regarding diet/ dietitian consult.   Advised ED for worsened SOB. Daughter reluctant since they spend 4hrs in [facility] Friday. Advised she needs support as she is compensating right now but sounds like she is getting worse as we speak. She understands.   1. RESPIRATORY STATUS: Describe your breathing? (e.g., wheezing, shortness of breath, unable to speak, severe coughing)      SOB at rest, cough, congestion  2. ONSET: When did this breathing problem begin?      Saturday 3. PATTERN Does the difficult breathing come and go, or has it been constant since it started?      Constant  4. SEVERITY: How bad is your breathing? (e.g., mild, moderate, severe)      Moderate SOB  5. RECURRENT SYMPTOM: Have you had difficulty breathing before? If Yes, ask:  When was the last time? and What happened that time?      unsure 6. CARDIAC HISTORY: Do you have any history of heart disease? (e.g., heart attack, angina, bypass surgery, angioplasty)      HF, HTN  7. LUNG HISTORY: Do you have any history of lung disease?  (e.g., pulmonary embolus, asthma, emphysema)     bronchitis 8. CAUSE: What do you think is causing the breathing problem?      Sinus infection  9. OTHER SYMPTOMS: Do you have any other symptoms? (e.g., chest pain, cough, dizziness, fever, runny nose)     Dizziness, intense cough, air hunger 10. O2 SATURATION MONITOR:  Do you use an oxygen saturation monitor (pulse oximeter) at home? If Yes, ask: What is your reading (oxygen level) today? What is your usual oxygen saturation reading? (e.g., 95%)       97%  Protocols used: Breathing Difficulty-A-AH

## 2024-05-30 NOTE — ED Provider Notes (Signed)
 " [place] EMERGENCY DEPARTMENT AT [facility] Provider Note   CSN: [id number] Arrival date & time: 05/30/24  1534     Patient presents with: Cough   Stacey Armstrong is a 89 y.o. female.    Cough Associated symptoms: shortness of breath   Patient presents for for cough.  Medical history includes HTN, HLD, DM, CKD, CVA, COPD, GERD.  She previously used inhalers but has not for years.  She has nebulizer machine which she does not use.  A week ago, patient developed cough and congestion.  She was seen in urgent care 3 days ago.  X-ray at the time showed no evidence of pneumonia.  Influenza swab was negative.  Viral illness was suspected.  She was prescribed doxycycline  and cough medicine, which she has been taking.  Despite this, she has had worsening cough.  She has not been able to expectorate any sputum.  She has had decreased p.o. intake.  At baseline, she is on a liquid diet, due to esophageal dysfunction.  She has had lightheadedness and fatigue.  Patient lives independently but her daughter has been checking on her frequently over the past several days.    Prior to Admission medications  Medication Sig Start Date End Date Taking? Authorizing Provider  ipratropium-albuterol  (DUONEB) 0.5-2.5 (3) MG/3ML SOLN Take 3 mLs by nebulization every 4 (four) hours as needed. 05/30/24 06/04/24 Yes Melvenia Motto, MD  predniSONE  (STERAPRED UNI-PAK 21 TAB) 10 MG (21) TBPK tablet Take by mouth daily. Take 6 tabs by mouth daily  for 2 days, then 5 tabs for 2 days, then 4 tabs for 2 days, then 3 tabs for 2 days, 2 tabs for 2 days, then 1 tab by mouth daily for 2 days 05/30/24  Yes Melvenia Motto, MD  Accu-Chek FastClix Lancets MISC Use one as directed daily to test blood glucose. 01/06/24   Theophilus Andrews, Tully GRADE, MD  amLODipine  (NORVASC ) 10 MG tablet Take 1 tablet (10 mg total) by mouth daily. 11/18/23   Theophilus Andrews, Tully GRADE, MD  aspirin  EC 81 MG tablet Take 81 mg by mouth in the morning.     [provider]  bisoprolol  (ZEBETA ) 5 MG tablet Take 1 tablet (5 mg total) by mouth daily. 11/18/23   Theophilus Andrews, Tully GRADE, MD  Blood Glucose Monitoring Suppl (ONE TOUCH ULTRA 2) w/Device KIT Use to test blood glucose once daily. 01/06/24   Theophilus Andrews, Tully GRADE, MD  Cholecalciferol  (VITAMIN D3) 50 MCG (2000 UT) TABS Take 2,000 Units by mouth in the morning.    [provider]  doxycycline  (VIBRA -TABS) 100 MG tablet Take 1 tablet (100 mg total) by mouth 2 (two) times a day for 10 days. Take with 8 oz water. Do not lie down for at least 30 minutes after. 05/27/24     furosemide  (LASIX ) 20 MG tablet Take 1 tablet (20 mg total) by mouth daily. 01/14/24   Theophilus Andrews, Tully GRADE, MD  glucose blood (ONETOUCH ULTRA BLUE TEST) test strip Use to test once daily. 01/06/24   Theophilus Andrews, Tully GRADE, MD  glucose blood (TRUE METRIX BLOOD GLUCOSE TEST) test strip Use to check blood sugar once daily. 02/10/24   Theophilus Andrews, Tully GRADE, MD  guaiFENesin -dextromethorphan  (ROBITUSSIN DM) 100-10 MG/5ML syrup Take 5 mL by mouth every 12 (twelve) hours for 10 days. 05/27/24     ipratropium (ATROVENT ) 0.03 % nasal spray Place 2 sprays into both nostrils 3 (three) times daily. 05/09/24   Theophilus Andrews, Tully  Y, MD  linaclotide  (LINZESS ) 290 MCG CAPS capsule Take 1 capsule (290 mcg total) by mouth daily. 01/14/24   Theophilus Andrews, Tully GRADE, MD  omeprazole  (PRILOSEC) 40 MG capsule Take 1 capsule (40 mg total) by mouth 2 (two) times daily. 02/01/24   Theophilus Andrews, Tully GRADE, MD  Polyethyl Glycol-Propyl Glycol (SYSTANE ULTRA) 0.4-0.3 % SOLN Place 1-2 drops into both eyes 3 (three) times daily as needed (dry/irritated eyes.).    [provider]  polyethylene glycol powder (GLYCOLAX /MIRALAX ) 17 GM/SCOOP powder Take 17 g by mouth 2 (two) times daily. 07/03/22   Rai, Nydia POUR, MD  potassium chloride  SA (KLOR-CON  M) 20 MEQ tablet Take 1 tablet (20 mEq total) by mouth daily. 11/16/23    Theophilus Andrews, Tully GRADE, MD  Probiotic Product (PROBIOTIC PO) Take 1 capsule by mouth daily.    [provider]  psyllium (HYDROCIL/METAMUCIL) 95 % PACK Take 1 packet by mouth daily. 07/04/22   Rai, Ripudeep POUR, MD  Vibegron  (GEMTESA ) 75 MG TABS Take 1 tablet (75 mg total) by mouth daily. 10/22/23       Allergies: Nitrofurantoin, Penicillins, Coconut oil, Other, and Sulfamethoxazole -trimethoprim     Review of Systems  Constitutional:  Positive for appetite change and fatigue.  Respiratory:  Positive for cough and shortness of breath.   All other systems reviewed and are negative.   Updated Vital Signs BP (!) 128/38 (BP Location: Right Arm)   Pulse 64   Temp 98 F (36.7 C) (Oral)   Resp 13   SpO2 97%   Physical Exam Vitals and nursing note reviewed.  Constitutional:      General: She is not in acute distress.    Appearance: Normal appearance. She is well-developed. She is not ill-appearing, toxic-appearing or diaphoretic.  HENT:     Head: Normocephalic and atraumatic.     Right Ear: External ear normal.     Left Ear: External ear normal.     Nose: Nose normal.     Mouth/Throat:     Mouth: Mucous membranes are moist.  Eyes:     Extraocular Movements: Extraocular movements intact.     Conjunctiva/sclera: Conjunctivae normal.  Cardiovascular:     Rate and Rhythm: Normal rate and regular rhythm.     Heart sounds: No murmur heard. Pulmonary:     Effort: Pulmonary effort is normal. No accessory muscle usage or respiratory distress.     Breath sounds: Decreased breath sounds and rhonchi present.  Abdominal:     General: There is no distension.     Palpations: Abdomen is soft.  Musculoskeletal:        General: No swelling. Normal range of motion.     Cervical back: Normal range of motion and neck supple.  Skin:    General: Skin is warm and dry.     Coloration: Skin is not jaundiced or pale.  Neurological:     General: No focal deficit present.     Mental Status:  She is alert and oriented to person, place, and time.  Psychiatric:        Mood and Affect: Mood normal.        Behavior: Behavior normal.     (all labs ordered are listed, but only abnormal results are displayed) Labs Reviewed  COMPREHENSIVE METABOLIC PANEL WITH GFR - Abnormal; Notable for the following components:      Result Value   Sodium 134 (*)    Glucose, Bld 116 (*)    BUN 54 (*)  Creatinine, Ser 1.42 (*)    Total Protein 6.1 (*)    Albumin 3.4 (*)    GFR, Estimated 34 (*)    All other components within normal limits  BLOOD GAS, VENOUS - Abnormal; Notable for the following components:   pO2, Ven <31 (*)    All other components within normal limits  PRO BRAIN NATRIURETIC PEPTIDE - Abnormal; Notable for the following components:   Pro Brain Natriuretic Peptide 1,487.0 (*)    All other components within normal limits  CBC WITH DIFFERENTIAL/PLATELET - Abnormal; Notable for the following components:   Hemoglobin 8.5 (*)    HCT 28.7 (*)    MCV 72.1 (*)    MCH 21.4 (*)    MCHC 29.6 (*)    RDW 17.2 (*)    Platelets 409 (*)    All other components within normal limits    EKG: None  Radiology: CT Chest Wo Contrast Result Date: 05/30/2024 CLINICAL DATA:  Respiratory illness cough EXAM: CT CHEST WITHOUT CONTRAST TECHNIQUE: Multidetector CT imaging of the chest was performed following the standard protocol without IV contrast. RADIATION DOSE REDUCTION: This exam was performed according to the departmental dose-optimization program which includes automated exposure control, adjustment of the mA and/or kV according to patient size and/or use of iterative reconstruction technique. COMPARISON:  Chest x-ray 05/30/2024, CT 01/08/2023 FINDINGS: Cardiovascular: Limited without intravenous contrast. Advanced aortic atherosclerosis. No aneurysm. Multi vessel coronary vascular calcification. Aortic valvular calcification. No significant pericardial effusion Mediastinum/Nodes: Patent  trachea. No suspicious thyroid mass. No suspicious lymph nodes. Esophagus within normal limits Lungs/Pleura: Mild emphysema. Sub solid right apical pulmonary nodule measuring 13 mm on series 3, image 27, previously 13 mm in 2024 and 9 mm in 2023. Posteromedial left upper lobe 4 mm pulmonary nodule on series 3, image 42, no change. Bilateral bronchial wall thickening with minimal mucous plugging at the bases. Upper Abdomen: No acute finding. Small hiatal hernia. Cortical atrophy of the kidneys Musculoskeletal: No acute osseous abnormality. Multilevel degenerative changes IMPRESSION: 1. Mild emphysema. Bilateral bronchial wall thickening with minimal mucous plugging at the bases suggesting airways inflammatory process. No focal consolidative airspace disease 2. 13 mm sub solid pulmonary nodule at the right apex, further follow-up imaging as indicated given patient age. 3. Aortic atherosclerosis. Aortic Atherosclerosis (ICD10-I70.0) and Emphysema (ICD10-J43.9). Electronically Signed   By: Luke Bun M.D.   On: 05/30/2024 21:24   DG Chest 2 View Result Date: 05/30/2024 EXAM: 2 VIEW(S) XRAY OF THE CHEST 05/30/2024 05:31:00 PM COMPARISON: 01/08/2023 CLINICAL HISTORY: cough FINDINGS: LUNGS AND PLEURA: Hyperinflation. No pleural effusion. No pneumothorax. HEART AND MEDIASTINUM: Aortic atherosclerosis. No acute abnormality of the cardiac and mediastinal silhouettes. BONES AND SOFT TISSUES: Thoracic degenerative changes. Old right rib fractures. Intracranial clips in the right upper quadrant. IMPRESSION: 1. No acute findings. 2. Hyperinflation. Electronically signed by: Elsie Gravely MD 05/30/2024 06:28 PM EST RP Workstation: [id number]     Procedures   Medications Ordered in the ED  ipratropium-albuterol  (DUONEB) 0.5-2.5 (3) MG/3ML nebulizer solution 3 mL (3 mLs Nebulization Given 05/30/24 1837)  lactated ringers  bolus 500 mL (500 mLs Intravenous New Bag/Given 05/30/24 1840)  methylPREDNISolone  sodium succinate  (SOLU-MEDROL ) 125 mg/2 mL injection 80 mg (80 mg Intravenous Given 05/30/24 1952)  albuterol  (PROVENTIL ) (2.5 MG/3ML) 0.083% nebulizer solution 2.5 mg (2.5 mg Nebulization Given 05/30/24 2203)  Medical Decision Making Amount and/or Complexity of Data Reviewed Labs: ordered. Radiology: ordered.  Risk Prescription drug management.   This patient presents to the ED for concern of cough, this involves an extensive number of treatment options, and is a complaint that carries with it a high risk of complications and morbidity.  The differential diagnosis includes URI, reactive airway disease exacerbation, bronchitis, pneumonia, CHF, environmental allergen   Co morbidities / Chronic conditions that complicate the patient evaluation  HTN, HLD, DM, CKD, CVA, COPD, GERD   Additional history obtained:  Additional history obtained from EMR External records from outside source obtained and reviewed including patient's daughter   Lab Tests:  I Ordered, and personally interpreted labs.  The pertinent results include: Hemoglobin has mildly decreased when compared to lab work from a year and a half ago.  Microcytosis suggest chronic change.  Creatinine is baseline.  Electrolytes are normal.  BNP is normal when accounting for age.   Imaging Studies ordered:  I ordered imaging studies including chest x-ray, CT chest I independently visualized and interpreted imaging which showed no evidence of pneumonia.  Mild emphysema with bilateral bronchial thickening and mild mucous plugging at bases.  Findings consistent with bronchitis/reactive airway disease I agree with the radiologist interpretation   Cardiac Monitoring: / EKG:  The patient was maintained on a cardiac monitor.  I personally viewed and interpreted the cardiac monitored which showed an underlying rhythm of: Sinus rhythm    Problem List / ED Course / Critical interventions / Medication  management  Patient presenting for worsening cough over the past week.  She does have a history of reactive airway disease but does not use any inhalers or breathing treatments at home.  She has been on doxycycline  for the past 3 days.  With her worsening cough, she has had decreased p.o. intake, increased fatigue and lightheadedness.  Upon arrival in the ED, vital signs notable for widened pulse pressure.  Current SpO2 is normal on room air.  On exam, she does have persistent cough throughout.  Her work of breathing is unlabored.  She is able to speak in complete sentences.  She does have diminished breath sounds in rhonchi on lung auscultation.  Will trial DuoNeb.  Workup was initiated.  Patient's lab work notable for mild decrease in hemoglobin from baseline.  Lab work for comparison is from 1.5 years ago.  She does have a new microcytosis which suggest chronic change.  She underwent x-ray and CT scan of chest which showed findings consistent with reactive airway disease exacerbation/bronchitis.  While in the ED, she did have improved symptoms after breathing treatment.  She had recurrence of her cough after she was laid down for CT scan.  Repeat breathing treatment was ordered.  Throughout her stay in the ED, SpO2 remained normal on room air.  Breathing remains unlabored.  Patient does have a nebulizer at home.  Will prescribe nebulizer medication in addition to ongoing steroids.  Patient to complete her course of doxycycline .  Patient and daughter feel comfortable with this plan.  She was discharged in stable condition. I ordered medication including DuoNeb, albuterol , Solu-Medrol  for reactive airway disease/bronchitis: IV fluids for hydration Reevaluation of the patient after these medicines showed that the patient improved I have reviewed the patients home medicines and have made adjustments as needed  Social Determinants of Health:  Lives independently     Final diagnoses:  Acute cough   Bronchitis    ED Discharge Orders  Ordered    ipratropium-albuterol  (DUONEB) 0.5-2.5 (3) MG/3ML SOLN  Every 4 hours PRN        05/30/24 2205    predniSONE  (STERAPRED UNI-PAK 21 TAB) 10 MG (21) TBPK tablet  Daily        05/30/24 2205               Melvenia Motto, MD 05/30/24 2205  "

## 2024-05-31 ENCOUNTER — Other Ambulatory Visit (HOSPITAL_COMMUNITY): Payer: Self-pay

## 2024-05-31 ENCOUNTER — Encounter (HOSPITAL_BASED_OUTPATIENT_CLINIC_OR_DEPARTMENT_OTHER): Attending: General Surgery | Admitting: General Surgery

## 2024-05-31 ENCOUNTER — Telehealth: Payer: Self-pay | Admitting: *Deleted

## 2024-05-31 DIAGNOSIS — L98419 Non-pressure chronic ulcer of buttock with unspecified severity: Secondary | ICD-10-CM | POA: Diagnosis not present

## 2024-05-31 DIAGNOSIS — Z87891 Personal history of nicotine dependence: Secondary | ICD-10-CM | POA: Insufficient documentation

## 2024-05-31 DIAGNOSIS — E11622 Type 2 diabetes mellitus with other skin ulcer: Secondary | ICD-10-CM | POA: Diagnosis present

## 2024-05-31 NOTE — Telephone Encounter (Signed)
Daughter is aware and referral placed

## 2024-05-31 NOTE — Telephone Encounter (Signed)
 DM added to referral.

## 2024-05-31 NOTE — Telephone Encounter (Signed)
 Copied from CRM [id number]. Topic: General - Other >> May 31, 2024 12:12 PM Aleatha C wrote: Reason for CRM:  nutrition and diabetes wanted to know if Dr  Theophilus can add diabetes to the referral for insurance purposes

## 2024-05-31 NOTE — Addendum Note (Signed)
 Addended by: KATHRYNE MILLMAN B on: 05/31/2024 01:54 PM   Modules accepted: Orders

## 2024-06-03 ENCOUNTER — Other Ambulatory Visit (HOSPITAL_COMMUNITY): Payer: Self-pay

## 2024-06-04 ENCOUNTER — Other Ambulatory Visit (HOSPITAL_COMMUNITY): Payer: Self-pay

## 2024-06-05 ENCOUNTER — Other Ambulatory Visit: Payer: Self-pay

## 2024-06-05 ENCOUNTER — Emergency Department (HOSPITAL_COMMUNITY)
Admission: EM | Admit: 2024-06-05 | Discharge: 2024-06-05 | Disposition: A | Discharge status: Home / Self Care | Attending: Emergency Medicine | Admitting: Emergency Medicine

## 2024-06-05 ENCOUNTER — Emergency Department (HOSPITAL_COMMUNITY)

## 2024-06-05 DIAGNOSIS — J209 Acute bronchitis, unspecified: Secondary | ICD-10-CM | POA: Diagnosis not present

## 2024-06-05 DIAGNOSIS — J449 Chronic obstructive pulmonary disease, unspecified: Secondary | ICD-10-CM | POA: Diagnosis not present

## 2024-06-05 DIAGNOSIS — J40 Bronchitis, not specified as acute or chronic: Secondary | ICD-10-CM

## 2024-06-05 DIAGNOSIS — R051 Acute cough: Secondary | ICD-10-CM

## 2024-06-05 DIAGNOSIS — R059 Cough, unspecified: Secondary | ICD-10-CM | POA: Diagnosis present

## 2024-06-05 LAB — COMPREHENSIVE METABOLIC PANEL WITH GFR
ALT: 23 U/L (ref 0–44)
AST: 24 U/L (ref 15–41)
Albumin: 3.2 g/dL — ABNORMAL LOW (ref 3.5–5.0)
Alkaline Phosphatase: 101 U/L (ref 38–126)
Anion gap: 10 (ref 5–15)
BUN: 38 mg/dL — ABNORMAL HIGH (ref 8–23)
CO2: 24 mmol/L (ref 22–32)
Calcium: 9 mg/dL (ref 8.9–10.3)
Chloride: 102 mmol/L (ref 98–111)
Creatinine, Ser: 1.32 mg/dL — ABNORMAL HIGH (ref 0.44–1.00)
GFR, Estimated: 37 mL/min — ABNORMAL LOW
Glucose, Bld: 97 mg/dL (ref 70–99)
Potassium: 4.2 mmol/L (ref 3.5–5.1)
Sodium: 136 mmol/L (ref 135–145)
Total Bilirubin: 0.4 mg/dL (ref 0.0–1.2)
Total Protein: 5.7 g/dL — ABNORMAL LOW (ref 6.5–8.1)

## 2024-06-05 LAB — CBC WITH DIFFERENTIAL/PLATELET
Abs Immature Granulocytes: 0.07 K/uL (ref 0.00–0.07)
Basophils Absolute: 0 K/uL (ref 0.0–0.1)
Basophils Relative: 0 %
Eosinophils Absolute: 0 K/uL (ref 0.0–0.5)
Eosinophils Relative: 0 %
HCT: 26.3 % — ABNORMAL LOW (ref 36.0–46.0)
Hemoglobin: 8 g/dL — ABNORMAL LOW (ref 12.0–15.0)
Immature Granulocytes: 1 %
Lymphocytes Relative: 10 %
Lymphs Abs: 1.2 K/uL (ref 0.7–4.0)
MCH: 21.3 pg — ABNORMAL LOW (ref 26.0–34.0)
MCHC: 30.4 g/dL (ref 30.0–36.0)
MCV: 70.1 fL — ABNORMAL LOW (ref 80.0–100.0)
Monocytes Absolute: 1.5 K/uL — ABNORMAL HIGH (ref 0.1–1.0)
Monocytes Relative: 13 %
Neutro Abs: 9.4 K/uL — ABNORMAL HIGH (ref 1.7–7.7)
Neutrophils Relative %: 76 %
Platelets: 449 K/uL — ABNORMAL HIGH (ref 150–400)
RBC: 3.75 MIL/uL — ABNORMAL LOW (ref 3.87–5.11)
RDW: 17.1 % — ABNORMAL HIGH (ref 11.5–15.5)
WBC: 12.2 K/uL — ABNORMAL HIGH (ref 4.0–10.5)
nRBC: 0 % (ref 0.0–0.2)

## 2024-06-05 LAB — RESP PANEL BY RT-PCR (RSV, FLU A&B, COVID)  RVPGX2
Influenza A by PCR: NEGATIVE
Influenza B by PCR: NEGATIVE
Resp Syncytial Virus by PCR: NEGATIVE
SARS Coronavirus 2 by RT PCR: NEGATIVE

## 2024-06-05 MED ORDER — MAGNESIUM SULFATE 2 GM/50ML IV SOLN
2.0000 g | Freq: Once | INTRAVENOUS | Status: AC
  Administered 2024-06-05: 2 g via INTRAVENOUS
  Filled 2024-06-05: qty 50

## 2024-06-05 MED ORDER — LEVOFLOXACIN 500 MG PO TABS
500.0000 mg | ORAL_TABLET | Freq: Every day | ORAL | 0 refills | Status: DC
  Filled 2024-06-05 – 2024-06-06 (×2): qty 7, 7d supply, fill #0

## 2024-06-05 MED ORDER — IPRATROPIUM-ALBUTEROL 0.5-2.5 (3) MG/3ML IN SOLN
3.0000 mL | Freq: Once | RESPIRATORY_TRACT | Status: AC
  Administered 2024-06-05: 3 mL via RESPIRATORY_TRACT
  Filled 2024-06-05: qty 3

## 2024-06-05 MED ORDER — METHYLPREDNISOLONE SODIUM SUCC 125 MG IJ SOLR
125.0000 mg | Freq: Once | INTRAMUSCULAR | Status: AC
  Administered 2024-06-05: 125 mg via INTRAVENOUS
  Filled 2024-06-05: qty 2

## 2024-06-05 MED ORDER — LEVOFLOXACIN 500 MG PO TABS
500.0000 mg | ORAL_TABLET | Freq: Once | ORAL | Status: AC
  Administered 2024-06-05: 500 mg via ORAL
  Filled 2024-06-05: qty 1

## 2024-06-05 MED ORDER — ALBUTEROL SULFATE (2.5 MG/3ML) 0.083% IN NEBU
2.5000 mg | INHALATION_SOLUTION | Freq: Once | RESPIRATORY_TRACT | Status: AC
  Administered 2024-06-05: 2.5 mg via RESPIRATORY_TRACT
  Filled 2024-06-05: qty 3

## 2024-06-05 NOTE — ED Provider Notes (Signed)
 " [place] EMERGENCY DEPARTMENT AT [facility] Provider Note   CSN: [id number] Arrival date & time: 06/05/24  1530     Patient presents with: Cough   Stacey Armstrong is a 89 y.o. female.  {Add pertinent medical, surgical, social history, OB history to YEP:67052} Patient returns with continued cough.  She has a history of COPD.  She has been on doxycycline  but continues to cough   Cough      Prior to Admission medications  Medication Sig Start Date End Date Taking? Authorizing Provider  levofloxacin  (LEVAQUIN ) 500 MG tablet Take 1 tablet (500 mg total) by mouth daily. 06/05/24  Yes Suzette Pac, MD  Accu-Chek FastClix Lancets MISC Use one as directed daily to test blood glucose. 01/06/24   Theophilus Andrews, Tully GRADE, MD  amLODipine  (NORVASC ) 10 MG tablet Take 1 tablet (10 mg total) by mouth daily. 11/18/23   Theophilus Andrews, Tully GRADE, MD  aspirin  EC 81 MG tablet Take 81 mg by mouth in the morning.    [provider]  bisoprolol  (ZEBETA ) 5 MG tablet Take 1 tablet (5 mg total) by mouth daily. 11/18/23   Theophilus Andrews, Tully GRADE, MD  Blood Glucose Monitoring Suppl (ONE TOUCH ULTRA 2) w/Device KIT Use to test blood glucose once daily. 01/06/24   Theophilus Andrews, Tully GRADE, MD  Cholecalciferol  (VITAMIN D3) 50 MCG (2000 UT) TABS Take 2,000 Units by mouth in the morning.    [provider]  doxycycline  (VIBRA -TABS) 100 MG tablet Take 1 tablet (100 mg total) by mouth 2 (two) times a day for 10 days. Take with 8 oz water. Do not lie down for at least 30 minutes after. 05/27/24     furosemide  (LASIX ) 20 MG tablet Take 1 tablet (20 mg total) by mouth daily. 01/14/24   Theophilus Andrews, Tully GRADE, MD  glucose blood (ONETOUCH ULTRA BLUE TEST) test strip Use to test once daily. 01/06/24   Theophilus Andrews, Tully GRADE, MD  glucose blood (TRUE METRIX BLOOD GLUCOSE TEST) test strip Use to check blood sugar once daily. 02/10/24   Theophilus Andrews, Tully GRADE, MD   guaiFENesin -dextromethorphan  (ROBITUSSIN DM) 100-10 MG/5ML syrup Take 5 mL by mouth every 12 (twelve) hours for 10 days. 05/27/24     ipratropium (ATROVENT ) 0.03 % nasal spray Place 2 sprays into both nostrils 3 (three) times daily. 05/09/24   Theophilus Andrews, Tully GRADE, MD  ipratropium-albuterol  (DUONEB) 0.5-2.5 (3) MG/3ML SOLN Take 3 mLs by nebulization every 4 (four) hours as needed. 05/30/24 06/05/24  Melvenia Motto, MD  linaclotide  (LINZESS ) 290 MCG CAPS capsule Take 1 capsule (290 mcg total) by mouth daily. 01/14/24   Theophilus Andrews, Tully GRADE, MD  omeprazole  (PRILOSEC) 40 MG capsule Take 1 capsule (40 mg total) by mouth 2 (two) times daily. 02/01/24   Theophilus Andrews, Tully GRADE, MD  Polyethyl Glycol-Propyl Glycol (SYSTANE ULTRA) 0.4-0.3 % SOLN Place 1-2 drops into both eyes 3 (three) times daily as needed (dry/irritated eyes.).    [provider]  polyethylene glycol powder (GLYCOLAX /MIRALAX ) 17 GM/SCOOP powder Take 17 g by mouth 2 (two) times daily. 07/03/22   Rai, Nydia POUR, MD  potassium chloride  SA (KLOR-CON  M) 20 MEQ tablet Take 1 tablet (20 mEq total) by mouth daily. 11/16/23   Theophilus Andrews, Tully GRADE, MD  predniSONE  (DELTASONE ) 10 MG tablet Take 6 tablets by mouth daily  for 2 days, then 5 tabs for 2 days, then 4 tabs for 2 days, then 3 tabs for 2 days, 2  tabs for 2 days, then 1 tab by mouth daily for 2 days 05/30/24   Melvenia Motto, MD  Probiotic Product (PROBIOTIC PO) Take 1 capsule by mouth daily.    [provider]  psyllium (HYDROCIL/METAMUCIL) 95 % PACK Take 1 packet by mouth daily. 07/04/22   Rai, Ripudeep K, MD  Vibegron  (GEMTESA ) 75 MG TABS Take 1 tablet (75 mg total) by mouth daily. 10/22/23       Allergies: Nitrofurantoin, Penicillins, Coconut oil, Other, and Sulfamethoxazole -trimethoprim     Review of Systems  Respiratory:  Positive for cough.     Updated Vital Signs BP (!) 145/47   Pulse 71   Temp 97.6 F (36.4 C) (Oral)   Resp 18   SpO2 99%   Physical  Exam  (all labs ordered are listed, but only abnormal results are displayed) Labs Reviewed  CBC WITH DIFFERENTIAL/PLATELET - Abnormal; Notable for the following components:      Result Value   WBC 12.2 (*)    RBC 3.75 (*)    Hemoglobin 8.0 (*)    HCT 26.3 (*)    MCV 70.1 (*)    MCH 21.3 (*)    RDW 17.1 (*)    Platelets 449 (*)    Neutro Abs 9.4 (*)    Monocytes Absolute 1.5 (*)    All other components within normal limits  COMPREHENSIVE METABOLIC PANEL WITH GFR - Abnormal; Notable for the following components:   BUN 38 (*)    Creatinine, Ser 1.32 (*)    Total Protein 5.7 (*)    Albumin 3.2 (*)    GFR, Estimated 37 (*)    All other components within normal limits  RESP PANEL BY RT-PCR (RSV, FLU A&B, COVID)  [id number]    EKG: None  Radiology: DG Chest 2 View Result Date: 06/05/2024 EXAM: 2 VIEW(S) XRAY OF THE CHEST 06/05/2024 04:51:56 PM COMPARISON: 05/30/2024 CLINICAL HISTORY: sob FINDINGS: LUNGS AND PLEURA: No focal pulmonary opacity. No pleural effusion. No pneumothorax. HEART AND MEDIASTINUM: No acute abnormality of the cardiac and mediastinal silhouettes. Aortic atherosclerosis. BONES AND SOFT TISSUES: No acute osseous abnormality. Thoracic degenerative changes. IMPRESSION: 1. No acute cardiopulmonary abnormality. 2. Aortic atherosclerosis and thoracic degenerative changes. Electronically signed by: Dorethia Molt MD MD 06/05/2024 05:14 PM EST RP Workstation: [id number]    {Document cardiac monitor, telemetry assessment procedure when appropriate:32947} Procedures   Medications Ordered in the ED  levofloxacin  (LEVAQUIN ) tablet 500 mg (has no administration in time range)  ipratropium-albuterol  (DUONEB) 0.5-2.5 (3) MG/3ML nebulizer solution 3 mL (3 mLs Nebulization Given 06/05/24 1731)  albuterol  (PROVENTIL ) (2.5 MG/3ML) 0.083% nebulizer solution 2.5 mg (2.5 mg Nebulization Given 06/05/24 1732)  magnesium  sulfate IVPB 2 g 50 mL (0 g Intravenous Stopped 06/05/24 1831)   methylPREDNISolone  sodium succinate (SOLU-MEDROL ) 125 mg/2 mL injection 125 mg (125 mg Intravenous Given 06/05/24 1727)      {Click here for ABCD2, HEART and other calculators REFRESH Note before signing:1}                              Medical Decision Making Amount and/or Complexity of Data Reviewed Labs: ordered. Radiology: ordered.  Risk Prescription drug management.  Patient with COPD exacerbation and bronchitis.  She improved with treatment in the emergency department.  He is not hypoxic or toxic.  Will try Levaquin  and she will continue the steroids  {Document critical care time when appropriate  Document review of labs and clinical decision  tools ie CHADS2VASC2, etc  Document your independent review of radiology images and any outside records  Document your discussion with family members, caretakers and with consultants  Document social determinants of health affecting pt's care  Document your decision making why or why not admission, treatments were needed:32947:::1}   Final diagnoses:  Acute cough  Bronchitis    ED Discharge Orders          Ordered    levofloxacin  (LEVAQUIN ) 500 MG tablet  Daily        06/05/24 1859             "

## 2024-06-05 NOTE — Discharge Instructions (Addendum)
 Follow-up with pulmonary or your family doctor for recheck.  Return if problems

## 2024-06-05 NOTE — ED Triage Notes (Signed)
 Patient seen here recently for cough, prescribed steroids but still having symptoms.

## 2024-06-06 ENCOUNTER — Other Ambulatory Visit: Payer: Self-pay

## 2024-06-06 ENCOUNTER — Other Ambulatory Visit (HOSPITAL_COMMUNITY): Payer: Self-pay

## 2024-06-07 ENCOUNTER — Encounter: Attending: Internal Medicine | Admitting: Skilled Nursing Facility1

## 2024-06-07 DIAGNOSIS — E119 Type 2 diabetes mellitus without complications: Secondary | ICD-10-CM | POA: Diagnosis present

## 2024-06-07 DIAGNOSIS — N184 Chronic kidney disease, stage 4 (severe): Secondary | ICD-10-CM | POA: Diagnosis present

## 2024-06-07 NOTE — Progress Notes (Unsigned)
 Medical Nutrition Therapy  Appointment Start time:  4:32  Appointment End time:  5:37 Patient is here today with her daughter  Primary concerns today: adequate intake on liquid diet to maintain weight/nutrition.  Daughter is concerned with ehr mothers continued weight loss. Blood glucose 92-123 fasting  Referral diagnosis: esophageal dysphagia, diverticulum of esophagus, moderate protein-calorie malnutrition, type 2 diabetes Preferred learning style: no preference indicated Learning readiness: ready, change in progress   NUTRITION ASSESSMENT  66 146 lbs today at home - she is not drinking enough and also lost weight to cold symptoms over the weekend 158 lbs 3 weeks ago when she was told to stop solid food 130 (06/07/24) per patients daughter  Clinical Medical Hx: HOH, esophageal dysphagia, diverticulum of esophagus (with significant dysmotility and food impaction)- patient and daughter decline feeding tube and now on a liquid diet per MD, moderate protein-calorie malnutrition, type 2 diabetes, HLD, HTN, CKD, GERD, arthritis, OSA Medications: lasix , miralax , psyllium, protonix , vitamin D ,  potassium, MVI, probiotic Labs: GFR 30 01/27/2023,  Notable Signs/Symptoms: HOH, does not like to drink a lot.  Wants to eat real food.  Lifestyle & Dietary Hx Patient lives in independent living at [facility].  They are puree her food with water and her daughter purees the food some with broth or milk. She crushes her med. Pt cannot have coconut Pt cannot mix dairy with meat to keep kosher  Pts daughter spoke for pt today as patient was coughing continuously.  Pt is drinking Premier protein because Ensure does not have enough calories to support wt goals. Pt is experiencing blockages and food is dropping into the pockets and then coming back up.  Pt rejects tube feeding altogether.  Pt does not eat breakfast and can only consume so many liquids. Experiencing bowel issues.  Pt does cheat  occasionally with popcorn, meringue cookies, brisket roast Pt wants to chew. She will chew on foods and then spit it out.  Pt daughter said she is coughing from bronchitis and some food buildup.  Pt was prescribed an antibiotic and no changes in cough. Pt went to ED and stuff will come up but its just clear mucus, no phlegm.  Pt is still walking.  Pt daughter strains and blends her food.  Pt drinks 30 ounces of water per day, 1 premier protein and 1 premier protein with a boost, soup with a spoon. Pt daughter encourages her to drink from a mug but she refuses. Pts daughter expresses financial concern because all high calorie options are so expensive leading to them not being able to afford these supplements.    Supplements: see med list Sleep: sleeps 11 pm until noon but wakes every 2 hours to go to the restroom Stress / self-care: needs assistance with meals.  Patient does her own basic self care. Current average weekly physical activity: walks with a walker  24-Hr Dietary Recall First Meal: 4-5 oz pot roast with potatoes and carrots liquefied with beef broth OR beet borsch with sour cream Snack: Ensure (150 calories) or yogurt Second Meal:   Snack: skips Third Meal: none (usually eats meal from [facility] at this time (liquids or thinned pureed food) Snack: none Beverages: water, coffee with splenda  Estimated Energy Needs Calories: 1300-1400 Protein: 65g    NUTRITION INTERVENTION  Nutrition education (E-1) on the following topics:  Importance of adherence to a liquid diet.  Physiology of swallowing and anatomy of human GI. Explained the dangers behind consuming foods that are not liquid when  a pt has dysphagia.  Importance of a swallow screen. High fat additions to meals to help increase calories to slow weight loss and monitor protein.   Handouts Provided Include  Ways to increase calories in liquid foods  Learning Style & Readiness for Change Teaching method utilized:  Visual & Auditory  Barriers to learning/adherence to lifestyle change: HOH, displeasure in liquid diet with a  preference to chew foods  Goals Established by Pt Try [employer] and Soylent as supplements. Look in [employer] or search online for other affordable high calorie options.  Add oil such as olive oil to lower calorie supplements Add dry whole milk powder to lower calorie supplements Add soymilk or pea milk ([place]) to supplements to increase their calories  Avoid pureed foods and follow physician order for an all liquid diet as this may be causing/worsening her cough reducing her overall energy and with reduced energy lacking the strength to cough up the substances    MONITORING & EVALUATION Dietary intake, weight maintenance, following a liquid diet.   Next Steps  Patient is to call for questions.

## 2024-06-08 ENCOUNTER — Encounter: Payer: Self-pay | Admitting: Skilled Nursing Facility1

## 2024-06-15 ENCOUNTER — Encounter: Payer: Self-pay | Admitting: Pulmonary Disease

## 2024-06-15 ENCOUNTER — Other Ambulatory Visit (HOSPITAL_COMMUNITY): Payer: Self-pay

## 2024-06-15 ENCOUNTER — Ambulatory Visit: Admitting: Pulmonary Disease

## 2024-06-15 VITALS — BP 129/52 | HR 62 | Ht 62.0 in | Wt 128.0 lb

## 2024-06-15 DIAGNOSIS — G4733 Obstructive sleep apnea (adult) (pediatric): Secondary | ICD-10-CM

## 2024-06-15 DIAGNOSIS — R053 Chronic cough: Secondary | ICD-10-CM

## 2024-06-15 DIAGNOSIS — Z87891 Personal history of nicotine dependence: Secondary | ICD-10-CM

## 2024-06-15 DIAGNOSIS — J441 Chronic obstructive pulmonary disease with (acute) exacerbation: Secondary | ICD-10-CM

## 2024-06-15 DIAGNOSIS — R0602 Shortness of breath: Secondary | ICD-10-CM

## 2024-06-15 DIAGNOSIS — R0683 Snoring: Secondary | ICD-10-CM

## 2024-06-15 DIAGNOSIS — R5383 Other fatigue: Secondary | ICD-10-CM

## 2024-06-15 MED ORDER — IPRATROPIUM-ALBUTEROL 0.5-2.5 (3) MG/3ML IN SOLN
3.0000 mL | Freq: Four times a day (QID) | RESPIRATORY_TRACT | 3 refills | Status: AC | PRN
  Filled 2024-06-15 (×2): qty 180, 15d supply, fill #0

## 2024-06-15 MED ORDER — BUDESONIDE 0.5 MG/2ML IN SUSP
0.5000 mg | Freq: Two times a day (BID) | RESPIRATORY_TRACT | 11 refills | Status: AC
  Filled 2024-06-15 (×2): qty 120, 30d supply, fill #0

## 2024-06-15 NOTE — Patient Instructions (Signed)
 Schedule pulmonary function tests at the front desk   Start budesonide  nebulizer twice daily  Use duoneb nebulizer twice daily  We will schedule you for home sleep study  Follow up in 3 months, call sooner if needed

## 2024-06-15 NOTE — Progress Notes (Unsigned)
 "  New Patient Pulmonology Office Visit   Subjective:  Patient ID: Stacey Armstrong, female    DOB: 06/04/1927  MRN: [id number]  Referred by: Theophilus Andrews, Tully GRADE, MD  CC:  Chief Complaint  Patient presents with   Consult    Pt states has been to UC a few time, she has GI issues and is on all liquid diet x lifetime , they think she may have aspirated into her lungs , would like to talk about if Cpap would be useful has some breathing issues at night    Discussed the use of AI scribe software for clinical note transcription with the patient, who gave verbal consent to proceed.  History of Present Illness Stacey Armstrong is a 89 year old female who presents with cough and breathing difficulties. She is accompanied by her daughter, who is her primary caregiver.  She has had a persistent productive cough with clear mucus and increased shortness of breath for two weeks. She notes heavy breathing with walking. A recent course of steroids and doxycycline  gave minimal relief. She has not used her inhaler but is using nebulizer treatments. Her cough has decreased over the last two days, but she has constant rhinorrhea.  She has bronchitis and esophageal motility problems with prior concern for aspiration. She is a former smoker who quit in 1998 after smoking since adolescence. Her daughter reports her apartment is very dusty.  She has leg swelling, worse in the left leg, which was significantly swollen in the past week and made it hard to put on shoes. She reports no new swelling today.  She was diagnosed with sleep apnea over 20 years ago and previously used CPAP. Her daughter notes she wakes every two hours at night to urinate.  She takes reflux medication, which she feels helps her symptoms somewhat. She denies sinus congestion or drainage but reports a constantly runny nose.        Review of Systems  Constitutional:  Negative for chills, fever, malaise/fatigue and weight loss.  HENT:   Negative for congestion, sinus pain and sore throat.   Eyes: Negative.   Respiratory:  Positive for cough, sputum production and shortness of breath. Negative for hemoptysis and wheezing.   Cardiovascular:  Negative for chest pain, palpitations, orthopnea, claudication and leg swelling.  Gastrointestinal:  Negative for abdominal pain, heartburn, nausea and vomiting.  Genitourinary: Negative.   Musculoskeletal:  Negative for joint pain and myalgias.  Skin:  Negative for rash.  Neurological:  Negative for weakness.  Endo/Heme/Allergies: Negative.   Psychiatric/Behavioral: Negative.      Allergies: Nitrofurantoin, Penicillins, Coconut oil, Other, and Sulfamethoxazole -trimethoprim  Current Medications[1] Past Medical History:  Diagnosis Date   Arthritis    Bronchitis    Complication of anesthesia    after gallbladder surgery with extubation had shallow respirations,  needed to continue intudation for additonal 10-12 days   Diabetes (HCC)    DVT (deep venous thrombosis) (HCC)    left leg after hysterectomy   GERD (gastroesophageal reflux disease)    History of COVID-19 05/2021   History of kidney stones    History of transient ischemic attack (TIA)    HOH (hard of hearing)    Hyperlipidemia    Hypertension    Kidney stones 2023   Sleep apnea    Past Surgical History:  Procedure Laterality Date   ABDOMINAL HYSTERECTOMY     APPENDECTOMY     CATARACT EXTRACTION, BILATERAL     CHOLECYSTECTOMY     COLONOSCOPY  CYSTOSCOPY WITH STENT PLACEMENT Right 08/14/2021   Procedure: CYSTOSCOPY WITH STENT PLACEMENT;  Surgeon: Cam Morene ORN, MD;  Location: [facility];  Service: Urology;  Laterality: Right;  ONLY NEEDS 30 MIN   EXTRACORPOREAL SHOCK WAVE LITHOTRIPSY Right 02/06/2022   Procedure: RIGHT EXTRACORPOREAL SHOCK WAVE LITHOTRIPSY (ESWL);  Surgeon: Cam Morene ORN, MD;  [facility]: [facility];  Service: Urology;  Laterality: Right;   Family History  Problem Relation  Age of Onset   CAD Maternal Grandmother    Heart disease Maternal Grandmother    Pneumonia Maternal Grandfather    Liver disease Neg Hx    Colon cancer Neg Hx    Esophageal cancer Neg Hx    Rectal cancer Neg Hx    Stomach cancer Neg Hx    Social History   Socioeconomic History   Marital status: Widowed    Spouse name: Not on file   Number of children: 4   Years of education: Not on file   Highest education level: Not on file  Occupational History   Occupation: retred  Tobacco Use   Smoking status: Former   Smokeless tobacco: Never  Vaping Use   Vaping status: Never Used  Substance and Sexual Activity   Alcohol  use: Not Currently   Drug use: Never   Sexual activity: Not Currently    Birth control/protection: Surgical  Other Topics Concern   Not on file  Social History Narrative   Not on file   Social Drivers of Health   Tobacco Use: Medium Risk (06/15/2024)   Patient History    Smoking Tobacco Use: Former    Smokeless Tobacco Use: Never    Passive Exposure: Not on file  Financial Resource Strain: Low Risk (01/29/2024)   Overall Financial Resource Strain (CARDIA)    Difficulty of Paying Living Expenses: Not hard at all  Food Insecurity: No Food Insecurity (01/29/2024)   Epic    Worried About Radiation Protection Practitioner of Food in the Last Year: Never true    Ran Out of Food in the Last Year: Never true  Transportation Needs: No Transportation Needs (01/29/2024)   Epic    Lack of Transportation (Medical): No    Lack of Transportation (Non-Medical): No  Physical Activity: Inactive (01/29/2024)   Exercise Vital Sign    Days of Exercise per Week: 0 days    Minutes of Exercise per Session: 0 min  Stress: No Stress Concern Present (01/29/2024)   [employer] of Occupational Health - Occupational Stress Questionnaire    Feeling of Stress: Not at all  Social Connections: Moderately Integrated (01/29/2024)   Social Connection and Isolation Panel    Frequency of Communication with Friends  and Family: More than three times a week    Frequency of Social Gatherings with Friends and Family: More than three times a week    Attends Religious Services: More than 4 times per year    Active Member of [employer] or Organizations: Yes    Attends Banker Meetings: More than 4 times per year    Marital Status: Widowed  Intimate Partner Violence: Not At Risk (01/29/2024)   Epic    Fear of Current or Ex-Partner: No    Emotionally Abused: No    Physically Abused: No    Sexually Abused: No  Depression (PHQ2-9): Low Risk (01/29/2024)   Depression (PHQ2-9)    PHQ-2 Score: 0  Alcohol  Screen: Low Risk (01/29/2024)   Alcohol  Screen    Last Alcohol  Screening Score (AUDIT): 0  Housing: Unknown (03/10/2024)   Received from [facility]   Epic    Unable to Pay for Housing in the Last Year: Not on file    Number of Times Moved in the Last Year: Not on file    At any time in the past 12 months, were you homeless or living in a shelter (including now)?: No  Utilities: Not At Risk (01/29/2024)   Epic    Threatened with loss of utilities: No  Health Literacy: Adequate Health Literacy (01/29/2024)   B1300 Health Literacy    Frequency of need for help with medical instructions: Never       Objective:  BP (!) 129/52   Pulse 62   Ht 5' 2 (1.575 m) Comment: per pt  Wt 128 lb (58.1 kg)   SpO2 96%   BMI 23.41 kg/m    Physical Exam Constitutional:      General: She is not in acute distress.    Appearance: Normal appearance.  Eyes:     General: No scleral icterus.    Conjunctiva/sclera: Conjunctivae normal.  Cardiovascular:     Rate and Rhythm: Normal rate and regular rhythm.  Pulmonary:     Breath sounds: No wheezing, rhonchi or rales.  Musculoskeletal:     Right lower leg: Edema present.     Left lower leg: Edema present.  Skin:    General: Skin is warm and dry.  Neurological:     General: No focal deficit present.     Diagnostic Review:  Last CBC Lab  Results  Component Value Date   WBC 12.2 (H) 06/05/2024   HGB 8.0 (L) 06/05/2024   HCT 26.3 (L) 06/05/2024   MCV 70.1 (L) 06/05/2024   MCH 21.3 (L) 06/05/2024   RDW 17.1 (H) 06/05/2024   PLT 449 (H) 06/05/2024   Last metabolic panel Lab Results  Component Value Date   GLUCOSE 97 06/05/2024   NA 136 06/05/2024   K 4.2 06/05/2024   CL 102 06/05/2024   CO2 24 06/05/2024   BUN 38 (H) 06/05/2024   CREATININE 1.32 (H) 06/05/2024   GFRNONAA 37 (L) 06/05/2024   CALCIUM  9.0 06/05/2024   PHOS 3.0 08/22/2021   PROT 5.7 (L) 06/05/2024   ALBUMIN 3.2 (L) 06/05/2024   BILITOT 0.4 06/05/2024   ALKPHOS 101 06/05/2024   AST 24 06/05/2024   ALT 23 06/05/2024   ANIONGAP 10 06/05/2024   CT Chest 05/30/24 1. Mild emphysema. Bilateral bronchial wall thickening with minimal mucous plugging at the bases suggesting airways inflammatory process. No focal consolidative airspace disease 2. 13 mm sub solid pulmonary nodule at the right apex, further follow-up imaging as indicated given patient age. 3. Aortic atherosclerosis.     Assessment & Plan:   Assessment & Plan Chronic cough     Shortness of breath  Orders:   budesonide  (PULMICORT ) 0.5 MG/2ML nebulizer solution; Take 2 mLs (0.5 mg total) by nebulization 2 (two) times daily.   ipratropium-albuterol  (DUONEB) 0.5-2.5 (3) MG/3ML SOLN; Inhale 3 mLs by nebulization every 6 (six) hours as needed.   Spirometry with graph; Future  Snoring  Orders:   Home sleep test; Future  Fatigue, unspecified type  Orders:   Home sleep test; Future   Assessment and Plan Assessment & Plan Chronic obstructive pulmonary disease COPD exacerbation with minimal relief from recent steroid and doxycycline  treatment. Smoking history and esophageal motility issues increase risk for aspiration and reflux. - Continue DuoNeb nebulizer treatments twice daily. - Added budesonide   nebulizer treatment twice daily. - Scheduled spirometry to assess lung  function. - Continue follow-up with gastroenterology for esophageal motility issues. - Advised to remain upright for 2-3 hours postprandially to reduce aspiration risk.  Obstructive sleep apnea Long-standing OSA with non-compliance to CPAP. Symptoms of nocturia and daytime fatigue may relate to OSA or chronic respiratory issues. - Ordered home sleep study to evaluate current sleep apnea status. - If sleep apnea is confirmed, will consider CPAP therapy. - If sleep apnea is not confirmed, will consider blood gas analysis to assess for chronic respiratory issues.      Return in about 3 months (around 09/13/2024) for f/u visit Dr. Kara.   Dorn KATHEE Kara, MD     [1]  Current Outpatient Medications:    Accu-Chek FastClix Lancets MISC, Use one as directed daily to test blood glucose., Disp: 102 each, Rfl: 3   amLODipine  (NORVASC ) 10 MG tablet, Take 1 tablet (10 mg total) by mouth daily., Disp: 90 tablet, Rfl: 1   aspirin  EC 81 MG tablet, Take 81 mg by mouth in the morning., Disp: , Rfl:    bisoprolol  (ZEBETA ) 5 MG tablet, Take 1 tablet (5 mg total) by mouth daily., Disp: 90 tablet, Rfl: 1   Blood Glucose Monitoring Suppl (ONE TOUCH ULTRA 2) w/Device KIT, Use to test blood glucose once daily., Disp: 1 kit, Rfl: 0   budesonide  (PULMICORT ) 0.5 MG/2ML nebulizer solution, Take 2 mLs (0.5 mg total) by nebulization 2 (two) times daily., Disp: 120 mL, Rfl: 11   Cholecalciferol  (VITAMIN D3) 50 MCG (2000 UT) TABS, Take 2,000 Units by mouth in the morning., Disp: , Rfl:    furosemide  (LASIX ) 20 MG tablet, Take 1 tablet (20 mg total) by mouth daily., Disp: 90 tablet, Rfl: 1   glucose blood (ONETOUCH ULTRA BLUE TEST) test strip, Use to test once daily., Disp: 100 each, Rfl: 3   ipratropium (ATROVENT ) 0.03 % nasal spray, Place 2 sprays into both nostrils 3 (three) times daily., Disp: 30 mL, Rfl: 0   linaclotide  (LINZESS ) 290 MCG CAPS capsule, Take 1 capsule (290 mcg total) by mouth daily., Disp: 90  capsule, Rfl: 1   omeprazole  (PRILOSEC) 40 MG capsule, Take 1 capsule (40 mg total) by mouth 2 (two) times daily., Disp: 180 capsule, Rfl: 1   Polyethyl Glycol-Propyl Glycol (SYSTANE ULTRA) 0.4-0.3 % SOLN, Place 1-2 drops into both eyes 3 (three) times daily as needed (dry/irritated eyes.)., Disp: , Rfl:    polyethylene glycol powder (GLYCOLAX /MIRALAX ) 17 GM/SCOOP powder, Take 17 g by mouth 2 (two) times daily., Disp: 3350 g, Rfl: 1   potassium chloride  SA (KLOR-CON  M) 20 MEQ tablet, Take 1 tablet (20 mEq total) by mouth daily., Disp: 90 tablet, Rfl: 3   Probiotic Product (PROBIOTIC PO), Take 1 capsule by mouth daily., Disp: , Rfl:    psyllium (HYDROCIL/METAMUCIL) 95 % PACK, Take 1 packet by mouth daily., Disp: 240 each, Rfl: 3   Vibegron  (GEMTESA ) 75 MG TABS, Take 1 tablet (75 mg total) by mouth daily., Disp: 90 tablet, Rfl: 3   guaiFENesin -dextromethorphan  (ROBITUSSIN DM) 100-10 MG/5ML syrup, Take 5 mL by mouth every 12 (twelve) hours for 10 days. (Patient not taking: Reported on 06/15/2024), Disp: 118 mL, Rfl: 0   ipratropium-albuterol  (DUONEB) 0.5-2.5 (3) MG/3ML SOLN, Inhale 3 mLs by nebulization every 6 (six) hours as needed., Disp: 180 mL, Rfl: 3  "

## 2024-06-16 ENCOUNTER — Other Ambulatory Visit: Payer: Self-pay

## 2024-06-16 ENCOUNTER — Encounter: Payer: Self-pay | Admitting: Pulmonary Disease

## 2024-06-17 ENCOUNTER — Other Ambulatory Visit (HOSPITAL_COMMUNITY): Payer: Self-pay

## 2024-06-21 ENCOUNTER — Ambulatory Visit: Admitting: Pulmonary Disease

## 2024-09-07 ENCOUNTER — Ambulatory Visit: Admitting: Pulmonary Disease

## 2025-02-03 ENCOUNTER — Ambulatory Visit
